# Patient Record
Sex: Female | Born: 1935 | Race: Black or African American | Hispanic: No | Smoking: Never smoker
Health system: Southern US, Community
[De-identification: ages and names within clinical notes are randomized; demographics above are authoritative.]

## PROBLEM LIST (undated history)

## (undated) DIAGNOSIS — I10 Essential (primary) hypertension: Secondary | ICD-10-CM

## (undated) DIAGNOSIS — D0510 Intraductal carcinoma in situ of unspecified breast: Principal | ICD-10-CM

## (undated) DIAGNOSIS — R011 Cardiac murmur, unspecified: Secondary | ICD-10-CM

## (undated) DIAGNOSIS — I4891 Unspecified atrial fibrillation: Secondary | ICD-10-CM

## (undated) DIAGNOSIS — E785 Hyperlipidemia, unspecified: Secondary | ICD-10-CM

## (undated) DIAGNOSIS — K922 Gastrointestinal hemorrhage, unspecified: Secondary | ICD-10-CM

## (undated) DIAGNOSIS — K8020 Calculus of gallbladder without cholecystitis without obstruction: Secondary | ICD-10-CM

## (undated) DIAGNOSIS — I509 Heart failure, unspecified: Secondary | ICD-10-CM

## (undated) DIAGNOSIS — I82409 Acute embolism and thrombosis of unspecified deep veins of unspecified lower extremity: Secondary | ICD-10-CM

## (undated) DIAGNOSIS — C50919 Malignant neoplasm of unspecified site of unspecified female breast: Secondary | ICD-10-CM

## (undated) DIAGNOSIS — R55 Syncope and collapse: Secondary | ICD-10-CM

## (undated) DIAGNOSIS — G629 Polyneuropathy, unspecified: Secondary | ICD-10-CM

## (undated) DIAGNOSIS — K219 Gastro-esophageal reflux disease without esophagitis: Secondary | ICD-10-CM

## (undated) DIAGNOSIS — D649 Anemia, unspecified: Secondary | ICD-10-CM

## (undated) DIAGNOSIS — IMO0001 Reserved for inherently not codable concepts without codable children: Secondary | ICD-10-CM

## (undated) DIAGNOSIS — M1990 Unspecified osteoarthritis, unspecified site: Secondary | ICD-10-CM

## (undated) HISTORY — DX: Cardiac murmur, unspecified: R01.1

## (undated) HISTORY — PX: BREAST SURGERY: SHX581

## (undated) HISTORY — DX: Intraductal carcinoma in situ of unspecified breast: D05.10

## (undated) HISTORY — DX: Anemia, unspecified: D64.9

## (undated) HISTORY — DX: Malignant neoplasm of unspecified site of unspecified female breast: C50.919

## (undated) HISTORY — DX: Gastrointestinal hemorrhage, unspecified: K92.2

## (undated) HISTORY — DX: Calculus of gallbladder without cholecystitis without obstruction: K80.20

---

## 1998-05-15 ENCOUNTER — Ambulatory Visit (HOSPITAL_COMMUNITY): Admission: RE | Admit: 1998-05-15 | Discharge: 1998-05-15 | Payer: Self-pay | Admitting: *Deleted

## 1998-12-27 ENCOUNTER — Ambulatory Visit (HOSPITAL_COMMUNITY): Admission: RE | Admit: 1998-12-27 | Discharge: 1998-12-27 | Payer: Self-pay | Admitting: *Deleted

## 1999-02-06 ENCOUNTER — Other Ambulatory Visit: Admission: RE | Admit: 1999-02-06 | Discharge: 1999-02-06 | Payer: Self-pay | Admitting: Obstetrics and Gynecology

## 2000-05-20 ENCOUNTER — Encounter: Payer: Self-pay | Admitting: *Deleted

## 2000-05-20 ENCOUNTER — Ambulatory Visit (HOSPITAL_COMMUNITY): Admission: RE | Admit: 2000-05-20 | Discharge: 2000-05-20 | Payer: Self-pay | Admitting: *Deleted

## 2001-05-28 ENCOUNTER — Ambulatory Visit (HOSPITAL_COMMUNITY): Admission: RE | Admit: 2001-05-28 | Discharge: 2001-05-28 | Payer: Self-pay | Admitting: *Deleted

## 2001-05-28 ENCOUNTER — Encounter: Payer: Self-pay | Admitting: *Deleted

## 2001-09-06 ENCOUNTER — Other Ambulatory Visit: Admission: RE | Admit: 2001-09-06 | Discharge: 2001-09-06 | Payer: Self-pay | Admitting: Obstetrics and Gynecology

## 2002-02-02 ENCOUNTER — Ambulatory Visit (HOSPITAL_COMMUNITY): Admission: RE | Admit: 2002-02-02 | Discharge: 2002-02-02 | Payer: Self-pay | Admitting: *Deleted

## 2002-05-31 ENCOUNTER — Encounter: Payer: Self-pay | Admitting: *Deleted

## 2002-05-31 ENCOUNTER — Ambulatory Visit (HOSPITAL_COMMUNITY): Admission: RE | Admit: 2002-05-31 | Discharge: 2002-05-31 | Payer: Self-pay | Admitting: *Deleted

## 2002-09-07 ENCOUNTER — Other Ambulatory Visit: Admission: RE | Admit: 2002-09-07 | Discharge: 2002-09-07 | Payer: Self-pay | Admitting: Obstetrics and Gynecology

## 2002-12-16 ENCOUNTER — Emergency Department (HOSPITAL_COMMUNITY): Admission: EM | Admit: 2002-12-16 | Discharge: 2002-12-17 | Payer: Self-pay | Admitting: Emergency Medicine

## 2002-12-16 ENCOUNTER — Encounter: Payer: Self-pay | Admitting: Emergency Medicine

## 2002-12-20 ENCOUNTER — Ambulatory Visit (HOSPITAL_COMMUNITY): Admission: RE | Admit: 2002-12-20 | Discharge: 2002-12-20 | Payer: Self-pay | Admitting: *Deleted

## 2003-06-15 ENCOUNTER — Ambulatory Visit (HOSPITAL_COMMUNITY): Admission: RE | Admit: 2003-06-15 | Discharge: 2003-06-15 | Payer: Self-pay | Admitting: *Deleted

## 2003-06-15 ENCOUNTER — Encounter: Payer: Self-pay | Admitting: *Deleted

## 2004-06-17 ENCOUNTER — Ambulatory Visit (HOSPITAL_COMMUNITY): Admission: RE | Admit: 2004-06-17 | Discharge: 2004-06-17 | Payer: Self-pay | Admitting: *Deleted

## 2004-11-26 ENCOUNTER — Encounter: Admission: RE | Admit: 2004-11-26 | Discharge: 2004-11-26 | Payer: Self-pay | Admitting: Cardiology

## 2005-01-23 ENCOUNTER — Ambulatory Visit (HOSPITAL_COMMUNITY): Admission: RE | Admit: 2005-01-23 | Discharge: 2005-01-23 | Payer: Self-pay | Admitting: Cardiology

## 2005-06-19 ENCOUNTER — Ambulatory Visit (HOSPITAL_COMMUNITY): Admission: RE | Admit: 2005-06-19 | Discharge: 2005-06-19 | Payer: Self-pay | Admitting: *Deleted

## 2005-09-29 ENCOUNTER — Encounter: Admission: RE | Admit: 2005-09-29 | Discharge: 2005-12-28 | Payer: Self-pay | Admitting: Cardiology

## 2006-01-12 ENCOUNTER — Encounter: Admission: RE | Admit: 2006-01-12 | Discharge: 2006-04-12 | Payer: Self-pay | Admitting: Cardiology

## 2006-06-24 ENCOUNTER — Ambulatory Visit (HOSPITAL_COMMUNITY): Admission: RE | Admit: 2006-06-24 | Discharge: 2006-06-24 | Payer: Self-pay | Admitting: Cardiology

## 2007-06-28 ENCOUNTER — Ambulatory Visit (HOSPITAL_COMMUNITY): Admission: RE | Admit: 2007-06-28 | Discharge: 2007-06-28 | Payer: Self-pay | Admitting: Cardiology

## 2007-09-24 ENCOUNTER — Ambulatory Visit: Payer: Self-pay | Admitting: Internal Medicine

## 2007-09-27 ENCOUNTER — Ambulatory Visit: Payer: Self-pay | Admitting: Vascular Surgery

## 2007-09-27 ENCOUNTER — Encounter (INDEPENDENT_AMBULATORY_CARE_PROVIDER_SITE_OTHER): Payer: Self-pay | Admitting: Cardiology

## 2007-09-27 ENCOUNTER — Ambulatory Visit (HOSPITAL_COMMUNITY): Admission: RE | Admit: 2007-09-27 | Discharge: 2007-09-27 | Payer: Self-pay | Admitting: Cardiology

## 2007-10-13 ENCOUNTER — Ambulatory Visit: Payer: Self-pay | Admitting: Internal Medicine

## 2007-12-24 ENCOUNTER — Emergency Department (HOSPITAL_COMMUNITY): Admission: EM | Admit: 2007-12-24 | Discharge: 2007-12-24 | Payer: Self-pay | Admitting: *Deleted

## 2007-12-26 ENCOUNTER — Emergency Department (HOSPITAL_COMMUNITY): Admission: EM | Admit: 2007-12-26 | Discharge: 2007-12-26 | Payer: Self-pay | Admitting: Emergency Medicine

## 2008-01-22 ENCOUNTER — Emergency Department (HOSPITAL_COMMUNITY): Admission: EM | Admit: 2008-01-22 | Discharge: 2008-01-22 | Payer: Self-pay | Admitting: Family Medicine

## 2008-02-11 ENCOUNTER — Ambulatory Visit: Payer: Self-pay | Admitting: Internal Medicine

## 2008-06-28 ENCOUNTER — Ambulatory Visit (HOSPITAL_COMMUNITY): Admission: RE | Admit: 2008-06-28 | Discharge: 2008-06-28 | Payer: Self-pay | Admitting: Cardiology

## 2009-02-09 ENCOUNTER — Ambulatory Visit: Payer: Self-pay | Admitting: Internal Medicine

## 2009-06-29 ENCOUNTER — Ambulatory Visit (HOSPITAL_COMMUNITY): Admission: RE | Admit: 2009-06-29 | Discharge: 2009-06-29 | Payer: Self-pay | Admitting: Cardiology

## 2010-02-08 DIAGNOSIS — I482 Chronic atrial fibrillation, unspecified: Secondary | ICD-10-CM

## 2010-02-08 DIAGNOSIS — E7800 Pure hypercholesterolemia, unspecified: Secondary | ICD-10-CM

## 2010-02-08 DIAGNOSIS — I119 Hypertensive heart disease without heart failure: Secondary | ICD-10-CM

## 2010-02-08 DIAGNOSIS — I340 Nonrheumatic mitral (valve) insufficiency: Secondary | ICD-10-CM

## 2010-02-11 ENCOUNTER — Ambulatory Visit: Payer: Self-pay | Admitting: Internal Medicine

## 2010-07-01 ENCOUNTER — Ambulatory Visit (HOSPITAL_COMMUNITY): Admission: RE | Admit: 2010-07-01 | Discharge: 2010-07-01 | Payer: Self-pay | Admitting: Cardiology

## 2010-07-03 ENCOUNTER — Encounter: Admission: RE | Admit: 2010-07-03 | Discharge: 2010-07-03 | Payer: Self-pay | Admitting: Cardiology

## 2010-07-04 ENCOUNTER — Encounter: Admission: RE | Admit: 2010-07-04 | Discharge: 2010-07-04 | Payer: Self-pay | Admitting: Cardiology

## 2010-07-11 ENCOUNTER — Encounter: Admission: RE | Admit: 2010-07-11 | Discharge: 2010-07-11 | Payer: Self-pay | Admitting: Cardiology

## 2010-07-15 ENCOUNTER — Ambulatory Visit (HOSPITAL_COMMUNITY): Admission: RE | Admit: 2010-07-15 | Discharge: 2010-07-15 | Payer: Self-pay | Admitting: General Surgery

## 2010-07-15 ENCOUNTER — Encounter: Admission: RE | Admit: 2010-07-15 | Discharge: 2010-07-15 | Payer: Self-pay | Admitting: General Surgery

## 2010-07-15 DIAGNOSIS — D0510 Intraductal carcinoma in situ of unspecified breast: Secondary | ICD-10-CM

## 2010-07-15 HISTORY — DX: Intraductal carcinoma in situ of unspecified breast: D05.10

## 2010-07-18 ENCOUNTER — Ambulatory Visit: Payer: Self-pay | Admitting: Oncology

## 2010-08-06 ENCOUNTER — Ambulatory Visit: Admission: RE | Admit: 2010-08-06 | Discharge: 2010-10-09 | Payer: Self-pay | Admitting: Radiation Oncology

## 2010-08-13 ENCOUNTER — Encounter: Admission: RE | Admit: 2010-08-13 | Discharge: 2010-08-13 | Payer: Self-pay | Admitting: Radiation Oncology

## 2010-10-17 ENCOUNTER — Ambulatory Visit: Payer: Self-pay | Admitting: Oncology

## 2010-10-21 LAB — CBC WITH DIFFERENTIAL/PLATELET
BASO%: 0.4 % (ref 0.0–2.0)
EOS%: 4.3 % (ref 0.0–7.0)
HCT: 36.5 % (ref 34.8–46.6)
HGB: 12.6 g/dL (ref 11.6–15.9)
MCV: 97.1 fL (ref 79.5–101.0)
MONO#: 1 10*3/uL — ABNORMAL HIGH (ref 0.1–0.9)
NEUT#: 2.9 10*3/uL (ref 1.5–6.5)
Platelets: 158 10*3/uL (ref 145–400)
WBC: 5.5 10*3/uL (ref 3.9–10.3)
lymph#: 1.4 10*3/uL (ref 0.9–3.3)

## 2010-10-21 LAB — COMPREHENSIVE METABOLIC PANEL
ALT: 9 U/L (ref 0–35)
AST: 20 U/L (ref 0–37)
Alkaline Phosphatase: 96 U/L (ref 39–117)
CO2: 28 mEq/L (ref 19–32)
Chloride: 100 mEq/L (ref 96–112)
Potassium: 4 mEq/L (ref 3.5–5.3)
Sodium: 137 mEq/L (ref 135–145)
Total Bilirubin: 0.8 mg/dL (ref 0.3–1.2)
Total Protein: 7.5 g/dL (ref 6.0–8.3)

## 2010-11-13 ENCOUNTER — Observation Stay (HOSPITAL_COMMUNITY)
Admission: EM | Admit: 2010-11-13 | Discharge: 2010-11-14 | Payer: Self-pay | Source: Home / Self Care | Admitting: Emergency Medicine

## 2010-11-14 ENCOUNTER — Encounter (INDEPENDENT_AMBULATORY_CARE_PROVIDER_SITE_OTHER): Payer: Self-pay | Admitting: Emergency Medicine

## 2010-11-20 ENCOUNTER — Ambulatory Visit: Payer: Self-pay | Admitting: Oncology

## 2010-11-20 LAB — COMPREHENSIVE METABOLIC PANEL
ALT: 11 U/L (ref 0–35)
AST: 20 U/L (ref 0–37)
Albumin: 4 g/dL (ref 3.5–5.2)
Calcium: 9.2 mg/dL (ref 8.4–10.5)
Sodium: 139 mEq/L (ref 135–145)
Total Protein: 7.5 g/dL (ref 6.0–8.3)

## 2010-11-20 LAB — CBC WITH DIFFERENTIAL/PLATELET
BASO%: 0.4 % (ref 0.0–2.0)
Basophils Absolute: 0 10*3/uL (ref 0.0–0.1)
EOS%: 3.6 % (ref 0.0–7.0)
LYMPH%: 41 % (ref 14.0–49.7)
MONO#: 0.7 10*3/uL (ref 0.1–0.9)
MONO%: 10.9 % (ref 0.0–14.0)
NEUT#: 2.8 10*3/uL (ref 1.5–6.5)
RBC: 3.81 10*6/uL (ref 3.70–5.45)
RDW: 12.3 % (ref 11.2–14.5)
lymph#: 2.6 10*3/uL (ref 0.9–3.3)

## 2010-12-08 DIAGNOSIS — C50919 Malignant neoplasm of unspecified site of unspecified female breast: Secondary | ICD-10-CM

## 2010-12-08 HISTORY — DX: Malignant neoplasm of unspecified site of unspecified female breast: C50.919

## 2010-12-18 LAB — COMPREHENSIVE METABOLIC PANEL
ALT: 10 U/L (ref 0–35)
AST: 18 U/L (ref 0–37)
Albumin: 3.8 g/dL (ref 3.5–5.2)
Alkaline Phosphatase: 72 U/L (ref 39–117)
BUN: 10 mg/dL (ref 6–23)
CO2: 27 mEq/L (ref 19–32)
Calcium: 8.6 mg/dL (ref 8.4–10.5)
Chloride: 100 mEq/L (ref 96–112)
Creatinine, Ser: 0.95 mg/dL (ref 0.40–1.20)
Glucose, Bld: 218 mg/dL — ABNORMAL HIGH (ref 70–99)
Potassium: 4 mEq/L (ref 3.5–5.3)
Sodium: 138 mEq/L (ref 135–145)
Total Bilirubin: 0.9 mg/dL (ref 0.3–1.2)
Total Protein: 7.4 g/dL (ref 6.0–8.3)

## 2010-12-18 LAB — CBC WITH DIFFERENTIAL/PLATELET
BASO%: 0.3 % (ref 0.0–2.0)
Basophils Absolute: 0 10*3/uL (ref 0.0–0.1)
EOS%: 4.3 % (ref 0.0–7.0)
Eosinophils Absolute: 0.3 10*3/uL (ref 0.0–0.5)
HCT: 35.8 % (ref 34.8–46.6)
HGB: 11.8 g/dL (ref 11.6–15.9)
LYMPH%: 26 % (ref 14.0–49.7)
MCH: 32.4 pg (ref 25.1–34.0)
MCHC: 33 g/dL (ref 31.5–36.0)
MCV: 98 fL (ref 79.5–101.0)
MONO#: 0.7 10*3/uL (ref 0.1–0.9)
MONO%: 11.4 % (ref 0.0–14.0)
NEUT#: 3.6 10*3/uL (ref 1.5–6.5)
NEUT%: 58 % (ref 38.4–76.8)
Platelets: 152 10*3/uL (ref 145–400)
RBC: 3.65 10*6/uL — ABNORMAL LOW (ref 3.70–5.45)
RDW: 12.7 % (ref 11.2–14.5)
WBC: 6.3 10*3/uL (ref 3.9–10.3)
lymph#: 1.6 10*3/uL (ref 0.9–3.3)

## 2010-12-31 ENCOUNTER — Ambulatory Visit: Payer: Self-pay | Admitting: Oncology

## 2011-01-09 NOTE — Assessment & Plan Note (Signed)
Summary: 1 YR F/U  Medications Added FUROSEMIDE 40 MG TABS (FUROSEMIDE) Take one tablet by mouth two times a day EXFORGE 5-320 MG TABS (AMLODIPINE BESYLATE-VALSARTAN) Take one tablet by mouth once daily. ATENOLOL 50 MG TABS (ATENOLOL) Take one tablet by mouth once daily. SIMVASTATIN 40 MG TABS (SIMVASTATIN) Take one tablet by mouth daily at bedtime WARFARIN SODIUM 2 MG TABS (WARFARIN SODIUM) Use as directed by [facility]tico[facility]ualtion Clinic POTASSIUM CHLORIDE CRYS CR 20 MEQ CR-TABS (POTASSIUM CHLORIDE CRYS CR) Take one tablet by mouth twice a day ASPIRIN EC 325 MG TBEC (ASPIRIN) Take one tablet by mouth daily CELEBREX 200 MG CAPS (CELECOXIB) 1 capsule once daily MULTIVITAMINS   TABS (MULTIPLE VITAMIN) Take one tablet by mouth once daily. LANOXIN 0.125 MG TABS (DIGOXIN) one by mouth daily      Allergies Added: NKDA  Visit Type:  Follow-up Primary Provider:  Donia Guiles, MD   History of Present Illness: [name] returns today for followup.  She is a very pleasant 77 year old woman with chronic atrial fibrillation and hypertension, and morbid obesity.  She returns today for followup.  In the interim, she has been stable, and has lost 8 lbs.  Her heart failure has been well controlled.  She has rare palpitations. She denies chest pain. No peripheral edema.   Current Medications (verified): 1)  Furosemide 40 Mg Tabs (Furosemide) .... Take One Tablet By Mouth Two Times A Day 2)  Exforge 5-320 Mg Tabs (Amlodipine Besylate-Valsartan) .... Take One Tablet By Mouth Once Daily. 3)  Atenolol 50 Mg Tabs (Atenolol) .... Take One Tablet By Mouth Once Daily. 4)  Simvastatin 40 Mg Tabs (Simvastatin) .... Take One Tablet By Mouth Daily At Bedtime 5)  Warfarin Sodium 2 Mg Tabs (Warfarin Sodium) .... Use As Directed By Anticoagualtion Clinic 6)  Potassium Chloride Crys Cr 20 Meq Cr-Tabs (Potassium Chloride Crys Cr) .... Take One Tablet By Mouth Twice A Day 7)  Aspirin Ec 325 Mg Tbec (Aspirin) .... Take  One Tablet By Mouth Daily 8)  Celebrex 200 Mg Caps (Celecoxib) .Marland Kitchen.. 1 Capsule Once Daily 9)  Multivitamins   Tabs (Multiple Vitamin) .... Take One Tablet By Mouth Once Daily. 10)  Lanoxin 0.125 Mg Tabs (Digoxin) .... One By Mouth Daily  Allergies (verified): No Known Drug Allergies  Past History:  Past Medical History: Last updated: 02/08/2010  Chronic atrial fibrillation   hypertension   heart murmur   hypercholesterolemia  Review of Systems  The patient denies chest pain, syncope, dyspnea on exertion, and peripheral edema.    Vital Signs:  Patient profile:   77 year old female Height:      63 inches Weight:      247 pounds BMI:     43.91 Pulse rate:   55 / minute BP sitting:   110 / 68  (left arm)  Vitals Entered By: Laurance Flatten CMA (February 11, 2010 10:59 AM)  Physical Exam  General:  Obese, well developed, well nourished, in no acute distress.  HEENT: normal Neck: supple. No JVD. Carotids 2+ bilaterally no bruits Cor: RRR no rubs, gallops or murmur Lungs: CTA Ab: soft, nontender. nondistended. No HSM. Good bowel sounds Ext: warm. no cyanosis, clubbing or edema Neuro: alert and oriented. Grossly nonfocal. affect pleasant    EKG  Procedure date:  02/11/2010  Findings:      Atrial fibrillation with a controlled ventricular response rate of: 55.  Impression & Recommendations:  Problem # 1:  ATRIAL FIBRILLATION, CHRONIC (ICD-427.31) Her symptoms  are well controlled.  Continue current meds. Her updated medication list for this problem includes:    Atenolol 50 Mg Tabs (Atenolol) .Marland Kitchen... Take one tablet by mouth once daily.    Warfarin Sodium 2 Mg Tabs (Warfarin sodium) ..... Use as directed by anticoagualtion clinic    Aspirin Ec 325 Mg Tbec (Aspirin) .Marland Kitchen... Take one tablet by mouth daily    Lanoxin 0.125 Mg Tabs (Digoxin) ..... One by mouth daily  Orders: EKG w/ Interpretation (93000)  Problem # 2:  HYPERTENSION (ICD-401.9) In addition to her current meds,  she will maintain a low sodium diet. Her updated medication list for this problem includes:    Furosemide 40 Mg Tabs (Furosemide) .Marland Kitchen... Take one tablet by mouth two times a day    Exforge 5-320 Mg Tabs (Amlodipine besylate-valsartan) .Marland Kitchen... Take one tablet by mouth once daily.    Atenolol 50 Mg Tabs (Atenolol) .Marland Kitchen... Take one tablet by mouth once daily.    Aspirin Ec 325 Mg Tbec (Aspirin) .Marland Kitchen... Take one tablet by mouth daily  Problem # 3:  HYPERCHOLESTEROLEMIA (ICD-272.0) Continue statin therapy and maintain a low fat diet. Her updated medication list for this problem includes:    Simvastatin 40 Mg Tabs (Simvastatin) .Marland Kitchen... Take one tablet by mouth daily at bedtime  Patient Instructions: 1)  Your physician recommends that you schedule a follow-up appointment in: 12 months with Dr Ladona Ridgel Prescriptions: LANOXIN 0.125 MG TABS (DIGOXIN) one by mouth daily  #30 x 11   Entered by:   Dennis Bast, RN, BSN   Authorized by:   Laren Boom, MD, [facility]   Signed by:   Dennis Bast, RN, BSN on 02/11/2010   Method used:   Faxed to ...       [facility]retail)       [address]       [place], [place]  [address]       Ph: [phone]       Fax: [phone]   RxID:   [id number] ATENOLOL 50 MG TABS (ATENOLOL) Take one tablet by mouth once daily.  #30 x 11   Entered by:   Dennis Bast, RN, BSN   Authorized by:   Laren Boom, MD, [facility]   Signed by:   Dennis Bast, RN, BSN on 02/11/2010   Method used:   Faxed to ...       [facility]ail[facility]       [address]       [place], [place]  [address]       Ph: [phone]       Fax: [phone]   RxID:   [id number]

## 2011-02-17 LAB — CBC
HCT: 37.1 % (ref 36.0–46.0)
MCH: 32.6 pg (ref 26.0–34.0)
MCHC: 33.7 g/dL (ref 30.0–36.0)
Platelets: 151 10*3/uL (ref 150–400)
RDW: 11.8 % (ref 11.5–15.5)

## 2011-02-17 LAB — APTT: aPTT: 31 seconds (ref 24–37)

## 2011-02-17 LAB — POCT I-STAT, CHEM 8
Calcium, Ion: 1.05 mmol/L — ABNORMAL LOW (ref 1.12–1.32)
Hemoglobin: 13.6 g/dL (ref 12.0–15.0)
Sodium: 143 mEq/L (ref 135–145)
TCO2: 30 mmol/L (ref 0–100)

## 2011-02-17 LAB — PROTIME-INR
INR: 1.43 (ref 0.00–1.49)
Prothrombin Time: 17.6 seconds — ABNORMAL HIGH (ref 11.6–15.2)

## 2011-02-17 LAB — DIFFERENTIAL
Basophils Absolute: 0 10*3/uL (ref 0.0–0.1)
Basophils Relative: 0 % (ref 0–1)
Lymphocytes Relative: 46 % (ref 12–46)
Neutro Abs: 2.6 10*3/uL (ref 1.7–7.7)
Neutrophils Relative %: 38 % — ABNORMAL LOW (ref 43–77)

## 2011-02-17 LAB — POCT CARDIAC MARKERS: Myoglobin, poc: 89 ng/mL (ref 12–200)

## 2011-02-21 LAB — GLUCOSE, CAPILLARY
Glucose-Capillary: 155 mg/dL — ABNORMAL HIGH (ref 70–99)
Glucose-Capillary: 177 mg/dL — ABNORMAL HIGH (ref 70–99)
Glucose-Capillary: 184 mg/dL — ABNORMAL HIGH (ref 70–99)

## 2011-02-21 LAB — CBC
HCT: 39.9 % (ref 36.0–46.0)
MCH: 31.9 pg (ref 26.0–34.0)
MCV: 96.4 fL (ref 78.0–100.0)
Platelets: 149 10*3/uL — ABNORMAL LOW (ref 150–400)
RDW: 12.1 % (ref 11.5–15.5)

## 2011-02-21 LAB — BASIC METABOLIC PANEL
Calcium: 8.8 mg/dL (ref 8.4–10.5)
GFR calc Af Amer: >60 mL/min (ref >60)
GFR calc non Af Amer: 54 mL/min — ABNORMAL LOW (ref >60)
Glucose, Bld: 221 mg/dL — ABNORMAL HIGH (ref 70–99)
Potassium: 4.2 mEq/L (ref 3.5–5.1)
Sodium: 136 mEq/L (ref 135–145)

## 2011-02-21 LAB — APTT: aPTT: 30 seconds (ref 24–37)

## 2011-02-21 LAB — SURGICAL PCR SCREEN: Staphylococcus aureus: NEGATIVE

## 2011-04-22 NOTE — Letter (Signed)
September 24, 2007    Osvaldo Shipper. Spruill, M.D.  [address] [address]  [place], [place] [address]   RE:  ELONA, YINGER  MRN:  [id number]  /  DOB:  1926/12/08   Dear Kevin Fenton,   Thank you for referring Ms. Bernadene Person for EP evaluation of her  atrial fibrillation.  As you know, she is a very pleasant widowed 76-  year-old woman with a history of obesity, hypertension, diabetes, and  recent diagnosis of atrial fibrillation.  She, despite her atrial  fibrillation, has had a fairly rapid ventricular response.  Despite  this, she has been relatively asymptomatic.  She states that when she  exerts herself, particularly when she climbs up stairs, she gets short  of breath.  If she is in a hurry, she gets short of breath but otherwise  no specific complaints today.  I suspect she is fairly sedentary.  I do  not have her records today, but is sounds like she was tried on Coumadin  but had some problems either with compliance or persistently high INRs,  and this medication was discontinued.  She is presently on furosemide 40  twice daily which has helped her sensation of dyspnea, but she is not on  beta blockers.  She had been on Celebrex which was discontinued.  She is  on ibuprofen.  Her exam was unremarkable except that she was obese.  She  had mild peripheral edema.  Her blood pressure was significantly  elevated at 177/86.  Her ventricular rate was 140 beats per minute in  atrial fibrillation at rest.   Kevin Fenton, I discussed treatment options with Ms. Traeger.  Ideally, she  would be able to tolerate Coumadin, although it is not clear to me what  exactly happened.  I wonder if we should reconsider another trial of  Coumadin on this patient; however, until I speak with you, I will plan  on starting her on aspirin 325 a day.  With regard to her rate control,  I think she needs digoxin and metoprolol, and I have given her a  prescription for metoprolol and digoxin today.  My plan is  to see her back  in 2-3 weeks to see how her ventricular rate is in  atrial fibrillation.  If it is approved and she feels better, then we  will continue as we are.  If she is still symptomatic, then we will try  additional up-titrations of her beta blocker and digoxin.  We will also  want to revisit the question of Coumadin.    Sincerely,      Doylene Canning. Ladona Ridgel, MD  Electronically Signed    GWT/MedQ  DD: 09/24/2007  DT: 09/25/2007  Job #: [id number]

## 2011-04-22 NOTE — Assessment & Plan Note (Signed)
[facility]                         ELECTROPHYSIOLOGY OFFICE NOTE   [name], [name]                    MRN:          [id number]  DATE:02/09/2009                            DOB:          [birth year]    [name] returns today for followup.  She is a very pleasant 77-year-  old woman with chronic atrial fibrillation and hypertension, and morbid  obesity.  She returns today for followup.  In the interim, she has been  stable, but she expresses frustration about inability to lose weight.  Her heart failure has been well controlled.  She has rare palpitations.  She denies chest pain.   MEDICATIONS:  1. Exforge 5/320 daily.  2. Potassium 40 twice a day.  3. Coumadin as directed.  4. Aspirin 325 a day.  5. Digoxin 0.125 daily.  6. Potassium 20 twice a day.  7. Simvastatin 40 a day.  8. Atenolol 50 a day.  9. Celebrex 200 a day.   PHYSICAL EXAMINATION:  GENERAL:  She is a pleasant but obese elderly  woman in no distress.  VITAL SIGNS:  Blood pressure was 130/60, the pulse was 60 and irregular,  respirations were 18, her weight was 253 pounds.  NECK:  No jugular venous distention.  LUNGS:  Clear bilaterally to auscultation.  No wheezes, rales, or  rhonchi are present.  There is no increased work of breathing.  CARDIOVASCULAR:  Irregular rate and rhythm.  Normal S1 and S2.  I did  not appreciate any murmurs today.  PMI was enlarged but not laterally  displaced.  ABDOMEN:  Obese, nontender, nondistended.  There is no organomegaly.  Bowel sounds are present.  There is no rebound or guarding.  EXTREMITIES:  No cyanosis, clubbing, or edema.  Pulses were 2+ and  symmetric.   EKG demonstrates atrial fibrillation with controlled ventricular  response.   IMPRESSION:  1. Chronic atrial fibrillation.  2. Hypertension.  3. Morbid obesity.  4. Dyslipidemia.   DISCUSSION:  [name] is stable today from arrhythmia perspective.  She has no  symptoms with her AFib.  Her blood pressure has been well  controlled.  She will continue on  chronic Coumadin therapy for thromboembolic prevention.  She will  continue on her statin therapy for control her lipids.  We will see her  back in the office for followup in 1 year.     Doylene Canning. Ladona Ridgel, MD  Electronically Signed    GWT/MedQ  DD: 02/09/2009  DT: 02/10/2009  Job #: [id number]   cc:   Osvaldo Shipper. Spruill, M.D.

## 2011-04-22 NOTE — Assessment & Plan Note (Signed)
[facility]                         ELECTROPHYSIOLOGY OFFICE NOTE   EVAMAE, ROWEN                    MRN:          [id number]  DATE:02/11/2008                            DOB:          [birth year]    Ms. Scheck returns today for follow-up.  She is a very pleasant, 77  year old woman with a history of atrial fibrillation now well-controlled  with rate controlling drugs, who also has heart failure who returns  today for follow-up.  When I saw her initially back in October 2008 she  was suffering from rapid A fib  and congestive heart failure symptoms  secondary to this.  At that time we started on AV nodal blocking drugs  include metoprolol and digoxin and she is markedly improved.  She denies  chest pain or shortness of breath.  She has very minimal palpitations.  She has had no syncope or near syncope and overall is improved on exam.  Her medications include simvastatin 40 a day, potassium 20 twice daily,  digoxin 0.125 daily, metoprolol ER 50 a day, Coumadin as directed,  aspirin 325 a day, furosemide 40 twice a day.   On physical exam she is a pleasant elderly-appearing woman in no acute  distress.  Blood pressure was 141/86, pulse 64 and regular, respirations  were 18.  Weight was 256 pounds NECK:  Revealed no jugular venous  distention.  LUNGS:  Clear bilaterally to auscultation.  No wheezes, rales or rhonchi  are present.  CARDIOVASCULAR:  Demonstrate irregular rate and rhythm  with normal S1-S2.  EXTREMITIES:  Demonstrated no edema.   EKG demonstrates atrial fibrillation with a slow ventricular response of  55 beats per minute.   IMPRESSION:  1. Chronic A fib  2. Rate control with beta blockers and digoxin.  3. Chronic Coumadin therapy  4. Hypertension.   DISCUSSION:  Overall Ms. Luth is stable and her ventricular rate and  A fib is well-controlled.  I recommend continued rate control for the  patient, continued Coumadin  therapy and blood pressure control.  I will  see her back in 1 year.     Doylene Canning. Ladona Ridgel, MD  Electronically Signed    GWT/MedQ  DD: 02/11/2008  DT: 02/12/2008  Job #: [id number]   cc:   Osvaldo Shipper. Spruill, M.D.

## 2011-04-22 NOTE — Assessment & Plan Note (Signed)
[facility]                         ELECTROPHYSIOLOGY OFFICE NOTE   EVANNY, ELLERBE                    MRN:          [id number]  DATE:09/24/2007                            DOB:          1926-07-11    Ms. Ackerley is referred today by Dr. Donia Guiles for evaluation of  atrial fibrillation with a rapid ventricular response.  The patient is a  very pleasant 77 year old woman with multiple medical problems including  diet controlled diabetes, hypertension, and dyslipidemia, who was found  to be in atrial fibrillation several weeks ago.  She was initially  placed on Coumadin, but it sounds like she was not having good success  with control of her INR, and ultimately Coumadin was discontinued.  The  patient has not been on any beta blockers, at least as best I can tell.  She notes that when she is resting or doing something very sedentary in  activity or strenuousness, she feels fine but when she does anything  strenuous or exerts herself, she has very difficult problems with  shortness of breath, palpitations.  Otherwise, she had no specific  complaints except for very mild peripheral edema.  She has chronic  arthritic complaints as well and is on nonsteroidals for this.   FAMILY HISTORY:  Noncontributory at her advanced age.   SOCIAL HISTORY:  The patient denies tobacco use.  She has remote alcohol  use.  She is retired.  She is widowed.   REVIEW OF SYSTEMS:  As noted in the HPI.  There is a question of  decreased visual acuity but otherwise all systems reviewed and found to  be negative.   PHYSICAL EXAMINATION:  She is a pleasant, obese 77 year old woman in no  distress.  The blood pressure was 177/86; the pulse 78 and irregularly irregular  (12-lead EKG rate was 140 beats per minute); the respirations were 16;  weight was 256 pounds.  HEENT:  Normocephalic, atraumatic.  Pupils equal and round.  The  oropharynx is moist, the sclerae  anicteric.  NECK:  Revealed 8 to 9 cm of jugular venous distention.  No thyromegaly.  LUNGS:  Clear bilaterally to auscultation.  No wheezes, rales, or  rhonchi are present.  CARDIOVASCULAR:  Irregularly irregular tachycardia with normal S1 and  S2.  The heart sounds were somewhat distant.  I could not appreciate any  murmurs or rubs.  ABDOMEN:  Obese, nontender, nondistended.  There is no organomegaly.  The bowel sounds are present.  There is no rebound or guarding.  EXTREMITIES:  No cyanosis, clubbing, or edema.  The pulses were 2+ and  symmetric.  NEUROLOGIC:  Alert and oriented x3 with cranial nerves intact.  The  strength was 5/5 and symmetric.   The EKG demonstrates atrial fibrillation with a rapid ventricular  response at 140 beats per minute.   IMPRESSION:  1. Persistent atrial fibrillation with rapid ventricular response.  2. Hypertension.  3. Diabetes.  4. Dyslipidemia.   DISCUSSION:  I have discussed treatment options with the patient in  detail.  I recommended a strategy of rate control and to this end I have  given her prescriptions for Toprol as well as digoxin today.  I  initially thought Coumadin would be most appropriate.  It sounds like  she has been tried on Coumadin and not been very therapeutic with her  INRs.  I will have to find out more from Dr. Shana Chute regarding this.  In  the short term, I have given her a prescription for full strength  aspirin that I would like her to take, and I have asked her to cut back  on her ibuprofen intake.  We will see her back in several weeks to see  how her ventricular response is doing in atrial fibrillation.     Doylene Canning. Ladona Ridgel, MD  Electronically Signed    GWT/MedQ  DD: 09/24/2007  DT: 09/25/2007  Job #: [id number]   cc:   Osvaldo Shipper. Spruill, M.D.

## 2011-04-22 NOTE — Assessment & Plan Note (Signed)
[facility]                         ELECTROPHYSIOLOGY OFFICE NOTE   Diane, Dominguez                    MRN:          [id number]  DATE:10/13/2007                            DOB:          September 15, 1926    [name] returns today for follow-up.  I saw her several weeks ago  on referral from Verona O. Spruill, M.D., for atrial fibrillation with a  rapid ventricular response.  The patient is a very pleasant elderly  woman with a history of hypertension and her only symptom initially was  that she would get tired easily.  She denied palpitations or shortness  of breath.  The patient was begun on metoprolol long-acting 50 mg daily  along with digoxin 0.125 mg daily, and she returns today for follow-up.  I should also note that she was started back on Coumadin.  Overall she  notes that her fatigue and tiredness has markedly improved.  She has had  no palpitations.  She denies syncope.  She denies chest pain or  shortness of breath.   PHYSICAL EXAMINATION:  She is a pleasant, elderly-appearing woman in no  acute distress.  The blood pressure today was 130/87, the pulse was 53 and irregularly  irregular, respirations were 18.  NECK:  No jugular venous distention.  LUNGS:  Clear bilaterally to auscultation.  No wheezes, rales or rhonchi  were present.  CARDIOVASCULAR:  Ann irregularly irregular rhythm with normal S1 and S2.  EXTREMITIES:  No edema.   IMPRESSION:  1. Persistent atrial fibrillation.  2. Hypertension.  3. Coumadin therapy.   DISCUSSION:  [name] ventricular rate is much improved today  compared to when she was seen by me in the office 3 weeks ago.  I have  recommended that she continue on her present medical therapy with beta  blockers and digoxin and I think it would be important that she stay on  Coumadin if at all possible.  Her symptoms of atrial fibrillation have  resolved completely.  I would not recommend antiarrhythmic  drug therapy  at this time.  She will continue her beta blocker and digoxin.  We will  see her back in several months.     Doylene Canning. Ladona Ridgel, MD  Electronically Signed    GWT/MedQ  DD: 10/13/2007  DT: 10/14/2007  Job #: [id number]   cc:   Osvaldo Shipper. Spruill, M.D.

## 2011-04-25 NOTE — Op Note (Signed)
NAME:  [name], [name]                       ACCOUNT NO.:  [id number]   MEDICAL RECORD NO.:  [id number]                   PATIENT TYPE:  AMB   LOCATION:  ENDO                                 FACILITY:  [facility]   PHYSICIAN:  Sharyn Dross., M.D.               DATE OF BIRTH:  1926-03-03   DATE OF PROCEDURE:  12/20/2002  DATE OF DISCHARGE:                                 OPERATIVE REPORT   PREOPERATIVE DIAGNOSES:  1. Epigastric and distal esophageal pain.  2. Elevated amylase level and an abnormal x-ray consistent with gallstones.   MEDICATIONS:  Demerol 40 mg IV, Versed 4 mg IV over a 10-minute period of  time.   INSTRUMENT USED:  Olympus video panendoscope.   ENDOSCOPIST:  Dortha Kern, M.D.   CLINICAL NOTE:  See the accompanying note for the history and physical.   INFORMED CONSENT:  The patient was advised of the procedure, the  indications, and the risks involved.  She has agreed to have the procedure  performed after viewing the video at this time.   PREPROCEDURE PREPARATION:  The patient was brought into the [facility]  endoscopy unit, where the IV for IV sedating medications was started.  A  monitor was placed on the patient to monitor the patient's vital signs and  oxygen saturation.  Nasal oxygen at 2 L/min. was used and after adequate  sedation was performed, the procedure as begun.   DESCRIPTION OF PROCEDURE:  The instrument was advanced with the patient  lying in the left lateral position via the direct technique without  difficulty.  The oropharyngeal, epiglottis, vocal cords, and piriform  sinuses appeared to be grossly within normal limits.  The esophagus showed  no evidence of acute inflammation or ulcerations in the proximal and  midportions of the esophagus at this time.  However, in the distal  esophagus, mid-lower esophagus sphincter area, there was evidence of a  Mallory-Weiss tear with evidence of old blood  on scarring that was noted.  A photograph was  attempted to be taken; however, upon reviewing the  photographs, this did not appear well at all.  There was no evidence of any  active bleeding that was present in the area at this time.  The size of the  scar appeared to be approximately 1-2 cm in length at this time.   The gastric area showed a normal mucous lake without any evidence of acute  inflammation or ulcerations that was noted.  The antral area appeared to be  unremarkable without any major acute inflammatory changes or ulcerative  changes that was appreciated.  The pylorus was normal and upon advancing  through the pyloric canal, the duodenal bulb showed evidence of an active  duodenal ulcer that was present with a small amount of residual old heme  that was present.  There was no active bleeding present and no evidence of  any visible vessel that was  noted within the ulcer crater that was  appreciated.  The ulcer size appeared to be about 1 cm in size at this point  without any major deformities of the duodenal bulb (except for the ulcer  crater) that was noted.  The second portion of the duodenum appeared to be  otherwise unremarkable at this time.  There was no evidence of any  stimulation of bleeding or irritation that was present at this time.   The instrument was retracted back, where a retroflexed view of the cardia  showed no evidence of a hiatal hernia that was noted at this time.  The  instrument was then advanced back to the antral area, where a biopsy for the  CLO study was performed.  No biopsies were taken of the duodenal ulcer that  was noted.  The instrument was gradually retracted back, where the Z-line  appeared to be 40 cm distal esophagus at this time.  Again the instrument  was eased back into the esophagus, where there was evidence of old blood  that was present, and a photograph was taken of this without any active  bleeding noted.  Again the area where the Mallory-Weiss tear was not well-  noted  because the area contracted down.  The instrument was gradually  removed with the patient having some intolerance to the procedure but not  enough resistance to force her to pull the instrument out at this time.  The  patient tolerated the procedure overall well without any known major  discomfort to this point.   TREATMENT:  1. Presently I am going to await the results of the CLO study at this time.  2. I am going to start the patient on a PPI at this time.  3. I am going to have her follow up with me in approximately two weeks for a     re-evaluation in the office and will discuss this with her at this point.     Depending on these results will determine the course of therapy.                                               Sharyn Dross., M.D.    JM/MEDQ  D:  12/20/2002  T:  12/20/2002  Job:  [id number]

## 2011-05-19 ENCOUNTER — Other Ambulatory Visit (HOSPITAL_COMMUNITY): Payer: Self-pay | Admitting: Cardiology

## 2011-06-06 ENCOUNTER — Ambulatory Visit (HOSPITAL_COMMUNITY)
Admission: RE | Admit: 2011-06-06 | Discharge: 2011-06-06 | Payer: Self-pay | Source: Ambulatory Visit | Attending: Cardiology | Admitting: Cardiology

## 2011-06-06 ENCOUNTER — Encounter (HOSPITAL_COMMUNITY)
Admission: RE | Admit: 2011-06-06 | Discharge: 2011-06-06 | Disposition: A | Discharge status: Home / Self Care | Payer: Medicare Other | Source: Ambulatory Visit | Attending: Cardiology | Admitting: Cardiology

## 2011-06-06 ENCOUNTER — Ambulatory Visit (HOSPITAL_COMMUNITY): Payer: Self-pay

## 2011-06-06 DIAGNOSIS — I4891 Unspecified atrial fibrillation: Secondary | ICD-10-CM | POA: Insufficient documentation

## 2011-06-06 DIAGNOSIS — R079 Chest pain, unspecified: Secondary | ICD-10-CM | POA: Insufficient documentation

## 2011-06-06 DIAGNOSIS — Z09 Encounter for follow-up examination after completed treatment for conditions other than malignant neoplasm: Secondary | ICD-10-CM | POA: Insufficient documentation

## 2011-06-06 MED ORDER — TECHNETIUM TC 99M TETROFOSMIN IV KIT
30.0000 | PACK | Freq: Once | INTRAVENOUS | Status: AC | PRN
  Administered 2011-06-06: 30 via INTRAVENOUS

## 2011-06-06 MED ORDER — TECHNETIUM TC 99M TETROFOSMIN IV KIT
10.0000 | PACK | Freq: Once | INTRAVENOUS | Status: AC | PRN
  Administered 2011-06-06: 10 via INTRAVENOUS

## 2011-06-19 ENCOUNTER — Encounter (HOSPITAL_BASED_OUTPATIENT_CLINIC_OR_DEPARTMENT_OTHER): Payer: Medicare Other | Admitting: Oncology

## 2011-06-19 ENCOUNTER — Other Ambulatory Visit: Payer: Self-pay | Admitting: Oncology

## 2011-06-19 DIAGNOSIS — D0590 Unspecified type of carcinoma in situ of unspecified breast: Secondary | ICD-10-CM

## 2011-06-19 DIAGNOSIS — M129 Arthropathy, unspecified: Secondary | ICD-10-CM

## 2011-06-19 LAB — CBC WITH DIFFERENTIAL/PLATELET
BASO%: 0.5 % (ref 0.0–2.0)
EOS%: 3.4 % (ref 0.0–7.0)
HGB: 11.9 g/dL (ref 11.6–15.9)
MCH: 33.2 pg (ref 25.1–34.0)
MCHC: 34.1 g/dL (ref 31.5–36.0)
MCV: 97.2 fL (ref 79.5–101.0)
MONO%: 12.2 % (ref 0.0–14.0)
RBC: 3.6 10*6/uL — ABNORMAL LOW (ref 3.70–5.45)
RDW: 12.4 % (ref 11.2–14.5)
lymph#: 2.1 10*3/uL (ref 0.9–3.3)

## 2011-06-19 LAB — COMPREHENSIVE METABOLIC PANEL
ALT: 13 U/L (ref 0–35)
AST: 22 U/L (ref 0–37)
Albumin: 4 g/dL (ref 3.5–5.2)
Alkaline Phosphatase: 85 U/L (ref 39–117)
BUN: 13 mg/dL (ref 6–23)
Calcium: 9.4 mg/dL (ref 8.4–10.5)
Chloride: 103 mEq/L (ref 96–112)
Potassium: 4.9 mEq/L (ref 3.5–5.3)
Sodium: 141 mEq/L (ref 135–145)
Total Protein: 7.6 g/dL (ref 6.0–8.3)

## 2011-06-23 ENCOUNTER — Other Ambulatory Visit: Payer: Self-pay | Admitting: Oncology

## 2011-06-23 DIAGNOSIS — Z9889 Other specified postprocedural states: Secondary | ICD-10-CM

## 2011-07-04 ENCOUNTER — Ambulatory Visit
Admission: RE | Admit: 2011-07-04 | Discharge: 2011-07-04 | Disposition: A | Discharge status: Home / Self Care | Payer: Medicare Other | Source: Ambulatory Visit | Attending: Oncology | Admitting: Oncology

## 2011-07-04 DIAGNOSIS — Z9889 Other specified postprocedural states: Secondary | ICD-10-CM

## 2011-08-28 LAB — BASIC METABOLIC PANEL
BUN: 17
Creatinine, Ser: 1.12
GFR calc non Af Amer: 47 — ABNORMAL LOW
Glucose, Bld: 89
Potassium: 4.7

## 2011-08-28 LAB — PROTIME-INR
INR: 1.3
Prothrombin Time: 16.2 — ABNORMAL HIGH

## 2011-08-28 LAB — DIFFERENTIAL
Basophils Absolute: 0
Basophils Relative: 0
Eosinophils Absolute: 0.2
Eosinophils Relative: 1
Neutrophils Relative %: 71

## 2011-08-28 LAB — CBC
HCT: 38
Platelets: 190
RDW: 12.9

## 2011-11-13 ENCOUNTER — Telehealth: Payer: Self-pay | Admitting: Oncology

## 2011-11-13 NOTE — Telephone Encounter (Signed)
called pt and scheduled appt for [phone]

## 2012-01-12 ENCOUNTER — Other Ambulatory Visit: Payer: Self-pay | Admitting: Cardiology

## 2012-01-12 ENCOUNTER — Other Ambulatory Visit: Payer: Self-pay | Admitting: Oncology

## 2012-02-06 ENCOUNTER — Ambulatory Visit (HOSPITAL_BASED_OUTPATIENT_CLINIC_OR_DEPARTMENT_OTHER): Payer: Medicare Other | Admitting: Oncology

## 2012-02-06 ENCOUNTER — Encounter: Payer: Self-pay | Admitting: Oncology

## 2012-02-06 ENCOUNTER — Other Ambulatory Visit: Payer: Medicare Other | Admitting: Lab

## 2012-02-06 DIAGNOSIS — E559 Vitamin D deficiency, unspecified: Secondary | ICD-10-CM

## 2012-02-06 DIAGNOSIS — M129 Arthropathy, unspecified: Secondary | ICD-10-CM

## 2012-02-06 DIAGNOSIS — D0510 Intraductal carcinoma in situ of unspecified breast: Secondary | ICD-10-CM

## 2012-02-06 DIAGNOSIS — D0590 Unspecified type of carcinoma in situ of unspecified breast: Secondary | ICD-10-CM

## 2012-02-06 LAB — CBC WITH DIFFERENTIAL/PLATELET
BASO%: 0.4 % (ref 0.0–2.0)
Basophils Absolute: 0 10*3/uL (ref 0.0–0.1)
EOS%: 4.3 % (ref 0.0–7.0)
HGB: 11.7 g/dL (ref 11.6–15.9)
MCH: 32.5 pg (ref 25.1–34.0)
MCHC: 32.8 g/dL (ref 31.5–36.0)
MCV: 98.8 fL (ref 79.5–101.0)
MONO%: 13.2 % (ref 0.0–14.0)
RDW: 12.7 % (ref 11.2–14.5)
lymph#: 2 10*3/uL (ref 0.9–3.3)

## 2012-02-06 LAB — COMPREHENSIVE METABOLIC PANEL
ALT: 11 U/L (ref 0–35)
AST: 23 U/L (ref 0–37)
Albumin: 3.6 g/dL (ref 3.5–5.2)
Alkaline Phosphatase: 79 U/L (ref 39–117)
BUN: 11 mg/dL (ref 6–23)
Calcium: 9 mg/dL (ref 8.4–10.5)
Chloride: 104 mEq/L (ref 96–112)
Creatinine, Ser: 1 mg/dL (ref 0.50–1.10)
Potassium: 4.8 mEq/L (ref 3.5–5.3)

## 2012-02-06 NOTE — Patient Instructions (Signed)
1. Continue taking aromasin 25 mg daily ONLY, do not take tamoxifen   2. We will see you back in about 6 months July 08 2012  3. Call with any problems

## 2012-02-06 NOTE — Progress Notes (Signed)
OFFICE PROGRESS NOTE  CC Dr. Rinaldo Cloud Dr. Almond Lint Dr. Chipper Herb  DIAGNOSIS:  78 year old female DCIS diagnosed 07/2010  PRIOR THERAPY: 1. 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 Tumor was ER+  2. S/p radiation therapy to the right breast completed November 2011.  3. Tamoxifen 20 mg from October 21 2010 - November 13, 2010 but discontinued due to possible DVT of the right leg.  3. Switched to aromasin 25 daily starting 11/20/10  CURRENT THERAPY: aromasin 25 mg daily  INTERVAL HISTORY: [name] 78 y.o. female returns for follow up visit. She is doing well except for the peripheral neuropathy and left knee pain. She does suffer from arthritis. Patient had been taking both tamoxifen and aromasin for some unknown reason. I asked her stop the tamoxifen due to her history of DVT. No nausea or vomiting. She has some headaches off and on but nothing persistent. No fevers chills or sweat. No weight loss, eating well.  No hot flashes or vaginal discharge.Remainder of the 10 point review of systems is negative.   MEDICAL HISTORY: Past Medical History  Diagnosis Date  . DCIS (ductal carcinoma in situ) of breast 07/15/2010    ALLERGIES:   has no known allergies.  MEDICATIONS:  Current Outpatient Prescriptions  Medication Sig Dispense Refill  . amLODipine-valsartan (EXFORGE) 5-320 MG per tablet Take 1 tablet by mouth daily.      Marland Kitchen aspirin 81 MG tablet Take 81 mg by mouth daily.      Marland Kitchen atenolol (TENORMIN) 50 MG tablet Take 50 mg by mouth daily.      Marland Kitchen atorvastatin (LIPITOR) 20 MG tablet Take 20 mg by mouth daily.      . celecoxib (CELEBREX) 200 MG capsule Take 200 mg by mouth 2 (two) times daily.      . digoxin (LANOXIN) 0.125 MG tablet Take 125 mcg by mouth daily.      Marland Kitchen exemestane (AROMASIN) 25 MG tablet TAKE 1 TABLET BY MOUTH EVERY DAY  90 tablet  3  . furosemide (LASIX) 40 MG tablet Take 40 mg by mouth daily.      Marland Kitchen glimepiride (AMARYL) 2  MG tablet Take 2 mg by mouth daily before breakfast.      . potassium chloride SA (K-DUR,KLOR-CON) 20 MEQ tablet Take 20 mEq by mouth 2 (two) times daily.      Marland Kitchen warfarin (COUMADIN) 2 MG tablet Take 2 mg by mouth daily. Takes 4mg  (2 tablets) on thurs an Saturday        SURGICAL HISTORY: History reviewed. No pertinent past surgical history.  REVIEW OF SYSTEMS:  Pertinent items are noted in HPI.   PHYSICAL EXAMINATION: General appearance: alert, cooperative, appears stated age, fatigued and moderately obese Neck: no adenopathy, no carotid bruit, no JVD, supple, symmetrical, trachea midline and thyroid not enlarged, symmetric, no tenderness/mass/nodules Lymph nodes: Cervical, supraclavicular, and axillary nodes normal. Resp: clear to auscultation bilaterally and normal percussion bilaterally Back: symmetric, no curvature. ROM normal. No CVA tenderness. Cardio: regular rate and rhythm, S1, S2 normal, no murmur, click, rub or gallop GI: soft, non-tender; bowel sounds normal; no masses,  no organomegaly Extremities: extremities normal, atraumatic, no cyanosis or edema Neurologic: Alert and oriented X 3, normal strength and tone. Normal symmetric reflexes. Normal coordination and gait Right breast well healed scar no masses or nipple discharge, darkening of the skin. LEft Breast: no masses or nipple dischrge  ECOG PERFORMANCE STATUS: 2 - Symptomatic, <  50% confined to bed  Blood pressure 132/73, pulse 74, temperature 98.4 F (36.9 C), temperature source Oral, height 5\' 3"  (1.6 m), weight 242 lb 6.4 oz (109.952 kg).  LABORATORY DATA: Lab Results  Component Value Date   WBC 6.9 02/06/2012   HGB 11.7 02/06/2012   HCT 35.7 02/06/2012   MCV 98.8 02/06/2012   PLT 140* 02/06/2012      Chemistry      Component Value Date/Time   NA 141 06/19/2011 0930   NA 141 06/19/2011 0930   K 4.9 06/19/2011 0930   K 4.9 06/19/2011 0930   CL 103 06/19/2011 0930   CL 103 06/19/2011 0930   CO2 29 06/19/2011 0930   CO2  29 06/19/2011 0930   BUN 13 06/19/2011 0930   BUN 13 06/19/2011 0930   CREATININE 1.10 06/19/2011 0930   CREATININE 1.10 06/19/2011 0930      Component Value Date/Time   CALCIUM 9.4 06/19/2011 0930   CALCIUM 9.4 06/19/2011 0930   ALKPHOS 85 06/19/2011 0930   ALKPHOS 85 06/19/2011 0930   AST 22 06/19/2011 0930   AST 22 06/19/2011 0930   ALT 13 06/19/2011 0930   ALT 13 06/19/2011 0930   BILITOT 1.1 06/19/2011 0930   BILITOT 1.1 06/19/2011 0930       RADIOGRAPHIC STUDIES:  No results found.  ASSESSMENT: 78 year old female with: 1. DCIS doing well 2. On aromasinTolerating well 3. Diabetes 4. Arthritis   PLAN:  1. Continue aromasin as directed.  2. RTC in 6 months for follow up   All questions were answered. The patient knows to call the clinic with any problems, questions or concerns. We can certainly see the patient much sooner if necessary.  I spent 25 minutes counseling the patient face to face. The total time spent in the appointment was 30 minutes.    Drue Second, MD Medical/Oncology [facility] [phone] (beeper) [phone] (Office)  02/06/2012, 10:35 AM

## 2012-02-16 ENCOUNTER — Emergency Department (HOSPITAL_COMMUNITY)
Admission: EM | Admit: 2012-02-16 | Discharge: 2012-02-16 | Disposition: A | Discharge status: Nursing Home (Includes ICF) | Payer: Medicare Other | Source: Home / Self Care | Attending: Emergency Medicine | Admitting: Emergency Medicine

## 2012-02-16 ENCOUNTER — Encounter (HOSPITAL_COMMUNITY): Payer: Self-pay | Admitting: Emergency Medicine

## 2012-02-16 ENCOUNTER — Emergency Department (HOSPITAL_COMMUNITY)
Admission: EM | Admit: 2012-02-16 | Discharge: 2012-02-16 | Disposition: A | Discharge status: Home / Self Care | Payer: Medicare Other | Attending: Emergency Medicine | Admitting: Emergency Medicine

## 2012-02-16 ENCOUNTER — Encounter (HOSPITAL_COMMUNITY): Payer: Self-pay

## 2012-02-16 ENCOUNTER — Other Ambulatory Visit: Payer: Self-pay

## 2012-02-16 ENCOUNTER — Emergency Department (HOSPITAL_COMMUNITY): Payer: Medicare Other

## 2012-02-16 DIAGNOSIS — R609 Edema, unspecified: Secondary | ICD-10-CM | POA: Insufficient documentation

## 2012-02-16 DIAGNOSIS — Z6841 Body Mass Index (BMI) 40.0 and over, adult: Secondary | ICD-10-CM | POA: Insufficient documentation

## 2012-02-16 DIAGNOSIS — Z853 Personal history of malignant neoplasm of breast: Secondary | ICD-10-CM | POA: Insufficient documentation

## 2012-02-16 DIAGNOSIS — E669 Obesity, unspecified: Secondary | ICD-10-CM | POA: Insufficient documentation

## 2012-02-16 DIAGNOSIS — M7989 Other specified soft tissue disorders: Secondary | ICD-10-CM

## 2012-02-16 DIAGNOSIS — Z7982 Long term (current) use of aspirin: Secondary | ICD-10-CM | POA: Insufficient documentation

## 2012-02-16 DIAGNOSIS — Z7901 Long term (current) use of anticoagulants: Secondary | ICD-10-CM | POA: Insufficient documentation

## 2012-02-16 DIAGNOSIS — M129 Arthropathy, unspecified: Secondary | ICD-10-CM | POA: Insufficient documentation

## 2012-02-16 DIAGNOSIS — I4891 Unspecified atrial fibrillation: Secondary | ICD-10-CM | POA: Insufficient documentation

## 2012-02-16 DIAGNOSIS — E119 Type 2 diabetes mellitus without complications: Secondary | ICD-10-CM | POA: Insufficient documentation

## 2012-02-16 DIAGNOSIS — G609 Hereditary and idiopathic neuropathy, unspecified: Secondary | ICD-10-CM | POA: Insufficient documentation

## 2012-02-16 DIAGNOSIS — Z86718 Personal history of other venous thrombosis and embolism: Secondary | ICD-10-CM | POA: Insufficient documentation

## 2012-02-16 DIAGNOSIS — R0602 Shortness of breath: Secondary | ICD-10-CM | POA: Insufficient documentation

## 2012-02-16 HISTORY — DX: Unspecified osteoarthritis, unspecified site: M19.90

## 2012-02-16 HISTORY — DX: Polyneuropathy, unspecified: G62.9

## 2012-02-16 HISTORY — DX: Acute embolism and thrombosis of unspecified deep veins of unspecified lower extremity: I82.409

## 2012-02-16 LAB — POCT I-STAT, CHEM 8
BUN: 10 mg/dL (ref 6–23)
Calcium, Ion: 1.2 mmol/L (ref 1.12–1.32)
Creatinine, Ser: 0.9 mg/dL (ref 0.50–1.10)
Glucose, Bld: 144 mg/dL — ABNORMAL HIGH (ref 70–99)
Hemoglobin: 13.6 g/dL (ref 12.0–15.0)
TCO2: 29 mmol/L (ref 0–100)

## 2012-02-16 LAB — POCT I-STAT TROPONIN I

## 2012-02-16 LAB — CBC
HCT: 37.9 % (ref 36.0–46.0)
Hemoglobin: 12.5 g/dL (ref 12.0–15.0)
MCH: 32 pg (ref 26.0–34.0)
MCHC: 33 g/dL (ref 30.0–36.0)
RDW: 12.2 % (ref 11.5–15.5)

## 2012-02-16 LAB — DIFFERENTIAL
Basophils Absolute: 0 10*3/uL (ref 0.0–0.1)
Basophils Relative: 0 % (ref 0–1)
Eosinophils Absolute: 0.1 10*3/uL (ref 0.0–0.7)
Lymphs Abs: 3.5 10*3/uL (ref 0.7–4.0)
Monocytes Absolute: 0.6 10*3/uL (ref 0.1–1.0)
Neutro Abs: 3.7 10*3/uL (ref 1.7–7.7)

## 2012-02-16 MED ORDER — FUROSEMIDE 40 MG PO TABS: 40.0000 mg | ORAL_TABLET | Freq: Two times a day (BID) | ORAL | Status: DC

## 2012-02-16 NOTE — Progress Notes (Signed)
VASCULAR LAB PRELIMINARY  PRELIMINARY  PRELIMINARY  PRELIMINARY  Bilateral lower extremity venous Dopplers completed.    Preliminary report:  There is no DVT or SVT noted in the bilateral lower extremities.    Sherren Kerns [place], 02/16/2012, 3:02 PM

## 2012-02-16 NOTE — ED Notes (Signed)
Patient states increased edema bilateral lower extremities onset one week ago.  Seen at urgent care today sent to ED for further evaluation.  Right foot and ankle +2 edema and left foot ankle +3 edema.  Pedal pulses +1bilateral.  Ax4 airway intact bilateral equal chest rise and fall.

## 2012-02-16 NOTE — ED Provider Notes (Signed)
History     CSN: [id number]  Arrival date & time 02/16/12  1153   First MD Initiated Contact with Patient 02/16/12 1354      Chief Complaint  Patient presents with  . Leg Swelling    (Consider location/radiation/quality/duration/timing/severity/associated sxs/prior treatment) The history is provided by the patient.   patient has had lower stomach swelling for the last several days. His been some pain with it. She has a previous history of breast cancer is on tamoxifen. She states her Lasix was decreased a couple months ago. In December of 2011 she was evaluated for DVT. She is on Coumadin for A. fib and denies previous DVTs. No abdominal pain. No trouble breathing. No chest pain. She is on Coumadin and states she has been therapeutic for a while it does not adjustment of her medications.  Past Medical History  Diagnosis Date  . 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 DVT (deep venous thrombosis)   . Peripheral neuropathy   . Arthritis   . Diabetes mellitus   . Cancer     DCIS s/p lumpectomy and radiation 2011    Past Surgical History  Procedure Date  . Breast surgery     2012    No family history on file.  History  Substance Use Topics  . Smoking status: Never Smoker   . Smokeless tobacco: Never Used  . Alcohol Use: No    OB History    Grav Para Term Preterm Abortions TAB SAB Ect Mult Living                  Review of Systems  Constitutional: Negative for fever, activity change, appetite change and fatigue.  HENT: Negative for neck stiffness.   Eyes: Negative for pain.  Respiratory: Negative for chest tightness and shortness of breath.   Cardiovascular: Positive for leg swelling. Negative for chest pain.  Gastrointestinal: Negative for nausea, vomiting, abdominal pain and diarrhea.  Genitourinary: Negative for flank pain.  Musculoskeletal: Negative for back pain.  Skin: Negative for rash.  Neurological: Negative for weakness, numbness and  headaches.  Psychiatric/Behavioral: Negative for behavioral problems.    Allergies  Review of patient's allergies indicates no known allergies.  Home Medications   Current Outpatient Rx  Name Route Sig Dispense Refill  . AMLODIPINE BESYLATE-VALSARTAN 5-320 MG PO TABS Oral Take 1 tablet by mouth daily.    . ASPIRIN 81 MG PO TABS Oral Take 81 mg by mouth daily.    . ATENOLOL 50 MG PO TABS Oral Take 50 mg by mouth daily.    . ATORVASTATIN CALCIUM 20 MG PO TABS Oral Take 20 mg by mouth daily.    . CELECOXIB 200 MG PO CAPS Oral Take 200 mg by mouth 2 (two) times daily.    Marland Kitchen DIGOXIN 0.125 MG PO TABS Oral Take 125 mcg by mouth daily.    Marland Kitchen EXEMESTANE 25 MG PO TABS Oral Take 25 mg by mouth daily after breakfast.    . FUROSEMIDE 40 MG PO TABS Oral Take 40 mg by mouth daily.    Marland Kitchen GLIMEPIRIDE 2 MG PO TABS Oral Take 2 mg by mouth daily before breakfast.    . POTASSIUM CHLORIDE CRYS ER 20 MEQ PO TBCR Oral Take 20 mEq by mouth 2 (two) times daily.    . WARFARIN SODIUM 2 MG PO TABS Oral Take 2 mg by mouth daily. Takes 4mg  (2 tablets) on thurs an Saturday and all other days takes 2 mg  BP 155/96  Pulse 82  Temp 98.2 F (36.8 C)  Resp 20  Ht 5\' 3"  (1.6 m)  Wt 242 lb (109.77 kg)  BMI 42.87 kg/m2  SpO2 97%  Physical Exam  Nursing note and vitals reviewed. Constitutional: She is oriented to person, place, and time. She appears well-developed and well-nourished.       Patient is obese  HENT:  Head: Normocephalic and atraumatic.  Eyes: EOM are normal. Pupils are equal, round, and reactive to light.  Neck: Normal range of motion. Neck supple.  Cardiovascular: Normal rate, regular rhythm and normal heart sounds.   No murmur heard. Pulmonary/Chest: Effort normal and breath sounds normal. No respiratory distress. She has no wheezes. She has no rales.  Abdominal: Soft. Bowel sounds are normal. She exhibits no distension. There is no tenderness. There is no rebound and no guarding.    Musculoskeletal: Normal range of motion. She exhibits edema.       Bilateral lower extremity pitting edema. Worse on left side.  Neurological: She is alert and oriented to person, place, and time. No cranial nerve deficit.  Skin: Skin is warm and dry.  Psychiatric: She has a normal mood and affect. Her speech is normal.    ED Course  Procedures (including critical care time)  Labs Reviewed  PRO B NATRIURETIC PEPTIDE - Abnormal; Notable for the following:    Pro B Natriuretic peptide (BNP) 1191.0 (*)    All other components within normal limits  POCT I-STAT, CHEM 8 - Abnormal; Notable for the following:    Glucose, Bld 144 (*)    All other components within normal limits  CBC  DIFFERENTIAL  POCT I-STAT TROPONIN I   Dg Chest 2 View  02/16/2012  *RADIOLOGY REPORT*  Clinical Data: Edema, weakness, shortness of breath  CHEST - 2 VIEW  Comparison: 11/13/2010  Findings: Stable cardiomegaly with vascular and interstitial prominence as before.  No significant interval change or superimposed CHF.  No effusion or pneumothorax.  No collapse, consolidation, or focal airspace process.  Trachea midline. Atherosclerosis of the aorta with slight tortuosity.  IMPRESSION: Stable cardiomegaly with vascular congestion and interstitial prominence.  No significant interval change compared 11/13/2010  Original Report Authenticated By: Judie Petit. Ruel Favors, M.D.     No diagnosis found.    Date: 02/16/2012  Rate: 64  Rhythm: atrial fibrillation  QRS Axis: normal  Intervals: normal  ST/T Wave abnormalities: nonspecific T wave changes  Conduction Disutrbances:none  Narrative Interpretation:   Old EKG Reviewed: unchanged   MDM  peripheral edema. Sent from urgent care with concern for DVTs. Negative Dopplers. Patient with a history of cancer. She recently had her Lasix dose decreased. She has an elevated BNP. Chest x-ray does not show severe CHF. She'll be discharged home to followup with her doctor with  increased the Lasix.     Juliet Rude. Rubin Payor, MD 02/16/12 1606

## 2012-02-16 NOTE — ED Provider Notes (Signed)
History     CSN: [id number]  Arrival date & time 02/16/12  0910   First MD Initiated Contact with Patient 02/16/12 1056      Chief Complaint  Patient presents with  . Leg Pain    (Consider location/radiation/quality/duration/timing/severity/associated sxs/prior treatment) HPI Comments:  Patient with history of breast cancer presents with bilateral lower extremity swelling, left more than right, and increasing left leg pain over the past week. Patient states that it feels like "something is going up my leg". Patient states that pain is worse at the end of the day, and is better in the morning and with elevation. No coughing, wheezing, chest pain, change in baseline shortness of breath, unintentional weight gain, orthopnea, nocturia, abdominal pain. Denies any recent travel, prolonged immobilization, recent surgeries. She had her Lasix decreased from 40 mg twice a day to 40 mg once daily 2 months ago, but states that this leg swelling is new.  Patient had a similar presentation in December 2011, was evaluated for DVT in the ER, was found to be subtherapeutic on Coumadin, but bilateral Dopplers were negative.  final diagnosis was fluid retention. Patient states she is not had any other previous diagnoses of DVT. Patient was seen by Dr. Welton Flakes, her oncologist on 3/1. Per chart, patient was asked to stop the tamoxifen in December 2011, but patient states that she was taking the tamoxifen and the aromosin until 2 weeks ago.  Patient is status post lumpectomy in August 2011, and radiation therapy in November 2011 for DCIS right breast.  ROS as noted in HPI. All other ROS negative.   Patient is a 78 y.o. female presenting with leg pain.  Leg Pain     Past Medical History  Diagnosis Date  . 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 DVT (deep venous thrombosis)   . Peripheral neuropathy   . Arthritis   . Diabetes mellitus   . Cancer     DCIS s/p lumpectomy and radiation 2011     Past Surgical History  Procedure Date  . Breast surgery     2012    History reviewed. No pertinent family history.  History  Substance Use Topics  . Smoking status: Never Smoker   . Smokeless tobacco: Never Used  . Alcohol Use: No    OB History    Grav Para Term Preterm Abortions TAB SAB Ect Mult Living                  Review of Systems  Allergies  Review of patient's allergies indicates no known allergies.  Home Medications   Current Outpatient Rx  Name Route Sig Dispense Refill  . AMLODIPINE BESYLATE-VALSARTAN 5-320 MG PO TABS Oral Take 1 tablet by mouth daily.    . ASPIRIN 81 MG PO TABS Oral Take 81 mg by mouth daily.    . ATENOLOL 50 MG PO TABS Oral Take 50 mg by mouth daily.    . ATORVASTATIN CALCIUM 20 MG PO TABS Oral Take 20 mg by mouth daily.    . CELECOXIB 200 MG PO CAPS Oral Take 200 mg by mouth 2 (two) times daily.    Marland Kitchen DIGOXIN 0.125 MG PO TABS Oral Take 125 mcg by mouth daily.    Marland Kitchen EXEMESTANE 25 MG PO TABS  TAKE 1 TABLET BY MOUTH EVERY DAY 90 tablet 3  . FUROSEMIDE 40 MG PO TABS Oral Take 40 mg by mouth daily.    Marland Kitchen GLIMEPIRIDE 2 MG PO TABS  Oral Take 2 mg by mouth daily before breakfast.    . POTASSIUM CHLORIDE CRYS ER 20 MEQ PO TBCR Oral Take 20 mEq by mouth 2 (two) times daily.    . WARFARIN SODIUM 2 MG PO TABS Oral Take 2 mg by mouth daily. Takes 4mg  (2 tablets) on thurs an Saturday      BP 139/61  Pulse 72  Temp(Src) 97.9 F (36.6 C) (Oral)  Resp 20  SpO2 99%  Physical Exam  Nursing note and vitals reviewed. Constitutional: She is oriented to person, place, and time. She appears well-developed and well-nourished. No distress.  HENT:  Head: Normocephalic and atraumatic.  Eyes: Conjunctivae and EOM are normal.  Neck: Normal range of motion.  Cardiovascular: Normal rate.  An irregularly irregular rhythm present.  Murmur heard. Pulmonary/Chest: Effort normal.  Abdominal: She exhibits no distension.  Musculoskeletal: Normal range of  motion.       2+ pitting edema to knee left lower extremity, 1+ pitting edema to knee right lower extremity. DP 2+ bilaterally. No calf tenderness, palpable cord. No redness. calves aymmetric, L calf > R  Neurological: She is alert and oriented to person, place, and time.  Skin: Skin is warm and dry.  Psychiatric: She has a normal mood and affect. Her behavior is normal. Judgment and thought content normal.    ED Course  Procedures (including critical care time)  Labs Reviewed - No data to display No results found.   1. Leg swelling       MDM  Previous records extensively reviewed. As noted in history of present illness.   H&P most suggestive of edema secondary to fluid retention, but patient still at high risk for recurrent DVT, especially as patient has been taking her tamoxifen. and with history of Coumadin noncompliance, concern for  DVT.  vital signs acceptable. transferring to ED.   Luiz Blare, MD 02/16/12 1157

## 2012-02-16 NOTE — ED Provider Notes (Deleted)
  Physical Exam  BP 155/96  Pulse 82  Temp 98.2 F (36.8 C)  Resp 20  Ht 5\' 3"  (1.6 m)  Wt 242 lb (109.77 kg)  BMI 42.87 kg/m2  SpO2 97%  Physical Exam  ED Course  Procedures  MDM Pitting edema bilateral lower extremities. Previous history of A. fib for which she is on Coumadin for patient. Past medical history does show a history of DVT, the patient denies that. She was seen in urgent care and sent here to rule out DVT. She reportedly has had a decrease in her Lasix recently.      [name]. Rubin Payor, MD 02/16/12 1406

## 2012-02-16 NOTE — ED Notes (Signed)
PT HERE WITH LEFT LEG ACHY,CONSTANT PAIN SHOOTING UP TO THIGH THAT STARTED LAST WEEK BUT HAS PROGRESSIVELY GOTTEN WORSE WITH PAIN AND SWELLING.PT STATES SHE WAS TAKING TAMOXIFEN S/P BREAST SURGERY LAST YR AND STATES SHE WAS HAVING SIDE EFFECTS SO ONCOLOGIST PRESCRIBED EXEMESTANE BUT PT CHANGED PROVIDERS AND WAS TAKING BOTH MEDS UNTIL LAST WEEK WHEN NOTICED SX.SWELLING SEEN BUT DENIES NUMB/TINGLING

## 2012-02-16 NOTE — ED Notes (Signed)
Bilateral lower leg swelling  Lt. More than right. Began about 1 week ago. Pt. Sent to [place] from urgent care.

## 2012-02-16 NOTE — Discharge Instructions (Signed)
Increase her Lasix to twice a day. Followup later this week with your Doctor.  Peripheral Edema You have swelling in your legs (peripheral edema). This swelling is due to excess accumulation of salt and water in your body. Edema may be a sign of heart, kidney or liver disease, or a side effect of a medication. It may also be due to problems in the leg veins. Elevating your legs and using special support stockings may be very helpful, if the cause of the swelling is due to poor venous circulation. Avoid long periods of standing, whatever the cause. Treatment of edema depends on identifying the cause. Chips, pretzels, pickles and other salty foods should be avoided. Restricting salt in your diet is almost always needed. Water pills (diuretics) are often used to remove the excess salt and water from your body via urine. These medicines prevent the kidney from reabsorbing sodium. This increases urine flow. Diuretic treatment may also result in lowering of potassium levels in your body. Potassium supplements may be needed if you have to use diuretics daily. Daily weights can help you keep track of your progress in clearing your edema. You should call your caregiver for follow up care as recommended. SEEK IMMEDIATE MEDICAL CARE IF:   You have increased swelling, pain, redness, or heat in your legs.   You develop shortness of breath, especially when lying down.   You develop chest or abdominal pain, weakness, or fainting.   You have a fever.  Document Released: 01/01/2005 Document Revised: 11/13/2011 Document Reviewed: 12/12/2009 [facility] Patient Information 2012 [place], [place].

## 2012-05-31 ENCOUNTER — Other Ambulatory Visit: Payer: Self-pay | Admitting: Oncology

## 2012-05-31 DIAGNOSIS — Z853 Personal history of malignant neoplasm of breast: Secondary | ICD-10-CM

## 2012-05-31 DIAGNOSIS — Z9889 Other specified postprocedural states: Secondary | ICD-10-CM

## 2012-06-30 ENCOUNTER — Telehealth: Payer: Self-pay | Admitting: Medical Oncology

## 2012-06-30 NOTE — Telephone Encounter (Signed)
Informed patient of new appointment: 08/24/2012, lab @ 0830, MD @ 0900.  Patient confirmed date and time.  No questions at this time

## 2012-07-05 ENCOUNTER — Ambulatory Visit
Admission: RE | Admit: 2012-07-05 | Discharge: 2012-07-05 | Disposition: A | Discharge status: Home / Self Care | Payer: Medicare Other | Source: Ambulatory Visit | Attending: Oncology | Admitting: Oncology

## 2012-07-05 DIAGNOSIS — Z9889 Other specified postprocedural states: Secondary | ICD-10-CM

## 2012-07-05 DIAGNOSIS — Z853 Personal history of malignant neoplasm of breast: Secondary | ICD-10-CM

## 2012-07-08 ENCOUNTER — Other Ambulatory Visit: Payer: Medicare Other | Admitting: Lab

## 2012-07-08 ENCOUNTER — Ambulatory Visit: Payer: Medicare Other | Admitting: Family

## 2012-08-24 ENCOUNTER — Ambulatory Visit: Payer: Medicare Other | Admitting: Lab

## 2012-08-24 ENCOUNTER — Ambulatory Visit (HOSPITAL_BASED_OUTPATIENT_CLINIC_OR_DEPARTMENT_OTHER): Payer: Medicare Other | Admitting: Oncology

## 2012-08-24 ENCOUNTER — Telehealth: Payer: Self-pay | Admitting: *Deleted

## 2012-08-24 ENCOUNTER — Encounter: Payer: Self-pay | Admitting: Oncology

## 2012-08-24 ENCOUNTER — Other Ambulatory Visit (HOSPITAL_BASED_OUTPATIENT_CLINIC_OR_DEPARTMENT_OTHER): Payer: Medicare Other | Admitting: Lab

## 2012-08-24 VITALS — BP 104/66 | HR 74 | Temp 97.8°F | Resp 20 | Ht 63.0 in | Wt 238.4 lb

## 2012-08-24 DIAGNOSIS — D0510 Intraductal carcinoma in situ of unspecified breast: Secondary | ICD-10-CM

## 2012-08-24 DIAGNOSIS — D519 Vitamin B12 deficiency anemia, unspecified: Secondary | ICD-10-CM

## 2012-08-24 DIAGNOSIS — E119 Type 2 diabetes mellitus without complications: Secondary | ICD-10-CM

## 2012-08-24 DIAGNOSIS — D649 Anemia, unspecified: Secondary | ICD-10-CM

## 2012-08-24 DIAGNOSIS — D0590 Unspecified type of carcinoma in situ of unspecified breast: Secondary | ICD-10-CM

## 2012-08-24 DIAGNOSIS — E559 Vitamin D deficiency, unspecified: Secondary | ICD-10-CM

## 2012-08-24 LAB — COMPREHENSIVE METABOLIC PANEL (CC13)
ALT: 11 U/L (ref 0–55)
AST: 22 U/L (ref 5–34)
Alkaline Phosphatase: 89 U/L (ref 40–150)
Sodium: 140 mEq/L (ref 136–145)
Total Bilirubin: 0.9 mg/dL (ref 0.20–1.20)
Total Protein: 6.8 g/dL (ref 6.4–8.3)

## 2012-08-24 LAB — CBC WITH DIFFERENTIAL/PLATELET
BASO%: 0.4 % (ref 0.0–2.0)
EOS%: 2.3 % (ref 0.0–7.0)
Eosinophils Absolute: 0.2 10*3/uL (ref 0.0–0.5)
MCH: 33.6 pg (ref 25.1–34.0)
MCV: 102.3 fL — ABNORMAL HIGH (ref 79.5–101.0)
MONO%: 9.9 % (ref 0.0–14.0)
NEUT#: 5.2 10*3/uL (ref 1.5–6.5)
RBC: 2.63 10*6/uL — ABNORMAL LOW (ref 3.70–5.45)
RDW: 14.4 % (ref 11.2–14.5)

## 2012-08-24 MED ORDER — EXEMESTANE 25 MG PO TABS: 25.0000 mg | ORAL_TABLET | Freq: Every day | ORAL | Status: DC

## 2012-08-24 NOTE — Patient Instructions (Addendum)
1. Anemia: will check more to see why you are anemic  2. I will see you back in 6 months

## 2012-08-24 NOTE — Telephone Encounter (Signed)
Gave patient appointment for  02-21-2013 starting at 9:00am

## 2012-08-24 NOTE — Telephone Encounter (Signed)
Gave patient appointment for  02-21-2013 starting at 9:00am  

## 2012-08-24 NOTE — Progress Notes (Signed)
OFFICE PROGRESS NOTE  CC Dr. Rinaldo Cloud Dr. Almond Lint Dr. Chipper Herb  DIAGNOSIS:  78 year old female DCIS diagnosed 07/2010  PRIOR THERAPY: 1. 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 Tumor was ER+  2. S/p radiation therapy to the right breast completed November 2011.  3. Tamoxifen 20 mg from October 21 2010 - November 13, 2010 but discontinued due to possible DVT of the right leg.  3. Switched to aromasin 25 daily starting 11/20/10  CURRENT THERAPY: aromasin 25 mg daily  INTERVAL HISTORY: [name] 78 y.o. female returns for follow up visit. Overall she's doing well. She denies any shortness of breath chest pains or palpitations she has no nausea or vomiting no myalgias or arthralgias. She has no dominant no pain. Remainder of the 10 point review of systems is unremarkable. MEDICAL HISTORY: Past Medical History  Diagnosis Date  . 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 DVT (deep venous thrombosis)   . Peripheral neuropathy   . Arthritis   . Diabetes mellitus   . Cancer     DCIS s/p lumpectomy and radiation 2011    ALLERGIES:   has no known allergies.  MEDICATIONS:  Current Outpatient Prescriptions  Medication Sig Dispense Refill  . amLODipine-valsartan (EXFORGE) 5-320 MG per tablet Take 1 tablet by mouth daily.      Marland Kitchen aspirin 81 MG tablet Take 81 mg by mouth daily.      Marland Kitchen atenolol (TENORMIN) 50 MG tablet Take 50 mg by mouth daily.      Marland Kitchen atorvastatin (LIPITOR) 20 MG tablet Take 20 mg by mouth daily.      . celecoxib (CELEBREX) 200 MG capsule Take 200 mg by mouth 2 (two) times daily.      . digoxin (LANOXIN) 0.125 MG tablet Take 125 mcg by mouth daily.      Marland Kitchen exemestane (AROMASIN) 25 MG tablet Take 25 mg by mouth daily after breakfast.      . furosemide (LASIX) 40 MG tablet Take 1 tablet (40 mg total) by mouth 2 (two) times daily.  30 tablet  0  . glimepiride (AMARYL) 2 MG tablet Take 2 mg by mouth daily before  breakfast.      . potassium chloride SA (K-DUR,KLOR-CON) 20 MEQ tablet Take 20 mEq by mouth 2 (two) times daily.      Marland Kitchen warfarin (COUMADIN) 2 MG tablet Take 2 mg by mouth daily. Takes 4mg  (2 tablets) on thurs an Saturday and all other days takes 2 mg        SURGICAL HISTORY:  Past Surgical History  Procedure Date  . Breast surgery     2012    REVIEW OF SYSTEMS:  Pertinent items are noted in HPI.   PHYSICAL EXAMINATION:  Well developed female in no acute distress HEENT exam EOMI PERRLA sclerae anicteric no conjunctival pallor oral mucosa is moist neck is supple lungs are clear bilaterally to auscultation cardiovascular is regular rate rhythm no murmurs gallops or rubs abdomen is soft nontender nondistended bowel sounds are present no HSM extremities no edema neuro patient's alert oriented otherwise nonfocal Right breast well healed scar no masses or nipple discharge, darkening of the skin. LEft Breast: no masses or nipple dischrge  ECOG PERFORMANCE STATUS: 2 - Symptomatic, <50% confined to bed  Blood pressure 104/66, pulse 74, temperature 97.8 F (36.6 C), temperature source Oral, resp. rate 20, height 5\' 3"  (1.6 m), weight 238 lb 6.4 oz (  108.138 kg).  LABORATORY DATA: Lab Results  Component Value Date   WBC 8.4 08/24/2012   HGB 8.8* 08/24/2012   HCT 26.9* 08/24/2012   MCV 102.3* 08/24/2012   PLT 166 08/24/2012      Chemistry      Component Value Date/Time   NA 142 02/16/2012 1436   K 4.5 02/16/2012 1436   CL 102 02/16/2012 1436   CO2 24 02/06/2012 0928   BUN 10 02/16/2012 1436   CREATININE 0.90 02/16/2012 1436      Component Value Date/Time   CALCIUM 9.0 02/06/2012 0928   ALKPHOS 79 02/06/2012 0928   AST 23 02/06/2012 0928   ALT 11 02/06/2012 0928   BILITOT 0.8 02/06/2012 0928       RADIOGRAPHIC STUDIES:  No results found.  ASSESSMENT: 78 year old female with: 1. DCIS doing well 2. On aromasinTolerating well 3. Diabetes 4. Arthritis 5. Anemia unknown cause work up for anemia  of chronic disease vs. MDS   PLAN:  1. Continue aromasin as directed.  2. RTC in 6 months for follow up or sooner   All questions were answered. The patient knows to call the clinic with any problems, questions or concerns. We can certainly see the patient much sooner if necessary.  I spent 25 minutes counseling the patient face to face. The total time spent in the appointment was 30 minutes.    Drue Second, MD Medical/Oncology [facility] [phone] (beeper) [phone] (Office)  08/24/2012, 9:32 AM

## 2012-08-25 LAB — IRON AND TIBC
%SAT: 18 % — ABNORMAL LOW (ref 20–55)
Iron: 53 ug/dL (ref 42–145)
TIBC: 293 ug/dL (ref 250–470)

## 2012-08-25 LAB — FERRITIN: Ferritin: 107 ng/mL (ref 10–291)

## 2012-08-25 LAB — VITAMIN D 25 HYDROXY (VIT D DEFICIENCY, FRACTURES): Vit D, 25-Hydroxy: 21 ng/mL — ABNORMAL LOW (ref 30–89)

## 2012-08-27 ENCOUNTER — Other Ambulatory Visit (HOSPITAL_COMMUNITY): Payer: Self-pay | Admitting: Cardiology

## 2012-08-28 ENCOUNTER — Emergency Department (HOSPITAL_COMMUNITY)
Admission: EM | Admit: 2012-08-28 | Discharge: 2012-08-28 | Disposition: A | Discharge status: Home / Self Care | Payer: Medicare Other | Attending: Emergency Medicine | Admitting: Emergency Medicine

## 2012-08-28 ENCOUNTER — Encounter (HOSPITAL_COMMUNITY): Payer: Self-pay | Admitting: Physical Medicine and Rehabilitation

## 2012-08-28 ENCOUNTER — Emergency Department (HOSPITAL_COMMUNITY): Payer: Medicare Other

## 2012-08-28 DIAGNOSIS — E162 Hypoglycemia, unspecified: Secondary | ICD-10-CM

## 2012-08-28 DIAGNOSIS — E1169 Type 2 diabetes mellitus with other specified complication: Secondary | ICD-10-CM | POA: Insufficient documentation

## 2012-08-28 DIAGNOSIS — R5381 Other malaise: Secondary | ICD-10-CM | POA: Insufficient documentation

## 2012-08-28 DIAGNOSIS — Z79899 Other long term (current) drug therapy: Secondary | ICD-10-CM | POA: Insufficient documentation

## 2012-08-28 LAB — URINALYSIS, ROUTINE W REFLEX MICROSCOPIC
Glucose, UA: NEGATIVE mg/dL
Hgb urine dipstick: NEGATIVE
Ketones, ur: NEGATIVE mg/dL
Protein, ur: NEGATIVE mg/dL

## 2012-08-28 LAB — CBC WITH DIFFERENTIAL/PLATELET
Basophils Absolute: 0 10*3/uL (ref 0.0–0.1)
Eosinophils Absolute: 0.1 10*3/uL (ref 0.0–0.7)
Eosinophils Relative: 1 % (ref 0–5)
HCT: 30.4 % — ABNORMAL LOW (ref 36.0–46.0)
Lymphocytes Relative: 22 % (ref 12–46)
Lymphs Abs: 2.1 10*3/uL (ref 0.7–4.0)
MCH: 33.3 pg (ref 26.0–34.0)
MCV: 105.6 fL — ABNORMAL HIGH (ref 78.0–100.0)
Monocytes Absolute: 0.7 10*3/uL (ref 0.1–1.0)
Platelets: 221 10*3/uL (ref 150–400)
RDW: 16.3 % — ABNORMAL HIGH (ref 11.5–15.5)
WBC: 9.3 10*3/uL (ref 4.0–10.5)

## 2012-08-28 LAB — COMPREHENSIVE METABOLIC PANEL
AST: 31 U/L (ref 0–37)
Albumin: 3.7 g/dL (ref 3.5–5.2)
Alkaline Phosphatase: 98 U/L (ref 39–117)
Chloride: 105 mEq/L (ref 96–112)
Potassium: 3.8 mEq/L (ref 3.5–5.1)
Total Bilirubin: 0.7 mg/dL (ref 0.3–1.2)

## 2012-08-28 LAB — GLUCOSE, CAPILLARY
Glucose-Capillary: 34 mg/dL — CL (ref 70–99)
Glucose-Capillary: 80 mg/dL (ref 70–99)

## 2012-08-28 LAB — TROPONIN I: Troponin I: <0.3 ng/mL (ref <0.30)

## 2012-08-28 LAB — PROTIME-INR: INR: 4.91 — ABNORMAL HIGH (ref 0.00–1.49)

## 2012-08-28 MED ORDER — DEXTROSE 50 % IV SOLN
INTRAVENOUS | Status: AC
  Filled 2012-08-28: qty 50

## 2012-08-28 MED ORDER — DEXTROSE 50 % IV SOLN
1.0000 | Freq: Once | INTRAVENOUS | Status: AC
  Administered 2012-08-28: 50 mL via INTRAVENOUS

## 2012-08-28 NOTE — ED Provider Notes (Signed)
History     CSN: [id number]  Arrival date & time 08/28/12  1530   First MD Initiated Contact with Patient 08/28/12 1615      Chief Complaint  Patient presents with  . Shortness of Breath  . Fatigue    (Consider location/radiation/quality/duration/timing/severity/associated sxs/prior treatment) HPI.... level V caveat for urgent need for intervention. Generalized weakness since this morning. Also reports feeling hot and sweaty although mild dyspnea. Patient saw primary care Dr. early this week. He increased her diabetic medicine and her diuretic.  Nothing makes symptoms better or worse.  Past Medical History  Diagnosis Date  . 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 DVT (deep venous thrombosis)   . Peripheral neuropathy   . Arthritis   . Diabetes mellitus   . Cancer     DCIS s/p lumpectomy and radiation 2011    Past Surgical History  Procedure Date  . Breast surgery     2012    No family history on file.  History  Substance Use Topics  . Smoking status: Never Smoker   . Smokeless tobacco: Never Used  . Alcohol Use: No    OB History    Grav Para Term Preterm Abortions TAB SAB Ect Mult Living                  Review of Systems  Unable to perform ROS: Other    Allergies  Review of patient's allergies indicates no known allergies.  Home Medications   Current Outpatient Rx  Name Route Sig Dispense Refill  . AMLODIPINE BESYLATE-VALSARTAN 5-320 MG PO TABS Oral Take 1 tablet by mouth daily.    . ASPIRIN 81 MG PO TABS Oral Take 81 mg by mouth daily.    . ATENOLOL 50 MG PO TABS Oral Take 50 mg by mouth daily.    . ATORVASTATIN CALCIUM 20 MG PO TABS Oral Take 20 mg by mouth daily.    . CELECOXIB 200 MG PO CAPS Oral Take 200 mg by mouth 2 (two) times daily.    Marland Kitchen DIGOXIN 0.125 MG PO TABS Oral Take 125 mcg by mouth daily.    Marland Kitchen EXEMESTANE 25 MG PO TABS Oral Take 25 mg by mouth daily after breakfast.    . FUROSEMIDE 40 MG PO TABS Oral Take 40 mg by  mouth daily.    Marland Kitchen GLIMEPIRIDE 2 MG PO TABS Oral Take 2 mg by mouth daily before breakfast.    . POTASSIUM CHLORIDE CRYS ER 20 MEQ PO TBCR Oral Take 20 mEq by mouth 2 (two) times daily.    . WARFARIN SODIUM 2 MG PO TABS Oral Take 2-4 mg by mouth daily. Takes 4mg  (2 tablets) on Thurs and Saturday and all other days takes 2 mg      BP 122/49  Pulse 67  Temp 98.7 F (37.1 C) (Oral)  Resp 23  SpO2 93%  Physical Exam  Nursing note and vitals reviewed. Constitutional: She is oriented to person, place, and time. She appears well-developed and well-nourished.       Answers questions appropriately  HENT:  Head: Normocephalic and atraumatic.  Eyes: Conjunctivae normal and EOM are normal. Pupils are equal, round, and reactive to light.  Neck: Normal range of motion. Neck supple.  Cardiovascular: Normal rate, regular rhythm and normal heart sounds.   Pulmonary/Chest: Effort normal and breath sounds normal.  Abdominal: Soft. Bowel sounds are normal.  Musculoskeletal: Normal range of motion.  Neurological: She is alert and oriented  to person, place, and time.  Skin: Skin is warm and dry.  Psychiatric: She has a normal mood and affect.    ED Course  Procedures (including critical care time)  Labs Reviewed  CBC WITH DIFFERENTIAL - Abnormal; Notable for the following:    RBC 2.88 (*)     Hemoglobin 9.6 (*)     HCT 30.4 (*)     MCV 105.6 (*)     RDW 16.3 (*)     All other components within normal limits  COMPREHENSIVE METABOLIC PANEL - Abnormal; Notable for the following:    Glucose, Bld 31 (*)     Creatinine, Ser 1.12 (*)     GFR calc non Af Amer 44 (*)     GFR calc Af Amer 51 (*)     All other components within normal limits  URINALYSIS, ROUTINE W REFLEX MICROSCOPIC - Abnormal; Notable for the following:    APPearance CLOUDY (*)     Leukocytes, UA LARGE (*)     All other components within normal limits  DIGOXIN LEVEL - Abnormal; Notable for the following:    Digoxin Level 0.7 (*)      All other components within normal limits  PROTIME-INR - Abnormal; Notable for the following:    Prothrombin Time 42.6 (*)     INR 4.91 (*)     All other components within normal limits  GLUCOSE, CAPILLARY - Abnormal; Notable for the following:    Glucose-Capillary 34 (*)     All other components within normal limits  GLUCOSE, CAPILLARY - Abnormal; Notable for the following:    Glucose-Capillary 35 (*)     All other components within normal limits  URINE MICROSCOPIC-ADD ON - Abnormal; Notable for the following:    Bacteria, UA FEW (*)     All other components within normal limits  TROPONIN I  GLUCOSE, CAPILLARY   Dg Chest 2 View  08/28/2012  *RADIOLOGY REPORT*  Clinical Data: Shortness of breath  CHEST - 2 VIEW  Comparison: 02/16/2012  Findings: Cardiomegaly again noted.  Stable central mild vascular congestion and chronic mild interstitial prominence.  No convincing pulmonary edema.  No focal infiltrate.  Stable mild degenerative changes thoracic spine.  IMPRESSION: No significant change.  Stable central vascular congestion and chronic mild interstitial prominence.  No convincing pulmonary edema.  Cardiomegaly again noted.   Original Report Authenticated By: Natasha Mead, M.D.      No diagnosis found.  Date: 08/28/2012  Rate: 76  Rhythm: atrial fibrillation  QRS Axis: normal  Intervals: normal  ST/T Wave abnormalities: inverted T waves  Conduction Disutrbances:none  Narrative Interpretation:   Old EKG Reviewed: changes noted    MDM  Patient is profoundly hypoglycemic.  She's feels much better after consuming calories. Other diagnostic screening tests show no acute issues.  I discussed with patient and her family to reduce the oral hypoglycemic to the original dose        Donnetta Hutching, MD 08/28/12 2001

## 2012-08-28 NOTE — ED Notes (Signed)
Pt currently in radiology at this time.  

## 2012-08-28 NOTE — ED Notes (Signed)
AIDET performed. 

## 2012-08-28 NOTE — ED Notes (Signed)
Pt in XRAY 

## 2012-08-28 NOTE — ED Notes (Signed)
Pt reports she has been having increased sob with ambulation since yesterday afternoon.  Family reports they had pt out shopping/ trying on clothes yesterday and that she appeared to feel well.  Pt denies pain.  Alert/ oriented x 4.  Skin warm/ dry and color WNL for race.  Resps even/ unlabored on assessment.  NAD.

## 2012-08-28 NOTE — ED Notes (Signed)
Dr. Adriana Simas aware of CBG

## 2012-08-28 NOTE — ED Notes (Signed)
Pt presents to department for evaluation of SOB and generalized weakness. Ongoing x3 days. States SOB has become progressively worse and she feels tired all the time. Also states diaphoresis. Respirations unlabored. Denies pain. She is conscious alert and oriented x4.

## 2012-08-28 NOTE — ED Notes (Signed)
The patient is AOx4 and comfortable with her discharge instructions. 

## 2012-08-30 ENCOUNTER — Inpatient Hospital Stay (HOSPITAL_COMMUNITY)
Admission: EM | Admit: 2012-08-30 | Discharge: 2012-09-07 | DRG: 377 | Disposition: A | Discharge status: Home / Self Care | Payer: Medicare Other | Attending: Cardiology | Admitting: Cardiology

## 2012-08-30 ENCOUNTER — Encounter (HOSPITAL_COMMUNITY): Payer: Self-pay | Admitting: Emergency Medicine

## 2012-08-30 DIAGNOSIS — D6832 Hemorrhagic disorder due to extrinsic circulating anticoagulants: Secondary | ICD-10-CM

## 2012-08-30 DIAGNOSIS — K8020 Calculus of gallbladder without cholecystitis without obstruction: Secondary | ICD-10-CM

## 2012-08-30 DIAGNOSIS — K8050 Calculus of bile duct without cholangitis or cholecystitis without obstruction: Secondary | ICD-10-CM

## 2012-08-30 DIAGNOSIS — D696 Thrombocytopenia, unspecified: Secondary | ICD-10-CM

## 2012-08-30 DIAGNOSIS — D0510 Intraductal carcinoma in situ of unspecified breast: Secondary | ICD-10-CM

## 2012-08-30 DIAGNOSIS — Y92009 Unspecified place in unspecified non-institutional (private) residence as the place of occurrence of the external cause: Secondary | ICD-10-CM

## 2012-08-30 DIAGNOSIS — I4891 Unspecified atrial fibrillation: Secondary | ICD-10-CM

## 2012-08-30 DIAGNOSIS — D689 Coagulation defect, unspecified: Secondary | ICD-10-CM

## 2012-08-30 DIAGNOSIS — K8070 Calculus of gallbladder and bile duct without cholecystitis without obstruction: Secondary | ICD-10-CM | POA: Diagnosis present

## 2012-08-30 DIAGNOSIS — Z79899 Other long term (current) drug therapy: Secondary | ICD-10-CM

## 2012-08-30 DIAGNOSIS — T45515A Adverse effect of anticoagulants, initial encounter: Secondary | ICD-10-CM | POA: Diagnosis present

## 2012-08-30 DIAGNOSIS — Z86718 Personal history of other venous thrombosis and embolism: Secondary | ICD-10-CM

## 2012-08-30 DIAGNOSIS — K264 Chronic or unspecified duodenal ulcer with hemorrhage: Principal | ICD-10-CM

## 2012-08-30 DIAGNOSIS — I5032 Chronic diastolic (congestive) heart failure: Secondary | ICD-10-CM | POA: Diagnosis present

## 2012-08-30 DIAGNOSIS — K3189 Other diseases of stomach and duodenum: Secondary | ICD-10-CM

## 2012-08-30 DIAGNOSIS — D62 Acute posthemorrhagic anemia: Secondary | ICD-10-CM

## 2012-08-30 DIAGNOSIS — E7800 Pure hypercholesterolemia, unspecified: Secondary | ICD-10-CM

## 2012-08-30 DIAGNOSIS — Z8679 Personal history of other diseases of the circulatory system: Secondary | ICD-10-CM

## 2012-08-30 DIAGNOSIS — E785 Hyperlipidemia, unspecified: Secondary | ICD-10-CM | POA: Diagnosis present

## 2012-08-30 DIAGNOSIS — Z23 Encounter for immunization: Secondary | ICD-10-CM

## 2012-08-30 DIAGNOSIS — R578 Other shock: Secondary | ICD-10-CM | POA: Diagnosis present

## 2012-08-30 DIAGNOSIS — I509 Heart failure, unspecified: Secondary | ICD-10-CM | POA: Diagnosis present

## 2012-08-30 DIAGNOSIS — Z7901 Long term (current) use of anticoagulants: Secondary | ICD-10-CM | POA: Diagnosis present

## 2012-08-30 DIAGNOSIS — G609 Hereditary and idiopathic neuropathy, unspecified: Secondary | ICD-10-CM | POA: Diagnosis present

## 2012-08-30 DIAGNOSIS — E875 Hyperkalemia: Secondary | ICD-10-CM | POA: Diagnosis present

## 2012-08-30 DIAGNOSIS — K922 Gastrointestinal hemorrhage, unspecified: Secondary | ICD-10-CM

## 2012-08-30 DIAGNOSIS — D649 Anemia, unspecified: Secondary | ICD-10-CM

## 2012-08-30 DIAGNOSIS — Z923 Personal history of irradiation: Secondary | ICD-10-CM

## 2012-08-30 DIAGNOSIS — I10 Essential (primary) hypertension: Secondary | ICD-10-CM

## 2012-08-30 DIAGNOSIS — Z853 Personal history of malignant neoplasm of breast: Secondary | ICD-10-CM

## 2012-08-30 DIAGNOSIS — E119 Type 2 diabetes mellitus without complications: Secondary | ICD-10-CM | POA: Diagnosis present

## 2012-08-30 HISTORY — DX: Heart failure, unspecified: I50.9

## 2012-08-30 HISTORY — DX: Essential (primary) hypertension: I10

## 2012-08-30 HISTORY — DX: Hyperlipidemia, unspecified: E78.5

## 2012-08-30 LAB — URINALYSIS, ROUTINE W REFLEX MICROSCOPIC
Glucose, UA: NEGATIVE mg/dL
Nitrite: NEGATIVE
Protein, ur: NEGATIVE mg/dL
Urobilinogen, UA: 0.2 mg/dL (ref 0.0–1.0)

## 2012-08-30 LAB — BASIC METABOLIC PANEL
CO2: 21 mEq/L (ref 19–32)
Calcium: 8.6 mg/dL (ref 8.4–10.5)
GFR calc Af Amer: 47 mL/min — ABNORMAL LOW (ref >90)
GFR calc non Af Amer: 41 mL/min — ABNORMAL LOW (ref >90)
Sodium: 138 mEq/L (ref 135–145)

## 2012-08-30 LAB — MAGNESIUM: Magnesium: 2.5 mg/dL (ref 1.5–2.5)

## 2012-08-30 LAB — URINE MICROSCOPIC-ADD ON

## 2012-08-30 LAB — COMPREHENSIVE METABOLIC PANEL
Albumin: 2.6 g/dL — ABNORMAL LOW (ref 3.5–5.2)
BUN: 39 mg/dL — ABNORMAL HIGH (ref 6–23)
Creatinine, Ser: 1.21 mg/dL — ABNORMAL HIGH (ref 0.50–1.10)
GFR calc Af Amer: 47 mL/min — ABNORMAL LOW (ref >90)
Glucose, Bld: 146 mg/dL — ABNORMAL HIGH (ref 70–99)
Total Protein: 5.4 g/dL — ABNORMAL LOW (ref 6.0–8.3)

## 2012-08-30 LAB — PREPARE RBC (CROSSMATCH)

## 2012-08-30 LAB — DIGOXIN LEVEL: Digoxin Level: 1.5 ng/mL (ref 0.8–2.0)

## 2012-08-30 LAB — CBC
HCT: 20.7 % — ABNORMAL LOW (ref 36.0–46.0)
Hemoglobin: 6.8 g/dL — CL (ref 12.0–15.0)
RBC: 2.11 MIL/uL — ABNORMAL LOW (ref 3.87–5.11)
WBC: 14.6 10*3/uL — ABNORMAL HIGH (ref 4.0–10.5)

## 2012-08-30 LAB — CBC WITH DIFFERENTIAL/PLATELET
Basophils Relative: 0 % (ref 0–1)
Eosinophils Relative: 0 % (ref 0–5)
HCT: 17.8 % — ABNORMAL LOW (ref 36.0–46.0)
Hemoglobin: 5.6 g/dL — CL (ref 12.0–15.0)
Lymphocytes Relative: 21 % (ref 12–46)
Monocytes Relative: 5 % (ref 3–12)
Neutro Abs: 12.2 10*3/uL — ABNORMAL HIGH (ref 1.7–7.7)
WBC: 16.5 10*3/uL — ABNORMAL HIGH (ref 4.0–10.5)

## 2012-08-30 LAB — ABO/RH: ABO/RH(D): O POS

## 2012-08-30 LAB — GLUCOSE, CAPILLARY: Glucose-Capillary: 140 mg/dL — ABNORMAL HIGH (ref 70–99)

## 2012-08-30 MED ORDER — ONDANSETRON HCL 4 MG/2ML IJ SOLN
4.0000 mg | Freq: Once | INTRAMUSCULAR | Status: AC
  Administered 2012-08-30: 4 mg via INTRAVENOUS

## 2012-08-30 MED ORDER — FUROSEMIDE 10 MG/ML IJ SOLN
20.0000 mg | Freq: Once | INTRAMUSCULAR | Status: AC
  Administered 2012-08-30: 20 mg via INTRAVENOUS

## 2012-08-30 MED ORDER — PANTOPRAZOLE SODIUM 40 MG IV SOLR: 40.0000 mg | Freq: Once | INTRAVENOUS | Status: DC

## 2012-08-30 MED ORDER — PHYTONADIONE 5 MG PO TABS
5.0000 mg | ORAL_TABLET | Freq: Once | ORAL | Status: AC
  Administered 2012-08-30: 5 mg via ORAL
  Filled 2012-08-30: qty 1

## 2012-08-30 MED ORDER — INSULIN ASPART 100 UNIT/ML ~~LOC~~ SOLN
0.0000 [IU] | Freq: Three times a day (TID) | SUBCUTANEOUS | Status: DC
  Administered 2012-08-31: 2 [IU] via SUBCUTANEOUS
  Administered 2012-08-31 – 2012-09-02 (×3): 1 [IU] via SUBCUTANEOUS
  Administered 2012-09-02 – 2012-09-03 (×3): 2 [IU] via SUBCUTANEOUS
  Administered 2012-09-04: 1 [IU] via SUBCUTANEOUS
  Administered 2012-09-04: 2 [IU] via SUBCUTANEOUS
  Administered 2012-09-04: 1 [IU] via SUBCUTANEOUS
  Administered 2012-09-05: 2 [IU] via SUBCUTANEOUS
  Administered 2012-09-05 (×2): 1 [IU] via SUBCUTANEOUS
  Administered 2012-09-06: 3 [IU] via SUBCUTANEOUS
  Administered 2012-09-06: 2 [IU] via SUBCUTANEOUS
  Administered 2012-09-07: 3 [IU] via SUBCUTANEOUS
  Administered 2012-09-07: 2 [IU] via SUBCUTANEOUS

## 2012-08-30 MED ORDER — PANTOPRAZOLE SODIUM 40 MG IV SOLR
40.0000 mg | Freq: Two times a day (BID) | INTRAVENOUS | Status: DC
  Administered 2012-08-30 – 2012-09-07 (×16): 40 mg via INTRAVENOUS
  Filled 2012-08-30 (×24): qty 40

## 2012-08-30 MED ORDER — SODIUM CHLORIDE 0.9 % IV SOLN: 250.0000 mL | INTRAVENOUS | Status: DC | PRN

## 2012-08-30 MED ORDER — SODIUM CHLORIDE 0.9 % IV SOLN
INTRAVENOUS | Status: DC
  Administered 2012-08-30: 21:00:00 via INTRAVENOUS
  Administered 2012-08-31: 75 mL/h via INTRAVENOUS
  Administered 2012-09-04: 17:00:00 via INTRAVENOUS
  Administered 2012-09-06: 1000 mL via INTRAVENOUS

## 2012-08-30 MED ORDER — FUROSEMIDE 10 MG/ML IJ SOLN
INTRAMUSCULAR | Status: AC
  Filled 2012-08-30: qty 4

## 2012-08-30 MED ORDER — ONDANSETRON HCL 4 MG/2ML IJ SOLN
INTRAMUSCULAR | Status: AC
  Filled 2012-08-30: qty 2

## 2012-08-30 NOTE — ED Notes (Signed)
Panic lab values reported to Dr.Wickline

## 2012-08-30 NOTE — ED Notes (Signed)
2nd IV team RN called to evaluate for IV access

## 2012-08-30 NOTE — ED Notes (Signed)
FFP restarted per MD through femoral CVC. Cardiologist at bedside.

## 2012-08-30 NOTE — H&P (Signed)
[name] is an 78 y.o. female.   Chief Complaint: Generalized weakness shortness of breath HPI: Patient is 78 year old female with past medical history significant for hypertension, non-insulin-dependent diabetes mellitus, chronic atrial fibrillation, history of DVT, peripheral neuropathy, degenerative joint disease, history of CVA of breast history of congestive heart failure secondary to diastolic dysfunction, came to the ER because of generalized weakness associated with shortness of breath and black tarry stool since yesterday and was noted to have hemoglobin of 5.6 with INR of 5.6. Patient was noted to be hypotensive and received fluid challenge and is in process of getting packed RBCs FFP and vitamin K. Patient denies any chest pain nausea vomiting diaphoresis denies any abdominal pain. Patient was seen in ER 2 days ago with episode of hypoglycemia and was noted to have INR of 4.9. Patient denies any palpitation lightheadedness or syncope. Denies any cough fever chills. Denies any extra dose of Coumadin recently. Denies any antibiotics use recently.  Past Medical History  Diagnosis Date  . 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 DVT (deep venous thrombosis)   . Peripheral neuropathy   . Arthritis   . Diabetes mellitus   . Cancer     DCIS s/p lumpectomy and radiation 2011  . Hyperlipidemia   . Hypertension   . CHF (congestive heart failure)     Past Surgical History  Procedure Date  . Breast surgery     2012    History reviewed. No pertinent family history. Social History:  reports that she has never smoked. She has never used smokeless tobacco. She reports that she does not drink alcohol or use illicit drugs.  Allergies:  Allergies  Allergen Reactions  . Peach Flavor     "rash"     (Not in a hospital admission)  Results for orders placed during the hospital encounter of 08/30/12 (from the past 48 hour(s))  GLUCOSE, CAPILLARY     Status: Abnormal   Collection Time   08/30/12 11:07 AM      Component Value Range Comment   Glucose-Capillary 223 (*) 70 - 99 mg/dL    Comment 1 Documented in Chart      Comment 2 Notify RN     BASIC METABOLIC PANEL     Status: Abnormal   Collection Time   08/30/12 11:27 AM      Component Value Range Comment   Sodium 138  135 - 145 mEq/L    Potassium 5.6 (*) 3.5 - 5.1 mEq/L    Chloride 105  96 - 112 mEq/L    CO2 21  19 - 32 mEq/L    Glucose, Bld 248 (*) 70 - 99 mg/dL    BUN 36 (*) 6 - 23 mg/dL    Creatinine, Ser 1.32 (*) 0.50 - 1.10 mg/dL    Calcium 8.6  8.4 - 44.0 mg/dL    GFR calc non Af Amer 41 (*) >90 mL/min    GFR calc Af Amer 47 (*) >90 mL/min   DIGOXIN LEVEL     Status: Normal   Collection Time   08/30/12 11:27 AM      Component Value Range Comment   Digoxin Level 1.5  0.8 - 2.0 ng/mL   PROTIME-INR     Status: Abnormal   Collection Time   08/30/12 11:27 AM      Component Value Range Comment   Prothrombin Time 47.0 (*) 11.6 - 15.2 seconds REPEATED TO VERIFY   INR 5.61 (*) 0.00 -  1.49   CBC WITH DIFFERENTIAL     Status: Abnormal   Collection Time   08/30/12 11:53 AM      Component Value Range Comment   WBC 16.5 (*) 4.0 - 10.5 K/uL    RBC 1.65 (*) 3.87 - 5.11 MIL/uL    Hemoglobin 5.6 (*) 12.0 - 15.0 g/dL    HCT 16.1 (*) 09.6 - 46.0 %    MCV 107.9 (*) 78.0 - 100.0 fL    MCH 33.9  26.0 - 34.0 pg    MCHC 31.5  30.0 - 36.0 g/dL    RDW 04.5 (*) 40.9 - 15.5 %    Platelets 157  150 - 400 K/uL DELTA CHECK NOTED   Neutrophils Relative 74  43 - 77 %    Lymphocytes Relative 21  12 - 46 %    Monocytes Relative 5  3 - 12 %    Eosinophils Relative 0  0 - 5 %    Basophils Relative 0  0 - 1 %    Neutro Abs 12.2 (*) 1.7 - 7.7 K/uL    Lymphs Abs 3.5  0.7 - 4.0 K/uL    Monocytes Absolute 0.8  0.1 - 1.0 K/uL    Eosinophils Absolute 0.0  0.0 - 0.7 K/uL    Basophils Absolute 0.0  0.0 - 0.1 K/uL    RBC Morphology MARKED POLYCHROMASIA   ELLIPTOCYTES  TYPE AND SCREEN     Status: Normal (Preliminary result)    Collection Time   08/30/12 12:00 PM      Component Value Range Comment   ABO/RH(D) O POS      Antibody Screen NEG      Sample Expiration 09/02/2012      Unit Number [id number]      Blood Component Type RBC LR PHER2      Unit division 00      Status of Unit ALLOCATED      Transfusion Status OK TO TRANSFUSE      Crossmatch Result Compatible      Unit Number [id number]      Blood Component Type RED CELLS,LR      Unit division 00      Status of Unit ALLOCATED      Transfusion Status OK TO TRANSFUSE      Crossmatch Result Compatible     ABO/RH     Status: Normal   Collection Time   08/30/12 12:00 PM      Component Value Range Comment   ABO/RH(D) O POS     OCCULT BLOOD, POC DEVICE     Status: Normal   Collection Time   08/30/12 12:36 PM      Component Value Range Comment   Fecal Occult Bld POSITIVE     PREPARE FRESH FROZEN PLASMA     Status: Normal (Preliminary result)   Collection Time   08/30/12  1:00 PM      Component Value Range Comment   Unit Number [id number]      Blood Component Type THAWED PLASMA      Unit division 00      Status of Unit ISSUED      Transfusion Status OK TO TRANSFUSE      Unit Number [id number]      Blood Component Type THAWED PLASMA      Unit division 00      Status of Unit ALLOCATED      Transfusion Status OK TO TRANSFUSE     PREPARE RBC (  CROSSMATCH)     Status: Normal   Collection Time   08/30/12  1:18 PM      Component Value Range Comment   Order Confirmation ORDER PROCESSED BY BLOOD BANK      Dg Chest 2 View  08/28/2012  *RADIOLOGY REPORT*  Clinical Data: Shortness of breath  CHEST - 2 VIEW  Comparison: 02/16/2012  Findings: Cardiomegaly again noted.  Stable central mild vascular congestion and chronic mild interstitial prominence.  No convincing pulmonary edema.  No focal infiltrate.  Stable mild degenerative changes thoracic spine.  IMPRESSION: No significant change.  Stable central vascular congestion and chronic mild interstitial  prominence.  No convincing pulmonary edema.  Cardiomegaly again noted.   Original Report Authenticated By: Natasha Mead, M.D.     Review of Systems  Constitutional: Positive for malaise/fatigue. Negative for fever and chills.  Respiratory: Positive for shortness of breath. Negative for cough, hemoptysis and sputum production.   Cardiovascular: Negative for chest pain, palpitations and orthopnea.  Gastrointestinal: Positive for nausea, vomiting and melena. Negative for abdominal pain.  Genitourinary: Negative for dysuria and urgency.  Neurological: Positive for dizziness and weakness.    Blood pressure 72/52, pulse 77, resp. rate 20, height 5\' 3"  (1.6 m), weight 108.863 kg (240 lb), SpO2 96.00%. Physical Exam  Constitutional: She is oriented to person, place, and time.  HENT:  Head: Normocephalic and atraumatic.  Eyes:       Conjunctivae were pale sclerae nonicteric  Neck: Neck supple. No JVD present. No tracheal deviation present. No thyromegaly present.  Cardiovascular:       Irregularly irregular S1-S2 normal there was 2/6 systolic murmur no S3 gallop  Respiratory: Effort normal and breath sounds normal. No respiratory distress. She has no wheezes. She has no rales.  GI: Soft. Bowel sounds are normal. She exhibits distension. There is no tenderness. There is no rebound.  Musculoskeletal:       No clubbing cyanosis 1+ edema  Neurological: She is alert and oriented to person, place, and time.     Assessment/Plan Acute upper GI bleeding Coumadin toxicity Hypotensive shock secondary to #1 Hypertension Non-insulin-dependent diabetes mellitus Chronic atrial fibrillation History of DVT Degenerative joint disease History of CA of breast Acute anemia secondary to #1 Plan As per orders GI consult Agree with packed RBCs FFP's and vitamin K Check serial H&H  Kierre Hintz N 08/30/2012, 3:24 PM

## 2012-08-30 NOTE — ED Notes (Addendum)
FFP stopped by RN per MD. Pt c/o CP and nausea. MD in room. FFP not completed.

## 2012-08-30 NOTE — ED Notes (Addendum)
Pt more oriented than previous assessment at this time. Family reports that pt is "more like herself"

## 2012-08-30 NOTE — ED Notes (Signed)
Pt CBG checked: 187

## 2012-08-30 NOTE — ED Notes (Signed)
Pt c/o HA in center of forehead that she states started "a few minutes ago"

## 2012-08-30 NOTE — ED Notes (Signed)
Pt reported she "don't feel well" with nausea and vomiting. MD notified

## 2012-08-30 NOTE — ED Provider Notes (Addendum)
History     CSN: [id number]  Arrival date & time 08/30/12  1033   First MD Initiated Contact with Patient 08/30/12 1119      Chief Complaint  Patient presents with  . Blood Sugar Problem     Patient is a 78 y.o. female presenting with weakness. The history is provided by the patient and a relative.  Weakness The primary symptoms include dizziness and nausea. Primary symptoms do not include headaches, loss of consciousness, focal weakness, fever or vomiting. Episode onset: earlier today. The symptoms are unchanged. The neurological symptoms are diffuse.  Dizziness also occurs with nausea and weakness. Dizziness does not occur with vomiting.   Additional symptoms include weakness.  pt presents from home for generalized weakness.  No cp.  She reports mild short of breath.  She reports nausea but no vomiting.  She has had dark stools for past day.  No abdominal pain is reported She was recently seen for hypoglycemia in the Ed, now she has hyperglycemia No syncope is reported  Past Medical History  Diagnosis Date  . 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 DVT (deep venous thrombosis)   . Peripheral neuropathy   . Arthritis   . Diabetes mellitus   . Cancer     DCIS s/p lumpectomy and radiation 2011    Past Surgical History  Procedure Date  . Breast surgery     2012    History reviewed. No pertinent family history.  History  Substance Use Topics  . Smoking status: Never Smoker   . Smokeless tobacco: Never Used  . Alcohol Use: No    OB History    Grav Para Term Preterm Abortions TAB SAB Ect Mult Living                  Review of Systems  Constitutional: Negative for fever.  Gastrointestinal: Positive for nausea. Negative for vomiting.  Neurological: Positive for dizziness and weakness. Negative for focal weakness, loss of consciousness and headaches.  All other systems reviewed and are negative.    Allergies  Peach flavor  Home Medications    Current Outpatient Rx  Name Route Sig Dispense Refill  . AMLODIPINE BESYLATE-VALSARTAN 5-320 MG PO TABS Oral Take 1 tablet by mouth daily.    . ASPIRIN 81 MG PO TABS Oral Take 81 mg by mouth daily.    . ATENOLOL 50 MG PO TABS Oral Take 50 mg by mouth daily.    . ATORVASTATIN CALCIUM 20 MG PO TABS Oral Take 20 mg by mouth daily.    . CELECOXIB 200 MG PO CAPS Oral Take 200 mg by mouth 2 (two) times daily.    Marland Kitchen DIGOXIN 0.125 MG PO TABS Oral Take 125 mcg by mouth daily.    Marland Kitchen EXEMESTANE 25 MG PO TABS Oral Take 25 mg by mouth daily after breakfast.    . FUROSEMIDE 40 MG PO TABS Oral Take 40 mg by mouth daily.    Marland Kitchen GLIMEPIRIDE 2 MG PO TABS Oral Take 2 mg by mouth daily before breakfast.    . POTASSIUM CHLORIDE CRYS ER 20 MEQ PO TBCR Oral Take 20 mEq by mouth 2 (two) times daily.    . WARFARIN SODIUM 2 MG PO TABS Oral Take 2-4 mg by mouth daily. Takes 4mg  (2 tablets) on Thurs and Saturday and all other days takes 2 mg      Ht 5\' 3"  (1.6 m)  Wt 240 lb (108.863 kg)  BMI 42.51  kg/m2  Physical Exam CONSTITUTIONAL: Well developed/well nourished HEAD AND FACE: Normocephalic/atraumatic EYES: EOMI/PERRL ENMT: Mucous membranes moist NECK: supple no meningeal signs SPINE:entire spine nontender CV: irregular rhythm noted LUNGS: Lungs are clear to auscultation bilaterally, no apparent distress ABDOMEN: soft, nontender, no rebound or guarding GU:no cva tenderness Rectal - stool color black and +hemoccult, chaperone present NEURO: Pt is awake/alert, moves all extremitiesx4, answers appropriately EXTREMITIES: pulses normal, full ROM SKIN: warm, color normal PSYCH: no abnormalities of mood noted  ED Course  CENTRAL LINE Performed by: Joya Gaskins Authorized by: Joya Gaskins Consent: Verbal consent obtained. Written consent obtained. Risks and benefits: risks, benefits and alternatives were discussed Consent given by: patient Patient identity confirmed: verbally with patient Time  out: Immediately prior to procedure a "time out" was called to verify the correct patient, procedure, equipment, support staff and site/side marked as required. Indications: vascular access Anesthesia: local infiltration Local anesthetic: lidocaine 1% without epinephrine Anesthetic total: 4 ml Patient sedated: no Preparation: skin prepped with 2% chlorhexidine Sterile barriers: all five maximum sterile barriers used - cap, mask, sterile gown, sterile gloves, and large sterile sheet Hand hygiene: hand hygiene performed prior to central venous catheter insertion Location details: right femoral Site selection rationale: urgent need for access and patient hypotensive and anticoagulated Catheter type: triple lumen Pre-procedure: landmarks identified Ultrasound guidance: yes Number of attempts: 1 Successful placement: yes Post-procedure: line sutured and dressing applied Assessment: blood return through all parts Patient tolerance: Patient tolerated the procedure well with no immediate complications.    CRITICAL CARE Performed by: Joya Gaskins   Total critical care time: 50  Critical care time was exclusive of separately billable procedures and treating other patients.  Critical care was necessary to treat or prevent imminent or life-threatening deterioration.  Critical care was time spent personally by me on the following activities: development of treatment plan with patient and/or surrogate as well as nursing, discussions with consultants, evaluation of patient's response to treatment, examination of patient, obtaining history from patient or surrogate, ordering and performing treatments and interventions, ordering and review of laboratory studies, ordering and review of radiographic studies, pulse oximetry and re-evaluation of patient's condition.   Labs Reviewed  GLUCOSE, CAPILLARY - Abnormal; Notable for the following:    Glucose-Capillary 223 (*)     All other components  within normal limits  CBC WITH DIFFERENTIAL  BASIC METABOLIC PANEL  URINALYSIS, ROUTINE W REFLEX MICROSCOPIC  DIGOXIN LEVEL  PROTIME-INR   12:01 PM Pt with generalized fatigue, has hem positive stools and she is on coumadin.  IV ordered, also ordered type and screen Pt has no objections to receiving blood products Family reports she has had EGD previously but unsure who performed this procedure 12:54 PM Pt found to have GI bleed inr elevated and anemic She is hemodynamically stable at this time Vitamin K ordered FFP ordered (pt agreed to have blood prodcuts) I have gained IV access in left EJ D/w [facility], and will admit to medicine  1:22 PM D/w dr Sharyn Lull, her PCP , will admit to stepdown We agree to give FFP and PRBC.  He will give lasix as she has h/o CHF Pt stable att his time BP is appropriate   3:11 PM BP started to drop in 80s She vomited small amt of blood Central line placed  3:13 PM BP improved, seen by dr Sharyn Lull Her BP dropped another time.  Will re-consult harwani as patient may need ICU care Pt protecting airway at this  time    MDM  Nursing notes including past medical history and social history reviewed and considered in documentation Previous records reviewed and considered - recent ED visit for hypoglycemia Labs/vital reviewed and considered        Date: 08/30/2012  Rate: 72  Rhythm: atrial fibrillation  QRS Axis: normal  Intervals: normal  ST/T Wave abnormalities: nonspecific ST changes  Conduction Disutrbances:none  Narrative Interpretation:   Old EKG Reviewed: unchanged    Joya Gaskins, MD 08/30/12 1323  Joya Gaskins, MD 08/30/12 1513

## 2012-08-30 NOTE — ED Notes (Signed)
Pt brought in via EMS from home with family c/o pt being lethargic and high CBG. Pt seen in [facility] on 08/28/12 for c/o hypoglycemia. Pt alert/oriented x4, pt states "i don't feel good" can not give details and does not answer questions when ask by RN. Family at bedside.

## 2012-08-30 NOTE — ED Notes (Signed)
Family in hallway. Cardiology at bedside with Dr.Wickline

## 2012-08-30 NOTE — ED Notes (Addendum)
IV team at bedside. Unable to obtain peripheral IV access on left side. MD notified and orders given to use right arm.

## 2012-08-30 NOTE — Consult Note (Signed)
Referring Provider: No ref. provider found Primary Care Physician:  Robynn Pane, MD Primary Gastroenterologist:  None  Reason for Consultation:  Melena; acute blood loss anemia  HPI: [name] is a 78 y.o. female who presented to the ED here at [facility] hospital on 9/21 with complaints of SOB and fatigue.  At that time her Hgb was 9.6 grams, but she was severely hypoglycemic with a BS of 31.  She was treated and discharged home.    She returns today with similar complaints of SOB and weakness.  Has now been passing black stools for the last day or so as well.  Last bowel movement before 10am today.  Hgb has dropped down to 5.6 grams in just two days time.  She is on coumadin, and her INR is 5.6 as well.  They have given her Vitamin K and have ordered FFP as well.  Is receiving two units of PRBC's.  She is FOBT positive with black stools, according to the ED report.  Says that she was vomiting this morning but it was just yellow bile, no blood or coffee ground material.  No complaints of abdominal pain, decreased appetite or weight loss.  No issues with heartburn/reflux or difficult/painful swallowing at home.  Had colonoscopy several years ago, likely 2004 or 2005, but those records are not available to view at this time.  No family history of GI malignancy to her knowledge.  She not only takes coumadin at home, but also takes asa 81 mg daily and celebrex 200 mg two times daily.   Past Medical History  Diagnosis Date  . DVT (deep venous thrombosis)   . Peripheral neuropathy   . Arthritis   . Diabetes mellitus   . Cancer   *  Hypertenstion  *  Hyperlipidemia  *  CHF    Past Surgical History  Procedure Date  . Breast surgery     2012    Prior to Admission medications   Medication Sig Start Date End Date Taking? Authorizing Provider  amLODipine-valsartan (EXFORGE) 5-320 MG per tablet Take 1 tablet by mouth daily.   Yes Historical Provider, MD  aspirin 81 MG tablet Take 81 mg by  mouth daily.   Yes Historical Provider, MD  atenolol (TENORMIN) 50 MG tablet Take 50 mg by mouth daily.   Yes Historical Provider, MD  atorvastatin (LIPITOR) 20 MG tablet Take 20 mg by mouth daily.   Yes Historical Provider, MD  celecoxib (CELEBREX) 200 MG capsule Take 200 mg by mouth 2 (two) times daily.   Yes Historical Provider, MD  digoxin (LANOXIN) 0.125 MG tablet Take 125 mcg by mouth daily.   Yes Historical Provider, MD  exemestane (AROMASIN) 25 MG tablet Take 25 mg by mouth daily after breakfast.   Yes Historical Provider, MD  furosemide (LASIX) 40 MG tablet Take 40 mg by mouth daily.   Yes Historical Provider, MD  glimepiride (AMARYL) 2 MG tablet Take 2 mg by mouth daily before breakfast.   Yes Historical Provider, MD  potassium chloride SA (K-DUR,KLOR-CON) 20 MEQ tablet Take 20 mEq by mouth 2 (two) times daily.   Yes Historical Provider, MD  warfarin (COUMADIN) 2 MG tablet Take 2-4 mg by mouth daily. Takes 4mg  (2 tablets) on Thurs and Saturday and all other days takes 2 mg   Yes Historical Provider, MD    Current Facility-Administered Medications  Medication Dose Route Frequency Provider Last Rate Last Dose  . pantoprazole (PROTONIX) injection 40 mg  40 mg  Intravenous Q12H Doug Sou, PA      . phytonadione (VITAMIN K) tablet 5 mg  5 mg Oral Once Joya Gaskins, MD   5 mg at 08/30/12 1258  . DISCONTD: pantoprazole (PROTONIX) injection 40 mg  40 mg Intravenous Once Joya Gaskins, MD       Current Outpatient Prescriptions  Medication Sig Dispense Refill  . amLODipine-valsartan (EXFORGE) 5-320 MG per tablet Take 1 tablet by mouth daily.      Marland Kitchen aspirin 81 MG tablet Take 81 mg by mouth daily.      Marland Kitchen atenolol (TENORMIN) 50 MG tablet Take 50 mg by mouth daily.      Marland Kitchen atorvastatin (LIPITOR) 20 MG tablet Take 20 mg by mouth daily.      . celecoxib (CELEBREX) 200 MG capsule Take 200 mg by mouth 2 (two) times daily.      . digoxin (LANOXIN) 0.125 MG tablet Take 125 mcg by mouth  daily.      Marland Kitchen exemestane (AROMASIN) 25 MG tablet Take 25 mg by mouth daily after breakfast.      . furosemide (LASIX) 40 MG tablet Take 40 mg by mouth daily.      Marland Kitchen glimepiride (AMARYL) 2 MG tablet Take 2 mg by mouth daily before breakfast.      . potassium chloride SA (K-DUR,KLOR-CON) 20 MEQ tablet Take 20 mEq by mouth 2 (two) times daily.      Marland Kitchen warfarin (COUMADIN) 2 MG tablet Take 2-4 mg by mouth daily. Takes 4mg  (2 tablets) on Thurs and Saturday and all other days takes 2 mg        Allergies as of 08/30/2012 - Review Complete 08/30/2012  Allergen Reaction Noted  . Peach flavor  08/30/2012    History reviewed. No pertinent family history.  History   Social History  . Marital Status: Widowed    Spouse Name: N/A    Number of Children: N/A  . Years of Education: N/A   Occupational History  . Not on file.   Social History Main Topics  . Smoking status: Never Smoker   . Smokeless tobacco: Never Used  . Alcohol Use: No  . Drug Use: No  . Sexually Active: Not Currently   Other Topics Concern  . Not on file   Social History Narrative  . No narrative on file    Review of Systems: Ten point ROS O/W negative except as mentioned in HPI.  Physical Exam: Vital signs in last 24 hours: Pulse Rate:  [61-108] 97  (09/23 1245) Resp:  [17-28] 21  (09/23 1315) BP: (86-117)/(24-80) 117/52 mmHg (09/23 1315) SpO2:  [89 %-100 %] 100 % (09/23 1245) Weight:  [240 lb (108.863 kg)] 240 lb (108.863 kg) (09/23 1045)   General:   Alert,  Well-developed, well-nourished, pleasant and cooperative in NAD. Head:  Normocephalic and atraumatic. Eyes:  Sclera clear, no icterus.  Conjunctiva pale. Ears:  Normal auditory acuity. Mouth:  No deformity or lesions.   Lungs:  Clear throughout to auscultation.  No wheezes, crackles, or rhonchi.  Heart:  Regular rate and rhythm; no murmurs, clicks, rubs,  or gallops. Abdomen:  Soft, non-distended, nontender, BS active,nonpalp mass or hsm.   Rectal:   Deferred.  Black heme positive stools per ED report.  Msk:  Symmetrical without gross deformities. Pulses:  Normal pulses noted. Extremities:  +1-2 B/L LE pitting edema. Neurologic:  Alert and  oriented x4;  grossly normal neurologically. Skin:  Intact without significant lesions or rashes.  Psych:  Alert and cooperative. Normal mood and affect.  Lab Results:  Basename 08/30/12 1153 08/28/12 1551  WBC 16.5* 9.3  HGB 5.6* 9.6*  HCT 17.8* 30.4*  PLT 157 221   BMET  Basename 08/30/12 1127 08/28/12 1551  NA 138 142  K 5.6* 3.8  CL 105 105  CO2 21 27  GLUCOSE 248* 31*  BUN 36* 17  CREATININE 1.20* 1.12*  CALCIUM 8.6 9.0   LFT  Basename 08/28/12 1551  PROT 7.8  ALBUMIN 3.7  AST 31  ALT 14  ALKPHOS 98  BILITOT 0.7  BILIDIR --  IBILI --   PT/INR  Basename 08/30/12 1127 08/28/12 1658  LABPROT 47.0* 42.6*  INR 5.61* 4.91*   IMPRESSION:  -GIB:  Likely upper source with black stools and NSAID use. -ABL anemia:  Due to the above.  4 gram drop in 2 days.  Receiving two units of PRBC's. -Supra-therapeutic INR:  Being reversed with Vitamin K and FFP. -History of DVT and need for coumadin. -Hyperkalemia:  Per primary team. -NSAID use  PLAN: -Will start IV pantoprazole 40 mg BID. -Agree with INR reversal and transfusion of PRBC's. -Will likely need an EGD, further recommendations will given by Dr. Russella Dar.  ZEHR, JESSICA D.  08/30/2012, 2:10 PM Pager number [phone]   I have taken a history, examined the patient and reviewed the chart. I agree with the extender's note, impression and recommendations. EGD when INR around 2.0 or less. Transfusions, correct coagulopathy and IV PPI. If Coumadin or other anticoagulants are used I would avoid using ASA, NSAIDS, celebrex.  Meryl Dare MD [facility]

## 2012-08-30 NOTE — ED Notes (Signed)
Dr.Wickline at bedside inserting central line right femoral. Pt alert/oriented, NAD.

## 2012-08-30 NOTE — ED Notes (Signed)
Dr Bebe Shaggy notified of patient labs

## 2012-08-30 NOTE — ED Notes (Signed)
Pt confused stating "I am going home" pulling at her gown and EKG leads.

## 2012-08-31 ENCOUNTER — Encounter (HOSPITAL_COMMUNITY): Payer: Self-pay | Admitting: *Deleted

## 2012-08-31 LAB — BASIC METABOLIC PANEL
CO2: 27 mEq/L (ref 19–32)
GFR calc non Af Amer: 41 mL/min — ABNORMAL LOW (ref >90)
Glucose, Bld: 109 mg/dL — ABNORMAL HIGH (ref 70–99)
Potassium: 4.7 mEq/L (ref 3.5–5.1)
Sodium: 143 mEq/L (ref 135–145)

## 2012-08-31 LAB — CBC
HCT: 27.4 % — ABNORMAL LOW (ref 36.0–46.0)
Hemoglobin: 9.1 g/dL — ABNORMAL LOW (ref 12.0–15.0)
MCH: 31 pg (ref 26.0–34.0)
MCHC: 33.2 g/dL (ref 30.0–36.0)
RBC: 2.94 MIL/uL — ABNORMAL LOW (ref 3.87–5.11)

## 2012-08-31 LAB — PROTIME-INR
INR: 3.62 — ABNORMAL HIGH (ref 0.00–1.49)
INR: 2.86 — ABNORMAL HIGH (ref 0.00–1.49)
Prothrombin Time: 28.5 seconds — ABNORMAL HIGH (ref 11.6–15.2)

## 2012-08-31 LAB — HEMOGLOBIN AND HEMATOCRIT, BLOOD: HCT: 20.7 % — ABNORMAL LOW (ref 36.0–46.0)

## 2012-08-31 LAB — GLUCOSE, CAPILLARY
Glucose-Capillary: 187 mg/dL — ABNORMAL HIGH (ref 70–99)
Glucose-Capillary: 97 mg/dL (ref 70–99)
Glucose-Capillary: 131 mg/dL — ABNORMAL HIGH (ref 70–99)
Glucose-Capillary: 156 mg/dL — ABNORMAL HIGH (ref 70–99)

## 2012-08-31 MED ORDER — FUROSEMIDE 10 MG/ML IJ SOLN
INTRAMUSCULAR | Status: AC
  Administered 2012-09-01: 20 mg via INTRAVENOUS
  Filled 2012-08-31: qty 4

## 2012-08-31 MED ORDER — FUROSEMIDE 10 MG/ML IJ SOLN
20.0000 mg | Freq: Once | INTRAMUSCULAR | Status: AC
  Administered 2012-09-01: 20 mg via INTRAVENOUS

## 2012-08-31 MED ORDER — PHYTONADIONE 5 MG PO TABS
5.0000 mg | ORAL_TABLET | Freq: Once | ORAL | Status: AC
  Administered 2012-08-31: 5 mg via ORAL
  Filled 2012-08-31: qty 1

## 2012-08-31 NOTE — Progress Notes (Signed)
Subjective:  Patient denies any chest pain shortness of breath denies any abdominal pain denies any urinary complaints denies fever chills. Scheduled for upper endoscopy today Objective:  Vital Signs in the last 24 hours: Temp:  [97.8 F (36.6 C)-98.4 F (36.9 C)] 98.4 F (36.9 C) (09/24 0737) Pulse Rate:  [47-129] 129  (09/24 0900) Resp:  [14-32] 19  (09/24 0900) BP: (58-144)/(24-105) 116/73 mmHg (09/24 0900) SpO2:  [78 %-100 %] 99 % (09/24 0900) Weight:  [108.4 kg (238 lb 15.7 oz)-108.863 kg (240 lb)] 108.4 kg (238 lb 15.7 oz) (09/24 0600)  Intake/Output from previous day: 09/23 0701 - 09/24 0700 In: 1919.5 [P.O.:120; I.V.:700; Blood:1087.5; IV Piggyback:12] Out: 1525 [Urine:1525] Intake/Output from this shift: Total I/O In: 40 [I.V.:40] Out: -   Physical Exam: Neck: no adenopathy, no carotid bruit, no JVD and supple, symmetrical, trachea midline Lungs: Decreased breath sound at bases clear anterolaterally Heart: irregularly irregular rhythm, S1, S2 normal and Soft systolic murmur noted Abdomen: soft, non-tender; bowel sounds normal; no masses,  no organomegaly Extremities: extremities normal, atraumatic, no cyanosis or edema  Lab Results:  Basename 08/31/12 0800 08/31/12 0100 08/30/12 1946  WBC -- 14.0* 14.6*  HGB 8.4* 9.1* --  PLT -- 116* 120*    Basename 08/31/12 0502 08/30/12 1946  NA 143 141  K 4.7 5.1  CL 110 110  CO2 27 27  GLUCOSE 109* 146*  BUN 41* 39*  CREATININE 1.19* 1.21*    Basename 08/28/12 1657  TROPONINI <0.30   Hepatic Function Panel  Basename 08/30/12 1946  PROT 5.4*  ALBUMIN 2.6*  AST 19  ALT 11  ALKPHOS 52  BILITOT 0.5  BILIDIR --  IBILI --   No results found for this basename: CHOL in the last 72 hours No results found for this basename: PROTIME in the last 72 hours  Imaging: Imaging results have been reviewed and No results found.  Cardiac Studies:  Assessment/Plan:  Acute upper GI bleeding  Coumadin toxicity  Status  post Hypotensive shock secondary to #1  Hypertension  Non-insulin-dependent diabetes mellitus  Chronic atrial fibrillation  History of DVT  Degenerative joint disease  History of CA of breast  Acute anemia secondary to #1 Plan Continue present management Follow serial H&H Schedule for EGD today  LOS: 1 day    [name] N 08/31/2012, 9:59 AM

## 2012-08-31 NOTE — Progress Notes (Signed)
CRITICAL VALUE ALERT  Critical value received: Hgb 6.8  Date of notification:  08/31/2012  Time of notification:  2100pm  Critical value read back:yes  Nurse who received alert:  L Akima Slaugh RN  MD notified (1st page):  Harwani   Time of first page:  2045pm  Responding MD:  Sharyn Lull  Time MD responded: [phone]

## 2012-08-31 NOTE — Progress Notes (Signed)
egd postponed due to increased coags. Have increased the IVF Jennye Moccasin

## 2012-08-31 NOTE — Progress Notes (Signed)
CRITICAL VALUE ALERT  Critical value received:  Hgb 6.8  Date of notification:  08/30/12  Time of notification:  Panic value not called by lab  Critical value read back:yes  Nurse who received alert:  L Nikya Busler RN  MD notified (1st page):  Harwani  Time of first page:  2030pm  Responding MD:  Sharyn Lull  Time MD responded: 2040pm

## 2012-08-31 NOTE — Progress Notes (Signed)
[facility] Progress Note  Subjective:  Feels better today since getting some blood.  Says that she has not had a BM since she has been at the hospital.  Objective:  Vital signs in last 24 hours: Temp:  [97.8 F (36.6 C)-98.4 F (36.9 C)] 98.4 F (36.9 C) (09/24 0737) Pulse Rate:  [47-129] 129  (09/24 0900) Resp:  [14-32] 19  (09/24 0900) BP: (58-144)/(24-105) 116/73 mmHg (09/24 0900) SpO2:  [78 %-100 %] 99 % (09/24 0900) Weight:  [238 lb 15.7 oz (108.4 kg)-240 lb (108.863 kg)] 238 lb 15.7 oz (108.4 kg) (09/24 0600) Last BM Date: 08/30/12 General:   Alert, Well-developed, in NAD Heart:  Tachycardic, irregularly irregular; no murmurs. Pulm:  CTAB.  No W/R/R. Abdomen:  Soft, nontender and nondistended. Normal bowel sounds, without guarding, and without rebound.   Extremities:  +1 pitting edema in B/L LE's. Neurologic:  Alert and  oriented x4;  grossly normal neurologically. Psych:  Alert and cooperative. Normal mood and affect.  Intake/Output from previous day: 09/23 0701 - 09/24 0700 In: 1919.5 [P.O.:120; I.V.:700; Blood:1087.5; IV Piggyback:12] Out: 1525 [Urine:1525]  Lab Results:  [facility] 08/31/12 0800 08/31/12 0100 08/30/12 1946 08/30/12 1153  WBC -- 14.0* 14.6* 16.5*  HGB 8.4* 9.1* 6.8* --  HCT 25.3* 27.4* 20.7* --  PLT -- 116* 120* 157   BMET  Basename 08/31/12 0502 08/30/12 1946 08/30/12 1127  NA 143 141 138  K 4.7 5.1 5.6*  CL 110 110 105  CO2 27 27 21   GLUCOSE 109* 146* 248*  BUN 41* 39* 36*  CREATININE 1.19* 1.21* 1.20*  CALCIUM 8.3* 8.7 8.6   LFT  Basename 08/30/12 1946  PROT 5.4*  ALBUMIN 2.6*  AST 19  ALT 11  ALKPHOS 52  BILITOT 0.5  BILIDIR --  IBILI --   PT/INR  Basename 08/31/12 0100 08/30/12 1127  LABPROT 34.0* 47.0*  INR 3.62* 5.61*   Assessment / Plan: -GIB: Likely upper source with black stools and NSAID use.  -ABL anemia: Due to the above.  S/P transfusion of PRBC's. Hb has increased to 8.4. -Supra-therapeutic INR:  Received some Vitamin K and FFP however INR is still elevated at 3.62.  -History of DVT and need for coumadin.  -Hyperkalemia: Has been corrected. -NSAID use  *Continue IV PPI BID. *EGD rescheduled for tomorrow as INR needs to be <2.0.  I spoke with Dr. Sharyn Lull who asked me to place orders for Vitamin K 5 mg orally and 2 units of FFP.  Will recheck INR prior to procedure. *Monitor Hgb and transfuse further if needed.   LOS: 1 day   ZEHR, JESSICA D.  08/31/2012, 9:45 AM  Pager number [phone]   I have taken an interval history, reviewed the chart and examined the patient. I agree with the extender's note, impression and recommendations. GI bleeding has slowed or stopped at this point. FFP and Vit K ordered today for elevated INR. Need INR < 2.0. EGD rescheduled for tomorrow for suspected UGI source.  Venita Lick. Russella Dar MD [facility]

## 2012-08-31 NOTE — Care Management Note (Unsigned)
    Page 1 of 2   09/07/2012     1:25:19 PM   CARE MANAGEMENT NOTE 09/07/2012  Patient:  [name], [name]   Account Number:  [id number]  Date Initiated:  08/31/2012  Documentation initiated by:  AMERSON,JULIE  Subjective/Objective Assessment:   PT ADM WITH ACUTE UPPER GI BLEED, HYPOTENSIVE SHOCK ON 08/30/12.  PTA, PT INDEPENDENT, LIVES ALONE.     Action/Plan:   MET WITH PT AND DAUGHTERS TO DISCUSS DC PLANS.  PT TO DC TO DAUGHTER'S HOME UNTIL RECOVERED.  WILL FOLLOW FOR HOME NEEDS AS PT PROGRESSES.   Anticipated DC Date:  09/04/2012   Anticipated DC Plan:  HOME W HOME HEALTH SERVICES      DC Planning Services  CM consult      [facility] Choice  HOME HEALTH  DURABLE MEDICAL EQUIPMENT   Choice offered to / List presented to:  C-1 Patient   DME arranged  [facility]st      DME agency  [facility]     [facility] arranged  HH-1 RN  HH-2 PT  HH-4 NURSE'S AIDE      HH agency  [facility]   Status of service:  In process, will continue to follow Medicare Important Message given?   (If response is "NO", the following Medicare IM given date fields will be blank) Date Medicare IM given:   Date Additional Medicare IM given:    Discharge Disposition:    Per UR Regulation:  Reviewed for med. necessity/level of care/duration of stay  If discussed at Long Length of Stay Meetings, dates discussed:   09/07/2012    Comments:  09/07/12  1322  Jaylen Knope SIMMONS RN, BSN [phone] REFERRAL PLACED TO [facility] WITH [facility] FOR HHRN/PT/AIDE PER CHOICE.  SOC DATE: WITHIN 24-48HRS POST D/C; REFERRAL PLACED TO [facility] WITH [facility] FOR RW TO BE DELIVERED TO ROOM FOR D/C;  NCM WILL FOLLOW. PT D/C TO DAUGHTER'S HOME- [name] [address] [place] [phone] [name] [phone] MAR[place] [phone] [place]EE

## 2012-09-01 ENCOUNTER — Encounter (HOSPITAL_COMMUNITY)
Admission: EM | Disposition: A | Discharge status: Home / Self Care | Payer: Self-pay | Source: Home / Self Care | Attending: Cardiology

## 2012-09-01 ENCOUNTER — Encounter (HOSPITAL_COMMUNITY): Payer: Self-pay

## 2012-09-01 HISTORY — PX: ESOPHAGOGASTRODUODENOSCOPY: SHX5428

## 2012-09-01 LAB — PREPARE FRESH FROZEN PLASMA
Unit division: 0
Unit division: 0
Unit division: 0

## 2012-09-01 LAB — CBC
HCT: 23.2 % — ABNORMAL LOW (ref 36.0–46.0)
Hemoglobin: 7.7 g/dL — ABNORMAL LOW (ref 12.0–15.0)
MCV: 91.7 fL (ref 78.0–100.0)
Platelets: 83 10*3/uL — ABNORMAL LOW (ref 150–400)
RBC: 2.53 MIL/uL — ABNORMAL LOW (ref 3.87–5.11)
WBC: 11.9 10*3/uL — ABNORMAL HIGH (ref 4.0–10.5)

## 2012-09-01 LAB — GLUCOSE, CAPILLARY
Glucose-Capillary: 93 mg/dL (ref 70–99)
Glucose-Capillary: 114 mg/dL — ABNORMAL HIGH (ref 70–99)
Glucose-Capillary: 145 mg/dL — ABNORMAL HIGH (ref 70–99)

## 2012-09-01 LAB — HEMOGLOBIN AND HEMATOCRIT, BLOOD
HCT: 24.7 % — ABNORMAL LOW (ref 36.0–46.0)
Hemoglobin: 8.3 g/dL — ABNORMAL LOW (ref 12.0–15.0)

## 2012-09-01 LAB — BASIC METABOLIC PANEL
CO2: 29 mEq/L (ref 19–32)
Chloride: 108 mEq/L (ref 96–112)
Potassium: 3.9 mEq/L (ref 3.5–5.1)
Sodium: 142 mEq/L (ref 135–145)

## 2012-09-01 LAB — PREPARE RBC (CROSSMATCH)

## 2012-09-01 SURGERY — EGD (ESOPHAGOGASTRODUODENOSCOPY)
Anesthesia: Moderate Sedation

## 2012-09-01 MED ORDER — MIDAZOLAM HCL 10 MG/2ML IJ SOLN
INTRAMUSCULAR | Status: DC | PRN
  Administered 2012-09-01 (×3): 2.5 mg via INTRAVENOUS

## 2012-09-01 MED ORDER — MIDAZOLAM HCL 5 MG/ML IJ SOLN
INTRAMUSCULAR | Status: AC
  Filled 2012-09-01: qty 2

## 2012-09-01 MED ORDER — EPINEPHRINE HCL 0.1 MG/ML IJ SOLN
INTRAMUSCULAR | Status: AC
  Filled 2012-09-01: qty 10

## 2012-09-01 MED ORDER — GLUCAGON HCL (RDNA) 1 MG IJ SOLR
INTRAMUSCULAR | Status: DC | PRN
  Administered 2012-09-01 (×2): .25 mg via INTRAVENOUS

## 2012-09-01 MED ORDER — FENTANYL CITRATE 0.05 MG/ML IJ SOLN
INTRAMUSCULAR | Status: DC | PRN
  Administered 2012-09-01 (×3): 25 ug via INTRAVENOUS

## 2012-09-01 MED ORDER — BUTAMBEN-TETRACAINE-BENZOCAINE 2-2-14 % EX AERO
INHALATION_SPRAY | CUTANEOUS | Status: DC | PRN
  Administered 2012-09-01: 2 via TOPICAL

## 2012-09-01 MED ORDER — SODIUM CHLORIDE 0.9 % IV SOLN: INTRAVENOUS | Status: DC

## 2012-09-01 MED ORDER — SODIUM CHLORIDE 0.9 % IJ SOLN
INTRAMUSCULAR | Status: DC | PRN
  Administered 2012-09-01: 13:00:00

## 2012-09-01 MED ORDER — FENTANYL CITRATE 0.05 MG/ML IJ SOLN
INTRAMUSCULAR | Status: AC
  Filled 2012-09-01: qty 2

## 2012-09-01 MED ORDER — GLUCAGON HCL (RDNA) 1 MG IJ SOLR
INTRAMUSCULAR | Status: AC
  Filled 2012-09-01: qty 1

## 2012-09-01 NOTE — Progress Notes (Signed)
[facility] Progress Note  Subjective:  Feels fine.  No BM since admission.  Says that she is tired of lying in the bed.  Objective:  Vital signs in last 24 hours: Temp:  [97.9 F (36.6 C)-99.2 F (37.3 C)] 98.7 F (37.1 C) (09/25 0815) Pulse Rate:  [67-132] 73  (09/25 0815) Resp:  [14-29] 29  (09/25 0815) BP: (92-144)/(33-96) 144/53 mmHg (09/25 0815) SpO2:  [87 %-99 %] 91 % (09/25 0800) Weight:  [242 lb 4.6 oz (109.9 kg)] 242 lb 4.6 oz (109.9 kg) (09/25 0400) Last BM Date: 08/30/12 General:   Alert, Well-developed, in NAD Heart:  Irregularly irregular; no murmurs Pulm:  CTAB.  No W/R/R. Abdomen:  Soft, nontender and nondistended. Normal bowel sounds, without guarding, and without rebound.   Extremities:  Pitting edema in B/L LE's. Neurologic:  Alert and  oriented x4;  grossly normal neurologically. Psych:  Alert and cooperative. Normal mood and affect.  Intake/Output from previous day: 09/24 0701 - 09/25 0700 In: 4756 [I.V.:2505; Blood:2239; IV Piggyback:12] Out: 1701 [Urine:1701] Intake/Output this shift: Total I/O In: 862.5 [I.V.:175; Blood:687.5] Out: -   Lab Results:  Basename 09/01/12 0408 08/31/12 2000 08/31/12 0800 08/31/12 0100 08/30/12 1946  WBC 11.9* -- -- 14.0* 14.6*  HGB 7.7* 6.8* 8.4* -- --  HCT 23.2* 20.7* 25.3* -- --  PLT 83* -- -- 116* 120*   BMET  Basename 09/01/12 0408 08/31/12 0502 08/30/12 1946  NA 142 143 141  K 3.9 4.7 5.1  CL 108 110 110  CO2 29 27 27   GLUCOSE 107* 109* 146*  BUN 36* 41* 39*  CREATININE 1.10 1.19* 1.21*  CALCIUM 8.0* 8.3* 8.7   PT/INR  Basename 09/01/12 0408 08/31/12 2000  LABPROT 23.2* 28.5*  INR 2.16* 2.86*    Assessment / Plan: -GIB: Likely upper source with black stools and NSAID use.  No sign of active bleeding since admission. -ABL anemia: Due to the above. Hgb down to 6.8 grams again O/N; was transfused and going to receive another unit of PRBC's this AM. -Supra-therapeutic INR:  Being  reversed.  INR 2.16 this AM, but medicine already ordered 2 more units of FFP. -History of DVT and need for coumadin.  -Hyperkalemia: Has been corrected.  -NSAID use -Thrombocytopenia:  Platelets trending down since admission.  ? Source.  *EGD later today. *Continue to monitor Hgb. *Continue BID IV PPI.    LOS: 2 days   ZEHR, JESSICA D.  09/01/2012, 8:49 AM  Pager number [phone]   I have taken an interval history, reviewed the chart and examined the patient. I agree with the extender's note, impression and recommendations. For EGD today. INR now 2.16 and to receive additional FFP. Has not showed evidence of continued bleeding since admission. Hb now 7.7.  Venita Lick. Russella Dar MD [facility]

## 2012-09-01 NOTE — Progress Notes (Signed)
Subjective:  Patient denies any chest pain or shortness of breath. Denies any abdominal pain. Patient underwent EGD and control of bleeding by Endo clipped today. Patient tolerated procedure well. Remains hemodynamically stable now.  Objective:  Vital Signs in the last 24 hours: Temp:  [98.1 F (36.7 C)-99.2 F (37.3 C)] 98.4 F (36.9 C) (09/25 1645) Pulse Rate:  [67-105] 78  (09/25 1800) Resp:  [4-53] 20  (09/25 1800) BP: (99-172)/(32-125) 113/47 mmHg (09/25 1800) SpO2:  [87 %-100 %] 100 % (09/25 1800) Weight:  [109.9 kg (242 lb 4.6 oz)] 109.9 kg (242 lb 4.6 oz) (09/25 0400)  Intake/Output from previous day: 09/24 0701 - 09/25 0700 In: 4756 [I.V.:2505; Blood:2239; IV Piggyback:12] Out: 1701 [Urine:1701] Intake/Output from this shift: Total I/O In: 2525 [P.O.:350; I.V.:850; Blood:1325] Out: 350 [Urine:350]  Physical Exam: Neck: no adenopathy, no carotid bruit, no JVD and supple, symmetrical, trachea midline Lungs: clear to auscultation bilaterally Heart: irregularly irregular rhythm, S1, S2 normal and Soft systolic murmur noted no S3 gallop Abdomen: soft, non-tender; bowel sounds normal; no masses,  no organomegaly Extremities: extremities normal, atraumatic, no cyanosis or edema  Lab Results:  Basename 09/01/12 0925 09/01/12 0408 08/31/12 0100  WBC -- 11.9* 14.0*  HGB 8.3* 7.7* --  PLT -- 83* 116*    Basename 09/01/12 0408 08/31/12 0502  NA 142 143  K 3.9 4.7  CL 108 110  CO2 29 27  GLUCOSE 107* 109*  BUN 36* 41*  CREATININE 1.10 1.19*   No results found for this basename: TROPONINI:2,CK,MB:2 in the last 72 hours Hepatic Function Panel  Basename 08/30/12 1946  PROT 5.4*  ALBUMIN 2.6*  AST 19  ALT 11  ALKPHOS 52  BILITOT 0.5  BILIDIR --  IBILI --   No results found for this basename: CHOL in the last 72 hours No results found for this basename: PROTIME in the last 72 hours  Imaging: Imaging results have been reviewed and No results found.  Cardiac  Studies:  Assessment/Plan:  Status post Acute upper GI bleeding from submucosal nodule in second portion of the duodenum status post EGD/Endo Clip to control bleeding rule out gastrointestinal stromal tumor Status post Coumadin toxicity  Status post Hypotensive shock secondary to #1  Hypertension  Non-insulin-dependent diabetes mellitus  Chronic atrial fibrillation  History of DVT  Degenerative joint disease  History of CA of breast  Acute anemia secondary to #1  Plan Continue present management Follow serial H&H Further workup as per GI  LOS: 2 days    [name] N 09/01/2012, 6:22 PM

## 2012-09-01 NOTE — Progress Notes (Signed)
IN TO ASSESS PT FOR PIV, PT IS LEFT ARM ONLY,  NO SITE FOR PIV NOTED, SUGGEST PICC PLACEMENT.

## 2012-09-01 NOTE — Progress Notes (Signed)
Nursing Note  Dr Sharyn Lull paged with AM lab results.  Hgb 7.7 and INR 2.16. Orders to transfuse 1 unit PRBC and 2 FFP.  Vitals stable, patient without complaints.  Will continue to monitor.  L Alyra [name] RN

## 2012-09-01 NOTE — Op Note (Signed)
[facility] [address] [place], [address]   ENDOSCOPY PROCEDURE REPORT  PATIENT: Diane, Dominguez  MR#: [id number] BIRTHDATE: [birth year] , 85  yrs. old GENDER: Female ENDOSCOPIST: Meryl Dare, MD, [facility] REFERRED BY:  Rinaldo Cloud, M.D. PROCEDURE DATE:  09/01/2012 PROCEDURE:  EGD w/ control of bleeding ASA CLASS:     Class III INDICATIONS:  melena,  acute post hemorrhagic anemia. MEDICATIONS: These medications were titrated to patient response per physician's verbal order, Fentanyl 75 mcg IV, Versed 7 mg IV, and Glucagon 0.5 mg IV TOPICAL ANESTHETIC: Cetacaine Spray DESCRIPTION OF PROCEDURE: After the risks benefits and alternatives of the procedure were thoroughly explained, informed consent was obtained.  The   Pentax upper endoscope and duodenoscope were introduced through the mouth and advanced to the third portion of the duodenum. Without limitations.  The instrument was slowly withdrawn as the mucosa was fully examined.   DUODENUM: A smooth submucosal nodule with an adherent clot and possible vessel with active oozing in the 2nd part of the duodenum just proximal to the major ampulla. An ulcer was not noted but may have been covered by the adherent clot. 5 ml 1:10,000 Epinephrine solution injection was given around this clot/vessel to control bleeding. An Endoclip was placed across the adherent clot/vessel. Hemostasis was achieved. The duodenal mucosa showed no other abnormalities in the duodenal bulb, 2nd duodenum and ampulla. STOMACH: The mucosa of the stomach appeared normal. ESOPHAGUS: The mucosa of the esophagus appeared normal except for a small abrasion at the EGJ.  Retroflexed views revealed no abnormalities.  The scope was then withdrawn from the patient and the procedure completed.  COMPLICATIONS: There were no complications. ENDOSCOPIC IMPRESSION: 1.   Submucosal nodule in the 2nd duodenum with adherent clot/vessel; Epi  injection, Endoclip applied to control bleeding 2.   Major ampulla appeared normal and separate from the nodule  RECOMMENDATIONS: 1.  CT Scan and consider EUS to further evaluate the nodule-possible GIST 2.  Consider angiogram for recurrent bleeding    eSigned:  Meryl Dare, MD, [facility] 09/01/2012 1:00 PM

## 2012-09-01 NOTE — Interval H&P Note (Signed)
History and Physical Interval Note:  09/01/2012 11:44 AM  [name]  has presented today for surgery, with the diagnosis of Melena; acute drop in Hgb  The various methods of treatment have been discussed with the patient and family. After consideration of risks, benefits and other options for treatment, the patient has consented to  Procedure(s) (LRB) with comments: ESOPHAGOGASTRODUODENOSCOPY (EGD) (N/A) as a surgical intervention .  The patient's history has been reviewed, patient examined, no change in status, stable for surgery.  I have reviewed the patient's chart and labs.  Questions were answered to the patient's satisfaction.     Venita Lick. Russella Dar MD [facility]

## 2012-09-02 ENCOUNTER — Encounter (HOSPITAL_COMMUNITY): Payer: Self-pay

## 2012-09-02 ENCOUNTER — Encounter (HOSPITAL_COMMUNITY): Payer: Self-pay | Admitting: Gastroenterology

## 2012-09-02 ENCOUNTER — Inpatient Hospital Stay (HOSPITAL_COMMUNITY): Payer: Medicare Other

## 2012-09-02 DIAGNOSIS — K8050 Calculus of bile duct without cholangitis or cholecystitis without obstruction: Secondary | ICD-10-CM | POA: Diagnosis present

## 2012-09-02 DIAGNOSIS — K8020 Calculus of gallbladder without cholecystitis without obstruction: Secondary | ICD-10-CM

## 2012-09-02 LAB — HEMOGLOBIN AND HEMATOCRIT, BLOOD: Hemoglobin: 6.7 g/dL — CL (ref 12.0–15.0)

## 2012-09-02 LAB — GLUCOSE, CAPILLARY
Glucose-Capillary: 143 mg/dL — ABNORMAL HIGH (ref 70–99)
Glucose-Capillary: 110 mg/dL — ABNORMAL HIGH (ref 70–99)
Glucose-Capillary: 146 mg/dL — ABNORMAL HIGH (ref 70–99)

## 2012-09-02 LAB — PREPARE FRESH FROZEN PLASMA: Unit division: 0

## 2012-09-02 LAB — CBC
HCT: 24.1 % — ABNORMAL LOW (ref 36.0–46.0)
Hemoglobin: 7.9 g/dL — ABNORMAL LOW (ref 12.0–15.0)
MCHC: 32.8 g/dL (ref 30.0–36.0)
MCV: 93.8 fL (ref 78.0–100.0)
RDW: 18.1 % — ABNORMAL HIGH (ref 11.5–15.5)
WBC: 11 10*3/uL — ABNORMAL HIGH (ref 4.0–10.5)

## 2012-09-02 LAB — BASIC METABOLIC PANEL
CO2: 29 mEq/L (ref 19–32)
Calcium: 7.9 mg/dL — ABNORMAL LOW (ref 8.4–10.5)
Creatinine, Ser: 0.91 mg/dL (ref 0.50–1.10)
GFR calc Af Amer: 66 mL/min — ABNORMAL LOW (ref >90)
GFR calc non Af Amer: 57 mL/min — ABNORMAL LOW (ref >90)
Sodium: 142 mEq/L (ref 135–145)

## 2012-09-02 LAB — PREPARE RBC (CROSSMATCH)

## 2012-09-02 MED ORDER — FUROSEMIDE 10 MG/ML IJ SOLN
20.0000 mg | Freq: Once | INTRAMUSCULAR | Status: AC
  Administered 2012-09-02: 20 mg via INTRAVENOUS

## 2012-09-02 MED ORDER — FUROSEMIDE 10 MG/ML IJ SOLN
INTRAMUSCULAR | Status: AC
  Administered 2012-09-02: 20 mg via INTRAVENOUS
  Filled 2012-09-02: qty 4

## 2012-09-02 MED ORDER — VITAMIN K1 10 MG/ML IJ SOLN
1.0000 mg | Freq: Once | INTRAVENOUS | Status: AC
  Administered 2012-09-02: 1 mg via INTRAVENOUS
  Filled 2012-09-02 (×2): qty 0.1

## 2012-09-02 MED ORDER — SODIUM CHLORIDE 0.9 % IJ SOLN
10.0000 mL | Freq: Two times a day (BID) | INTRAMUSCULAR | Status: DC
  Administered 2012-09-03 – 2012-09-05 (×5): 10 mL

## 2012-09-02 MED ORDER — IOHEXOL 300 MG/ML  SOLN
20.0000 mL | INTRAMUSCULAR | Status: AC
  Administered 2012-09-02 (×2): 20 mL via ORAL

## 2012-09-02 MED ORDER — SODIUM CHLORIDE 0.9 % IJ SOLN: 10.0000 mL | INTRAMUSCULAR | Status: DC | PRN

## 2012-09-02 MED ORDER — MAGNESIUM HYDROXIDE 400 MG/5ML PO SUSP: 30.0000 mL | Freq: Every day | ORAL | Status: DC | PRN

## 2012-09-02 MED ORDER — IOHEXOL 300 MG/ML  SOLN
100.0000 mL | Freq: Once | INTRAMUSCULAR | Status: AC | PRN
  Administered 2012-09-02: 100 mL via INTRAVENOUS

## 2012-09-02 MED ORDER — DIGOXIN 0.25 MG/ML IJ SOLN
0.1250 mg | Freq: Every day | INTRAMUSCULAR | Status: DC
  Administered 2012-09-02 – 2012-09-06 (×5): 0.125 mg via INTRAVENOUS
  Filled 2012-09-02 (×6): qty 0.5

## 2012-09-02 NOTE — Progress Notes (Signed)
Peripherally Inserted Central Catheter/Midline Placement  The IV Nurse has discussed with the patient and/or persons authorized to consent for the patient, the purpose of this procedure and the potential benefits and risks involved with this procedure.  The benefits include less needle sticks, lab draws from the catheter and patient may be discharged home with the catheter.  Risks include, but not limited to, infection, bleeding, blood clot (thrombus formation), and puncture of an artery; nerve damage and irregular heat beat.  Alternatives to this procedure were also discussed. Daughter present - questions answered.  PICC/Midline Placement Documentation        Mellissa Kohut 09/02/2012, 2:59 PM

## 2012-09-02 NOTE — Progress Notes (Signed)
Dr. Sharyn Lull notified of critical Hgb 6.7. Also made aware of acute mental status change. She is intermittently confused as to the year, month, where she is. It takes prompting her multiple times for her to answer correctly. She states that she feels "like I am floating." Checked CBG. Result was 146. Orders received for lasix and 2 units PRBC. Thresa Ross RN

## 2012-09-02 NOTE — Progress Notes (Signed)
Subjective:  Patient denies any chest pain shortness of breath or abdominal pain. Eager to go home. Hemoglobin continues to decrease received one unit of packed RBC last night. INR remains elevated will give one dose of IV vitamin K.  Objective:  Vital Signs in the last 24 hours: Temp:  [98.4 F (36.9 C)-99.8 F (37.7 C)] 98.6 F (37 C) (09/26 1114) Pulse Rate:  [74-100] 87  (09/26 0900) Resp:  [4-53] 17  (09/26 0900) BP: (104-172)/(32-94) 143/54 mmHg (09/26 0900) SpO2:  [87 %-100 %] 100 % (09/26 0900) Weight:  [111.1 kg (244 lb 14.9 oz)] 111.1 kg (244 lb 14.9 oz) (09/26 0600)  Intake/Output from previous day: 09/25 0701 - 09/26 0700 In: 4037.1 [P.O.:590; I.V.:1762.1; Blood:1675; IV Piggyback:10] Out: 1750 [Urine:1750] Intake/Output from this shift: Total I/O In: 100 [I.V.:100] Out: -   Physical Exam: Neck: no adenopathy, no carotid bruit, no JVD and supple, symmetrical, trachea midline Lungs: Decrease stress on that bases Heart: irregularly irregular rhythm, S1, S2 normal and Soft systolic murmur noted Abdomen: soft, non-tender; bowel sounds normal; no masses,  no organomegaly Extremities: extremities normal, atraumatic, no cyanosis or edema  Lab Results:  Basename 09/02/12 0400 09/01/12 2000 09/01/12 0408  WBC 11.0* -- 11.9*  HGB 7.9* 7.4* --  PLT 73* -- 83*    Basename 09/02/12 0400 09/01/12 0408  NA 142 142  K 4.3 3.9  CL 109 108  CO2 29 29  GLUCOSE 171* 107*  BUN 32* 36*  CREATININE 0.91 1.10   No results found for this basename: TROPONINI:2,CK,MB:2 in the last 72 hours Hepatic Function Panel  Basename 08/30/12 1946  PROT 5.4*  ALBUMIN 2.6*  AST 19  ALT 11  ALKPHOS 52  BILITOT 0.5  BILIDIR --  IBILI --   No results found for this basename: CHOL in the last 72 hours No results found for this basename: PROTIME in the last 72 hours  Imaging: Imaging results have been reviewed and No results found.  Cardiac Studies:  Assessment/Plan:  Status post  Acute upper GI bleeding from submucosal nodule in second portion of the duodenum status post EGD/Endo Clip to control bleeding rule out gastrointestinal stromal tumor  Status post Coumadin toxicity  Status post Hypotensive shock secondary to #1  Hypertension  Non-insulin-dependent diabetes mellitus  Chronic atrial fibrillation  History of DVT  Degenerative joint disease  History of CA of breast  Acute anemia secondary to #1  Thrombocytopenia etiology unclear Plan Schedule for CT of abdomen and pelvis today PICC  Line DC right femoral and jugular lines once PICC line in place Vitamin K as per orders Out of bed to chair. Restart digoxin for orders  LOS: 3 days    [name] 09/02/2012, 11:25 AM

## 2012-09-02 NOTE — Progress Notes (Signed)
CRITICAL VALUE ALERT  Critical value received:  Hgb 6.7  Date of notification:  09/02/12  Time of notification:  2005  Critical value read back: yes  Nurse who received alert:  A. Lamar Benes RN  MD notified (1st page):  Dr. Sharyn Lull   Time of first page:  2008  MD notified (2nd page):   Time of second page:   Responding MD:  Dr. Sharyn Lull   Time MD responded: 2012

## 2012-09-02 NOTE — Progress Notes (Addendum)
[facility] Progress Note  Subjective:  Still not putting out any stool; no melena since admission, but Hgb continues to decrease.  No abdominal pain.  Says that she is hungry.  Objective:  Vital signs in last 24 hours: Temp:  [98.4 F (36.9 C)-99.8 F (37.7 C)] 99.3 F (37.4 C) (09/26 0727) Pulse Rate:  [70-100] 84  (09/26 0800) Resp:  [4-53] 16  (09/26 0800) BP: (104-172)/(32-125) 132/52 mmHg (09/26 0800) SpO2:  [87 %-100 %] 100 % (09/26 0800) Weight:  [244 lb 14.9 oz (111.1 kg)] 244 lb 14.9 oz (111.1 kg) (09/26 0600) Last BM Date: 08/30/12 General:   Alert, Well-developed, in NAD Heart:  Irregularly irregular rate and rhythm; no murmurs Pulm:  CTAB.  No W/R/R. Abdomen:  Soft, nontender and nondistended. Normal bowel sounds, without guarding, and without rebound.   Neurologic:  Alert and  oriented x4;  grossly normal neurologically.  Intake/Output from previous day: 09/25 0701 - 09/26 0700 In: 4037.1 [P.O.:590; I.V.:1762.1; WUJWJ:1914; IV Piggyback:10] Out: 1750 [Urine:1750] Intake/Output this shift: Total I/O In: 50 [I.V.:50] Out: -   Lab Results:  Basename 09/02/12 0400 09/01/12 2000 09/01/12 0925 09/01/12 0408 08/31/12 0100  WBC 11.0* -- -- 11.9* 14.0*  HGB 7.9* 7.4* 8.3* -- --  HCT 24.1* 22.2* 24.7* -- --  PLT 73* -- -- 83* 116*   BMET  Basename 09/02/12 0400 09/01/12 0408 08/31/12 0502  NA 142 142 143  K 4.3 3.9 4.7  CL 109 108 110  CO2 29 29 27   GLUCOSE 171* 107* 109*  BUN 32* 36* 41*  CREATININE 0.91 1.10 1.19*  CALCIUM 7.9* 8.0* 8.3*   PT/INR  Basename 09/02/12 0400 09/01/12 0408  LABPROT 25.4* 23.2*  INR 2.44* 2.16*   Assessment / Plan: -Submucosal nodule with adherent clot or vessel in duodenum:  Seen on EGD 9/25.  Injected with epi and clipped.  ? GIST. -GIB: Secondary to the above. No sign of active bleeding since admission.  -ABL anemia: Due to the above. Hgb again this AM; was given another unit of PRBC's.  Once again, no sign  of active bleeding.  -History of DVT and need for coumadin.  -Thrombocytopenia: Platelets trending down since admission. ? Source.   *Will check CT abdomen and pelvis today to evaluated this nodule that was seen. ?GIST. *Monitor Hgb and transfuse prn. *If clinically re-bleeds, then consider angiogram. *Continue IV PPI BID.   LOS: 3 days   ZEHR, JESSICA D.  09/02/2012, 8:49 AM  Pager number [phone]   I have taken an interval history, reviewed the chart and examined the patient. I agree with the extender's note, impression and recommendations. No apparent re-bleeding. Hb=7.9 this morning. INR is still elevated, now at 2.44. Ideally would like to have the INR < 2.0 for at least 5-7 days. CT scan showed cholelithiasis and choledocholithiasis but no duodenal lesion. Probably will need an EUS electively as an outpatient to further evaluate the duodenal nodule and ERCP to removed the CBD stone. Platelets have decreased to 73K.  Venita Lick. Russella Dar MD [facility]

## 2012-09-02 NOTE — Progress Notes (Signed)
Nursing Note  Noted AM lab results, Hgb 7.9 after one PRBC overnight.  HR trending up Afib  90-100's, BP stable 130/50. No vomiting, no melana, patient without complaints.  Dr Sharyn Lull paged and updated, no new orders.  Requested to page on call MD for GI.  Spoke with Dr. Rhea Belton and updated on patient. GI will see on early AM rounds. Will continue to monitor closely and follow Hgb, next H/H due at 0800am.   L Shaundrea Carrigg RN

## 2012-09-03 ENCOUNTER — Other Ambulatory Visit: Payer: Self-pay | Admitting: Hematology and Oncology

## 2012-09-03 ENCOUNTER — Encounter (HOSPITAL_COMMUNITY): Payer: Self-pay | Admitting: *Deleted

## 2012-09-03 ENCOUNTER — Encounter (HOSPITAL_COMMUNITY)
Admission: EM | Disposition: A | Discharge status: Home / Self Care | Payer: Self-pay | Source: Home / Self Care | Attending: Cardiology

## 2012-09-03 DIAGNOSIS — Z7901 Long term (current) use of anticoagulants: Secondary | ICD-10-CM

## 2012-09-03 DIAGNOSIS — K264 Chronic or unspecified duodenal ulcer with hemorrhage: Secondary | ICD-10-CM | POA: Diagnosis present

## 2012-09-03 DIAGNOSIS — Z86718 Personal history of other venous thrombosis and embolism: Secondary | ICD-10-CM

## 2012-09-03 DIAGNOSIS — T50904A Poisoning by unspecified drugs, medicaments and biological substances, undetermined, initial encounter: Secondary | ICD-10-CM

## 2012-09-03 DIAGNOSIS — T50901A Poisoning by unspecified drugs, medicaments and biological substances, accidental (unintentional), initial encounter: Secondary | ICD-10-CM

## 2012-09-03 DIAGNOSIS — D696 Thrombocytopenia, unspecified: Secondary | ICD-10-CM

## 2012-09-03 HISTORY — PX: ESOPHAGOGASTRODUODENOSCOPY: SHX5428

## 2012-09-03 LAB — DIC (DISSEMINATED INTRAVASCULAR COAGULATION)PANEL
D-Dimer, Quant: 3.63 ug/mL-FEU — ABNORMAL HIGH (ref 0.00–0.48)
INR: 1.5 — ABNORMAL HIGH (ref 0.00–1.49)
Platelets: 51 10*3/uL — ABNORMAL LOW (ref 150–400)

## 2012-09-03 LAB — TYPE AND SCREEN
ABO/RH(D): O POS
Antibody Screen: NEGATIVE
Unit division: 0
Unit division: 0
Unit division: 0

## 2012-09-03 LAB — CBC WITH DIFFERENTIAL/PLATELET
Eosinophils Absolute: 0.1 10*3/uL (ref 0.0–0.7)
Hemoglobin: 7.1 g/dL — ABNORMAL LOW (ref 12.0–15.0)
Lymphs Abs: 2.1 10*3/uL (ref 0.7–4.0)
MCH: 30.1 pg (ref 26.0–34.0)
MCV: 91.5 fL (ref 78.0–100.0)
Monocytes Relative: 7 % (ref 3–12)
Neutrophils Relative %: 73 % (ref 43–77)
RBC: 2.36 MIL/uL — ABNORMAL LOW (ref 3.87–5.11)

## 2012-09-03 LAB — RETICULOCYTES: Retic Ct Pct: 9.8 % — ABNORMAL HIGH (ref 0.4–3.1)

## 2012-09-03 LAB — CBC
Platelets: 63 10*3/uL — ABNORMAL LOW (ref 150–400)
RBC: 2.75 MIL/uL — ABNORMAL LOW (ref 3.87–5.11)
RDW: 18.8 % — ABNORMAL HIGH (ref 11.5–15.5)
WBC: 12.3 10*3/uL — ABNORMAL HIGH (ref 4.0–10.5)

## 2012-09-03 LAB — GLUCOSE, CAPILLARY
Glucose-Capillary: 155 mg/dL — ABNORMAL HIGH (ref 70–99)
Glucose-Capillary: 127 mg/dL — ABNORMAL HIGH (ref 70–99)

## 2012-09-03 LAB — BASIC METABOLIC PANEL
CO2: 30 mEq/L (ref 19–32)
CO2: 31 mEq/L (ref 19–32)
Chloride: 106 mEq/L (ref 96–112)
GFR calc Af Amer: 69 mL/min — ABNORMAL LOW (ref >90)
Glucose, Bld: 168 mg/dL — ABNORMAL HIGH (ref 70–99)
Potassium: 3.8 mEq/L (ref 3.5–5.1)
Potassium: 3.8 mEq/L (ref 3.5–5.1)
Sodium: 139 mEq/L (ref 135–145)
Sodium: 139 mEq/L (ref 135–145)

## 2012-09-03 SURGERY — EGD (ESOPHAGOGASTRODUODENOSCOPY)
Anesthesia: Moderate Sedation

## 2012-09-03 MED ORDER — FENTANYL CITRATE 0.05 MG/ML IJ SOLN
INTRAMUSCULAR | Status: DC | PRN
  Administered 2012-09-03 (×2): 25 ug via INTRAVENOUS

## 2012-09-03 MED ORDER — MIDAZOLAM HCL 5 MG/5ML IJ SOLN
INTRAMUSCULAR | Status: DC | PRN
  Administered 2012-09-03: 1 mg via INTRAVENOUS
  Administered 2012-09-03: 2 mg via INTRAVENOUS

## 2012-09-03 MED ORDER — BISACODYL 10 MG RE SUPP
10.0000 mg | Freq: Once | RECTAL | Status: AC
  Administered 2012-09-03: 10 mg via RECTAL
  Filled 2012-09-03: qty 1

## 2012-09-03 MED ORDER — SODIUM CHLORIDE 0.9 % IV SOLN: INTRAVENOUS | Status: DC

## 2012-09-03 MED ORDER — BUTAMBEN-TETRACAINE-BENZOCAINE 2-2-14 % EX AERO
INHALATION_SPRAY | CUTANEOUS | Status: DC | PRN
  Administered 2012-09-03: 2 via TOPICAL

## 2012-09-03 MED ORDER — MIDAZOLAM HCL 5 MG/ML IJ SOLN
INTRAMUSCULAR | Status: AC
  Filled 2012-09-03: qty 2

## 2012-09-03 MED ORDER — EPINEPHRINE HCL 0.1 MG/ML IJ SOLN
INTRAMUSCULAR | Status: AC
  Filled 2012-09-03: qty 10

## 2012-09-03 MED ORDER — GLYCOPYRROLATE 0.2 MG/ML IJ SOLN
INTRAMUSCULAR | Status: DC | PRN
  Administered 2012-09-03: 0.2 mg via INTRAVENOUS

## 2012-09-03 MED ORDER — FENTANYL CITRATE 0.05 MG/ML IJ SOLN
INTRAMUSCULAR | Status: AC
  Filled 2012-09-03: qty 2

## 2012-09-03 MED ORDER — FUROSEMIDE 10 MG/ML IJ SOLN
40.0000 mg | Freq: Once | INTRAMUSCULAR | Status: AC
  Administered 2012-09-03: 40 mg via INTRAVENOUS

## 2012-09-03 MED ORDER — SODIUM CHLORIDE 0.9 % IJ SOLN
INTRAMUSCULAR | Status: DC | PRN
  Administered 2012-09-03: 18:00:00

## 2012-09-03 NOTE — Progress Notes (Signed)
[name] Progress Note  Subjective: *Patient continues to drop her hemoglobin despite receiving transfusions. She's having melenic stools.**Today's INR is 1.50.    Assessment / Plan: *Persistent GI bleeding, likely secondary to lesion in the duodenum. Plan repeat endoscopy.**  Active Problems:  Hemorrhage of gastrointestinal tract, unspecified  Warfarin-induced coagulopathy  Acute posthemorrhagic anemia  Calculus of bile duct without mention of cholecystitis or obstruction  Calculus of gallbladder without mention of cholecystitis or obstruction  Thrombocytopenia     LOS: 4 days   [name]  09/03/2012, 5:26 PM

## 2012-09-03 NOTE — Progress Notes (Signed)
Subjective:  Patient denies any chest pain shortness of breath abdominal pain states feels hungry. Received 2 units of packed RBCs yesterday still hemoglobin not appropriately elevated suggestive of probably chronic slow bleed. Platelet count gradually decreasing. Will get hematology consult. May need repeat EGD as BUN remains elevated despite hydration and multiple packed RBCs transfusion.  Objective:  Vital Signs in the last 24 hours: Temp:  [98.2 F (36.8 C)-100.3 F (37.9 C)] 98.9 F (37.2 C) (09/27 0742) Pulse Rate:  [81-110] 98  (09/27 0900) Resp:  [12-26] 24  (09/27 0900) BP: (88-155)/(37-81) 101/41 mmHg (09/27 0900) SpO2:  [92 %-100 %] 100 % (09/27 0900) Weight:  [111.2 kg (245 lb 2.4 oz)] 111.2 kg (245 lb 2.4 oz) (09/27 0615)  Intake/Output from previous day: 09/26 0701 - 09/27 0700 In: 3118.3 [P.O.:1550; I.V.:950; Blood:556.3; IV Piggyback:62] Out: 2725 [Urine:2725] Intake/Output from this shift: Total I/O In: 704.2 [P.O.:500; I.V.:204.2] Out: 300 [Urine:300]  Physical Exam: Neck: no adenopathy, no carotid bruit, no JVD and supple, symmetrical, trachea midline Lungs: Decreased breath sound at bases Heart: irregularly irregular rhythm, S1, S2 normal and  soft systtolic murmur nottedd Abdomen: soft, non-tender; bowel sounds normal; no masses,  no organomegaly Extremities: extremities normal, atraumatic, no cyanosis or edema  Lab Results:  Basename 09/03/12 0415 09/02/12 1945 09/02/12 0400  WBC 12.3* -- 11.0*  HGB 8.1* 6.7* --  PLT 63* -- 73*    Basename 09/03/12 0415 09/02/12 0400  NA 139 142  K 3.8 4.3  CL 106 109  CO2 30 29  GLUCOSE 166* 171*  BUN 32* 32*  CREATININE 0.88 0.91   No results found for this basename: TROPONINI:2,CK,MB:2 in the last 72 hours Hepatic Function Panel No results found for this basename: PROT,ALBUMIN,AST,ALT,ALKPHOS,BILITOT,BILIDIR,IBILI in the last 72 hours No results found for this basename: CHOL in the last 72 hours No results  found for this basename: PROTIME in the last 72 hours  Imaging: Imaging results have been reviewed and Ct Abdomen Pelvis W Contrast  09/02/2012  *RADIOLOGY REPORT*  Clinical Data: History of GI bleeding.  Nodule in the duodenum. Evaluate for potential GIST.  CT ABDOMEN AND PELVIS WITH CONTRAST  Technique:  Multidetector CT imaging of the abdomen and pelvis was performed following the standard protocol during bolus administration of intravenous contrast.  Contrast: OMNIPAQUE IOHEXOL 300 MG/ML  SOLN  Comparison: No priors.  Findings:  Lung Bases: Small bilateral pleural effusions.  Calcified granuloma in the right lower lobe.  Interstitial prominence throughout the visualized lung bases bilaterally.  Peripheral pleural based opacity in the anterolateral right lower lobe.  Cardiomegaly. Calcifications of the mitral annulus.  Atherosclerotic calcifications within the right coronary artery.  Abdomen/Pelvis:  5 mm calcified gallstone in the neck of the gallbladder.  4 mm stone in the common bile duct.  Common bile duct is mildly dilated measuring 10 mm in the porta hepatis.  No intrahepatic biliary ductal dilatation.  The gallbladder itself is only mildly distended, without gallbladder wall thickening, pericholecystic fluid or pericholecystic inflammatory changes to suggest an acute cholecystitis at this time.  Additionally, there is no intrahepatic biliary ductal dilatation.  No focal cystic or solid hepatic lesions are noted.  The appearance of the pancreas, spleen, bilateral adrenal glands and bilateral kidneys is unremarkable.  No definite small bowel associated tumor is identified in the region of the second portion of the duodenum to suggest the presence of GI stromal tumor.  There is atherosclerosis of the abdominal pelvic vasculature, without definite aneurysm  or dissection.  No ascites or pneumoperitoneum and no pathologic distension of bowel.  No definite pathologic lymphadenopathy identified within  the abdomen or pelvis.  There are a few colonic diverticula, without surrounding inflammatory changes to suggest an acute diverticulitis at this time.  The uterus is somewhat heterogeneous in appearance, likely indicative of multifocal fibroids.  Ovaries are atrophic.  Urinary bladder is unremarkable.  A right femoral catheter is noted, extending into the distal right external iliac vein.  Musculoskeletal: There are no aggressive appearing lytic or blastic lesions noted in the visualized portions of the skeleton.  IMPRESSION: 1.  No definite findings to suggest a small bowel associated GI stromal tumor on this CT examination.  2. Cholelithiasis and choledocholthiasis, without definitive evidence of biliary tract obstruction at this time. Although the common bile duct does measure up to 1 cm in diameter, this is favored to be age related as there is no intrahepatic biliary ductal dilatation, and the calcified stone in the common bile duct is in the mid duct. 3.  The appearance of the lower thorax suggest a background of mild interstitial pulmonary edema.  Given the small bilateral pleural effusions (right greater than left) and cardiomegaly, findings are concerning for congestive heart failure.  Clinical correlation is recommended. 4.  Atherosclerosis including the right coronary artery disease. 5.  Mild colonic diverticulosis without findings to suggest acute diverticulitis at this time. 6.  Additional incidental findings, as above.   Original Report Authenticated By: Florencia Reasons, M.D.    Dg Chest Port 1 View  09/02/2012  *RADIOLOGY REPORT*  Clinical Data: PICC line placement.  PORTABLE CHEST - 1 VIEW  Comparison: 08/28/2012  Findings: Left PICC line has been placed.  The tip is in the upper SVC near the confluence of the innominate veins.  Cardiomegaly with vascular congestion.  Suspect mild interstitial edema.  Bibasilar atelectasis and small effusions.  IMPRESSION: Left PICC line tip in the upper SVC at  the confluence of the innominate veins.  Otherwise no change.   Original Report Authenticated By: Cyndie Chime, M.D.     Cardiac Studies:  Assessment/Plan:  Status post Acute upper GI bleeding from submucosal nodule in second portion of the duodenum status post EGD/Endo Clip to control bleeding rule out gastrointestinal stromal tumor  Status post Coumadin toxicity  Status post Hypotensive shock secondary to #1  Hypertension  Non-insulin-dependent diabetes mellitus  Chronic atrial fibrillation  History of DVT  Degenerative joint disease  History of CA of breast  Acute anemia secondary to #1  Thrombocytopenia etiology unclear Plan Check CBC basic metabolic panel and PT INR Hematology consult Will discuss with GI regarding repeat endoscopy Rx for constipation Dr. Algie Coffer on-call for weekend  LOS: 4 days    Odarius Dines N 09/03/2012, 11:49 AM

## 2012-09-03 NOTE — Progress Notes (Signed)
[facility] Progress Note  Subjective:  Still not putting out any stool despite drop in Hgb again O/N.  Received two more units of PRBC's.  No abdominal pain.  Is hungry.  Objective:  Vital signs in last 24 hours: Temp:  [98.2 F (36.8 C)-100.3 F (37.9 C)] 98.9 F (37.2 C) (09/27 0742) Pulse Rate:  [81-110] 98  (09/27 0900) Resp:  [12-26] 24  (09/27 0900) BP: (88-155)/(37-81) 101/41 mmHg (09/27 0900) SpO2:  [92 %-100 %] 100 % (09/27 0900) Weight:  [245 lb 2.4 oz (111.2 kg)] 245 lb 2.4 oz (111.2 kg) (09/27 0615) Last BM Date: 08/30/12 General:   Alert,  Well-developed, in NAD Heart:  Irregularly irregular; no murmurs Pulm:  CTAB.  No W/R/R. Abdomen:  Soft, nontender and nondistended. Normal bowel sounds, without guarding, and without rebound.   Extremities:  Mild pitting edema in B/L LE's. Neurologic:  Alert and oriented;  grossly normal neurologically. Psych:  Alert and cooperative. Normal mood and affect.  Intake/Output from previous day: 09/26 0701 - 09/27 0700 In: 3118.3 [P.O.:1550; I.V.:950; Blood:556.3; IV Piggyback:62] Out: 2725 [Urine:2725] Intake/Output this shift: Total I/O In: 500 [P.O.:400; I.V.:100] Out: -   Lab Results:  Basename 09/03/12 0415 09/02/12 1945 09/02/12 0400 09/01/12 0408  WBC 12.3* -- 11.0* 11.9*  HGB 8.1* 6.7* 7.9* --  HCT 25.0* 20.7* 24.1* --  PLT 63* -- 73* 83*   BMET  Basename 09/03/12 0415 09/02/12 0400 09/01/12 0408  NA 139 142 142  K 3.8 4.3 3.9  CL 106 109 108  CO2 30 29 29   GLUCOSE 166* 171* 107*  BUN 32* 32* 36*  CREATININE 0.88 0.91 1.10  CALCIUM 7.8* 7.9* 8.0*   PT/INR  Basename 09/02/12 0400 09/01/12 0408  LABPROT 25.4* 23.2*  INR 2.44* 2.16*    Ct Abdomen Pelvis W Contrast  09/02/2012  *RADIOLOGY REPORT*  Clinical Data: History of GI bleeding.  Nodule in the duodenum. Evaluate for potential GIST.  CT ABDOMEN AND PELVIS WITH CONTRAST  Technique:  Multidetector CT imaging of the abdomen and pelvis was  performed following the standard protocol during bolus administration of intravenous contrast.  Contrast: OMNIPAQUE IOHEXOL 300 MG/ML  SOLN  Comparison: No priors.  Findings:  Lung Bases: Small bilateral pleural effusions.  Calcified granuloma in the right lower lobe.  Interstitial prominence throughout the visualized lung bases bilaterally.  Peripheral pleural based opacity in the anterolateral right lower lobe.  Cardiomegaly. Calcifications of the mitral annulus.  Atherosclerotic calcifications within the right coronary artery.  Abdomen/Pelvis:  5 mm calcified gallstone in the neck of the gallbladder.  4 mm stone in the common bile duct.  Common bile duct is mildly dilated measuring 10 mm in the porta hepatis.  No intrahepatic biliary ductal dilatation.  The gallbladder itself is only mildly distended, without gallbladder wall thickening, pericholecystic fluid or pericholecystic inflammatory changes to suggest an acute cholecystitis at this time.  Additionally, there is no intrahepatic biliary ductal dilatation.  No focal cystic or solid hepatic lesions are noted.  The appearance of the pancreas, spleen, bilateral adrenal glands and bilateral kidneys is unremarkable.  No definite small bowel associated tumor is identified in the region of the second portion of the duodenum to suggest the presence of GI stromal tumor.  There is atherosclerosis of the abdominal pelvic vasculature, without definite aneurysm or dissection.  No ascites or pneumoperitoneum and no pathologic distension of bowel.  No definite pathologic lymphadenopathy identified within the abdomen or pelvis.  There are a  few colonic diverticula, without surrounding inflammatory changes to suggest an acute diverticulitis at this time.  The uterus is somewhat heterogeneous in appearance, likely indicative of multifocal fibroids.  Ovaries are atrophic.  Urinary bladder is unremarkable.  A right femoral catheter is noted, extending into the distal  right external iliac vein.  Musculoskeletal: There are no aggressive appearing lytic or blastic lesions noted in the visualized portions of the skeleton.  IMPRESSION: 1.  No definite findings to suggest a small bowel associated GI stromal tumor on this CT examination.  2. Cholelithiasis and choledocholthiasis, without definitive evidence of biliary tract obstruction at this time. Although the common bile duct does measure up to 1 cm in diameter, this is favored to be age related as there is no intrahepatic biliary ductal dilatation, and the calcified stone in the common bile duct is in the mid duct. 3.  The appearance of the lower thorax suggest a background of mild interstitial pulmonary edema.  Given the small bilateral pleural effusions (right greater than left) and cardiomegaly, findings are concerning for congestive heart failure.  Clinical correlation is recommended. 4.  Atherosclerosis including the right coronary artery disease. 5.  Mild colonic diverticulosis without findings to suggest acute diverticulitis at this time. 6.  Additional incidental findings, as above.   Original Report Authenticated By: Florencia Reasons, M.D.    Dg Chest Port 1 View  09/02/2012  *RADIOLOGY REPORT*  Clinical Data: PICC line placement.  PORTABLE CHEST - 1 VIEW  Comparison: 08/28/2012  Findings: Left PICC line has been placed.  The tip is in the upper SVC near the confluence of the innominate veins.  Cardiomegaly with vascular congestion.  Suspect mild interstitial edema.  Bibasilar atelectasis and small effusions.  IMPRESSION: Left PICC line tip in the upper SVC at the confluence of the innominate veins.  Otherwise no change.   Original Report Authenticated By: Cyndie Chime, M.D.     Assessment / Plan: -Submucosal nodule with adherent clot or vessel in duodenum: Seen on EGD 9/25. Injected with epi and clipped.  No sign of small bowel GIST on CT scan.  -GIB: Secondary to the above. No sign of active bleeding since  admission.  -ABL anemia: Due to the above. Hgb down again last night; was given another 2 units of PRBC's. Once again, no sign of active bleeding.  -History of DVT and need for coumadin.  Would try to keep INR <2 for 5-7 days.  -Thrombocytopenia: Platelets trending down since admission. -Choledocholithiasis:  Seen incidentally on CT scan 9/26.  No sign of obstruction on imaging and patient asymptomatic.  LFT's were normal when last check on 9/23.  *Will recheck LFT's. *Monitor Hgb and transfuse further if needed. *Will likely need EUS to evaluate duodenal nodule and ERCP to remove CBD stone both electively as outpatient. *Continue IV PPI BID. *Continue clear liquids for now. *Relayed to nursing to address thrombocytopenia with primary service.    LOS: 4 days   ZEHR, JESSICA D.  09/03/2012, 9:19 AM  Pager number [phone]   I have taken an interval history, reviewed the chart and examined the patient. I agree with the extender's note, impression and recommendations. No signs of active bleeding but Hb has drifted down. Recheck CBC and PT/INR. If she has obvious GI bleeding or Hb keeps drifting down will need to repeat her EGD. Elective EUS and ERCP once bleeding is controlled. Will recheck LFTs.  Venita Lick. Russella Dar MD [facility]

## 2012-09-03 NOTE — Consult Note (Addendum)
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 Tumor was ER+  2. S/p radiation therapy to the right breast completed November 2011.  3. Tamoxifen 20 mg from October 21 2010 - November 13, 2010 but discontinued due to possible DVT of the right leg.  3. Switched to aromasin 25 daily starting 11/20/10   Last ECOG during her 9/17 office visit was: 2 - Symptomatic, <50% confined to bed   CURRENT THERAPY: aromasin 25 mg daily      NWG:NFAOZH [name] is a 78 y.o. female patient of [name] at the [facility] with a history of breast cancer as described above, last seen in our office on 08/24/2012,at which time, she was tolerating well aromasin therapy. Platelets on 9/17 were 166,000. Her H/H was 8.8 and 26.9 respectively, with MCV of 102.3. Her white count was normal at 8.4t. The patient is on chronic coumadin as well as ASA 81 mg qd for history of DVT and chronic atrial fibrillation.In addition, she takes Celebrex 200 mg bid. She was  evaluated at the emergency department on 08/30/2012 due to generalized weakness, associated with shortness of breath, and black tarry stools. She was noted to have a hemoglobin of 5.6,with a hematocrit of 17.8, white count of 16.5, ANC of 12.2 and platelets of 157,000 with INR of 5.6. The patient was hypotensive, requiring fluid resuscitation. She received 2 units packed rbc's, fresh frozen plasma and vitamin K at the emergency department. Of note, these symptoms were preceded on 08/28/2012 with an episode of hypoglycemia at which time her INR was 4.9.Hemoccult was positive. She was evaluated by [name] on 08/30/2012, and proceeded with EGD on 09/01/2012 after reversal of INR and transfusion of blood. She appeared to be having acute upper GI bleeding from soft mucosal nodule in the second portion of the duodenum, requiring clipping to control the bleeding. Samples were sent for evaluation to rule out gastrointestinal stromal tumor. Results are currently pending She continues to have GI requiring further units of blood to improve her hemodynamic status.as of 9/26there was no upright re-bleeding in the area. However, her INR was elevated at 2.44.Platelets on 08/31/2012 where 160,000, eventually dropping to 83,000 on 09/01/2012, despite the absence off anticoagulation see during the hospitalization. On 9/26 her platelets were 73,000. Today, her hemoglobin is 8.1 with hematocrit of 25 after receiving 2 more units of packed rbc's on 09/02/2012. Platelet count is 63,000 with a white count of 12.3. Her creatinine levels are normal at 0.88.bilirubin as of 08/30/2012 was 0.5 . Currently, the patient is lethargic, but able to follow commands and respond appropriately. She denies dyspnea.  Smear has been ordered for review. In addition, prior smears have been requested, for comparison in view of multiple transfusions received.  No family history of hematological disorders. No gum bleed. No epistaxis or hemoptysis. Denies easy bruising. She denies any gum  or nose bleeding. Never had a bone marrow biopsy.  We were kindly asked to see the patient with recommendations.   PMH:  Past Medical History  Diagnosis Date  . 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 DVT (deep venous thrombosis)   . Peripheral neuropathy   . Arthritis   . Diabetes mellitus   . Cancer     DCIS s/p lumpectomy and radiation 2011  . Hyperlipidemia   . Hypertension   . CHF (congestive heart failure)     Surgeries:  Past Surgical History  Procedure Date  . Breast surgery     2012  . Esophagogastroduodenoscopy  09/01/2012    Procedure: ESOPHAGOGASTRODUODENOSCOPY (EGD);  Surgeon: Diane Dare, MD,FACG;  Location: [facility] ENDOSCOPY;  Service: Endoscopy;  Laterality: N/A;    Allergies:  Allergies  Allergen Reactions  . Peach Flavor     "rash"    Medications:   Prior to Admission:  Prescriptions prior to admission  Medication Sig Dispense Refill  . amLODipine-valsartan (EXFORGE) 5-320 MG per tablet Take 1 tablet by mouth daily.      Marland Kitchen aspirin 81 MG tablet Take 81 mg by mouth daily.      Marland Kitchen atenolol (TENORMIN) 50 MG tablet Take 50 mg by mouth daily.      Marland Kitchen atorvastatin (LIPITOR) 20 MG tablet Take 20 mg by mouth daily.      . celecoxib (CELEBREX) 200 MG capsule Take 200 mg by mouth 2 (two) times daily.      . digoxin (LANOXIN) 0.125 MG tablet Take 125 mcg by mouth daily.      Marland Kitchen exemestane (AROMASIN) 25 MG tablet Take 25 mg by mouth daily after breakfast.      . furosemide (LASIX) 40 MG tablet Take 40 mg by mouth daily.      Marland Kitchen glimepiride (AMARYL) 2 MG tablet Take 2 mg by mouth daily before breakfast.      . potassium chloride SA (K-DUR,KLOR-CON) 20 MEQ tablet Take 20 mEq by mouth 2 (two) times daily.      Marland Kitchen warfarin (COUMADIN) 2 MG tablet Take 2-4 mg by mouth daily. Takes 4mg  (2 tablets) on Thurs and Saturday and all other days takes 2 mg        VHQ:IONGEX chloride, iohexol, magnesium hydroxide, sodium chloride  ROS: Constitutional: Positive for weight loss. Negative for fever, chills or  night sweats. positive for  fatigue.  Eyes: Negative for blurred vision and double vision.  Respiratory: Negative for cough. No hemoptysis. No shortness of breath. No pleuritic chest pain.  Cardiovascular: Negative for chest pain. No palpitations.  GI: Negative for  nausea, vomiting, diarrhea . Positive for constipation. No change in bowel caliber. No abdominal pain.  GU: Negative for hematuria. No loss of urinary control.No urinary retention. Skin: Negative for itching. No rash. No petechia. No easy bruising.    Neurological: No headaches. No motor or sensory deficits.  Family History:    History reviewed. No pertinent family history.  No family history of bleeding disorders.  Social History:  reports that she has never smoked. She has never used smokeless tobacco. She reports that she does not drink alcohol or use illicit drugs.  Physical Exam    Filed Vitals:   09/03/12 1235  BP:   Pulse:   Temp: 98.8 F (37.1 C)  Resp:      Filed Weights   09/01/12 0400 09/02/12 0600 09/03/12 0615  Weight: 242 lb 4.6 oz (109.9 kg) 244 lb  14.9 oz (111.1 kg) 245 lb 2.4 oz (111.2 kg)   General:   78 year old  in no acute distress A. and O. x3  well-developed and well-nourished.  HEENT: Normocephalic, atraumatic, PERRLA. Oral cavity without thrush or lesions. Neck supple. no thyromegaly, no cervical or supraclavicular adenopathy  Lungs clear bilaterally . No wheezing, rhonchi or rales. No axillary masses. Breasts: not examined. Cardiac irregularly  irregular rate and rhythm normal S1-S2, 2/6 systolic murmur , rubs or gallops Abdomen soft nontender , bowel sounds x4. No HSM. No masses palpable.  GU/rectal: deferred. Extremities no clubbing cyanosis , mild bilateral edema. No bruising or petechial rash Musculoskeletal: no spinal tenderness.  Neuro: Non Focal  Labs:  CBC   Lab 09/03/12 0415 09/02/12 1945 09/02/12 0400 09/01/12 2000 09/01/12 0925 09/01/12 0408 08/31/12 0100 08/30/12 1946 08/30/12 1153 08/28/12 1551  WBC 12.3* -- 11.0* -- -- 11.9* 14.0* 14.6* -- --  HGB 8.1* 6.7* 7.9* 7.4* 8.3* -- -- -- -- --  HCT 25.0* 20.7* 24.1* 22.2* 24.7* -- -- -- -- --  PLT 63* -- 73* -- -- 83* 116* 120* -- --  MCV 90.9 -- 93.8 -- -- 91.7 93.2 98.1 -- --  MCH 29.5 -- 30.7 -- -- 30.4 31.0 32.2 -- --  MCHC 32.4 -- 32.8 -- -- 33.2 33.2 32.9 -- --  RDW 18.8* -- 18.1* -- -- 18.0* 17.2* 18.1* -- --  LYMPHSABS -- -- -- -- -- -- -- -- 3.5 2.1  MONOABS -- -- -- -- -- -- -- -- 0.8 0.7  EOSABS -- -- -- -- -- -- -- --  0.0 0.1  BASOSABS -- -- -- -- -- -- -- -- 0.0 0.0  BANDABS -- -- -- -- -- -- -- -- -- --     CMP    Lab 09/03/12 0415 09/02/12 0400 09/01/12 0408 08/31/12 0502 08/30/12 1946 08/28/12 1551  NA 139 142 142 143 141 --  K 3.8 4.3 3.9 4.7 5.1 --  CL 106 109 108 110 110 --  CO2 30 29 29 27 27  --  GLUCOSE 166* 171* 107* 109* 146* --  BUN 32* 32* 36* 41* 39* --  CREATININE 0.88 0.91 1.10 1.19* 1.21* --  CALCIUM 7.8* 7.9* 8.0* 8.3* 8.7 --  MG -- -- -- -- 2.5 --  AST -- -- -- -- 19 31  ALT -- -- -- -- 11 14  ALKPHOS -- -- -- -- 52 98  BILITOT -- -- -- -- 0.5 0.7        Component Value Date/Time   BILITOT 0.5 08/30/2012 1946   BILITOT 0.90 08/24/2012 0859      Lab 09/02/12 0400 09/01/12 0408 08/31/12 2000 08/31/12 0100 08/30/12 1127  INR 2.44* 2.16* 2.86* 3.62* 5.61*  PROTIME -- -- -- -- --    Imaging Studies:  Dg Chest 2 View  08/28/2012  *RADIOLOGY REPORT*  Clinical Data: Shortness of breath  CHEST - 2 VIEW  Comparison: 02/16/2012  Findings: Cardiomegaly again noted.  Stable central mild vascular congestion and chronic mild interstitial prominence.  No convincing pulmonary edema.  No focal infiltrate.  Stable mild degenerative changes thoracic spine.  IMPRESSION: No significant change.  Stable central vascular congestion and chronic mild interstitial prominence.  No convincing pulmonary edema.  Cardiomegaly again noted.   Original Report Authenticated By: Natasha Mead, M.D.    Ct Abdomen Pelvis W Contrast  09/02/2012  *RADIOLOGY REPORT*  Clinical Data: History of GI bleeding.  Nodule in the duodenum. Evaluate for potential  GIST.  CT ABDOMEN AND PELVIS WITH CONTRAST  Technique:  Multidetector CT imaging of the abdomen and pelvis was performed following the standard protocol during bolus administration of intravenous contrast.  Contrast: OMNIPAQUE IOHEXOL 300 MG/ML  SOLN  Comparison: No priors.  Findings:  Lung Bases: Small bilateral pleural effusions.  Calcified granuloma in the  right lower lobe.  Interstitial prominence throughout the visualized lung bases bilaterally.  Peripheral pleural based opacity in the anterolateral right lower lobe.  Cardiomegaly. Calcifications of the mitral annulus.  Atherosclerotic calcifications within the right coronary artery.  Abdomen/Pelvis:  5 mm calcified gallstone in the neck of the gallbladder.  4 mm stone in the common bile duct.  Common bile duct is mildly dilated measuring 10 mm in the porta hepatis.  No intrahepatic biliary ductal dilatation.  The gallbladder itself is only mildly distended, without gallbladder wall thickening, pericholecystic fluid or pericholecystic inflammatory changes to suggest an acute cholecystitis at this time.  Additionally, there is no intrahepatic biliary ductal dilatation.  No focal cystic or solid hepatic lesions are noted.  The appearance of the pancreas, spleen, bilateral adrenal glands and bilateral kidneys is unremarkable.  No definite small bowel associated tumor is identified in the region of the second portion of the duodenum to suggest the presence of GI stromal tumor.  There is atherosclerosis of the abdominal pelvic vasculature, without definite aneurysm or dissection.  No ascites or pneumoperitoneum and no pathologic distension of bowel.  No definite pathologic lymphadenopathy identified within the abdomen or pelvis.  There are a few colonic diverticula, without surrounding inflammatory changes to suggest an acute diverticulitis at this time.  The uterus is somewhat heterogeneous in appearance, likely indicative of multifocal fibroids.  Ovaries are atrophic.  Urinary bladder is unremarkable.  A right femoral catheter is noted, extending into the distal right external iliac vein.  Musculoskeletal: There are no aggressive appearing lytic or blastic lesions noted in the visualized portions of the skeleton.  IMPRESSION: 1.  No definite findings to suggest a small bowel associated GI stromal tumor on this CT  examination.  2. Cholelithiasis and choledocholthiasis, without definitive evidence of biliary tract obstruction at this time. Although the common bile duct does measure up to 1 cm in diameter, this is favored to be age related as there is no intrahepatic biliary ductal dilatation, and the calcified stone in the common bile duct is in the mid duct. 3.  The appearance of the lower thorax suggest a background of mild interstitial pulmonary edema.  Given the small bilateral pleural effusions (right greater than left) and cardiomegaly, findings are concerning for congestive heart failure.  Clinical correlation is recommended. 4.  Atherosclerosis including the right coronary artery disease. 5.  Mild colonic diverticulosis without findings to suggest acute diverticulitis at this time. 6.  Additional incidental findings, as above.   Original Report Authenticated By: Florencia Reasons, M.D.    Dg Chest Port 1 View  09/02/2012  *RADIOLOGY REPORT*  Clinical Data: PICC line placement.  PORTABLE CHEST - 1 VIEW  Comparison: 08/28/2012  Findings: Left PICC line has been placed.  The tip is in the upper SVC near the confluence of the innominate veins.  Cardiomegaly with vascular congestion.  Suspect mild interstitial edema.  Bibasilar atelectasis and small effusions.  IMPRESSION: Left PICC line tip in the upper SVC at the confluence of the innominate veins.  Otherwise no change.   Original Report Authenticated By: Cyndie Chime, M.D.      A/P:  78 y.o. female admitted to the emergency Department twith GI bleed while on anti-coagulation, found to have thrombocytopenia, with values continuing to decrease despite discontinuation of Coumadin ( which she takes for chronic atrial fibrillation and a history of DVT), ASA and Celebrex. No platelet transfusion has been received to date. Smear has been ordered for review. DIC panel to be ordered.   [facility] E 09/03/2012 12:45 PM  I have seen and examined the patient and  agree with above.  Laurice Record., M.D.    Attending Addendum: 78 year old woman patient of: With history of a 2.2 cm DCIS status post lumpectomy with radiation, currently receiving Aromasin since 11/20/2010. Patient has a history of deep vein thrombosis for which she was on Coumadin. Presented to the emergency room with lower GI bleeding with black tarry stools. INR was found to be over therapeutic at 5.61. Admitting hemoglobin and hematocrit was 6.8 and 20.7 respectively. She received several units of packed red blood cells. GI consult recommended an EGD.  EGD had shown a submucosal nodule with clots in the duodenum and and Endo Clip was applied. Hemoglobin and platelets continues to decline. INR has not normalized. D-dimer was slightly elevated at 3.63. Fibrinogen was 283. Liver function tests were unremarkable. Review peripheral smear which shows no evidence of schistocytes.   Patient does not appear to have any further signs of bleeding thus we'll continue to monitor the hemoglobin and hematocrits and transfuse as needed. Would hold off platelet transfusion at this time unless patient begins to bleed. Continue to monitor with daily CBC.  Dr. Welton Flakes will see patient later this evening and assume care.  Thanks   ODOGWU,LAURETTA I.   I have seen the patient. She will be undergoing endoscopy by Dr. Melvia Heaps. Rest as above by Dr. Dalene Carrow. For any questions this evening please call the MD on call.  Drue Second, MD Medical/Oncology [facility] [phone] (beeper) [phone] (Office)  09/03/2012, 5:29 PM

## 2012-09-03 NOTE — Progress Notes (Signed)
Endoscopy demonstrated beeding at the ulcer site of a submucosal duodenal nodule in the second portion of the duodenum with a visible vessel. The ulcer and vessel were successfully treated with 2 endoclips and epinephrine injection. There was complete hemostasis.

## 2012-09-03 NOTE — Op Note (Addendum)
[facility] [address] [place], [address]   [email or web address] PROCEDURE REPORT  PATIENT: [name], [name]  MR#: [id number] BIRTHDATE: 17-Mar-1926 , 85  yrs. old GENDER: Female ENDOSCOPIST: Louis Meckel, MD REFERRED BY:  Rinaldo Cloud, M.D. PROCEDURE DATE:  09/03/2012 PROCEDURE:  EGD w/ control of bleeding ASA CLASS:     Class III INDICATIONS: MEDICATIONS: These medications were titrated to patient response per physician's verbal order, Versed 3 mg IV, Fentanyl 50 mcg IV, and Robinul 0.2 mg IV TOPICAL ANESTHETIC: Lidocaine Spray  DESCRIPTION OF PROCEDURE: After the risks benefits and alternatives of the procedure were thoroughly explained, informed consent was obtained.  The Pentax Gastroscope [id number] endoscope was introduced through the mouth and advanced to the third portion of the duodenum. Without limitations.  The instrument was slowly withdrawn as the mucosa was fully examined.    Again noted there is a 1.5-2cm submucosal mass in the second portion of the duodenum with a 5mm ulcer at the apex and visible vessel. Fresh blood was seen in the stomach and duodenum. 2 endoscopic clips were applied to the vessel and 5cc 1:10,000 epinephrine was injected into the base and around the ulcer.  1 endoscopic clip was applied but fell off.  There was no active bleeding at the conclusion of the procedure.   3-61mm nonbleeding nodule was seen in the gastric fundus.   3-7mm nonbleeding nodule was seen in the gastric fundus.   The remainder of the upper endoscopy exam was otherwise normal.  Retroflexed views revealed no abnormalities.     The scope was then withdrawn from the patient and the procedure completed.  COMPLICATIONS: There were no complications. ENDOSCOPIC IMPRESSION: 1.   Bleeding Duodenal nodule with ulcer, visible vessel at apex - s/p application of endoclips and epinephrine injection achieving complete  hemostasis 2.  Gastric Nodule   RECOMMENDATIONS:Continue PPI therapy; hold coumadin  REPEAT EXAM:  Activated:  09/03/2012 6:08 PM   CC:  PATIENT NAME:  [name], [name] MR#: [id number]

## 2012-09-04 LAB — CBC
HCT: 27.6 % — ABNORMAL LOW (ref 36.0–46.0)
Hemoglobin: 9 g/dL — ABNORMAL LOW (ref 12.0–15.0)
MCH: 29.1 pg (ref 26.0–34.0)
MCHC: 32.6 g/dL (ref 30.0–36.0)
MCV: 89.3 fL (ref 78.0–100.0)
Platelets: 58 K/uL — ABNORMAL LOW (ref 150–400)
RBC: 3.09 MIL/uL — ABNORMAL LOW (ref 3.87–5.11)
RDW: 17.7 % — ABNORMAL HIGH (ref 11.5–15.5)
WBC: 10.6 K/uL — ABNORMAL HIGH (ref 4.0–10.5)

## 2012-09-04 LAB — TYPE AND SCREEN
ABO/RH(D): O POS
Antibody Screen: NEGATIVE
Unit division: 0
Unit division: 0

## 2012-09-04 LAB — GLUCOSE, CAPILLARY
Glucose-Capillary: 133 mg/dL — ABNORMAL HIGH (ref 70–99)
Glucose-Capillary: 185 mg/dL — ABNORMAL HIGH (ref 70–99)

## 2012-09-04 LAB — HEPATIC FUNCTION PANEL
ALT: 10 U/L (ref 0–35)
AST: 20 U/L (ref 0–37)
Albumin: 2.4 g/dL — ABNORMAL LOW (ref 3.5–5.2)
Alkaline Phosphatase: 47 U/L (ref 39–117)
Bilirubin, Direct: 0.2 mg/dL (ref 0.0–0.3)
Indirect Bilirubin: 0.3 mg/dL (ref 0.3–0.9)
Total Bilirubin: 0.5 mg/dL (ref 0.3–1.2)
Total Protein: 4.8 g/dL — ABNORMAL LOW (ref 6.0–8.3)

## 2012-09-04 LAB — BASIC METABOLIC PANEL WITH GFR
BUN: 26 mg/dL — ABNORMAL HIGH (ref 6–23)
CO2: 31 meq/L (ref 19–32)
Calcium: 7.6 mg/dL — ABNORMAL LOW (ref 8.4–10.5)
Chloride: 105 meq/L (ref 96–112)
Creatinine, Ser: 0.93 mg/dL (ref 0.50–1.10)
GFR calc Af Amer: 64 mL/min — ABNORMAL LOW
GFR calc non Af Amer: 55 mL/min — ABNORMAL LOW
Glucose, Bld: 138 mg/dL — ABNORMAL HIGH (ref 70–99)
Potassium: 3.6 meq/L (ref 3.5–5.1)
Sodium: 140 meq/L (ref 135–145)

## 2012-09-04 LAB — HEMOGLOBIN AND HEMATOCRIT, BLOOD
HCT: 26.1 % — ABNORMAL LOW (ref 36.0–46.0)
Hemoglobin: 8.6 g/dL — ABNORMAL LOW (ref 12.0–15.0)

## 2012-09-04 LAB — PROTIME-INR
INR: 1.5 — ABNORMAL HIGH (ref 0.00–1.49)
Prothrombin Time: 17.7 s — ABNORMAL HIGH (ref 11.6–15.2)

## 2012-09-04 MED ORDER — INFLUENZA VIRUS VACC SPLIT PF IM SUSP
0.5000 mL | INTRAMUSCULAR | Status: AC
Start: 2012-09-05 — End: 2012-09-05
  Administered 2012-09-05: 0.5 mL via INTRAMUSCULAR
  Filled 2012-09-04: qty 0.5

## 2012-09-04 NOTE — Progress Notes (Signed)
Patient ID: [name], female   DOB: April 04, 1926, 78 y.o.   MRN: [id number] [facility] Progress Note  Subjective:  Up in chair-feels good. No further bleeding since EGD last pm. No c/o pain Hgb up to 9.0.    Objective:  Vital signs in last 24 hours: Temp:  [98.2 F (36.8 C)-98.8 F (37.1 C)] 98.6 F (37 C) (09/28 0713) Pulse Rate:  [47-149] 79  (09/28 1000) Resp:  [12-24] 21  (09/28 1000) BP: (89-145)/(33-96) 115/59 mmHg (09/28 1000) SpO2:  [88 %-100 %] 100 % (09/28 1000) Weight:  [246 lb 11.2 oz (111.902 kg)] 246 lb 11.2 oz (111.902 kg) (09/28 0653) Last BM Date: 09/03/12 General:   Alert,  Well-developed,    in NAD Heart:  Regular rate and rhythm; no murmurs Pulm;clear Abdomen:  Soft, nontender and nondistended. Normal bowel sounds Neurologic:  Alert and  oriented x4;  grossly normal neurologically. Psych:  Alert and cooperative. Normal mood and affect.  Intake/Output from previous day: 09/27 0701 - 09/28 0700 In: 2831.1 [P.O.:1020; I.V.:1220; Blood:577.1; IV Piggyback:14] Out: 2076 [Urine:2075; Stool:1] Intake/Output this shift: Total I/O In: 510 [P.O.:360; I.V.:150] Out: -   Lab Results:  Basename 09/04/12 0350 09/03/12 1354 09/03/12 1300 09/03/12 0415  WBC 10.6* -- 11.5* 12.3*  HGB 9.0* -- 7.1* 8.1*  HCT 27.6* -- 21.6* 25.0*  PLT 58* 51* 57* --   BMET  Basename 09/04/12 0350 09/03/12 1300 09/03/12 0415  NA 140 139 139  K 3.6 3.8 3.8  CL 105 105 106  CO2 31 31 30   GLUCOSE 138* 168* 166*  BUN 26* 33* 32*  CREATININE 0.93 0.90 0.88  CALCIUM 7.6* 7.6* 7.8*   LFT  Basename 09/04/12 0350  PROT 4.8*  ALBUMIN 2.4*  AST 20  ALT 10  ALKPHOS 47  BILITOT 0.5  BILIDIR 0.2  IBILI 0.3   PT/INR  Basename 09/04/12 0350 09/03/12 1354  LABPROT 17.7* 17.7*  INR 1.50* 1.50*    Assessment / Plan: #1 78 yo female with recurrent acute GI bleeding secondary to 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 Stable s/p EGD  with epi injection an Endoclipping last pm Continue serial hgb's Advance diet She will need Endoscopic Ultrasound-will arrange thru Dr. Christella Hartigan #2 cholelithiasis/choledocholithiasis- asymptomatic #3 Coagulopathy-improved #4 thrombocytopenia- etiology not clear-hematology following Active Problems:  Hemorrhage of gastrointestinal tract, unspecified  Warfarin-induced coagulopathy  Acute posthemorrhagic anemia  Calculus of bile duct without mention of cholecystitis or obstruction  Calculus of gallbladder without mention of cholecystitis or obstruction  Thrombocytopenia  Duodenal ulcer with hemorrhage     LOS: 5 days   [name]  09/04/2012, 10:08 AM

## 2012-09-04 NOTE — Progress Notes (Signed)
I have personally taken an interval history, reviewed the chart, and examined the patient.  I agree with the extender's note, impression and recommendations.   Barbette Hair. Arlyce Dice, M.D., [facility] Cell [phone]  .

## 2012-09-04 NOTE — Progress Notes (Signed)
Subjective:  Feeling better post visible vessel clipping by Dr. Arlyce Dice.  Objective:  Vital Signs in the last 24 hours: Temp:  [98.2 F (36.8 C)-98.8 F (37.1 C)] 98.3 F (36.8 C) (09/28 1118) Pulse Rate:  [47-149] 79  (09/28 1200) Cardiac Rhythm:  [-] Atrial fibrillation (09/28 0730) Resp:  [12-24] 18  (09/28 1200) BP: (89-145)/(33-96) 111/45 mmHg (09/28 1200) SpO2:  [88 %-100 %] 100 % (09/28 1200) Weight:  [111.902 kg (246 lb 11.2 oz)] 111.902 kg (246 lb 11.2 oz) (09/28 2956)  Physical Exam: BP Readings from Last 1 Encounters:  09/04/12 111/45    Wt Readings from Last 1 Encounters:  09/04/12 111.902 kg (246 lb 11.2 oz)    Weight change: 0.703 kg (1 lb 8.8 oz)  HEENT: [place]/AT, Eyes-Brown, PERL, EOMI, Conjunctiva-Pale, Sclera-Non-icteric Neck: No JVD, No bruit, Trachea midline. Lungs:  Clear, Bilateral. Cardiac:  Regular rhythm, normal S1 and S2, no S3.  Abdomen:  Soft. + Bowel sounds Extremities:  No edema present. No cyanosis. No clubbing. CNS: AxOx3, Cranial nerves grossly intact, moves all 4 extremities. Right handed. Skin: Warm and dry.   Intake/Output from previous day: 09/27 0701 - 09/28 0700 In: 2831.1 [P.O.:1020; I.V.:1220; Blood:577.1; IV Piggyback:14] Out: 2076 [Urine:2075; Stool:1]    Lab Results: BMET    Component Value Date/Time   NA 140 09/04/2012 0350   NA 140 08/24/2012 0859   K 3.6 09/04/2012 0350   K 3.9 08/24/2012 0859   CL 105 09/04/2012 0350   CL 106 08/24/2012 0859   CO2 31 09/04/2012 0350   CO2 24 08/24/2012 0859   GLUCOSE 138* 09/04/2012 0350   GLUCOSE 103* 08/24/2012 0859   BUN 26* 09/04/2012 0350   BUN 17.0 08/24/2012 0859   CREATININE 0.93 09/04/2012 0350   CREATININE 1.2* 08/24/2012 0859   CALCIUM 7.6* 09/04/2012 0350   CALCIUM 8.3* 08/24/2012 0859   GFRNONAA 55* 09/04/2012 0350   GFRAA 64* 09/04/2012 0350   CBC    Component Value Date/Time   WBC 10.6* 09/04/2012 0350   WBC 8.4 08/24/2012 0859   RBC 3.09* 09/04/2012 0350   RBC 2.63* 08/24/2012  0859   HGB 9.0* 09/04/2012 0350   HGB 8.8* 08/24/2012 0859   HCT 27.6* 09/04/2012 0350   HCT 26.9* 08/24/2012 0859   PLT 58* 09/04/2012 0350   PLT 166 08/24/2012 0859   MCV 89.3 09/04/2012 0350   MCV 102.3* 08/24/2012 0859   MCH 29.1 09/04/2012 0350   MCH 33.6 08/24/2012 0859   MCHC 32.6 09/04/2012 0350   MCHC 32.9 08/24/2012 0859   RDW 17.7* 09/04/2012 0350   RDW 14.4 08/24/2012 0859   LYMPHSABS 2.1 09/03/2012 1300   LYMPHSABS 2.2 08/24/2012 0859   MONOABS 0.8 09/03/2012 1300   MONOABS 0.8 08/24/2012 0859   EOSABS 0.1 09/03/2012 1300   EOSABS 0.2 08/24/2012 0859   BASOSABS 0.0 09/03/2012 1300   BASOSABS 0.0 08/24/2012 0859   CARDIAC ENZYMES Lab Results  Component Value Date   TROPONINI <0.30 08/28/2012    Assessment/Plan:  Patient Active Hospital Problem List: Hemorrhage of gastrointestinal tract, unspecified  Warfarin-induced coagulopathy  Acute posthemorrhagic anemia  Calculus of bile duct without mention of cholecystitis or obstruction  Calculus of gallbladder without mention of cholecystitis or obstruction  Thrombocytopenia  Duodenal ulcer with hemorrhage  Follow with GI   LOS: 5 days    Diane Cobb  MD  09/04/2012, 12:45 PM

## 2012-09-05 LAB — GLUCOSE, CAPILLARY
Glucose-Capillary: 179 mg/dL — ABNORMAL HIGH (ref 70–99)
Glucose-Capillary: 181 mg/dL — ABNORMAL HIGH (ref 70–99)

## 2012-09-05 MED ORDER — SODIUM CHLORIDE 0.9 % IJ SOLN
10.0000 mL | INTRAMUSCULAR | Status: DC | PRN
  Administered 2012-09-06 (×2): 10 mL

## 2012-09-05 NOTE — Progress Notes (Signed)
Transferred to 2037 via wheelchair and monitor, placed in bed with SCD's, NT in room to check pt

## 2012-09-05 NOTE — Progress Notes (Signed)
Subjective:  Feeling better but weak. Stable Hgb. Of 8.8  Objective:  Vital Signs in the last 24 hours: Temp:  [98.1 F (36.7 C)-98.8 F (37.1 C)] 98.4 F (36.9 C) (09/29 0715) Pulse Rate:  [55-102] 101  (09/29 0900) Cardiac Rhythm:  [-] Atrial fibrillation (09/29 0700) Resp:  [13-21] 17  (09/29 0900) BP: (98-148)/(28-95) 121/41 mmHg (09/29 0900) SpO2:  [98 %-100 %] 100 % (09/29 0900) Weight:  [111.1 kg (244 lb 14.9 oz)] 111.1 kg (244 lb 14.9 oz) (09/29 0300)  Physical Exam: BP Readings from Last 1 Encounters:  09/05/12 121/41    Wt Readings from Last 1 Encounters:  09/05/12 111.1 kg (244 lb 14.9 oz)    Weight change: -0.802 kg (-1 lb 12.3 oz)  HEENT: [place]/AT, Eyes-Brown, PERL, EOMI, Conjunctiva-Pale, Sclera-Non-icteric Neck: No JVD, No bruit, Trachea midline. Lungs:  Clear, Bilateral. Cardiac:  Regular rhythm, normal S1 and S2, no S3.  Abdomen:  Soft, non-tender. Extremities:  No edema present. No cyanosis. No clubbing. CNS: AxOx3, Cranial nerves grossly intact, moves all 4 extremities. Right handed. Skin: Warm and dry.   Intake/Output from previous day: 09/28 0701 - 09/29 0700 In: 2290 [P.O.:1140; I.V.:1150] Out: 550 [Urine:550]    Lab Results: BMET    Component Value Date/Time   NA 140 09/04/2012 0350   NA 140 08/24/2012 0859   K 3.6 09/04/2012 0350   K 3.9 08/24/2012 0859   CL 105 09/04/2012 0350   CL 106 08/24/2012 0859   CO2 31 09/04/2012 0350   CO2 24 08/24/2012 0859   GLUCOSE 138* 09/04/2012 0350   GLUCOSE 103* 08/24/2012 0859   BUN 26* 09/04/2012 0350   BUN 17.0 08/24/2012 0859   CREATININE 0.93 09/04/2012 0350   CREATININE 1.2* 08/24/2012 0859   CALCIUM 7.6* 09/04/2012 0350   CALCIUM 8.3* 08/24/2012 0859   GFRNONAA 55* 09/04/2012 0350   GFRAA 64* 09/04/2012 0350   CBC    Component Value Date/Time   WBC 10.6* 09/04/2012 0350   WBC 8.4 08/24/2012 0859   RBC 3.09* 09/04/2012 0350   RBC 2.63* 08/24/2012 0859   HGB 8.8* 09/05/2012 0400   HGB 8.8* 08/24/2012 0859   HCT 26.5* 09/05/2012 0400   HCT 26.9* 08/24/2012 0859   PLT 58* 09/04/2012 0350   PLT 166 08/24/2012 0859   MCV 89.3 09/04/2012 0350   MCV 102.3* 08/24/2012 0859   MCH 29.1 09/04/2012 0350   MCH 33.6 08/24/2012 0859   MCHC 32.6 09/04/2012 0350   MCHC 32.9 08/24/2012 0859   RDW 17.7* 09/04/2012 0350   RDW 14.4 08/24/2012 0859   LYMPHSABS 2.1 09/03/2012 1300   LYMPHSABS 2.2 08/24/2012 0859   MONOABS 0.8 09/03/2012 1300   MONOABS 0.8 08/24/2012 0859   EOSABS 0.1 09/03/2012 1300   EOSABS 0.2 08/24/2012 0859   BASOSABS 0.0 09/03/2012 1300   BASOSABS 0.0 08/24/2012 0859   CARDIAC ENZYMES Lab Results  Component Value Date   TROPONINI <0.30 08/28/2012    Assessment/Plan:  Patient Active Hospital Problem List:  Duodenal ulcer with hemorrhage Warfarin-induced coagulopathy  Acute posthemorrhagic anemia  Calculus of bile duct without mention of cholecystitis or obstruction  Calculus of gallbladder without mention of cholecystitis or obstruction  Thrombocytopenia  Weakness  Transfer to Telebed   LOS: 6 days    Orpah Cobb  MD  09/05/2012, 9:54 AM

## 2012-09-05 NOTE — Progress Notes (Signed)
Report to 2000 RN 

## 2012-09-05 NOTE — Progress Notes (Signed)
Patient ID: [name], female   DOB: 05/26/1926, 78 y.o.   MRN: [id number] [facility] Progress Note  Subjective: No complaints this am- ate solid food- no c/o abdominal pain, or nausea. No further melena  Nursing reports very weak unable to stand without help  Objective:  Vital signs in last 24 hours: Temp:  [98.1 F (36.7 C)-98.8 F (37.1 C)] 98.4 F (36.9 C) (09/29 0715) Pulse Rate:  [55-102] 101  (09/29 0900) Resp:  [13-21] 17  (09/29 0900) BP: (98-148)/(28-95) 121/41 mmHg (09/29 0900) SpO2:  [98 %-100 %] 100 % (09/29 0900) Weight:  [244 lb 14.9 oz (111.1 kg)] 244 lb 14.9 oz (111.1 kg) (09/29 0300) Last BM Date: 09/03/12 General:   Alert,  Well-developed,    in NAD Heart:  Regular rate and rhythm; no murmurs Pulm;claer Abdomen:  Soft, nontender and nondistended. Normal bowel sounds, without guarding   Extremities:  Without edema. Neurologic:  Alert and  oriented x4;  grossly normal neurologically. Psych:  Alert and cooperative. Normal mood and affect.  Intake/Output from previous day: 09/28 0701 - 09/29 0700 In: 2290 [P.O.:1140; I.V.:1150] Out: 550 [Urine:550] Intake/Output this shift: Total I/O In: 340 [P.O.:240; I.V.:100] Out: -   Lab Results:  Basename 09/05/12 0400 09/04/12 1500 09/04/12 0350 09/03/12 1354 09/03/12 1300 09/03/12 0415  WBC -- -- 10.6* -- 11.5* 12.3*  HGB 8.8* 8.6* 9.0* -- -- --  HCT 26.5* 26.1* 27.6* -- -- --  PLT -- -- 58* 51* 57* --   BMET  Basename 09/04/12 0350 09/03/12 1300 09/03/12 0415  NA 140 139 139  K 3.6 3.8 3.8  CL 105 105 106  CO2 31 31 30   GLUCOSE 138* 168* 166*  BUN 26* 33* 32*  CREATININE 0.93 0.90 0.88  CALCIUM 7.6* 7.6* 7.8*   LFT  Basename 09/04/12 0350  PROT 4.8*  ALBUMIN 2.4*  AST 20  ALT 10  ALKPHOS 47  BILITOT 0.5  BILIDIR 0.2  IBILI 0.3   PT/INR  Basename 09/04/12 0350 09/03/12 1354  LABPROT 17.7* 17.7*  INR 1.50* 1.50*    Assessment / Plan: #1  78 yo female with major Gi  bleed-recurrent -stable s/p EGD with endoclipping and injection of duodenal submucosal mass on 9/27. No active bleeding since. 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 She will need EUS scheduled-so can decide if this needs to be resected.  #2 cholelithiasis, choledocholithiasis- asymptomatic #3 anemia-post hemorrhagic- stable -aim to keep hgb close to 9 #4 debilitation- will ask PT to see  #5 hx breast cancer #6 coagulopathy-corrected-was on coumadin for atrial fib-would not restart until decide about resection of duodenal lesion #7 thrombocytopenia-per heme   ok to transfer out of unit from GI standpoint Active Problems:  Hemorrhage of gastrointestinal tract, unspecified  Warfarin-induced coagulopathy  Acute posthemorrhagic anemia  Calculus of bile duct without mention of cholecystitis or obstruction  Calculus of gallbladder without mention of cholecystitis or obstruction  Thrombocytopenia  Duodenal ulcer with hemorrhage     LOS: 6 days   Amy Esterwood  09/05/2012, 9:19 AM

## 2012-09-05 NOTE — Progress Notes (Signed)
I have personally taken an interval history, reviewed the chart, and examined the patient.  I agree with the extender's note, impression and recommendations.  

## 2012-09-06 ENCOUNTER — Encounter (HOSPITAL_COMMUNITY): Payer: Self-pay | Admitting: General Surgery

## 2012-09-06 ENCOUNTER — Encounter (HOSPITAL_COMMUNITY): Payer: Self-pay

## 2012-09-06 DIAGNOSIS — K319 Disease of stomach and duodenum, unspecified: Secondary | ICD-10-CM

## 2012-09-06 DIAGNOSIS — K922 Gastrointestinal hemorrhage, unspecified: Secondary | ICD-10-CM

## 2012-09-06 DIAGNOSIS — K3189 Other diseases of stomach and duodenum: Secondary | ICD-10-CM | POA: Diagnosis present

## 2012-09-06 LAB — BASIC METABOLIC PANEL
BUN: 12 mg/dL (ref 6–23)
Chloride: 103 mEq/L (ref 96–112)
GFR calc Af Amer: 70 mL/min — ABNORMAL LOW (ref >90)
Potassium: 3.3 mEq/L — ABNORMAL LOW (ref 3.5–5.1)

## 2012-09-06 LAB — CBC
HCT: 26.3 % — ABNORMAL LOW (ref 36.0–46.0)
MCH: 29.1 pg (ref 26.0–34.0)
MCHC: 32.9 g/dL (ref 30.0–36.0)
Platelets: 103 10*3/uL — ABNORMAL LOW (ref 150–400)
RBC: 2.98 MIL/uL — ABNORMAL LOW (ref 3.87–5.11)
RBC: 3.13 MIL/uL — ABNORMAL LOW (ref 3.87–5.11)
RDW: 17.3 % — ABNORMAL HIGH (ref 11.5–15.5)
WBC: 6.1 10*3/uL (ref 4.0–10.5)

## 2012-09-06 LAB — GLUCOSE, CAPILLARY
Glucose-Capillary: 219 mg/dL — ABNORMAL HIGH (ref 70–99)
Glucose-Capillary: 187 mg/dL — ABNORMAL HIGH (ref 70–99)
Glucose-Capillary: 181 mg/dL — ABNORMAL HIGH (ref 70–99)

## 2012-09-06 NOTE — Progress Notes (Addendum)
[facility] Daily Rounding Note 09/06/2012, 8:25 AM  SUBJECTIVE:       Not dizzy or weak.  Not SOB or having chest or abd pain.  No nausea.  Stools yesterday and this AM still black.  Tolerating solids  OBJECTIVE:         Vital signs in last 24 hours:    Temp:  [98.4 F (36.9 C)-99.7 F (37.6 C)] 99.1 F (37.3 C) (09/30 0517) Pulse Rate:  [70-110] 82  (09/30 0517) Resp:  [15-20] 18  (09/30 0517) BP: (116-146)/(41-82) 139/82 mmHg (09/30 0517) SpO2:  [94 %-100 %] 98 % (09/30 0517) Weight:  [253 lb 1.6 oz (114.805 kg)] 253 lb 1.6 oz (114.805 kg) (09/30 0517) Last BM Date: 09/05/12 General: elderly, NAD.  Not acutely ill looking   Heart: Irreg, irreg.  No MRG Chest: some fine crackles at bases B.  No cough or labored breathing.  Abdomen: soft, active BS, NT, obese  Extremities: 1 to 2 plus pedial, tibial edema Neuro/Psych:  Pleasant, not confused or disoriented.   Intake/Output from previous day: 09/29 0701 - 09/30 0700 In: 560 [P.O.:360; I.V.:200] Out: 300 [Urine:300]  Intake/Output this shift:    Lab Results:  Basename 09/06/12 0430 09/05/12 0400 09/04/12 1500 09/04/12 0350 09/03/12 1354 09/03/12 1300  WBC 6.1 -- -- 10.6* -- 11.5*  HGB 8.7* 8.8* 8.6* -- -- --  HCT 26.3* 26.5* 26.1* -- -- --  PLT 93* -- -- 58* 51* --   BMET  Basename 09/06/12 0430 09/04/12 0350 09/03/12 1300  NA 137 140 139  K 3.3* 3.6 3.8  CL 103 105 105  CO2 27 31 31   GLUCOSE 138* 138* 168*  BUN 12 26* 33*  CREATININE 0.86 0.93 0.90  CALCIUM 8.0* 7.6* 7.6*   LFT  Basename 09/04/12 0350  PROT 4.8*  ALBUMIN 2.4*  AST 20  ALT 10  ALKPHOS 47  BILITOT 0.5  BILIDIR 0.2  IBILI 0.3   PT/INR  Basename 09/04/12 0350 09/03/12 1354  LABPROT 17.7* 17.7*  INR 1.50* 1.50*    ASSESMENT: *  GI bleed.  EGD with endoclipping and injection of submucosal mass 9/27. ? Ulcerated leiomyoma?  No gross bleeding since.  *   Choledocholithiasis, cholelithiasis, no sxs *   Anemia, post hemorrhagic.  H  and H stable.  *  Atrial fib, chronic Coumadin on hold, INR corrected.  *  Thrombocytopenia, impoved, not resolved.    PLAN: *  ? EUS to eval the mass?   LOS: 7 days   Jennye Moccasin  09/06/2012, 8:25 AM Pager: [phone]   [facility] Attending  I have also seen and assessed the patient and agree with the above note. I have reviewed last 2 EGD reports and images and the CT. 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 Christella Hartigan. ? What, if any surgical options possible - seems like we would want to avoid that but may need their opinion as to what might be possible and would EUS make a difference to them I will also check about possibility of embolization therapy of the submucosal lesion At some point would try ERCP with either biliary stenting or sphincterotomy and stone extraction -  pending clinical course  I have explained this to patient and family.  Iva Boop, MD, [facility] [phone] (pager) 09/06/2012 2:49 PM   Discussed again with Dr. Christella Hartigan - we will ask for surgery consult just to know if surgical option is available. If we were to pursue embolization for destruction as opposed to control of hemorrhage - then would still need an EUS - but this assumes tumor destruction possible.  At any rate she needs to stay off anti-coagulation. Once we have a better idea on this will be able to better decide on disposition Will need to sort out timing of a prophylactic ERCP also.  Iva Boop, MD, [facility] [phone] (pager) 09/06/2012 4:20 PM

## 2012-09-06 NOTE — Progress Notes (Signed)
Pt  HR got up to 150 while ambulating to bedside commode but did not sustain, pt asymptomatic. HR back down in the 70's. Will continue to monitor.

## 2012-09-06 NOTE — Consult Note (Signed)
General surgery attending note:  I have interviewed and examined this patient this evening. I agree with the evaluation and treatment plan outlined by Barnetta Chapel, PA. I have discussed my recommendations with the patient and her family.  Clinically it appears that she has stopped bleeding at this time. She has no pain and is tolerating a regular diet. Stools are still dark.    EGD shows a small nodule in the second portion of the duodenum just proximal to the ampulla of Vater.    There was a ulcer at the apex of this nodule seen on the second endoscopy at which time a visible blood vessel was seen and injected.     Since that time she has stopped bleeding. Biopsies of this mass were not performed at the time of bleeding control, and I agree.  CT scan shows no obvious mass. It does show gallstones and a common bile duct stone which are nonobstructing.  She was on Coumadin and was taking NSAIDS  but that has now been withdrawn and her INR is normalized.  Exam: Patient alert. No distress. Skin warm and dry Abdomen soft. Nontender. No scars. No hernias.  Assessment: Small mucosal and submucosal duodenal nodule and second portion of duodenum just proximal to ampulla of Vater Significant upper GI bleed secondary to ulceration of this nodule. Question whether this might be a Gist tumor or other neoplastic process. Chronic atrial fibrillation on Coumadin, and a reverse Diastolic congestive heart failure Hypertension Non-insulin-dependent diabetes mellitus History DVT History DCIS of breast.  Recommendation: There is no urgent or emergent need to recommend surgical intervention. Recommend we  hold Coumadin and NSAIDS  Indefinitely.  If she rebleeds acutely would advise angioembolization  Eventually, But not urgently, I would recommend endoscopic ultrasound to see how big the mass truly is and how it relates to the common bile duct and pancreatic duct.  Hopefully a biopsy could be done at that  time. If it involves those  structures then  a Whipple procedure might possibly be contemplated, and that is a very complex decision in this elderly patient. If it is a small submucosal nodule that perhaps a limited enucleation procedure could be performed.  In any event, the primary goal of therapy in the short term is control of bleeding and allowing the ulcer to heal.  Decisions regarding whether she would benefit from surgical intervention will likely be delayed until further information is gained from the EUS.  Will follow   Angelia Mould. Derrell Lolling, M.D., [facility] General and Minimally invasive Surgery Breast and Colorectal Surgery Office:   [phone] Pager:   [phone]

## 2012-09-06 NOTE — Evaluation (Signed)
Physical Therapy Evaluation Patient Details Name: [name] MRN: [id number] DOB: [birth year] Today's Date: 09/06/2012 Time: 4696-2952 PT Time Calculation (min): 27 min  PT Assessment / Plan / Recommendation Clinical Impression  Pt admitted with GIB along with the below PT problem list.  Pt would benefit from acute PT to maximize independence and facilitate d/c home to daughter's with HHPT.    PT Assessment  Patient needs continued PT services    Follow Up Recommendations  Home health PT    Barriers to Discharge None      Equipment Recommendations  None recommended by PT    Recommendations for Other Services     Frequency Min 3X/week    Precautions / Restrictions Precautions Precautions: Fall Restrictions Weight Bearing Restrictions: No   Pertinent Vitals/Pain None      Mobility  Bed Mobility Bed Mobility: Supine to Sit Supine to Sit: 4: Min guard;HOB elevated (HOB 45 degrees.) Details for Bed Mobility Assistance: Guarding for balance and safety with cues for safest sequence. Transfers Transfers: Sit to Stand;Stand to Sit Sit to Stand: 4: Min assist;With upper extremity assist;From bed Stand to Sit: 4: Min assist;With upper extremity assist;To chair/3-in-1 Details for Transfer Assistance: Assist for balance and safety with cues for safest hand placement. Ambulation/Gait Ambulation/Gait Assistance: 4: Min assist Ambulation Distance (Feet): 40 Feet Assistive device: 1 person hand held assist Ambulation/Gait Assistance Details: Assist for balance with cues for tall posture and safety.   Gait Pattern: Step-through pattern;Decreased stride length;Trunk flexed Stairs: No Wheelchair Mobility Wheelchair Mobility: No    Shoulder Instructions     Exercises     PT Diagnosis: Difficulty walking;Generalized weakness  PT Problem List: Decreased strength;Decreased activity tolerance;Decreased balance;Decreased mobility;Decreased knowledge of use of DME PT Treatment  Interventions: DME instruction;Gait training;Stair training;Functional mobility training;Therapeutic activities;Balance training;Patient/family education   PT Goals Acute Rehab PT Goals PT Goal Formulation: With patient/family Time For Goal Achievement: 09/13/12 Potential to Achieve Goals: Good Pt will go Supine/Side to Sit: with modified independence PT Goal: Supine/Side to Sit - Progress: Goal set today Pt will go Sit to Supine/Side: with modified independence PT Goal: Sit to Supine/Side - Progress: Goal set today Pt will go Sit to Stand: with modified independence PT Goal: Sit to Stand - Progress: Goal set today Pt will go Stand to Sit: with modified independence PT Goal: Stand to Sit - Progress: Goal set today Pt will Ambulate: >150 feet;with modified independence;with least restrictive assistive device PT Goal: Ambulate - Progress: Goal set today Pt will Go Up / Down Stairs: Flight;with min assist;with least restrictive assistive device PT Goal: Up/Down Stairs - Progress: Goal set today  Visit Information  Last PT Received On: 09/06/12 Assistance Needed: +1    Subjective Data  Subjective: "I'm feeling ok." Patient Stated Goal: Go home.   Prior Functioning  Home Living Lives With: Alone (Will go stay at youngest daughter's house.) Available Help at Discharge: Family;Available 24 hours/day Type of Home: House Home Access: Ramped entrance Home Layout: Two level Alternate Level Stairs-Number of Steps: 14 Alternate Level Stairs-Rails: Left Home Adaptive Equipment: Straight cane;Walker - rolling Prior Function Level of Independence: Independent with assistive device(s) (Uses cane.) Able to Take Stairs?: Yes Driving: Yes Vocation: Retired Musician: No difficulties Dominant Hand: Right    Cognition  Overall Cognitive Status: Appears within functional limits for tasks assessed/performed Arousal/Alertness: Awake/alert Orientation Level: Appears intact  for tasks assessed Behavior During Session: [facility] for tasks performed    Extremity/Trunk Assessment Right Upper Extremity Assessment  RUE ROM/Strength/Tone: Within functional levels RUE Sensation: WFL - Light Touch RUE Coordination: WFL - gross motor Left Upper Extremity Assessment LUE ROM/Strength/Tone: Within functional levels LUE Sensation: WFL - Light Touch LUE Coordination: WFL - gross motor Right Lower Extremity Assessment RLE ROM/Strength/Tone: Within functional levels RLE Sensation: WFL - Light Touch RLE Coordination: WFL - gross motor Left Lower Extremity Assessment LLE ROM/Strength/Tone: Within functional levels LLE Sensation: WFL - Light Touch LLE Coordination: WFL - gross motor Trunk Assessment Trunk Assessment: Normal   Balance Balance Balance Assessed: No  End of Session PT - End of Session Equipment Utilized During Treatment: Gait belt Activity Tolerance: Patient tolerated treatment well Patient left: in chair;with call bell/phone within reach;with family/visitor present Nurse Communication: Mobility status  GP     Cephus Shelling 09/06/2012, 12:03 PM  09/06/2012 Cephus Shelling, PT, DPT [phone]

## 2012-09-06 NOTE — Progress Notes (Signed)
Subjective:  Patient denies any chest pain abdominal pain or shortness of breath. Hemoglobin remained stable  Objective:  Vital Signs in the last 24 hours: Temp:  [99 F (37.2 C)-99.7 F (37.6 C)] 99.1 F (37.3 C) (09/30 0517) Pulse Rate:  [79-84] 82  (09/30 0517) Resp:  [16-18] 18  (09/30 0517) BP: (116-146)/(49-82) 139/82 mmHg (09/30 0517) SpO2:  [97 %-98 %] 98 % (09/30 0517) Weight:  [114.805 kg (253 lb 1.6 oz)] 114.805 kg (253 lb 1.6 oz) (09/30 0517)  Intake/Output from previous day: 09/29 0701 - 09/30 0700 In: 560 [P.O.:360; I.V.:200] Out: 300 [Urine:300] Intake/Output from this shift: Total I/O In: 240 [P.O.:240] Out: -   Physical Exam: Neck: no adenopathy, no carotid bruit, no JVD and supple, symmetrical, trachea midline Lungs: clear to auscultation bilaterally Heart: irregularly irregular rhythm, S1, S2 normal and Soft systolic murmur noted Abdomen: soft, non-tender; bowel sounds normal; no masses,  no organomegaly Extremities: No clubbing cyanosis 1+ edema noted  Lab Results:  Basename 09/06/12 0430 09/05/12 0400 09/04/12 0350  WBC 6.1 -- 10.6*  HGB 8.7* 8.8* --  PLT 93* -- 58*    Basename 09/06/12 0430 09/04/12 0350  NA 137 140  K 3.3* 3.6  CL 103 105  CO2 27 31  GLUCOSE 138* 138*  BUN 12 26*  CREATININE 0.86 0.93   No results found for this basename: TROPONINI:2,CK,MB:2 in the last 72 hours Hepatic Function Panel  Basename 09/04/12 0350  PROT 4.8*  ALBUMIN 2.4*  AST 20  ALT 10  ALKPHOS 47  BILITOT 0.5  BILIDIR 0.2  IBILI 0.3   No results found for this basename: CHOL in the last 72 hours No results found for this basename: PROTIME in the last 72 hours  Imaging: Imaging results have been reviewed and No results found.  Cardiac Studies:  Assessment/Plan:  Status post Acute upper GI bleeding from submucosal nodule in second portion of the duodenum status post EGD/Endo Clip to control bleeding rule out gastrointestinal stromal tumor    Status post Coumadin toxicity  Status post Hypotensive shock secondary to #1  Hypertension  Non-insulin-dependent diabetes mellitus  Chronic atrial fibrillation  History of DVT  Degenerative joint disease  History of CA of breast  Acute anemia secondary to #1 stable Thrombocytopenia improved Plan Continue present management Advanced and aspirate GI Ambulate as tolerated Check CBC in a.m. Will DC home once okay from GI point of view  LOS: 7 days    [name] N 09/06/2012, 12:25 PM

## 2012-09-06 NOTE — Consult Note (Signed)
[name] [birth year]  [id number].   Requesting MD: Dr. Stan Head Chief Complaint/Reason for Consult: duodenal mass HPI: This is an 78 yo female who was at home and began having some dizziness and generalized weakness.  She had noticed that her stools were dark, but felt that may be related to the vitamins she was taking.  Upon arrival to the ED, she was noted to have a hgb of 5.6 and an INR of 5.  She was admitted.  GI was asked to see her for melanotic stools.  She had an EGD which revealed a submucosal lesion in the second portion of the duodenum with an ulcerated area that was bleeding.  This was injected and clipped.  Her hgb is currently stable.  She is tolerating a diet and has no other complaints.  We have been asked to see the patient to determine if anything surgical is needed.  Review of Systems: Please see HPI, otherwise negative.  History reviewed. No pertinent family history.  Past Medical History  Diagnosis Date  . 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 DVT (deep venous thrombosis)   . Peripheral neuropathy   . Arthritis   . Diabetes mellitus   . Cancer     DCIS s/p lumpectomy and radiation 2011  . Hyperlipidemia   . Hypertension   . CHF (congestive heart failure)     Past Surgical History  Procedure Date  . Breast surgery     2012  . Esophagogastroduodenoscopy 09/01/2012    Procedure: ESOPHAGOGASTRODUODENOSCOPY (EGD);  Surgeon: Meryl Dare, MD,FACG;  Location: [facility] ENDOSCOPY;  Service: Endoscopy;  Laterality: N/A;    Social History:  reports that she has never smoked. She has never used smokeless tobacco. She reports that she does not drink alcohol or use illicit drugs.  Allergies:  Allergies  Allergen Reactions  . Peach Flavor     "rash"    Medications Prior to Admission  Medication Sig Dispense Refill  . amLODipine-valsartan (EXFORGE) 5-320 MG per tablet Take 1 tablet by mouth daily.      Marland Kitchen aspirin 81 MG tablet Take 81 mg by  mouth daily.      Marland Kitchen atenolol (TENORMIN) 50 MG tablet Take 50 mg by mouth daily.      Marland Kitchen atorvastatin (LIPITOR) 20 MG tablet Take 20 mg by mouth daily.      . celecoxib (CELEBREX) 200 MG capsule Take 200 mg by mouth 2 (two) times daily.      . digoxin (LANOXIN) 0.125 MG tablet Take 125 mcg by mouth daily.      Marland Kitchen exemestane (AROMASIN) 25 MG tablet Take 25 mg by mouth daily after breakfast.      . furosemide (LASIX) 40 MG tablet Take 40 mg by mouth daily.      Marland Kitchen glimepiride (AMARYL) 2 MG tablet Take 2 mg by mouth daily before breakfast.      . potassium chloride SA (K-DUR,KLOR-CON) 20 MEQ tablet Take 20 mEq by mouth 2 (two) times daily.      Marland Kitchen warfarin (COUMADIN) 2 MG tablet Take 2-4 mg by mouth daily. Takes 4mg  (2 tablets) on Thurs and Saturday and all other days takes 2 mg        Blood pressure 114/59, pulse 74, temperature 98.7 F (37.1 C), temperature source Oral, resp. rate 19, height 5\' 3"  (1.6 m), weight 253 lb 1.6 oz (114.805 kg), SpO2 100.00%. Physical Exam: General: pleasant, obese black female who is sitting up in  a chair in NAD HEENT: head is normocephalic, atraumatic.  Sclera are noninjected.  PERRL.  Ears and nose without any masses or lesions.  Mouth is pink and moist Heart: irregular.  Normal s1,s2. No obvious gallops, or rubs noted. + murmur  Palpable radial and pedal pulses bilaterally Lungs: CTAB, no wheezes, rhonchi, or rales noted.  Respiratory effort nonlabored Abd: soft, NT, ND, +BS, no masses, hernias, or organomegaly MS: all 4 extremities are symmetrical with no cyanosis, clubbing, she does have some lower extremity edema Skin: warm and dry with no masses, lesions, or rashes Psych: A&Ox3 with an appropriate affect.    Results for orders placed during the hospital encounter of 08/30/12 (from the past 48 hour(s))  GLUCOSE, CAPILLARY     Status: Abnormal   Collection Time   09/04/12  9:12 PM      Component Value Range Comment   Glucose-Capillary 185 (*) 70 - 99 mg/dL     HEMOGLOBIN AND HEMATOCRIT, BLOOD     Status: Abnormal   Collection Time   09/05/12  4:00 AM      Component Value Range Comment   Hemoglobin 8.8 (*) 12.0 - 15.0 g/dL    HCT 46.9 (*) 62.9 - 46.0 %   GLUCOSE, CAPILLARY     Status: Abnormal   Collection Time   09/05/12  7:14 AM      Component Value Range Comment   Glucose-Capillary 128 (*) 70 - 99 mg/dL   GLUCOSE, CAPILLARY     Status: Abnormal   Collection Time   09/05/12 11:08 AM      Component Value Range Comment   Glucose-Capillary 148 (*) 70 - 99 mg/dL   GLUCOSE, CAPILLARY     Status: Abnormal   Collection Time   09/05/12  4:02 PM      Component Value Range Comment   Glucose-Capillary 179 (*) 70 - 99 mg/dL   GLUCOSE, CAPILLARY     Status: Abnormal   Collection Time   09/05/12  8:44 PM      Component Value Range Comment   Glucose-Capillary 181 (*) 70 - 99 mg/dL   CBC     Status: Abnormal   Collection Time   09/06/12  4:30 AM      Component Value Range Comment   WBC 6.1  4.0 - 10.5 K/uL    RBC 2.98 (*) 3.87 - 5.11 MIL/uL    Hemoglobin 8.7 (*) 12.0 - 15.0 g/dL    HCT 52.8 (*) 41.3 - 46.0 %    MCV 88.3  78.0 - 100.0 fL    MCH 29.2  26.0 - 34.0 pg    MCHC 33.1  30.0 - 36.0 g/dL    RDW 24.4 (*) 01.0 - 15.5 %    Platelets 93 (*) 150 - 400 K/uL CONSISTENT WITH PREVIOUS RESULT  BASIC METABOLIC PANEL     Status: Abnormal   Collection Time   09/06/12  4:30 AM      Component Value Range Comment   Sodium 137  135 - 145 mEq/L    Potassium 3.3 (*) 3.5 - 5.1 mEq/L    Chloride 103  96 - 112 mEq/L    CO2 27  19 - 32 mEq/L    Glucose, Bld 138 (*) 70 - 99 mg/dL    BUN 12  6 - 23 mg/dL    Creatinine, Ser 2.72  0.50 - 1.10 mg/dL    Calcium 8.0 (*) 8.4 - 10.5 mg/dL    GFR calc non  Af Amer 61 (*) >90 mL/min    GFR calc Af Amer 70 (*) >90 mL/min   GLUCOSE, CAPILLARY     Status: Abnormal   Collection Time   09/06/12  6:08 AM      Component Value Range Comment   Glucose-Capillary 134 (*) 70 - 99 mg/dL   GLUCOSE, CAPILLARY     Status:  Abnormal   Collection Time   09/06/12 11:24 AM      Component Value Range Comment   Glucose-Capillary 219 (*) 70 - 99 mg/dL    Comment 1 Documented in Chart      Comment 2 Notify RN     GLUCOSE, CAPILLARY     Status: Abnormal   Collection Time   09/06/12  4:23 PM      Component Value Range Comment   Glucose-Capillary 187 (*) 70 - 99 mg/dL    Comment 1 Notify RN      Comment 2 Documented in Chart      No results found.     Assessment/Plan 1. Duodenal mass  2. UGI bleed secondary to number 1 3. DM 4. HTN 5. CHF 6. A. Fib 7. Chronic anticoagulation 8. nonobstructing choledocholithiasis  Plan: 1. I have d/w Dr. Derrell Lolling.  The first step if the patient rebleeds would not necessarily be surgical.  Embolization could be attempted if needed.  An EUS may be helpful to determine true location of this mass, ie, if it extends towards the head of the pancreas, involved any major structures such as CBD, pancreatic duct, etc to determine if the patient did need surgery whether it would be just a bowel resection or whether she may require a whipple.  If the later was needed, this would be a big operation that I'm not sure the patient could tolerate at her age and other medical issues.  A tissue diagnosis would also be helpful.  Currently, the patient is stable and not bleeding.  Hopefully, this will continue and no further workup or treatment is needed.  Decision on whether to resume coumadin at all would be up to the patient and her cardiologist as obviously, the patient is at risk for recurrent bleeds with this ulcerated lesion on anticoagulation.  Thank you for this consult.  We will follow with you.  More recommendations after Dr. Jacinto Halim evaluation. Tarra Pence E 09/06/2012, 4:43 PM Pager: [phone]

## 2012-09-07 DIAGNOSIS — K8050 Calculus of bile duct without cholangitis or cholecystitis without obstruction: Secondary | ICD-10-CM

## 2012-09-07 DIAGNOSIS — K264 Chronic or unspecified duodenal ulcer with hemorrhage: Secondary | ICD-10-CM

## 2012-09-07 DIAGNOSIS — K319 Disease of stomach and duodenum, unspecified: Secondary | ICD-10-CM

## 2012-09-07 LAB — CBC
MCH: 28.9 pg (ref 26.0–34.0)
MCHC: 32.8 g/dL (ref 30.0–36.0)
MCV: 88 fL (ref 78.0–100.0)
Platelets: 106 10*3/uL — ABNORMAL LOW (ref 150–400)
RDW: 16.9 % — ABNORMAL HIGH (ref 11.5–15.5)

## 2012-09-07 LAB — GLUCOSE, CAPILLARY

## 2012-09-07 LAB — BASIC METABOLIC PANEL
Calcium: 8 mg/dL — ABNORMAL LOW (ref 8.4–10.5)
Creatinine, Ser: 0.84 mg/dL (ref 0.50–1.10)
GFR calc Af Amer: 72 mL/min — ABNORMAL LOW (ref >90)

## 2012-09-07 MED ORDER — PANTOPRAZOLE SODIUM 40 MG PO TBEC
40.0000 mg | DELAYED_RELEASE_TABLET | Freq: Two times a day (BID) | ORAL | Status: DC
  Administered 2012-09-07: 40 mg via ORAL
  Filled 2012-09-07: qty 1

## 2012-09-07 MED ORDER — PANTOPRAZOLE SODIUM 40 MG PO TBEC: 40.0000 mg | DELAYED_RELEASE_TABLET | Freq: Two times a day (BID) | ORAL | Status: AC

## 2012-09-07 NOTE — Progress Notes (Signed)
4 Days Post-Op  Subjective: Patient states she feels well and slept well during the night. She states that she has had no stools overnight. Denies abdominal pain.  Morning lab work pending. Hemoglobin stable yesterday morning and yesterday evening. Last hemoglobin 9.1 yesterday afternoon.  Objective: Vital signs in last 24 hours: Temp:  [98.7 F (37.1 C)] 98.7 F (37.1 C) (09/30 2127) Pulse Rate:  [74-80] 80  (09/30 2127) Resp:  [18-19] 18  (09/30 2127) BP: (114-154)/(59-60) 154/60 mmHg (09/30 2127) SpO2:  [100 %] 100 % (09/30 2127) Last BM Date: 09/06/12  Intake/Output from previous day: 09/30 0701 - 10/01 0700 In: 720 [P.O.:720] Out: -  Intake/Output this shift:    General appearance: alert. No distress. Mental status normal. Skin warm and dry. GI: Abdomen soft. Nontender. No scars. No hernias.  Lab Results:   [facility] 09/06/12 1638 09/06/12 0430  WBC 6.2 6.1  HGB 9.1* 8.7*  HCT 27.7* 26.3*  PLT 103* 93*   BMET  Basename 09/06/12 0430  NA 137  K 3.3*  CL 103  CO2 27  GLUCOSE 138*  BUN 12  CREATININE 0.86  CALCIUM 8.0*   PT/INR No results found for this basename: LABPROT:2,INR:2 in the last 72 hours ABG No results found for this basename: PHART:2,PCO2:2,PO2:2,HCO3:2 in the last 72 hours  Studies/Results: No results found.  Anti-infectives: Anti-infectives    None      Assessment/Plan: s/p Procedure(s): ESOPHAGOGASTRODUODENOSCOPY (EGD)   Small mucosal and submucosal duodenal nodule, described on the medial wall of the duodenum adjacent and just proximal to the ampulla of Vater with central ulceration resulting in upper GI bleed. 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 In the short-term recommend holding NSAID's and Coumadi, continue PPI's  to hopefully allow this to heal somewhat.  I agree with EUS and possible biopsy to define the nature of this mass and its size and its relationship to adjacent structures. This will better define surgical options, if  it comes to that.  If she rebleeds acutely, I would advise angioembolization.  Complex surgical decision making in this elderly patient.  Chronic atrial fibrillation on Coumadin, initially supra-therapeutic INR, now reversed Diastolic congestive heart failure Hypertension Non-insulin-dependent diabetes mellitus History DVT next 1 history DCIS breast.    LOS: 8 days    Aritha Huckeba M. Derrell Lolling, M.D., [facility] General and Minimally invasive Surgery Breast and Colorectal Surgery Office:   [phone] Pager:   [phone]  09/07/2012

## 2012-09-07 NOTE — Progress Notes (Signed)
Physical Therapy Treatment Patient Details Name: [name] MRN: [id number] DOB: [birth year] Today's Date: 09/07/2012 Time: 0820-0839 PT Time Calculation (min): 19 min  PT Assessment / Plan / Recommendation Comments on Treatment Session  pt rpesents with GIB.  pt encouraged to increase daily activity ambulating to bathroom instead of using 3-in-1 next to bed.  pt notes fatigued at end of ambulation.      Follow Up Recommendations  Home health PT    Barriers to Discharge        Equipment Recommendations  Rolling walker with 5" wheels    Recommendations for Other Services    Frequency Min 3X/week   Plan Discharge plan remains appropriate;Frequency remains appropriate    Precautions / Restrictions Precautions Precautions: Fall Restrictions Weight Bearing Restrictions: No   Pertinent Vitals/Pain Denies pain.      Mobility  Bed Mobility Bed Mobility: Supine to Sit;Sitting - Scoot to Edge of Bed Supine to Sit: 5: Supervision;With rails;HOB elevated Sitting - Scoot to Edge of Bed: 5: Supervision Details for Bed Mobility Assistance: demos good technique, begins sitting before PT can flatten bed.   Transfers Transfers: Sit to Stand;Stand to Sit Sit to Stand: 4: Min guard;With upper extremity assist;From bed Stand to Sit: 4: Min guard;With upper extremity assist;To chair/3-in-1;With armrests Details for Transfer Assistance: cues for UE use, getting closer to chair and controlling descent.   Ambulation/Gait Ambulation/Gait Assistance: 4: Min guard Ambulation Distance (Feet): 80 Feet Assistive device: Rolling walker Ambulation/Gait Assistance Details: cues to stay closer to RW, upright posture, encouragement.   Gait Pattern: Step-through pattern;Decreased stride length;Trunk flexed Stairs: No Wheelchair Mobility Wheelchair Mobility: No    Exercises     PT Diagnosis:    PT Problem List:   PT Treatment Interventions:     PT Goals Acute Rehab PT Goals Time For Goal  Achievement: 09/13/12 PT Goal: Supine/Side to Sit - Progress: Progressing toward goal PT Goal: Sit to Stand - Progress: Progressing toward goal PT Goal: Stand to Sit - Progress: Progressing toward goal PT Goal: Ambulate - Progress: Progressing toward goal  Visit Information  Last PT Received On: 09/07/12 Assistance Needed: +1    Subjective Data  Subjective: I'm ok this morning.     Cognition  Overall Cognitive Status: Appears within functional limits for tasks assessed/performed Arousal/Alertness: Awake/alert Orientation Level: Appears intact for tasks assessed Behavior During Session: [facility] for tasks performed    Balance  Balance Balance Assessed: No  End of Session PT - End of Session Equipment Utilized During Treatment: Gait belt Activity Tolerance: Patient tolerated treatment well Patient left: in chair;with call bell/phone within reach Nurse Communication: Mobility status   GP     [name], [place] [phone] 09/07/2012, 8:59 AM

## 2012-09-07 NOTE — Progress Notes (Signed)
     [name] Daily Rounding Note 09/07/2012, 9:35 AM  SUBJECTIVE:       Feels well. No dark stools.  No nausea on solid diet  OBJECTIVE:         Vital signs in last 24 hours:    Temp:  [98.7 F (37.1 C)-98.8 F (37.1 C)] 98.8 F (37.1 C) (10/01 0639) Pulse Rate:  [74-91] 91  (10/01 0639) Resp:  [18-19] 18  (10/01 0639) BP: (114-154)/(59-76) 144/76 mmHg (10/01 0639) SpO2:  [100 %] 100 % (10/01 0639) Last BM Date: 09/06/12 General: looks old and chronically ill but not acutely ill   Heart: RRR Chest: clear Abdomen: soft, active BS  Not tender  Extremities: no pedal edema Neuro/Psych:  Pleasant, not confused or anxious.    Lab Results:  Basename 09/07/12 0504 09/06/12 1638 09/06/12 0430  WBC 6.1 6.2 6.1  HGB 8.9* 9.1* 8.7*  HCT 27.1* 27.7* 26.3*  PLT 106* 103* 93*   BMET  Basename 09/07/12 0504 09/06/12 0430  NA 140 137  K 3.2* 3.3*  CL 105 103  CO2 29 27  GLUCOSE 148* 138*  BUN 7 12  CREATININE 0.84 0.86  CALCIUM 8.0* 8.0*    ASSESMENT: * Dominguez bleed. EGD with endoclipping and injection of submucosal mass 9/27. ? Ulcerated leiomyoma? No gross bleeding since.  Dr Derrell Lolling recs:   Embolization if she rebleeds * Choledocholithiasis, cholelithiasis, no sxs.  Dr Derrell Lolling recs:  Non-urgent EUS, hopefully with biopsy for best assessment of the mass. * Anemia, post hemorrhagic. H and H stable.  * Atrial fib, chronic Coumadin on hold, INR corrected.  * Thrombocytopenia, impoved, not resolved.     PLAN: *  Has 10/7 appt with Mike Gip, PA-C at [facility] office, Dr  Christella Hartigan is supervising MD that day.  From there can get EUS/ERCP arranged. *  Home on BID oral protonix.    LOS: 8 days   [name]  09/07/2012, 9:35 AM Pager: [phone]   [place] Dominguez Attending  I have also seen and assessed the patient and agree with the above note. Bleeding has stopped. Will be seen in our office to coordinate next steps in diagnostic evaluation of the submucosal duodenal mass and  prophylactic treatment of choledocholithiasis.  Iva Boop, MD, [facility] [phone] (pager) 09/07/2012 2:32 PM

## 2012-09-07 NOTE — Progress Notes (Signed)
Patient discharge instructions reviewed with family present, all questions answered, prescriptions given. Dr. Elberta Leatherwood in during discharge and also answered questions. Home health set up as well as rolling walker sent home with patient.  Patient instructed to call office if blood noticed in stool, has increased fatique, SOB, or rapid heartbeat.  Patient also instructed on what meds to continue as well as what meds to stop. Patient discharged home.

## 2012-09-07 NOTE — Progress Notes (Signed)
Ok, i will schedule that way when I see her next week

## 2012-09-07 NOTE — Discharge Summary (Signed)
  Discharge summary dictated on 09/07/2012 dictation [phone]

## 2012-09-08 NOTE — Discharge Summary (Signed)
[name], [name]  MEDICAL RECORD NO.:  [id number]  LOCATION:  2037                         FACILITY:  [facility]  PHYSICIAN:  Eduardo Osier. Sharyn Lull, M.D. DATE OF BIRTH:  [birth year]  DATE OF ADMISSION:  08/30/2012 DATE OF DISCHARGE:  09/07/2012                              DISCHARGE SUMMARY   ADMITTING DIAGNOSES: 1. Acute upper gastrointestinal bleeding. 2. Coumadin toxicity. 3. Hypotensive shock secondary to #1. 4. Acute anemia secondary to #1. 5. Hypertension. 6. Non-insulin-dependent diabetes mellitus. 7. Chronic atrial fibrillation. 8. History of deep venous thrombosis. 9. Degenerative joint disease. 10.History of cancer of breast.  DISCHARGE DIAGNOSES: 1. Status post acute upper gastrointestinal bleeding secondary to     submucosal nodule in the 2nd portion of the duodenum status post     EGD x2, status post Endoclip to control the bleeding x2, rule out     gastrointestinal stromal tumor. 2. Status post Coumadin toxicity. 3. Status post hypotensive shock. 4. Hypertension. 5. Non-insulin-dependent diabetes mellitus. 6. Chronic atrial fibrillation. 7. History of deep venous thrombosis. 8. History of cancer of the breast in the past. 9. Degenerative joint disease. 10.Acute on chronic anemia, stable. 11.Thrombocytopenia, improved.  DISCHARGE HOME MEDICATIONS: 1. Amlodipine/valsartan 5/320 one tablet daily. 2. Atenolol 50 mg 1 tablet daily. 3. Atorvastatin 20 mg 1 tablet daily. 4. Digoxin 0.125 mg 1 tablet daily. 5. Lasix 40 mg 1 tablet daily. 6. Potassium chloride 20 mEq twice daily. 7. Amaryl 2 mg 1 tablet daily.  The patient has been advised to stop aspirin, Celebrex, warfarin, Aromasin.  Also the patient has been advised not to take any nonsteroidal anti-inflammatory medications.  DIET:  Low salt, low cholesterol, 1800 calories ADA diet.  DISCHARGE INSTRUCTIONS:  The patient has been advised to monitor blood pressure and blood  sugar daily.  The patient has been advised if she notices any black tarry stool or feels dizzy or weak, should call 911 and come to the hospital.  FOLLOWUP:  Follow up with me in 1 week.  The patient will be scheduled to see GI in 1-2 weeks, and Surgery as needed.  CONDITION AT DISCHARGE:  Stable.  BRIEF HISTORY AND HOSPITAL COURSE:  [name] is an 78 year old female with past medical history significant for hypertension, non- insulin-dependent diabetes mellitus, chronic atrial fibrillation, history of DVT, peripheral neuropathy, degenerative joint disease, history of CA of breast, history of congestive heart failure secondary to diastolic dysfunction.  She came to the ER complaining of progressive generalized weakness associated with shortness of breath and black tarry stools since yesterday, and was noted to have hemoglobin of 5.6, and also INR of 5.6.  The patient was noted to be hypotensive and received fluid challenge, and multiple packed RBCs and FFPs and vitamin K.  The patient denies any chest pain, nausea, vomiting, diaphoresis.  Denies any abdominal pain.  The patient was seen in the ER 2 days prior to this episode because of hypoglycemic episode and was noted to have an INR of 4.9.  The patient denies any palpitation, lightheadedness, syncope. Denies cough, fever.  Denies any extra dose of Coumadin recently. Denies any antibiotics use recently.  PAST MEDICAL HISTORY:  As above.  PHYSICAL EXAMINATION:  VITAL SIGNS:  Her blood pressure was 72/50, pulse was 77 irregularly irregular.  She was afebrile. EYES:  Conjunctivae was tailor.  Sclerae was nonicteric. NECK:  Supple.  No JVD.  No bruit. HEART:  Heart sounds irregularly irregular.  S1, S2 was normal.  There was 2/6 systolic murmur.  No S3 gallop. LUNGS:  Clear to auscultation without rhonchi or rales. ABDOMEN:  Soft.  It was mildly distended.  There was no tenderness or guarding. EXTREMITIES:  There is no  clubbing, cyanosis.  There was 1+ edema.  LABORATORY DATA:  Her hemoglobin was 5.6, hematocrit 17.8.  Two days before this admission, hemoglobin was 9.6, hematocrit 30.4.  Sodium was 138, potassium 5.6, BUN 36, creatinine 1.20.  Repeat hemoglobin on September 23rd was 9.1, hematocrit 27.4.  On September 24th, repeat hemoglobin was 6.8, hematocrit 20.7.  On September 25th, hemoglobin was 7.7, hematocrit 23.2.  On September 27th again hemoglobin was 6.7, hematocrit 20.7.  On September 28th, hemoglobin again dropped from 8.1 to 7.1, hematocrit 21.6.  On September 29th, hemoglobin 8.8, hematocrit 26.5.  Today, hemoglobin is 8.9 and hematocrit 27.1, which has been stable for last 2 days.  Her sodium is today 140, potassium 3,.2 which is being replaced.  BUN 7, creatinine 0.84, platelet count also dropped down to 58,000, which has been gradually improved from 93,000 to 103,000, today it is 106,000.  Her INR was 5.6, which has not normalized now.  Last INR was 1.5 on September 28th.  BRIEF HOSPITAL COURSE:  The patient was admitted to CCU.  The patient received multiple packed RBCs transfusion along with FFPs and vitamin K. GI consultation was obtained.  The patient subsequently underwent upper endoscopy and was noted to have submucosal nodule in the second portion of the duodenum.  Underwent EGD/Endoclip to control the bleeding on September 25th.  The patient continued to have significant drop in hemoglobin requiring total of 10 units of packed RBCs.  The patient subsequently underwent repeat upper endoscopy on September 28 and was noted to have bleeding at the ulcer site of the submucosal duodenal nodule in the 2nd portion of the duodenum with visible vessel and vessel was successfully treated with 2 Endoclips and epinephrine injection with good hemostasis.  The patient did not have any further episodes of hemoglobin drop.  For the last 2 days, her hemoglobin is stable.  Her diet was  advanced and tolerating solid diet well.  The patient will be evaluated further by GI as an outpatient for possible endovascular ultrasound to rule out gastrointestinal stromal tumor.  Surgical consultation was also obtained.  The patient will be followed up as an outpatient.  CT of the abdomen was inconclusive for the mass, so the patient will require endovascular ultrasound to further delineate the mass and possible biopsy as an outpatient.  The patient will be discharged home on above medications and will be followed up in my office in 1 week and GI and Surgery in 1-2 weeks as an outpatient.  We will hold off the Coumadin at this point. We will restart Coumadin as an outpatient once it is okay from GI point of view.     Eduardo Osier. Sharyn Lull, M.D.     [facility]/MEDQ  D:  09/07/2012  T:  09/08/2012  Job:  [id number]

## 2012-09-13 ENCOUNTER — Ambulatory Visit (INDEPENDENT_AMBULATORY_CARE_PROVIDER_SITE_OTHER): Payer: Medicare Other | Admitting: Physician Assistant

## 2012-09-13 ENCOUNTER — Other Ambulatory Visit: Payer: Medicare Other

## 2012-09-13 ENCOUNTER — Other Ambulatory Visit (INDEPENDENT_AMBULATORY_CARE_PROVIDER_SITE_OTHER): Payer: Medicare Other

## 2012-09-13 ENCOUNTER — Encounter: Payer: Self-pay | Admitting: Physician Assistant

## 2012-09-13 ENCOUNTER — Encounter: Payer: Self-pay | Admitting: Gastroenterology

## 2012-09-13 VITALS — BP 120/62 | HR 68 | Ht 63.0 in | Wt 239.8 lb

## 2012-09-13 DIAGNOSIS — K831 Obstruction of bile duct: Secondary | ICD-10-CM

## 2012-09-13 DIAGNOSIS — K3189 Other diseases of stomach and duodenum: Secondary | ICD-10-CM

## 2012-09-13 DIAGNOSIS — K8050 Calculus of bile duct without cholangitis or cholecystitis without obstruction: Secondary | ICD-10-CM

## 2012-09-13 DIAGNOSIS — D649 Anemia, unspecified: Secondary | ICD-10-CM

## 2012-09-13 DIAGNOSIS — K319 Disease of stomach and duodenum, unspecified: Secondary | ICD-10-CM

## 2012-09-13 DIAGNOSIS — Z8719 Personal history of other diseases of the digestive system: Secondary | ICD-10-CM

## 2012-09-13 LAB — COMPREHENSIVE METABOLIC PANEL
ALT: 17 U/L (ref 0–35)
CO2: 28 mEq/L (ref 19–32)
GFR: 50.21 mL/min — ABNORMAL LOW (ref >60.00)
Sodium: 135 mEq/L (ref 135–145)
Total Bilirubin: 0.5 mg/dL (ref 0.3–1.2)
Total Protein: 6.8 g/dL (ref 6.0–8.3)

## 2012-09-13 LAB — CBC WITH DIFFERENTIAL/PLATELET
Basophils Absolute: 0 10*3/uL (ref 0.0–0.1)
Eosinophils Absolute: 0.2 10*3/uL (ref 0.0–0.7)
HCT: 32.7 % — ABNORMAL LOW (ref 36.0–46.0)
Lymphs Abs: 1.9 10*3/uL (ref 0.7–4.0)
MCV: 91.8 fl (ref 78.0–100.0)
Monocytes Absolute: 1 10*3/uL (ref 0.1–1.0)
Monocytes Relative: 9.5 % (ref 3.0–12.0)
Platelets: 244 10*3/uL (ref 150.0–400.0)
RDW: 17.6 % — ABNORMAL HIGH (ref 11.5–14.6)

## 2012-09-13 NOTE — Patient Instructions (Addendum)
You have been scheduled for an ERCP/EUS with propofol at [facility]. Please follow written instructions given to you at your visit today. If you use inhalers (even only as needed), please bring them with you on the day of your procedure.  Your physician has requested that you go to the basement for lab work before leaving today: .   Please stop your iron  Please stay on Protonix twice daily

## 2012-09-13 NOTE — Progress Notes (Signed)
I agree with plan for same time EUS, ERCP.

## 2012-09-13 NOTE — Progress Notes (Signed)
Subjective:    Patient ID: [name], female    DOB: March 28, 1926, 78 y.o.   MRN: [id number]  HPI Kortni is an 78 year old African American female who was seen recently during hospitalization with an acute major upper GI bleed. Her bleeding occurred in the setting of over anticoagulation with Coumadin. She was found on endoscopy to have a duodenal mass measuring 1.5-2 cm. She had 2 endoscopies during her admission and on the second had injection and Endo Clip name of a 5 mm ulcer with visible vessel at the apex of this mass. Her anticoagulation was reversed and she has been left off of Coumadin for the time being. Her hemoglobin on discharge from the hospital 09/07/2012 was 8.9 She also had a thrombocytopenia with platelet count of 106. Patient was also found during the same admission to have cholelithiasis and choledocholithiasis both of which have been asymptomatic. Last liver function studies 08/30/2012 within normal limits. She comes in today for a post hospital followup, says she has been feeling well. She has been staying with her family but would like to return to her own apartment. She has no complaints of abdominal pain or discomfort no nausea and has been eating well. She has been watching her bowel movements and has not noted any melena or hematochezia . She did see Dr. Sharyn Lull last week and was started on iron supplement. She was seen by surgery/Dr. Derrell Lolling during this last admission, and plans were made for the patient to have endoscopic ultrasound and ERCP when she recuperated from her acute bleed. Surgical decisions will be based on findings at EUS and biopsy.    Review of Systems  Constitutional: Positive for fatigue.  HENT: Negative.   Eyes: Negative.   Respiratory: Negative.   Cardiovascular: Negative.   Gastrointestinal: Negative.   Genitourinary: Negative.   Musculoskeletal: Positive for arthralgias.  Skin: Negative.   Neurological: Negative.   Hematological: Negative.    Psychiatric/Behavioral: Negative.    Outpatient Prescriptions Prior to Visit  Medication Sig Dispense Refill  . amLODipine-valsartan (EXFORGE) 5-320 MG per tablet Take 1 tablet by mouth daily.      Marland Kitchen atenolol (TENORMIN) 50 MG tablet Take 50 mg by mouth daily.      Marland Kitchen atorvastatin (LIPITOR) 20 MG tablet Take 20 mg by mouth daily.      . digoxin (LANOXIN) 0.125 MG tablet Take 125 mcg by mouth daily.      . furosemide (LASIX) 40 MG tablet Take 40 mg by mouth daily.      Marland Kitchen glimepiride (AMARYL) 2 MG tablet Take 2 mg by mouth daily before breakfast.      . pantoprazole (PROTONIX) 40 MG tablet Take 1 tablet (40 mg total) by mouth 2 (two) times daily before a meal.  60 tablet  3  . potassium chloride SA (K-DUR,KLOR-CON) 20 MEQ tablet Take 20 mEq by mouth 2 (two) times daily.       Allergies  Allergen Reactions  . Peach (Prunus Persica) Rash       Patient Active Problem List  Diagnosis  . HYPERCHOLESTEROLEMIA  . HYPERTENSION  . ATRIAL FIBRILLATION, CHRONIC  . HEART MURMUR, HX OF  . DCIS (ductal carcinoma in situ) of breast  . Hemorrhage of gastrointestinal tract, unspecified  . Warfarin-induced coagulopathy  . Acute posthemorrhagic anemia  . Calculus of bile duct without mention of cholecystitis or obstruction  . Calculus of gallbladder without mention of cholecystitis or obstruction  . Thrombocytopenia  . Duodenal ulcer with hemorrhage  .  Duodenal submucosal mass   History   Social History  . Marital Status: Widowed    Spouse Name: N/A    Number of Children: 4  . Years of Education: N/A   Occupational History  . RETIRED    Social History Main Topics  . Smoking status: Never Smoker   . Smokeless tobacco: Never Used  . Alcohol Use: No  . Drug Use: No  . Sexually Active: Not Currently   Other Topics Concern  . Not on file   Social History Narrative  . No narrative on file    Objective:   Physical Exam  well-developed elderly AA female in no acute distress,  pleasant, accompanied by 2 family members pressure 120/62 pulse 68 height 5 foot 3 weight 239. HEENT; nontraumatic normocephalic EOMI PERRLA sclera anicteric,Neck; Supple no JVD, Cardiovascular; regular rate and rhythm with S1-S2 soft systolic murmur, Pulmonary; clear bilaterally, Abdomen; large soft nontender nondistended bowel sounds are active there is no palpable mass or hepatosplenomegaly, Rectal; exam ; not done. Common Extremities no clubbing cyanosis or edema skin warm and dry, Psych;mood and affect normal and appropriate.        Assessment & Plan:  #1  78yo old female  status post acute major upper GI bleed secondary to duodenal submucosal mass with ulceration, initial bleed occurring in the setting of supratherapeutic INR. Patient had 2 endoscopies during her admission, and required epinephrine injection and Endoclipping. Coumadin is on hold indefinitely Patient has had no further evidence of bleeding since discharge from the hospital #2 anemia posthemorrhagic #3 history of atrial fibrillation-for which she was on Coumadin #4 cholelithiasis #5 choledocholithiasis; asymptomatic with normal LFTs #6 thrombocytopenia- etiology not clear  Plan; Will check CBC and CMET today Hold oral iron for now to eliminate any confusion about melena. Recent iron studies review and she is not really iron deficient Schedule for endoscopic ultrasound and ERCP/stone extraction with Dr. Wendall Papa on November 7 at [facility] . Both procedures were discussed in detail with the patient and her family and they're agreeable to proceed. Referral back to Dr. Darrol Jump after above procedures have been completed.

## 2012-09-15 ENCOUNTER — Encounter: Payer: Self-pay | Admitting: *Deleted

## 2012-09-29 ENCOUNTER — Telehealth: Payer: Self-pay | Admitting: Gastroenterology

## 2012-09-29 NOTE — Telephone Encounter (Signed)
[name] was notified that Dr Christella Hartigan is not the pt's PCP and she did tell me that she has already gotten the orders from the correct Dr

## 2012-10-14 ENCOUNTER — Encounter (HOSPITAL_COMMUNITY): Payer: Self-pay | Admitting: *Deleted

## 2012-10-14 ENCOUNTER — Ambulatory Visit (HOSPITAL_COMMUNITY): Payer: Medicare Other

## 2012-10-14 ENCOUNTER — Ambulatory Visit (HOSPITAL_COMMUNITY): Payer: Medicare Other | Admitting: Anesthesiology

## 2012-10-14 ENCOUNTER — Ambulatory Visit (HOSPITAL_COMMUNITY)
Admission: RE | Admit: 2012-10-14 | Discharge: 2012-10-14 | Disposition: A | Discharge status: Home / Self Care | Payer: Medicare Other | Source: Ambulatory Visit | Attending: Gastroenterology | Admitting: Gastroenterology

## 2012-10-14 ENCOUNTER — Encounter (HOSPITAL_COMMUNITY)
Admission: RE | Disposition: A | Discharge status: Home / Self Care | Payer: Self-pay | Source: Ambulatory Visit | Attending: Gastroenterology

## 2012-10-14 ENCOUNTER — Encounter (HOSPITAL_COMMUNITY): Payer: Self-pay | Admitting: Anesthesiology

## 2012-10-14 DIAGNOSIS — E785 Hyperlipidemia, unspecified: Secondary | ICD-10-CM | POA: Insufficient documentation

## 2012-10-14 DIAGNOSIS — K3189 Other diseases of stomach and duodenum: Secondary | ICD-10-CM

## 2012-10-14 DIAGNOSIS — E119 Type 2 diabetes mellitus without complications: Secondary | ICD-10-CM | POA: Insufficient documentation

## 2012-10-14 DIAGNOSIS — Z853 Personal history of malignant neoplasm of breast: Secondary | ICD-10-CM | POA: Insufficient documentation

## 2012-10-14 DIAGNOSIS — K2980 Duodenitis without bleeding: Secondary | ICD-10-CM | POA: Insufficient documentation

## 2012-10-14 DIAGNOSIS — I10 Essential (primary) hypertension: Secondary | ICD-10-CM | POA: Insufficient documentation

## 2012-10-14 DIAGNOSIS — Z86718 Personal history of other venous thrombosis and embolism: Secondary | ICD-10-CM | POA: Insufficient documentation

## 2012-10-14 DIAGNOSIS — K319 Disease of stomach and duodenum, unspecified: Secondary | ICD-10-CM

## 2012-10-14 DIAGNOSIS — K8020 Calculus of gallbladder without cholecystitis without obstruction: Secondary | ICD-10-CM | POA: Insufficient documentation

## 2012-10-14 DIAGNOSIS — K8050 Calculus of bile duct without cholangitis or cholecystitis without obstruction: Secondary | ICD-10-CM | POA: Diagnosis present

## 2012-10-14 DIAGNOSIS — K831 Obstruction of bile duct: Secondary | ICD-10-CM

## 2012-10-14 HISTORY — PX: ENDOSCOPIC RETROGRADE CHOLANGIOPANCREATOGRAPHY (ERCP) WITH PROPOFOL: SHX5810

## 2012-10-14 HISTORY — PX: EUS: SHX5427

## 2012-10-14 SURGERY — ENDOSCOPIC RETROGRADE CHOLANGIOPANCREATOGRAPHY (ERCP) WITH PROPOFOL
Anesthesia: General

## 2012-10-14 MED ORDER — ONDANSETRON HCL 4 MG/2ML IJ SOLN
INTRAMUSCULAR | Status: DC | PRN
  Administered 2012-10-14: 4 mg via INTRAVENOUS

## 2012-10-14 MED ORDER — PROPOFOL 10 MG/ML IV BOLUS
INTRAVENOUS | Status: DC | PRN
  Administered 2012-10-14: 160 mg via INTRAVENOUS

## 2012-10-14 MED ORDER — MIDAZOLAM HCL 5 MG/5ML IJ SOLN
INTRAMUSCULAR | Status: DC | PRN
  Administered 2012-10-14: 2 mg via INTRAVENOUS

## 2012-10-14 MED ORDER — FENTANYL CITRATE 0.05 MG/ML IJ SOLN
INTRAMUSCULAR | Status: DC | PRN
  Administered 2012-10-14 (×2): 50 ug via INTRAVENOUS

## 2012-10-14 MED ORDER — LACTATED RINGERS IV SOLN
INTRAVENOUS | Status: DC
  Administered 2012-10-14: 1000 mL via INTRAVENOUS

## 2012-10-14 MED ORDER — SODIUM CHLORIDE 0.9 % IV SOLN: INTRAVENOUS | Status: DC

## 2012-10-14 MED ORDER — CIPROFLOXACIN IN D5W 400 MG/200ML IV SOLN
400.0000 mg | Freq: Once | INTRAVENOUS | Status: AC
  Administered 2012-10-14: 400 mg via INTRAVENOUS

## 2012-10-14 MED ORDER — CIPROFLOXACIN IN D5W 400 MG/200ML IV SOLN
INTRAVENOUS | Status: AC
  Filled 2012-10-14: qty 200

## 2012-10-14 MED ORDER — ROCURONIUM BROMIDE 100 MG/10ML IV SOLN
INTRAVENOUS | Status: DC | PRN
  Administered 2012-10-14: 40 mg via INTRAVENOUS

## 2012-10-14 MED ORDER — LIDOCAINE HCL (CARDIAC) 20 MG/ML IV SOLN
INTRAVENOUS | Status: DC | PRN
  Administered 2012-10-14: 50 mg via INTRAVENOUS

## 2012-10-14 MED ORDER — GLYCOPYRROLATE 0.2 MG/ML IJ SOLN
INTRAMUSCULAR | Status: DC | PRN
  Administered 2012-10-14: 0.6 mg via INTRAVENOUS

## 2012-10-14 MED ORDER — NEOSTIGMINE METHYLSULFATE 1 MG/ML IJ SOLN
INTRAMUSCULAR | Status: DC | PRN
  Administered 2012-10-14: 4 mg via INTRAVENOUS

## 2012-10-14 NOTE — Op Note (Signed)
[facility] [address] [place], [address]   ENDOSCOPIC ULTRASOUND PROCEDURE REPORT  PATIENT: [name], [name]  MR#: [id number] BIRTHDATE: 03-Mar-1926  GENDER: Female ENDOSCOPIST: Rachael Fee, MD REFERRED BY:  Meryl Dare, M.D, [facility] PROCEDURE DATE:  10/14/2012 PROCEDURE:   Upper EUS ASA CLASS:      Class IV INDICATIONS:   recent UGI bleeding from duodenal mass, was on coumadin (bleeding site treated with endoclip twice (Drs. Russella Dar and Arlyce Dice); since then coumadin held, no more overt bleeding; also found to have stones in GB and CBD on recent CT scan; normal LFTs. MEDICATIONS: MAC sedation, administered by CRNA  DESCRIPTION OF PROCEDURE:   After the risks benefits and alternatives of the procedure were  explained, informed consent was obtained. The patient was then placed in the left, lateral, decubitus postion and IV sedation was administered. Throughout the procedure, the patients blood pressure, pulse and oxygen saturations were monitored continuously.  Under direct visualization, the Pentax EUS Radial [id number] and EUS scope [id number] endoscope was introduced through the mouth  and advanced to the second portion of the duodenum .  Water was used as necessary to provide an acoustic interface.  Upon completion of the imaging, water was removed and the patient was sent to the recovery room in satisfactory condition.   Endoscopic findings: Limited views with radial echoendoscope only 1. Normal esophagus 2. Normal stomach 3. Mucosal mass in second duodenum with denuded mucosa. This was 1-2cm across by endoscopy  EUS findings: 1. The mass described above was a 1cm by 0.8cm hypoechoic lesion that clearly communicated with muscularis propria layer of duodenal wall. The outer walls of the mass were irregular.  The mass lays close to the main pancreatic duct, but does not obstruct it. 2. CBD was non-dilated (5mm) and there were no clear CBD stones. 3.  Gallbladder contained at least one irregularly shaped, hyperechoic, shadowing stone. 4. Pancreatic parenchyma was somewhat lobular, with hyperechoic strands and foci throughout.  No discrete pancreatic masses. 5. Main pancreatic duct was normal, non-dilated  Impression: 1cm by 0.8cm mass in wall of second dudoenum that clearly communicates with the muscularis propria layer of duodenum. The mucosa at this site was denuded, irregular appearing suggesting invasion/involvment of the mass with mucosa. CBD contained no obvious stones but given the CT scan findings previously I am going to proceed with planned ERCP. Will perform mucosal biopsies of the lesion, I suspect  these will have higher yield than FNA. Gallstones +.   _______________________________ eSignedRachael Fee, MD 10/14/2012 11:18 AM   PATIENT NAME:  [name], [name] MR#: [id number]

## 2012-10-14 NOTE — Anesthesia Preprocedure Evaluation (Addendum)
Anesthesia Evaluation  Patient identified by MRN, date of birth, ID band Patient awake    Reviewed: Allergy & Precautions, H&P , NPO status , Patient's Chart, lab work & pertinent test results  Airway Mallampati: II TM Distance: >3 FB Neck ROM: Full    Dental No notable dental hx.    Pulmonary neg pulmonary ROS,  breath sounds clear to auscultation  Pulmonary exam normal       Cardiovascular hypertension, Pt. on home beta blockers and Pt. on medications +CHF + dysrhythmias Atrial Fibrillation Rhythm:Regular Rate:Normal + Systolic murmurs H/O thrombocytopenia. Most recent 244K  ECHO in Feb 2013 showed normal EF   Neuro/Psych  Neuromuscular disease negative psych ROS   GI/Hepatic Neg liver ROS, PUD,   Endo/Other  diabetes, Type 2, Oral Hypoglycemic AgentsMorbid obesity  Renal/GU negative Renal ROS  negative genitourinary   Musculoskeletal negative musculoskeletal ROS (+)   Abdominal   Peds negative pediatric ROS (+)  Hematology negative hematology ROS (+)   Anesthesia Other Findings   Reproductive/Obstetrics negative OB ROS                         Anesthesia Physical Anesthesia Plan  ASA: III  Anesthesia Plan: General   Post-op Pain Management:    Induction: Intravenous  Airway Management Planned: Oral ETT  Additional Equipment:   Intra-op Plan:   Post-operative Plan: Extubation in OR  Informed Consent: I have reviewed the patients History and Physical, chart, labs and discussed the procedure including the risks, benefits and alternatives for the proposed anesthesia with the patient or authorized representative who has indicated his/her understanding and acceptance.   Dental advisory given  Plan Discussed with: CRNA  Anesthesia Plan Comments:         Anesthesia Quick Evaluation

## 2012-10-14 NOTE — Op Note (Signed)
[facility] [address] [place], [address]   ERCP PROCEDURE REPORT  PATIENT: [name], [name].  MR# :[id number] BIRTHDATE: Oct 11, 1926  GENDER: Female ENDOSCOPIST: Rachael Fee, MD PROCEDURE DATE:  10/14/2012 PROCEDURE:   incomplete ERCP (failed biliary cannulation); EGD with biopsies ASA CLASS:   Class III INDICATIONS:duodenal mass which has recently bled (EGDs Dr.  Russella Dar and Arlyce Dice); CT scan suggested CBD stone however EUS just prior to this procedure showed no CBD stones. MEDICATIONS: Cipro 400 mg IV and General endotracheal anesthesia (GETA) TOPICAL ANESTHETIC: none  DESCRIPTION OF PROCEDURE:   After the risks benefits and alternatives of the procedure were thoroughly explained, informed consent was obtained.  The Pentax ERCP [id number]  endoscope was introduced through the mouth  and advanced to the second portion of the duodenum  without detailed examination of th UGI tract.  The previously described duodenal mass was well visualized with side viewing scope.  The major papilla was 2cm caudal to the mass. The previously placed endoclips were not visible at the mass. The mass was causing minor anatomic distortion which led to some biliary cannulation difficulty.  I was unable to cannulate the CBD despite multiple attempts with wire, scope postions changes (long/short) etc.  Since I did not see stones in CBD on EUS just prior to this procedure I elected not to continue cannulation attempts and instead proceed with extensive mucosal biospies of the dudoenal mass.    COMPLICATIONS:  There were no complications.  ENDOSCOPIC IMPRESSION: The previously described duodenal mass lies about 2cm proximal to the major papilla. The mass was extensively biopsied with forceps following multiple unsuccesful attempts to cannulate the bile duct. 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  She was on coumadin at that time for A. fib and that contributed to the amount of blood lost. Since then, coumadin has been held and she has had no overt bleeding. Await final biopsies.  She is 78 years old with several cormorbidities.  The mass would likely require Whipple procedure to remove.  RECOMMENDATIONS: await biopsy results.   _______________________________ eSignedRachael Fee, MD 10/14/2012 12:17 PM   CC:  Claudette Head, MD

## 2012-10-14 NOTE — Transfer of Care (Signed)
Immediate Anesthesia Transfer of Care Note  Patient: [name]  Procedure(s) Performed: Procedure(s) (LRB) with comments: ENDOSCOPIC RETROGRADE CHOLANGIOPANCREATOGRAPHY (ERCP) WITH PROPOFOL (N/A) UPPER ENDOSCOPIC ULTRASOUND (EUS) LINEAR (N/A)  Patient Location: PACU  Anesthesia Type:General  Level of Consciousness: awake, alert  and oriented  Airway & Oxygen Therapy: Patient Spontanous Breathing and Patient connected to face mask oxygen  Post-op Assessment: Report given to PACU RN and Post -op Vital signs reviewed and stable  Post vital signs: Reviewed and stable  Complications: No apparent anesthesia complications

## 2012-10-14 NOTE — H&P (Signed)
  HPI: This is an 78 yo woman recently bleeding from duodenal mass, also found to have CBD stones and GB stones.  Coumadin held and no recurrent bleeding.      Past Medical History  Diagnosis Date  . 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 DVT (deep venous thrombosis)   . Peripheral neuropathy   . Arthritis   . Diabetes mellitus   . Hyperlipidemia   . Hypertension   . CHF (congestive heart failure)   . Anemia   . Cholelithiasis   . GI bleed   . Breast cancer 2012    right    Past Surgical History  Procedure Date  . Breast surgery   . Esophagogastroduodenoscopy 09/01/2012    Procedure: ESOPHAGOGASTRODUODENOSCOPY (EGD);  Surgeon: Malcolm T Stark, MD,FACG;  Location: [facility] ENDOSCOPY;  Service: Endoscopy;  Laterality: N/A;  . Esophagogastroduodenoscopy 09/03/2012    Procedure: ESOPHAGOGASTRODUODENOSCOPY (EGD);  Surgeon: Robert D Kaplan, MD;  Location: [facility] ENDOSCOPY;  Service: Endoscopy;  Laterality: N/A;    Current Facility-Administered Medications  Medication Dose Route Frequency Provider Last Rate Last Dose  . 0.9 %  sodium chloride infusion   Intravenous Continuous Daniel P Jacobs, MD      . lactated ringers infusion   Intravenous Continuous Daniel P Jacobs, MD 20 mL/hr at 10/14/12 0952 1,000 mL at 10/14/12 0952    Allergies as of 09/13/2012 - Review Complete 09/13/2012  Allergen Reaction Noted  . Peach (prunus persica) Rash 09/13/2012    History reviewed. No pertinent family history.  History   Social History  . Marital Status: Widowed    Spouse Name: N/A    Number of Children: 4  . Years of Education: N/A   Occupational History  . RETIRED    Social History Main Topics  . Smoking status: Never Smoker   . Smokeless tobacco: Never Used  . Alcohol Use: No  . Drug Use: No  . Sexually Active: Not Currently   Other Topics Concern  . Not on file   Social History Narrative  . No narrative on file       Physical Exam: BP 140/77  Temp 97.7 F (36.5 C) (Oral)  Resp 17  Ht 5' 2" (1.575 m)  Wt 239 lb (108.41 kg)  BMI 43.71 kg/m2  SpO2 95% Constitutional: gfrail, obese Psychiatric: alert and oriented x3 Abdomen: soft, nontender, nondistended, no obvious ascites, no peritoneal signs, normal bowel sounds     Assessment and plan: 78 y.o. female with CBD stones, duodenal mass  For EUS and ERCP.  I spoke with daughter and grandaughter and patient, explained risks of procedures including perf, bleeding, pancreatitis.  Will proceed.  

## 2012-10-14 NOTE — Anesthesia Postprocedure Evaluation (Signed)
  Anesthesia Post-op Note  Patient: [name]  Procedure(s) Performed: Procedure(s) (LRB): ENDOSCOPIC RETROGRADE CHOLANGIOPANCREATOGRAPHY (ERCP) WITH PROPOFOL (N/A) UPPER ENDOSCOPIC ULTRASOUND (EUS) LINEAR (N/A)  Patient Location: PACU  Anesthesia Type: General  Level of Consciousness: awake and alert   Airway and Oxygen Therapy: Patient Spontanous Breathing  Post-op Pain: mild  Post-op Assessment: Post-op Vital signs reviewed, Patient's Cardiovascular Status Stable, Respiratory Function Stable, Patent Airway and No signs of Nausea or vomiting  Post-op Vital Signs: stable  Complications: No apparent anesthesia complications

## 2012-10-15 ENCOUNTER — Encounter (HOSPITAL_COMMUNITY): Payer: Self-pay | Admitting: Gastroenterology

## 2012-10-19 ENCOUNTER — Telehealth: Payer: Self-pay | Admitting: Gastroenterology

## 2012-10-21 ENCOUNTER — Telehealth: Payer: Self-pay

## 2012-10-21 ENCOUNTER — Encounter: Payer: Self-pay | Admitting: Gastroenterology

## 2012-10-21 ENCOUNTER — Other Ambulatory Visit: Payer: Self-pay

## 2012-10-21 DIAGNOSIS — K3189 Other diseases of stomach and duodenum: Secondary | ICD-10-CM

## 2012-10-21 NOTE — Telephone Encounter (Signed)
This is Dr Francis Dowse patient.  Per the discharge summary from 08/3012 it says will hold Coumadin for now and GI advises.  You need to talk with his office

## 2012-10-21 NOTE — Telephone Encounter (Signed)
10/21/2012    RE: [name] DOB: 11/05/1926 MRN: [id number]   Dear Dr Ladona Ridgel,    We have scheduled the above patient for an endoscopic procedure with Dr Christella Hartigan. Our records show that she is on anticoagulation therapy.   Please advise as to how long the patient may come off her therapy of Coumadin prior to the procedure, which is scheduled for 11/18/12.  Please fax back/ or route the completed form to Jassiah Viviano  at [phone].   Sincerely,  Chales Abrahams [facility])   Please respond by 11/07/12 and route back to [facility])

## 2012-10-21 NOTE — Telephone Encounter (Signed)
Dr Christella Hartigan the pt's granddaughter called Marliss Czar) she is concerned about her grandmother having another EUS and doesn't understand why she has to repeat the procedure.  I explained to her that the previous biopsy was non-diagnostic but she had further questions I did not feel comfortable answering,  She would like to speak to you.  Her number is [phone]

## 2012-10-21 NOTE — Telephone Encounter (Signed)
Pt is aware of the procedure as well as meds reviewed and instructed letters have been sent to Dr Sharyn Lull and Dr Ladona Ridgel regarding her coumadin.  Pt was also instructed to hold her diabetes medications the day of her procedure

## 2012-10-22 NOTE — Telephone Encounter (Signed)
i called, left message.  Will try back later this am

## 2012-10-22 NOTE — Telephone Encounter (Signed)
i left another message, asked her to call back to the office so that we can speak

## 2012-10-24 ENCOUNTER — Telehealth: Payer: Self-pay | Admitting: Gastroenterology

## 2012-10-24 NOTE — Telephone Encounter (Signed)
i left another message on VM.  Left my cell number.  [name] can you try to contact her tomorrow?

## 2012-10-24 NOTE — Telephone Encounter (Signed)
i spoke with [name] as well.  She has NOT been taking her coumadin, I advised her to restart it.    [name], They are intersted in different day for EUS ([birth year] is the day before her birthday and they want different day).  Can you look into that.  She will need to hold coumadin for 5 days prior to the EUS whenever it is scheduled).

## 2012-10-25 NOTE — Telephone Encounter (Signed)
No answer and no voice mail.

## 2012-10-25 NOTE — Telephone Encounter (Signed)
See alternate phone note  

## 2012-10-26 NOTE — Telephone Encounter (Signed)
I cannot help her with copay question.  She needs another biopsy attempt.  I discussed this for 20 min with patient and family on phone 2 days ago.

## 2012-10-26 NOTE — Telephone Encounter (Signed)
Pt's granddaughter returned call and was given appt information she was also notified regarding her coumadin and lovenox bridge she will contact Dr Sharyn Lull today and get that set up.  The pt also wanted to make the office aware that she is currently staying with her daughter Neldon Mc at [phone].  The granddaughter number is [phone].  The pt and her granddaughter are still very concerned about the copay because of the repeat procedure.  I explained that the sample from the previous EUS did not show the information that Dr Christella Hartigan needed to diagnose and a second procedure and biopsy is needed.  She does not feel that she should have to pay any additional money because it is not her fault the sample was not adequate.  I spoke with Darcey Nora RN and Austin Miles about the matter.  Pattricia Boss is going to check on the cost and get back to me.  Please advise

## 2012-10-26 NOTE — Telephone Encounter (Signed)
I spoke with the pt and she is aware of the copay and deductible.  I have given her the number to the precert service center to call and set up payment arrangements.  She will call back with any further questions or concerns  I have also called Dr Sharyn Lull and set up the lovenox bridge for the pt

## 2012-10-26 NOTE — Telephone Encounter (Signed)
[name] researched and the pt has a $120 dollar copay and a $3400 dollar deductible.  She is not aware how much of the deductible has been met.  Left message on machine to call back

## 2012-11-05 MED ORDER — IOHEXOL 300 MG/ML  SOLN
INTRAMUSCULAR | Status: AC
  Filled 2012-11-05: qty 1

## 2012-11-09 ENCOUNTER — Encounter (HOSPITAL_COMMUNITY): Payer: Self-pay | Admitting: *Deleted

## 2012-11-09 NOTE — Pre-Procedure Instructions (Signed)
Your procedure is scheduled on: Thursday, December 05,2013 Report to [facility] Admitting [id number] Call this number if you have problems morning of your procedure:[phone]  Follow all bowel prep instructions per your doctor's orders.  Do not eat or drink anything after midnight the night before your procedure. You may brush your teeth, rinse out your mouth, but no water, no food, no chewing gum, no mints, no candies, no chewing tobacco.     Take these medicines the morning of your procedure with A SIP OF WATER:Atenolol,Protonix and Lanoxin    Please make arrangements for a responsible person to drive you home after the procedure. You cannot go home by cab/taxi. We recommend you have someone with you at home the first 24 hours after your procedure. Driver for procedure is grand daughter Leigh  LEAVE ALL VALUABLES, JEWELRY, BILLFOLD AT HOME.  NO DENTURES, CONTACT LENSES ALLOWED IN THE ENDOSCOPY ROOM.   YOU MAY WEAR DEODORANT, PLEASE REMOVE ALL JEWELRY, WATCHES RINGS, BODY PIERCINGS AND LEAVE AT HOME.   WOMEN: NO MAKE-UP, LOTIONS PERFUMES

## 2012-11-11 ENCOUNTER — Ambulatory Visit (HOSPITAL_COMMUNITY)
Admission: RE | Admit: 2012-11-11 | Discharge: 2012-11-11 | Disposition: A | Discharge status: Home / Self Care | Payer: Medicare Other | Source: Ambulatory Visit | Attending: Gastroenterology | Admitting: Gastroenterology

## 2012-11-11 ENCOUNTER — Encounter (HOSPITAL_COMMUNITY): Payer: Self-pay | Admitting: Anesthesiology

## 2012-11-11 ENCOUNTER — Ambulatory Visit (HOSPITAL_COMMUNITY): Payer: Medicare Other | Admitting: Anesthesiology

## 2012-11-11 ENCOUNTER — Encounter (HOSPITAL_COMMUNITY)
Admission: RE | Disposition: A | Discharge status: Home / Self Care | Payer: Self-pay | Source: Ambulatory Visit | Attending: Gastroenterology

## 2012-11-11 ENCOUNTER — Encounter (HOSPITAL_COMMUNITY): Payer: Self-pay

## 2012-11-11 ENCOUNTER — Telehealth: Payer: Self-pay

## 2012-11-11 DIAGNOSIS — I10 Essential (primary) hypertension: Secondary | ICD-10-CM | POA: Insufficient documentation

## 2012-11-11 DIAGNOSIS — E119 Type 2 diabetes mellitus without complications: Secondary | ICD-10-CM | POA: Insufficient documentation

## 2012-11-11 DIAGNOSIS — E785 Hyperlipidemia, unspecified: Secondary | ICD-10-CM | POA: Insufficient documentation

## 2012-11-11 DIAGNOSIS — K319 Disease of stomach and duodenum, unspecified: Secondary | ICD-10-CM

## 2012-11-11 DIAGNOSIS — K3189 Other diseases of stomach and duodenum: Secondary | ICD-10-CM

## 2012-11-11 DIAGNOSIS — Z853 Personal history of malignant neoplasm of breast: Secondary | ICD-10-CM | POA: Insufficient documentation

## 2012-11-11 HISTORY — PX: EUS: SHX5427

## 2012-11-11 LAB — GLUCOSE, CAPILLARY: Glucose-Capillary: 95 mg/dL (ref 70–99)

## 2012-11-11 SURGERY — UPPER ENDOSCOPIC ULTRASOUND (EUS) LINEAR
Anesthesia: Monitor Anesthesia Care

## 2012-11-11 MED ORDER — LACTATED RINGERS IV SOLN
INTRAVENOUS | Status: DC | PRN
  Administered 2012-11-11: 10:00:00 via INTRAVENOUS

## 2012-11-11 MED ORDER — LIDOCAINE HCL (CARDIAC) 20 MG/ML IV SOLN
INTRAVENOUS | Status: DC | PRN
  Administered 2012-11-11: 100 mg via INTRAVENOUS

## 2012-11-11 MED ORDER — BUTAMBEN-TETRACAINE-BENZOCAINE 2-2-14 % EX AERO
INHALATION_SPRAY | CUTANEOUS | Status: DC | PRN
  Administered 2012-11-11: 2 via TOPICAL

## 2012-11-11 MED ORDER — KETAMINE HCL 10 MG/ML IJ SOLN
INTRAMUSCULAR | Status: DC | PRN
  Administered 2012-11-11: 20 mg via INTRAVENOUS

## 2012-11-11 MED ORDER — FENTANYL CITRATE 0.05 MG/ML IJ SOLN
INTRAMUSCULAR | Status: DC | PRN
  Administered 2012-11-11 (×2): 50 ug via INTRAVENOUS

## 2012-11-11 MED ORDER — GLYCOPYRROLATE 0.2 MG/ML IJ SOLN
INTRAMUSCULAR | Status: DC | PRN
  Administered 2012-11-11: 0.2 mg via INTRAVENOUS

## 2012-11-11 MED ORDER — PROPOFOL 10 MG/ML IV EMUL
INTRAVENOUS | Status: DC | PRN
  Administered 2012-11-11: 140 ug/kg/min via INTRAVENOUS

## 2012-11-11 MED ORDER — SODIUM CHLORIDE 0.9 % IV SOLN: INTRAVENOUS | Status: DC

## 2012-11-11 NOTE — Telephone Encounter (Signed)
Blanca to notify pt  

## 2012-11-11 NOTE — Op Note (Signed)
[facility] [address] [place], [address]   ENDOSCOPIC ULTRASOUND PROCEDURE REPORT  PATIENT: [name], [name]  MR#: [id number] BIRTHDATE: 11/05/1926  GENDER: Female ENDOSCOPIST: Rachael Fee, MD REFERRED BY:  Meryl Dare, M.D, [facility] PROCEDURE DATE:  11/11/2012 PROCEDURE:   Upper EUS ASA CLASS:      Class III INDICATIONS:   Overt GI bleeding from duodenal mass (while on coumadin) 6-8 weeks ago.  Underwent EUS 3 weeks ago with mucosal biospies of the lesion, these were non-diagnostic.Marland Kitchen MEDICATIONS: MAC sedation, administered by CRNA  DESCRIPTION OF PROCEDURE:   After the risks benefits and alternatives of the procedure were  explained, informed consent was obtained. The patient was then placed in the left, lateral, decubitus postion and IV sedation was administered. Throughout the procedure, the patients blood pressure, pulse and oxygen saturations were monitored continuously.  Under direct visualization, the PENTAX EUS SCOPE  endoscope was introduced through the mouth  and advanced to the second portion of the duodenum .  Water was used as necessary to provide an acoustic interface.  Upon completion of the imaging, water was removed and the patient was sent to the recovery room in satisfactory condition.   Endoscopic and ultrasonographic findings (with linear echoendoscope and duodenoscope): 1. View was limited.  Niether the linear echoendoscope nor the side viewing duodenoscope was able to view the duodenal mass well.  I think this was due to anatomic distortion created by the mass itself.  Was unable to view the lesion at all with the linear echoendoscope.  Impression: Previous images of the duodenal mass (previous EUS, ERCP, EGD) are all superior to the views from todays examination.   The mass has been documented to have bled while she was on coumadin (overt GI bleeding noted by Dr. Russella Dar by EGD) and should be considered for resection.  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 It lays just cranial to the major papilla and obviously that will impact surgical resection.  It may require Whipple procedure to remove it.  She met Dr. Claud Kelp while hospitalized 2 months ago, my office will refer her back to him.  She has been off coumadin now for several weeks.   _______________________________ eSignedRachael Fee, MD 11/11/2012 11:41 AM

## 2012-11-11 NOTE — Anesthesia Preprocedure Evaluation (Addendum)
Anesthesia Evaluation  Patient identified by MRN, date of birth, ID band Patient awake    Reviewed: Allergy & Precautions, H&P , NPO status , Patient's Chart, lab work & pertinent test results, reviewed documented beta blocker date and time   Airway Mallampati: II TM Distance: >3 FB Neck ROM: full    Dental  (+) Edentulous Upper and Dental Advisory Given   Pulmonary neg pulmonary ROS,  breath sounds clear to auscultation  Pulmonary exam normal       Cardiovascular Exercise Tolerance: Good hypertension, Pt. on medications and Pt. on home beta blockers +CHF negative cardio ROS  + dysrhythmias Atrial Fibrillation Rhythm:Irregular Rate:Normal  ECG AF   Neuro/Psych Does not ambulate well  Neuromuscular disease negative psych ROS   GI/Hepatic negative GI ROS, Neg liver ROS,   Endo/Other  negative endocrine ROSdiabetes, Well Controlled, Type 2, Oral Hypoglycemic Agents  Renal/GU negative Renal ROS  negative genitourinary   Musculoskeletal   Abdominal   Peds  Hematology negative hematology ROS (+)   Anesthesia Other Findings   Reproductive/Obstetrics negative OB ROS                          Anesthesia Physical Anesthesia Plan  ASA: III  Anesthesia Plan: MAC   Post-op Pain Management:    Induction:   Airway Management Planned:   Additional Equipment:   Intra-op Plan:   Post-operative Plan:   Informed Consent: I have reviewed the patients History and Physical, chart, labs and discussed the procedure including the risks, benefits and alternatives for the proposed anesthesia with the patient or authorized representative who has indicated his/her understanding and acceptance.   Dental Advisory Given  Plan Discussed with: CRNA and Surgeon  Anesthesia Plan Comments:         Anesthesia Quick Evaluation

## 2012-11-11 NOTE — H&P (View-Only) (Signed)
  HPI: This is an 78 yo woman recently bleeding from duodenal mass, also found to have CBD stones and GB stones.  Coumadin held and no recurrent bleeding.      Past Medical History  Diagnosis Date  . 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 DVT (deep venous thrombosis)   . Peripheral neuropathy   . Arthritis   . Diabetes mellitus   . Hyperlipidemia   . Hypertension   . CHF (congestive heart failure)   . Anemia   . Cholelithiasis   . GI bleed   . Breast cancer 2012    right    Past Surgical History  Procedure Date  . Breast surgery   . Esophagogastroduodenoscopy 09/01/2012    Procedure: ESOPHAGOGASTRODUODENOSCOPY (EGD);  Surgeon: Meryl Dare, MD,FACG;  Location: [facility] ENDOSCOPY;  Service: Endoscopy;  Laterality: N/A;  . Esophagogastroduodenoscopy 09/03/2012    Procedure: ESOPHAGOGASTRODUODENOSCOPY (EGD);  Surgeon: Louis Meckel, MD;  Location: [facility] ENDOSCOPY;  Service: Endoscopy;  Laterality: N/A;    Current Facility-Administered Medications  Medication Dose Route Frequency Provider Last Rate Last Dose  . 0.9 %  sodium chloride infusion   Intravenous Continuous Rachael Fee, MD      . lactated ringers infusion   Intravenous Continuous Rachael Fee, MD 20 mL/hr at 10/14/12 0952 1,000 mL at 10/14/12 0952    Allergies as of 09/13/2012 - Review Complete 09/13/2012  Allergen Reaction Noted  . Peach (prunus persica) Rash 09/13/2012    History reviewed. No pertinent family history.  History   Social History  . Marital Status: Widowed    Spouse Name: N/A    Number of Children: 4  . Years of Education: N/A   Occupational History  . RETIRED    Social History Main Topics  . Smoking status: Never Smoker   . Smokeless tobacco: Never Used  . Alcohol Use: No  . Drug Use: No  . Sexually Active: Not Currently   Other Topics Concern  . Not on file   Social History Narrative  . No narrative on file       Physical Exam: BP 140/77  Temp 97.7 F (36.5 C) (Oral)  Resp 17  Ht 5\' 2"  (1.575 m)  Wt 239 lb (108.41 kg)  BMI 43.71 kg/m2  SpO2 95% Constitutional: gfrail, obese Psychiatric: alert and oriented x3 Abdomen: soft, nontender, nondistended, no obvious ascites, no peritoneal signs, normal bowel sounds     Assessment and plan: 78 y.o. female with CBD stones, duodenal mass  For EUS and ERCP.  I spoke with daughter and grandaughter and patient, explained risks of procedures including perf, bleeding, pancreatitis.  Will proceed.

## 2012-11-11 NOTE — Preoperative (Signed)
Beta Blockers   Reason not to administer Beta Blockers:Not Applicable 

## 2012-11-11 NOTE — Transfer of Care (Signed)
Immediate Anesthesia Transfer of Care Note  Patient: [name]  Procedure(s) Performed: Procedure(s) (LRB) with comments: UPPER ENDOSCOPIC ULTRASOUND (EUS) LINEAR (N/A)  Patient Location: PACU  Anesthesia Type:MAC  Level of Consciousness: awake and alert   Airway & Oxygen Therapy: Patient Spontanous Breathing and Patient connected to nasal cannula oxygen  Post-op Assessment: Report given to PACU RN and Post -op Vital signs reviewed and stable  Post vital signs: Reviewed and stable  Complications: No apparent anesthesia complications

## 2012-11-11 NOTE — Telephone Encounter (Signed)
Message copied by Donata Duff on Thu Nov 11, 2012  3:44 PM ------      Message from: Marnette Burgess      Created: Thu Nov 11, 2012  3:34 PM       Patient is scheduled to see Dr. Claud Kelp on 12/06/12 @ 9:00am, arrive @ 8:30am.  If you have any questions please call [phone].            Thank You,      Elane Fritz      ----- Message -----         From: Donata Duff, CMA         Sent: 11/11/2012  12:01 PM           To: Marnette Burgess                        ----- Message -----         From: Rachael Fee, MD         Sent: 11/11/2012  11:43 AM           To: Donata Duff, CMA            She needs referral to Dr. Derrell Lolling at [facility].  I believe he met her while hospitalized 2 months ago. Dx: duodenal mass            thanks

## 2012-11-11 NOTE — Anesthesia Postprocedure Evaluation (Signed)
  Anesthesia Post-op Note  Patient: [name]  Procedure(s) Performed: Procedure(s) (LRB): UPPER ENDOSCOPIC ULTRASOUND (EUS) LINEAR (N/A)  Patient Location: PACU  Anesthesia Type: MAC  Level of Consciousness: awake and alert   Airway and Oxygen Therapy: Patient Spontanous Breathing  Post-op Pain: mild  Post-op Assessment: Post-op Vital signs reviewed, Patient's Cardiovascular Status Stable, Respiratory Function Stable, Patent Airway and No signs of Nausea or vomiting  Last Vitals:  Filed Vitals:   11/11/12 1147  BP: 155/80  Pulse:   Temp:   Resp: 19    Post-op Vital Signs: stable   Complications: No apparent anesthesia complications

## 2012-11-11 NOTE — Interval H&P Note (Signed)
History and Physical Interval Note:  11/11/2012 10:02 AM  [name]  has presented today for surgery, with the diagnosis of Duodenal mass [537.89]  The various methods of treatment have been discussed with the patient and family. After consideration of risks, benefits and other options for treatment, the patient has consented to  Procedure(s) (LRB) with comments: UPPER ENDOSCOPIC ULTRASOUND (EUS) LINEAR (N/A) as a surgical intervention .  The patient's history has been reviewed, patient examined, no change in status, stable for surgery.  I have reviewed the patient's chart and labs.  Questions were answered to the patient's satisfaction.     Rob Bunting

## 2012-11-12 ENCOUNTER — Encounter (HOSPITAL_COMMUNITY): Payer: Self-pay | Admitting: Gastroenterology

## 2012-12-06 ENCOUNTER — Encounter (INDEPENDENT_AMBULATORY_CARE_PROVIDER_SITE_OTHER): Payer: Medicare Other | Admitting: General Surgery

## 2012-12-10 NOTE — Telephone Encounter (Signed)
Patient already notified of past results  In Nov and early Dec.

## 2012-12-21 ENCOUNTER — Encounter (INDEPENDENT_AMBULATORY_CARE_PROVIDER_SITE_OTHER): Payer: Medicare Other | Admitting: General Surgery

## 2012-12-31 ENCOUNTER — Encounter (INDEPENDENT_AMBULATORY_CARE_PROVIDER_SITE_OTHER): Payer: Self-pay | Admitting: General Surgery

## 2012-12-31 ENCOUNTER — Ambulatory Visit (INDEPENDENT_AMBULATORY_CARE_PROVIDER_SITE_OTHER): Payer: Medicare Other | Admitting: General Surgery

## 2012-12-31 VITALS — BP 142/76 | HR 60 | Temp 97.7°F | Resp 18 | Ht 63.0 in | Wt 229.2 lb

## 2012-12-31 DIAGNOSIS — K3189 Other diseases of stomach and duodenum: Secondary | ICD-10-CM

## 2012-12-31 DIAGNOSIS — K319 Disease of stomach and duodenum, unspecified: Secondary | ICD-10-CM

## 2012-12-31 NOTE — Assessment & Plan Note (Signed)
The patient does not appear to be a good candidate for an elective Whipple. Based on the location of this probable GI stromal tumor, she would require a Whipple to resect it.  She is 76 and uses a walker for mobility. She is just now starting to get over her episode of bleeding and anemia.  I reviewed the possibilities of rebleeding. I reviewed that she could potentially have an embolization with interventional radiology if she rebled.   I advised her that salvage surgery at that time would carry same risks as elective surgery if not more.  I recommended that she consider these possibilities and discuss with her family her wishes.    25 min spent in consultation.

## 2012-12-31 NOTE — Patient Instructions (Signed)
I would not recommend doing surgery at this time.    I recommend making sure you discuss possibilities with your family if you bleed again.    If you live alone, I recommend having some type of life alert in place.

## 2012-12-31 NOTE — Progress Notes (Signed)
Chief Complaint  Patient presents with  . Mass    est pt new prob- eval duodenal mass    HISTORY: Patient is an 79 year old female who presented last fall with a GI bleed. She was eventually found to have a probable GI Stromal tumor on the mesenteric border of the second portion of the duodenum. This was noted to be just proximal to the ampulla and very close to the pancreatic duct on EUS.  She had adherent clot and bleeding on this that was that was clipped in September. The mass was too small to be seen on CT scan. This does extend into the muscularis layer of the duodenal wall. After the bleeding episode, her Coumadin was stopped. Since that time she has been restarted and has not had any rebleeding. She has any dizziness. She says her blood counts have been okay. She has a history of atrial fibrillation, diabetes, hypertension, and heart failure. She has DCIS that was treated surgically in 2011 and she takes Aromasin for prevention. She gets around with a walker and has been living with her daughter since she left the hospital  Past Medical History  Diagnosis Date  . 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 DVT (deep venous thrombosis)   . Arthritis   . Diabetes mellitus   . Hyperlipidemia   . Hypertension   . CHF (congestive heart failure)   . Anemia   . Cholelithiasis   . GI bleed   . Breast cancer 2012    right  . Peripheral neuropathy   . Heart murmur     Past Surgical History  Procedure Date  . Breast surgery   . Esophagogastroduodenoscopy 09/01/2012    Procedure: ESOPHAGOGASTRODUODENOSCOPY (EGD);  Surgeon: Meryl Dare, MD,FACG;  Location: [facility] ENDOSCOPY;  Service: Endoscopy;  Laterality: N/A;  . Esophagogastroduodenoscopy 09/03/2012    Procedure: ESOPHAGOGASTRODUODENOSCOPY (EGD);  Surgeon: Louis Meckel, MD;  Location: [facility] ENDOSCOPY;  Service: Endoscopy;  Laterality: N/A;  . Endoscopic retrograde  cholangiopancreatography (ercp) with propofol 10/14/2012    Procedure: ENDOSCOPIC RETROGRADE CHOLANGIOPANCREATOGRAPHY (ERCP) WITH PROPOFOL;  Surgeon: Rachael Fee, MD;  Location: [facility] ENDOSCOPY;  Service: Endoscopy;  Laterality: N/A;  . Eus 10/14/2012    Procedure: UPPER ENDOSCOPIC ULTRASOUND (EUS) LINEAR;  Surgeon: Rachael Fee, MD;  Location: [facility] ENDOSCOPY;  Service: Endoscopy;  Laterality: N/A;  . Eus 11/11/2012    Procedure: UPPER ENDOSCOPIC ULTRASOUND (EUS) LINEAR;  Surgeon: Rachael Fee, MD;  Location: [facility] ENDOSCOPY;  Service: Endoscopy;  Laterality: N/A;    Current Outpatient Prescriptions  Medication Sig Dispense Refill  . amLODipine-valsartan (EXFORGE) 5-320 MG per tablet Take 1 tablet by mouth daily.      Marland Kitchen atenolol (TENORMIN) 50 MG tablet Take 50 mg by mouth daily.      Marland Kitchen atorvastatin (LIPITOR) 20 MG tablet Take 20 mg by mouth daily.      . digoxin (LANOXIN) 0.125 MG tablet Take 125 mcg by mouth daily.      . furosemide (LASIX) 40 MG tablet Take 40 mg by mouth daily.      Marland Kitchen glimepiride (AMARYL) 2 MG tablet Take 2 mg by mouth daily before breakfast.      . pantoprazole (PROTONIX) 40 MG tablet Take 1 tablet (40 mg total) by mouth 2 (two) times daily before a meal.  60 tablet  3  . potassium chloride SA (K-DUR,KLOR-CON) 20 MEQ tablet Take 20 mEq by mouth 2 (  two) times daily.      . WARFARIN SODIUM PO Take by mouth. 9 tablets a week.  Pt unsure of what mg         Allergies  Allergen Reactions  . Peach (Prunus Persica) Rash     History reviewed. No pertinent family history.   History   Social History  . Marital Status: Widowed    Spouse Name: N/A    Number of Children: 4  . Years of Education: N/A   Occupational History  . RETIRED    Social History Main Topics  . Smoking status: Never Smoker   . Smokeless tobacco: Never Used  . Alcohol Use: No  . Drug Use: No  . Sexually Active: Not Currently   Other Topics Concern  . None   Social History Narrative  .  None     REVIEW OF SYSTEMS - PERTINENT POSITIVES ONLY: 12 point review of systems negative other than HPI and PMH except for leg swelling, hearing loss, palpitations, joint pain.  EXAM: Filed Vitals:   12/31/12 1033  BP: 142/76  Pulse: 60  Temp: 97.7 F (36.5 C)  Resp: 18    Gen:  No acute distress.  Well nourished and well groomed.   Neurological: Alert and oriented to person, place, and time. Coordination normal.  Head: Normocephalic and atraumatic.  Eyes: Conjunctivae are normal. Pupils are equal, round, and reactive to light. No scleral icterus.  Neck: Normal range of motion. Neck supple. No tracheal deviation or thyromegaly present.  Cardiovascular: Normal rate, regular rhythm, normal heart sounds and intact distal pulses.  Exam reveals no gallop and no friction rub.  No murmur heard. Respiratory: Effort normal.  No respiratory distress. No chest wall tenderness. Breath sounds normal.  No wheezes, rales or rhonchi.  GI: Soft. Bowel sounds are normal. The abdomen is soft and nontender.  There is no rebound and no guarding.  Musculoskeletal: Normal range of motion. Extremities are nontender.  Lymphadenopathy: No cervical, preauricular, postauricular or axillary adenopathy is present Skin: Skin is warm and dry. No rash noted. No diaphoresis. No erythema. No pallor. No clubbing, cyanosis, or edema.   Psychiatric: Normal mood and affect. Behavior is normal. Judgment and thought content normal.    LABORATORY RESULTS: Available labs are reviewed    RADIOLOGY RESULTS: See E-Chart or I-Site for most recent results.  Images and reports are reviewed. CT 08/2012 IMPRESSION:  1. No definite findings to suggest a small bowel associated GI  stromal tumor on this CT examination.  2. Cholelithiasis and choledocholthiasis, without definitive  evidence of biliary tract obstruction at this time. Although the  common bile duct does measure up to 1 cm in diameter, this is  favored to be age  related as there is no intrahepatic biliary  ductal dilatation, and the calcified stone in the common bile duct  is in the mid duct.  3. The appearance of the lower thorax suggest a background of mild  interstitial pulmonary edema. Given the small bilateral pleural  effusions (right greater than left) and cardiomegaly, findings are  concerning for congestive heart failure. Clinical correlation is  recommended.  4. Atherosclerosis including the right coronary artery disease.  5. Mild colonic diverticulosis without findings to suggest acute  diverticulitis at this time.  6. Additional incidental findings, as above.    ASSESSMENT AND PLAN: Duodenal submucosal mass The patient does not appear to be a good candidate for an elective Whipple. Based on the location of this probable GI  stromal tumor, she would require a Whipple to resect it.  She is 17 and uses a walker for mobility. She is just now starting to get over her episode of bleeding and anemia.  I reviewed the possibilities of rebleeding. I reviewed that she could potentially have an embolization with interventional radiology if she rebled.   I advised her that salvage surgery at that time would carry same risks as elective surgery if not more.  I recommended that she consider these possibilities and discuss with her family her wishes.    25 min spent in consultation.       Maudry Diego MD Surgical Oncology, General and Endocrine Surgery [facility]      Visit Diagnoses: 1. Duodenal submucosal mass     Primary Care Physician: Robynn Pane, MD

## 2013-01-06 ENCOUNTER — Encounter (INDEPENDENT_AMBULATORY_CARE_PROVIDER_SITE_OTHER): Payer: Medicare Other | Admitting: General Surgery

## 2013-02-21 ENCOUNTER — Other Ambulatory Visit: Payer: Medicare Other | Admitting: Lab

## 2013-02-21 ENCOUNTER — Ambulatory Visit: Payer: Medicare Other | Admitting: Oncology

## 2013-04-22 ENCOUNTER — Telehealth: Payer: Self-pay | Admitting: *Deleted

## 2013-04-22 ENCOUNTER — Ambulatory Visit (HOSPITAL_BASED_OUTPATIENT_CLINIC_OR_DEPARTMENT_OTHER): Payer: Medicare Other | Admitting: Oncology

## 2013-04-22 ENCOUNTER — Other Ambulatory Visit (HOSPITAL_BASED_OUTPATIENT_CLINIC_OR_DEPARTMENT_OTHER): Payer: Medicare Other | Admitting: Lab

## 2013-04-22 ENCOUNTER — Encounter: Payer: Self-pay | Admitting: Oncology

## 2013-04-22 VITALS — BP 153/77 | HR 59 | Temp 98.7°F | Resp 20 | Ht 63.0 in | Wt 224.3 lb

## 2013-04-22 DIAGNOSIS — D0510 Intraductal carcinoma in situ of unspecified breast: Secondary | ICD-10-CM

## 2013-04-22 DIAGNOSIS — D0590 Unspecified type of carcinoma in situ of unspecified breast: Secondary | ICD-10-CM

## 2013-04-22 DIAGNOSIS — M129 Arthropathy, unspecified: Secondary | ICD-10-CM

## 2013-04-22 DIAGNOSIS — E119 Type 2 diabetes mellitus without complications: Secondary | ICD-10-CM

## 2013-04-22 LAB — CBC WITH DIFFERENTIAL/PLATELET
BASO%: 0.7 % (ref 0.0–2.0)
LYMPH%: 37.4 % (ref 14.0–49.7)
MCHC: 33.2 g/dL (ref 31.5–36.0)
MONO#: 0.7 10*3/uL (ref 0.1–0.9)
RBC: 3.52 10*6/uL — ABNORMAL LOW (ref 3.70–5.45)
WBC: 6.2 10*3/uL (ref 3.9–10.3)
lymph#: 2.3 10*3/uL (ref 0.9–3.3)

## 2013-04-22 LAB — COMPREHENSIVE METABOLIC PANEL (CC13)
ALT: 8 U/L (ref 0–55)
CO2: 25 mEq/L (ref 22–29)
Chloride: 104 mEq/L (ref 98–107)
Sodium: 139 mEq/L (ref 136–145)
Total Bilirubin: 0.65 mg/dL (ref 0.20–1.20)
Total Protein: 7.5 g/dL (ref 6.4–8.3)

## 2013-04-22 NOTE — Patient Instructions (Addendum)
Doing well  I will see you back in 1 year as discussed

## 2013-04-22 NOTE — Telephone Encounter (Signed)
appts made and printed...td 

## 2013-04-22 NOTE — Progress Notes (Signed)
OFFICE PROGRESS NOTE  CC Dr. Rinaldo Cloud Dr. Almond Lint Dr. Chipper Herb  DIAGNOSIS:  79 year old female DCIS diagnosed 07/2010  PRIOR THERAPY: 1. 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 Tumor was ER+  2. S/p radiation therapy to the right breast completed November 2011.  3. Tamoxifen 20 mg from October 21 2010 - November 13, 2010 but discontinued due to possible DVT of the right leg.  3. Switched to [employer] 25 daily starting 12/14/11patient was discontinued due intolerance and side effects  CURRENT THERAPY: observation  INTERVAL HISTORY: JILL STOPKA 79 y.o. female returns for follow up visit. Overall she's doing well. She denies any shortness of breath chest pains or palpitations she has no nausea or vomiting no myalgias or arthralgias. She has no dominant no pain. Remainder of the 10 point review of systems is unremarkable. MEDICAL HISTORY: Past Medical History  Diagnosis Date  . 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 DVT (deep venous thrombosis)   . Arthritis   . Diabetes mellitus   . Hyperlipidemia   . Hypertension   . CHF (congestive heart failure)   . Anemia   . Cholelithiasis   . GI bleed   . Breast cancer 2012    right  . Peripheral neuropathy   . Heart murmur     ALLERGIES:  is allergic to peach.  MEDICATIONS:  Current Outpatient Prescriptions  Medication Sig Dispense Refill  . amLODipine-valsartan (EXFORGE) 5-320 MG per tablet Take 1 tablet by mouth daily.      Marland Kitchen atenolol (TENORMIN) 50 MG tablet Take 50 mg by mouth daily.      Marland Kitchen atorvastatin (LIPITOR) 20 MG tablet Take 20 mg by mouth daily.      . digoxin (LANOXIN) 0.125 MG tablet Take 125 mcg by mouth daily.      . furosemide (LASIX) 40 MG tablet Take 40 mg by mouth daily.      Marland Kitchen glimepiride (AMARYL) 2 MG tablet Take 2 mg by mouth daily before breakfast.      . pantoprazole (PROTONIX) 40 MG tablet Take 1  tablet (40 mg total) by mouth 2 (two) times daily before a meal.  60 tablet  3  . potassium chloride SA (K-DUR,KLOR-CON) 20 MEQ tablet Take 20 mEq by mouth 2 (two) times daily.      . WARFARIN SODIUM PO Take by mouth. 1 a day, then 2 on Thurs. And Sat.  Pt unsure of what mg       No current facility-administered medications for this visit.    SURGICAL HISTORY:  Past Surgical History  Procedure Laterality Date  . Breast surgery    . Esophagogastroduodenoscopy  09/01/2012    Procedure: ESOPHAGOGASTRODUODENOSCOPY (EGD);  Surgeon: Meryl Dare, MD,FACG;  Location: [facility] ENDOSCOPY;  Service: Endoscopy;  Laterality: N/A;  . Esophagogastroduodenoscopy  09/03/2012    Procedure: ESOPHAGOGASTRODUODENOSCOPY (EGD);  Surgeon: Louis Meckel, MD;  Location: [facility] ENDOSCOPY;  Service: Endoscopy;  Laterality: N/A;  . Endoscopic retrograde cholangiopancreatography (ercp) with propofol  10/14/2012    Procedure: ENDOSCOPIC RETROGRADE CHOLANGIOPANCREATOGRAPHY (ERCP) WITH PROPOFOL;  Surgeon: Rachael Fee, MD;  Location: [facility] ENDOSCOPY;  Service: Endoscopy;  Laterality: N/A;  . Eus  10/14/2012    Procedure: UPPER ENDOSCOPIC ULTRASOUND (EUS) LINEAR;  Surgeon: Rachael Fee, MD;  Location: [facility] ENDOSCOPY;  Service: Endoscopy;  Laterality: N/A;  . Eus  11/11/2012  Procedure: UPPER ENDOSCOPIC ULTRASOUND (EUS) LINEAR;  Surgeon: Rachael Fee, MD;  Location: [facility] ENDOSCOPY;  Service: Endoscopy;  Laterality: N/A;    REVIEW OF SYSTEMS:  Pertinent items are noted in HPI.   PHYSICAL EXAMINATION:  Well developed female in no acute distress HEENT exam EOMI PERRLA sclerae anicteric no conjunctival pallor oral mucosa is moist neck is supple lungs are clear bilaterally to auscultation cardiovascular is regular rate rhythm no murmurs gallops or rubs abdomen is soft nontender nondistended bowel sounds are present no HSM extremities no edema neuro patient's alert oriented otherwise nonfocal Right breast well healed scar no masses or  nipple discharge, darkening of the skin. LEft Breast: no masses or nipple dischrge  ECOG PERFORMANCE STATUS: 2 - Symptomatic, <50% confined to bed  Blood pressure 153/77, pulse 59, temperature 98.7 F (37.1 C), temperature source Oral, resp. rate 20, height 5\' 3"  (1.6 m), weight 224 lb 4.8 oz (101.742 kg).  LABORATORY DATA: Lab Results  Component Value Date   WBC 6.2 04/22/2013   HGB 11.7 04/22/2013   HCT 35.2 04/22/2013   MCV 100.1 04/22/2013   PLT 167 04/22/2013      Chemistry      Component Value Date/Time   NA 135 09/13/2012 1101   NA 140 08/24/2012 0859   K 4.1 09/13/2012 1101   K 3.9 08/24/2012 0859   CL 101 09/13/2012 1101   CL 106 08/24/2012 0859   CO2 28 09/13/2012 1101   CO2 24 08/24/2012 0859   BUN 7 09/13/2012 1101   BUN 17.0 08/24/2012 0859   CREATININE 1.3* 09/13/2012 1101   CREATININE 1.2* 08/24/2012 0859      Component Value Date/Time   CALCIUM 8.2* 09/13/2012 1101   CALCIUM 8.3* 08/24/2012 0859   ALKPHOS 80 09/13/2012 1101   ALKPHOS 89 08/24/2012 0859   AST 26 09/13/2012 1101   AST 22 08/24/2012 0859   ALT 17 09/13/2012 1101   ALT 11 08/24/2012 0859   BILITOT 0.5 09/13/2012 1101   BILITOT 0.90 08/24/2012 0859       RADIOGRAPHIC STUDIES:  No results found.  ASSESSMENT: 79 year old female with: 1. DCIS doing well observation 2. Diabetes 3 Arthritis    PLAN:  1. Continue to be seen once a year  All questions were answered. The patient knows to call the clinic with any problems, questions or concerns. We can certainly see the patient much sooner if necessary.  I spent 15 minutes counseling the patient face to face. The total time spent in the appointment was 30 minutes.    Drue Second, MD Medical/Oncology [facility] [phone] (beeper) [phone] (Office)  04/22/2013, 1:25 PM

## 2013-06-08 ENCOUNTER — Other Ambulatory Visit: Payer: Self-pay | Admitting: Oncology

## 2013-06-08 DIAGNOSIS — Z853 Personal history of malignant neoplasm of breast: Secondary | ICD-10-CM

## 2013-06-08 DIAGNOSIS — Z9889 Other specified postprocedural states: Secondary | ICD-10-CM

## 2013-07-06 ENCOUNTER — Ambulatory Visit
Admission: RE | Admit: 2013-07-06 | Discharge: 2013-07-06 | Disposition: A | Discharge status: Home / Self Care | Payer: Medicare Other | Source: Ambulatory Visit | Attending: Oncology | Admitting: Oncology

## 2013-07-06 DIAGNOSIS — Z9889 Other specified postprocedural states: Secondary | ICD-10-CM

## 2013-07-06 DIAGNOSIS — Z853 Personal history of malignant neoplasm of breast: Secondary | ICD-10-CM

## 2013-10-31 ENCOUNTER — Emergency Department (HOSPITAL_COMMUNITY)
Admission: EM | Admit: 2013-10-31 | Discharge: 2013-10-31 | Disposition: A | Discharge status: Home / Self Care | Payer: Medicare Other | Attending: Emergency Medicine | Admitting: Emergency Medicine

## 2013-10-31 ENCOUNTER — Emergency Department (HOSPITAL_COMMUNITY): Payer: Medicare Other

## 2013-10-31 ENCOUNTER — Encounter (HOSPITAL_COMMUNITY): Payer: Self-pay | Admitting: Emergency Medicine

## 2013-10-31 DIAGNOSIS — Z853 Personal history of malignant neoplasm of breast: Secondary | ICD-10-CM | POA: Insufficient documentation

## 2013-10-31 DIAGNOSIS — I509 Heart failure, unspecified: Secondary | ICD-10-CM | POA: Insufficient documentation

## 2013-10-31 DIAGNOSIS — R059 Cough, unspecified: Secondary | ICD-10-CM | POA: Insufficient documentation

## 2013-10-31 DIAGNOSIS — Z8669 Personal history of other diseases of the nervous system and sense organs: Secondary | ICD-10-CM | POA: Insufficient documentation

## 2013-10-31 DIAGNOSIS — Z86718 Personal history of other venous thrombosis and embolism: Secondary | ICD-10-CM | POA: Insufficient documentation

## 2013-10-31 DIAGNOSIS — Z862 Personal history of diseases of the blood and blood-forming organs and certain disorders involving the immune mechanism: Secondary | ICD-10-CM | POA: Insufficient documentation

## 2013-10-31 DIAGNOSIS — I10 Essential (primary) hypertension: Secondary | ICD-10-CM | POA: Insufficient documentation

## 2013-10-31 DIAGNOSIS — Z8739 Personal history of other diseases of the musculoskeletal system and connective tissue: Secondary | ICD-10-CM | POA: Insufficient documentation

## 2013-10-31 DIAGNOSIS — R42 Dizziness and giddiness: Secondary | ICD-10-CM | POA: Insufficient documentation

## 2013-10-31 DIAGNOSIS — Z8719 Personal history of other diseases of the digestive system: Secondary | ICD-10-CM | POA: Insufficient documentation

## 2013-10-31 DIAGNOSIS — R011 Cardiac murmur, unspecified: Secondary | ICD-10-CM | POA: Insufficient documentation

## 2013-10-31 DIAGNOSIS — E785 Hyperlipidemia, unspecified: Secondary | ICD-10-CM | POA: Insufficient documentation

## 2013-10-31 DIAGNOSIS — R609 Edema, unspecified: Secondary | ICD-10-CM | POA: Insufficient documentation

## 2013-10-31 DIAGNOSIS — R55 Syncope and collapse: Secondary | ICD-10-CM | POA: Insufficient documentation

## 2013-10-31 DIAGNOSIS — Z7901 Long term (current) use of anticoagulants: Secondary | ICD-10-CM | POA: Insufficient documentation

## 2013-10-31 DIAGNOSIS — E119 Type 2 diabetes mellitus without complications: Secondary | ICD-10-CM | POA: Insufficient documentation

## 2013-10-31 DIAGNOSIS — Z79899 Other long term (current) drug therapy: Secondary | ICD-10-CM | POA: Insufficient documentation

## 2013-10-31 DIAGNOSIS — R05 Cough: Secondary | ICD-10-CM | POA: Insufficient documentation

## 2013-10-31 DIAGNOSIS — R4182 Altered mental status, unspecified: Secondary | ICD-10-CM | POA: Insufficient documentation

## 2013-10-31 LAB — PROTIME-INR: Prothrombin Time: 28.7 seconds — ABNORMAL HIGH (ref 11.6–15.2)

## 2013-10-31 LAB — COMPREHENSIVE METABOLIC PANEL
ALT: 10 U/L (ref 0–35)
AST: 25 U/L (ref 0–37)
Alkaline Phosphatase: 92 U/L (ref 39–117)
BUN: 17 mg/dL (ref 6–23)
CO2: 26 mEq/L (ref 19–32)
GFR calc Af Amer: 72 mL/min — ABNORMAL LOW (ref >90)
GFR calc non Af Amer: 62 mL/min — ABNORMAL LOW (ref >90)
Glucose, Bld: 94 mg/dL (ref 70–99)
Potassium: 4.5 mEq/L (ref 3.5–5.1)
Sodium: 139 mEq/L (ref 135–145)
Total Protein: 7.9 g/dL (ref 6.0–8.3)

## 2013-10-31 LAB — CBC WITH DIFFERENTIAL/PLATELET
Basophils Absolute: 0 10*3/uL (ref 0.0–0.1)
Basophils Relative: 1 % (ref 0–1)
Eosinophils Absolute: 0.1 10*3/uL (ref 0.0–0.7)
Eosinophils Relative: 2 % (ref 0–5)
HCT: 38.2 % (ref 36.0–46.0)
Lymphocytes Relative: 34 % (ref 12–46)
MCH: 31.9 pg (ref 26.0–34.0)
MCHC: 32.7 g/dL (ref 30.0–36.0)
MCV: 97.4 fL (ref 78.0–100.0)
Monocytes Absolute: 0.6 10*3/uL (ref 0.1–1.0)
Platelets: 146 10*3/uL — ABNORMAL LOW (ref 150–400)
RDW: 14 % (ref 11.5–15.5)
WBC: 6.4 10*3/uL (ref 4.0–10.5)

## 2013-10-31 LAB — LACTIC ACID, PLASMA: Lactic Acid, Venous: 2.8 mmol/L — ABNORMAL HIGH (ref 0.5–2.2)

## 2013-10-31 LAB — PRO B NATRIURETIC PEPTIDE: Pro B Natriuretic peptide (BNP): 1548 pg/mL — ABNORMAL HIGH (ref 0–450)

## 2013-10-31 MED ORDER — FUROSEMIDE 20 MG PO TABS: 40.0000 mg | ORAL_TABLET | Freq: Every day | ORAL | Status: DC

## 2013-10-31 MED ORDER — FUROSEMIDE 10 MG/ML IJ SOLN
80.0000 mg | Freq: Once | INTRAMUSCULAR | Status: AC
  Administered 2013-10-31: 80 mg via INTRAVENOUS
  Filled 2013-10-31: qty 8

## 2013-10-31 NOTE — ED Notes (Signed)
Pt undressed, in gown, on monitor, continuous pulse oximetry and blood pressure cuff 

## 2013-10-31 NOTE — ED Notes (Signed)
Pt here by [facility] ems for syncopal episode, pt daughter was picking her up and she looked and seen her unresponsive, pt was foaming at mouth and within seconds was normal again, hx of same in past, pt reports felt hot and dizzy and then woke up, ems denies postictal state.

## 2013-10-31 NOTE — ED Notes (Signed)
Patient transported to X-ray and CT 

## 2013-10-31 NOTE — ED Notes (Signed)
Pt resting, no needs at this time; family has stepped out but will return later

## 2013-10-31 NOTE — ED Provider Notes (Signed)
CSN: [id number]     Arrival date & time 10/31/13  0905 History   First MD Initiated Contact with Patient 10/31/13 [phone]     Chief Complaint  Patient presents with  . Near Syncope   HPI  Patient presents after an episode of altered mental status. History of present illness is per the patient's daughter, and the patient. The 20 hours prior to ED arrival the patient had one episode of brief decreased interactivity, with a weight overexpression, mild foaming at the mouth, minimal responsiveness.  The patient herself denies prodrome, states that upon recovery she was back to normal. She is no history of similar episodes.  No history of seizures. Per EMS, the patient was awake alert and appropriate on their arrival. The patient currently denies any complaints.    Past Medical History  Diagnosis Date  . 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 DVT (deep venous thrombosis)   . Arthritis   . Diabetes mellitus   . Hyperlipidemia   . Hypertension   . CHF (congestive heart failure)   . Anemia   . Cholelithiasis   . GI bleed   . Breast cancer 2012    right  . Peripheral neuropathy   . Heart murmur    Past Surgical History  Procedure Laterality Date  . Breast surgery    . Esophagogastroduodenoscopy  09/01/2012    Procedure: ESOPHAGOGASTRODUODENOSCOPY (EGD);  Surgeon: Meryl Dare, MD,FACG;  Location: [facility] ENDOSCOPY;  Service: Endoscopy;  Laterality: N/A;  . Esophagogastroduodenoscopy  09/03/2012    Procedure: ESOPHAGOGASTRODUODENOSCOPY (EGD);  Surgeon: Louis Meckel, MD;  Location: [facility] ENDOSCOPY;  Service: Endoscopy;  Laterality: N/A;  . Endoscopic retrograde cholangiopancreatography (ercp) with propofol  10/14/2012    Procedure: ENDOSCOPIC RETROGRADE CHOLANGIOPANCREATOGRAPHY (ERCP) WITH PROPOFOL;  Surgeon: Rachael Fee, MD;  Location: [facility] ENDOSCOPY;  Service: Endoscopy;  Laterality: N/A;  . Eus  10/14/2012    Procedure: UPPER  ENDOSCOPIC ULTRASOUND (EUS) LINEAR;  Surgeon: Rachael Fee, MD;  Location: [facility] ENDOSCOPY;  Service: Endoscopy;  Laterality: N/A;  . Eus  11/11/2012    Procedure: UPPER ENDOSCOPIC ULTRASOUND (EUS) LINEAR;  Surgeon: Rachael Fee, MD;  Location: [facility] ENDOSCOPY;  Service: Endoscopy;  Laterality: N/A;   History reviewed. No pertinent family history. History  Substance Use Topics  . Smoking status: Never Smoker   . Smokeless tobacco: Never Used  . Alcohol Use: No   OB History   Grav Para Term Preterm Abortions TAB SAB Ect Mult Living                 Review of Systems  Constitutional:       Per HPI, otherwise negative  HENT:       Per HPI, otherwise negative  Respiratory:       Per HPI, otherwise negative  Cardiovascular:       Per HPI, otherwise negative  Gastrointestinal: Negative for vomiting.  Endocrine:       Negative aside from HPI  Genitourinary:       Neg aside from HPI   Musculoskeletal:       Per HPI, otherwise negative  Skin: Negative.   Neurological: Positive for light-headedness. Negative for seizures, syncope, facial asymmetry and speech difficulty.    Allergies  Peach  Home Medications   Current Outpatient Rx  Name  Route  Sig  Dispense  Refill  . amLODipine-valsartan (EXFORGE) 5-320 MG per tablet  Oral   Take 1 tablet by mouth daily.         Marland Kitchen atenolol (TENORMIN) 50 MG tablet   Oral   Take 50 mg by mouth daily.         Marland Kitchen atorvastatin (LIPITOR) 20 MG tablet   Oral   Take 20 mg by mouth daily.         . digoxin (LANOXIN) 0.125 MG tablet   Oral   Take 125 mcg by mouth daily.         . furosemide (LASIX) 40 MG tablet   Oral   Take 40 mg by mouth daily.         Marland Kitchen glimepiride (AMARYL) 2 MG tablet   Oral   Take 2 mg by mouth daily before breakfast.         . pantoprazole (PROTONIX) 40 MG tablet   Oral   Take 1 tablet (40 mg total) by mouth 2 (two) times daily before a meal.   60 tablet   3   . potassium chloride SA  (K-DUR,KLOR-CON) 20 MEQ tablet   Oral   Take 20 mEq by mouth 2 (two) times daily.         Marland Kitchen warfarin (COUMADIN) 2 MG tablet   Oral   Take 2-4 mg by mouth daily. Takes 1 tablet daily, except takes 2 tablets on Thursdays and and Saturdays.          BP 133/60  Pulse 71  Temp(Src) 97.9 F (36.6 C) (Oral)  Resp 22  Ht 5\' 4"  (1.626 m)  Wt 230 lb (104.327 kg)  BMI 39.46 kg/m2  SpO2 99% Physical Exam  Nursing note and vitals reviewed. Constitutional: She is oriented to person, place, and time. She appears well-developed and well-nourished. No distress.  HENT:  Head: Normocephalic and atraumatic.  Eyes: Conjunctivae and EOM are normal.  Cardiovascular: Normal rate.  An irregularly irregular rhythm present.  Murmur heard. Pulmonary/Chest: Effort normal and breath sounds normal. No stridor. No respiratory distress.  Abdominal: She exhibits no distension.  Musculoskeletal: She exhibits no edema.  L>R LE edema, no ttp, no erythema  Neurological: She is alert and oriented to person, place, and time. No cranial nerve deficit.  Skin: Skin is warm and dry.  Psychiatric: She has a normal mood and affect.    ED Course  Procedures (including critical care time) Labs Review Labs Reviewed  COMPREHENSIVE METABOLIC PANEL  LACTIC ACID, PLASMA  PROTIME-INR  URINALYSIS, ROUTINE W REFLEX MICROSCOPIC  CBC WITH DIFFERENTIAL  PRO B NATRIURETIC PEPTIDE   Imaging Review No results found.  EKG Interpretation    Date/Time:  Monday October 31 2013 09:20:12 EST Ventricular Rate:  63 PR Interval:    QRS Duration: 86 QT Interval:  422 QTC Calculation: 432 R Axis:   36 Text Interpretation:  Atrial fibrillation Probable LVH with secondary repol abnrm No significant change since last tracing Abnormal ekg Confirmed by Gerhard Munch  MD ([id number]) on 10/31/2013 9:30:32 AM           [place]: 65 irreg, abn  O2- 96%ra, nml  2:03 PM Patient and in no distress.  She continues to deny any  ongoing complaints.  She, her family members, and I discussed results thus far.  With no complaints, the patient requests discharge.  We discussed increased Lasix short term, with primary care followup for further evaluation, management.  The patient is agreeable with this.  MDM   1. Near syncope    Patient  presents of an episode of near syncope/altered mental status.  Notably, the patient also complains of ongoing cough, increasing edema.  Patient's physical exam: Neurologic exam are reassuring today.  Patient has no distress, remained symptomatically stable throughout her emergency department course today. With evidence of increasing heart failure degree, the patient received IV Lasix, short course of increased Lasix dosing, and was discharged, per her request, to follow up with her primary care physician via telephone today, and person this week.    Gerhard Munch, MD 10/31/13 [phone]

## 2013-10-31 NOTE — ED Notes (Signed)
Returned from radiology and went to restroom. nad ntoed, placed back on monitor.

## 2013-10-31 NOTE — ED Notes (Signed)
Pt being transported to Radiology Department at this time 

## 2014-04-17 ENCOUNTER — Telehealth: Payer: Self-pay

## 2014-04-17 NOTE — Telephone Encounter (Signed)
Let pt know KK out on LOA.  Reschedule to 5/14 lab 1245, APP 115.  Pt needs to confirm with transportation.  Will return call and let us know.

## 2014-04-20 ENCOUNTER — Ambulatory Visit (HOSPITAL_BASED_OUTPATIENT_CLINIC_OR_DEPARTMENT_OTHER): Payer: Medicare Other | Admitting: Oncology

## 2014-04-20 ENCOUNTER — Telehealth: Payer: Self-pay | Admitting: Oncology

## 2014-04-20 ENCOUNTER — Encounter: Payer: Self-pay | Admitting: Oncology

## 2014-04-20 ENCOUNTER — Other Ambulatory Visit (HOSPITAL_BASED_OUTPATIENT_CLINIC_OR_DEPARTMENT_OTHER): Payer: Medicare Other

## 2014-04-20 VITALS — BP 151/59 | HR 53 | Temp 97.4°F | Resp 18 | Ht 64.0 in | Wt 234.3 lb

## 2014-04-20 DIAGNOSIS — D0590 Unspecified type of carcinoma in situ of unspecified breast: Secondary | ICD-10-CM

## 2014-04-20 DIAGNOSIS — D0510 Intraductal carcinoma in situ of unspecified breast: Secondary | ICD-10-CM

## 2014-04-20 LAB — CBC WITH DIFFERENTIAL/PLATELET
BASO%: 0.6 % (ref 0.0–2.0)
Basophils Absolute: 0 10*3/uL (ref 0.0–0.1)
EOS ABS: 0.1 10*3/uL (ref 0.0–0.5)
EOS%: 1.3 % (ref 0.0–7.0)
HEMATOCRIT: 35.5 % (ref 34.8–46.6)
HGB: 11.3 g/dL — ABNORMAL LOW (ref 11.6–15.9)
LYMPH%: 29.5 % (ref 14.0–49.7)
MCH: 31.8 pg (ref 25.1–34.0)
MCHC: 32 g/dL (ref 31.5–36.0)
MCV: 99.3 fL (ref 79.5–101.0)
MONO#: 0.6 10*3/uL (ref 0.1–0.9)
MONO%: 8.9 % (ref 0.0–14.0)
NEUT%: 59.7 % (ref 38.4–76.8)
NEUTROS ABS: 4 10*3/uL (ref 1.5–6.5)
Platelets: 164 10*3/uL (ref 145–400)
RBC: 3.57 10*6/uL — ABNORMAL LOW (ref 3.70–5.45)
RDW: 14.5 % (ref 11.2–14.5)
WBC: 6.6 10*3/uL (ref 3.9–10.3)
lymph#: 2 10*3/uL (ref 0.9–3.3)

## 2014-04-20 LAB — COMPREHENSIVE METABOLIC PANEL (CC13)
ALT: 12 U/L (ref 0–55)
ANION GAP: 10 meq/L (ref 3–11)
AST: 17 U/L (ref 5–34)
Albumin: 3.5 g/dL (ref 3.5–5.0)
Alkaline Phosphatase: 75 U/L (ref 40–150)
BILIRUBIN TOTAL: 0.59 mg/dL (ref 0.20–1.20)
BUN: 27.4 mg/dL — ABNORMAL HIGH (ref 7.0–26.0)
CALCIUM: 9.3 mg/dL (ref 8.4–10.4)
CO2: 26 meq/L (ref 22–29)
Chloride: 104 mEq/L (ref 98–109)
Creatinine: 1.1 mg/dL (ref 0.6–1.1)
GLUCOSE: 73 mg/dL (ref 70–140)
Potassium: 5.2 mEq/L — ABNORMAL HIGH (ref 3.5–5.1)
SODIUM: 139 meq/L (ref 136–145)
TOTAL PROTEIN: 7.6 g/dL (ref 6.4–8.3)

## 2014-04-20 NOTE — Telephone Encounter (Signed)
Gave pt appt for lab,MD and mammogram for July 2015 and May 2016

## 2014-04-20 NOTE — Progress Notes (Signed)
OFFICE PROGRESS NOTE  CC Dr. [name] Dr. [name] Dr. [name]  DIAGNOSIS:  80 year old female DCIS diagnosed 07/2010  PRIOR THERAPY: 1. 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 Tumor was ER+  2. S/p radiation therapy to the right breast completed November 2011.  3. Tamoxifen 20 mg from October 21 2010 - November 13, 2010 but discontinued due to possible DVT of the right leg.  3. Switched to [employer] 25 daily starting 12/14/11patient was discontinued due intolerance and side effects  CURRENT THERAPY: observation  INTERVAL HISTORY: [name] 80 y.o. female returns for follow up visit. Overall she's doing well. She denies any shortness of breath chest pains or palpitations she has no nausea or vomiting no myalgias or arthralgias. Remainder of the 10 point review of systems is unremarkable.  MEDICAL HISTORY: Past Medical History  Diagnosis Date  . 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 DVT (deep venous thrombosis)   . Arthritis   . Diabetes mellitus   . Hyperlipidemia   . Hypertension   . CHF (congestive heart failure)   . Anemia   . Cholelithiasis   . GI bleed   . Breast cancer 2012    right  . Peripheral neuropathy   . Heart murmur     ALLERGIES:  is allergic to peach.  MEDICATIONS:  Current Outpatient Prescriptions  Medication Sig Dispense Refill  . amLODipine-valsartan (EXFORGE) 5-320 MG per tablet Take 1 tablet by mouth daily.      Marland Kitchen atenolol (TENORMIN) 50 MG tablet Take 50 mg by mouth daily.      Marland Kitchen atorvastatin (LIPITOR) 20 MG tablet Take 20 mg by mouth daily.      . digoxin (LANOXIN) 0.125 MG tablet Take 125 mcg by mouth daily.      . furosemide (LASIX) 20 MG tablet Take 2 tablets (40 mg total) by mouth daily. 11/25-11/30 > take an additional 20mg  daily each night.  60 tablet  0  . glimepiride (AMARYL) 2 MG tablet Take 2 mg by mouth daily before  breakfast.      . pantoprazole (PROTONIX) 40 MG tablet Take 1 tablet (40 mg total) by mouth 2 (two) times daily before a meal.  60 tablet  3  . potassium chloride SA (K-DUR,KLOR-CON) 20 MEQ tablet Take 20 mEq by mouth 2 (two) times daily.      Marland Kitchen warfarin (COUMADIN) 2 MG tablet Take 2-4 mg by mouth daily. Takes 1 tablet daily, except takes 2 tablets on Thursdays and and Saturdays.       No current facility-administered medications for this visit.    SURGICAL HISTORY:  Past Surgical History  Procedure Laterality Date  . Breast surgery    . Esophagogastroduodenoscopy  09/01/2012    Procedure: ESOPHAGOGASTRODUODENOSCOPY (EGD);  Surgeon: Ladene Artist, MD,FACG;  Location: [facility] ENDOSCOPY;  Service: Endoscopy;  Laterality: N/A;  . Esophagogastroduodenoscopy  09/03/2012    Procedure: ESOPHAGOGASTRODUODENOSCOPY (EGD);  Surgeon: Inda Castle, MD;  Location: [place];  Service: Endoscopy;  Laterality: N/A;  . Endoscopic retrograde cholangiopancreatography (ercp) with propofol  10/14/2012    Procedure: ENDOSCOPIC RETROGRADE CHOLANGIOPANCREATOGRAPHY (ERCP) WITH PROPOFOL;  Surgeon: Milus Banister, MD;  Location: [facility] ENDOSCOPY;  Service: Endoscopy;  Laterality: N/A;  . Eus  10/14/2012    Procedure: UPPER ENDOSCOPIC ULTRASOUND (EUS) LINEAR;  Surgeon: Milus Banister, MD;  Location: [facility] ENDOSCOPY;  Service: Endoscopy;  Laterality: N/A;  . Eus  11/11/2012    Procedure: UPPER ENDOSCOPIC ULTRASOUND (EUS) LINEAR;  Surgeon: Milus Banister, MD;  Location: [facility] ENDOSCOPY;  Service: Endoscopy;  Laterality: N/A;    REVIEW OF SYSTEMS:  Pertinent items are noted in HPI.   PHYSICAL EXAMINATION:  Well developed female in no acute distress HEENT exam EOMI PERRLA sclerae anicteric no conjunctival pallor oral mucosa is moist neck is supple lungs are clear bilaterally to auscultation cardiovascular is regular rate rhythm no murmurs gallops or rubs abdomen is soft nontender nondistended bowel sounds are present no HSM  extremities no edema neuro patient's alert oriented otherwise nonfocal Right breast well healed scar no masses or nipple discharge, darkening of the skin. Left Breast: no masses or nipple dischrge  ECOG PERFORMANCE STATUS: 2 - Symptomatic, <50% confined to bed  Blood pressure 151/59, pulse 53, temperature 97.4 F (36.3 C), temperature source Oral, resp. rate 18, height 5\' 4"  (1.626 m), weight 234 lb 4.8 oz (106.278 kg), SpO2 95.00%.  LABORATORY DATA: Lab Results  Component Value Date   WBC 6.6 04/20/2014   HGB 11.3* 04/20/2014   HCT 35.5 04/20/2014   MCV 99.3 04/20/2014   PLT 164 04/20/2014      Chemistry      Component Value Date/Time   NA 139 04/20/2014 1240   NA 139 10/31/2013 1128   K 5.2* 04/20/2014 1240   K 4.5 10/31/2013 1128   CL 103 10/31/2013 1128   CL 104 04/22/2013 1125   CO2 26 04/20/2014 1240   CO2 26 10/31/2013 1128   BUN 27.4* 04/20/2014 1240   BUN 17 10/31/2013 1128   CREATININE 1.1 04/20/2014 1240   CREATININE 0.84 10/31/2013 1128      Component Value Date/Time   CALCIUM 9.3 04/20/2014 1240   CALCIUM 8.9 10/31/2013 1128   ALKPHOS 75 04/20/2014 1240   ALKPHOS 92 10/31/2013 1128   AST 17 04/20/2014 1240   AST 25 10/31/2013 1128   ALT 12 04/20/2014 1240   ALT 10 10/31/2013 1128   BILITOT 0.59 04/20/2014 1240   BILITOT 0.8 10/31/2013 1128       RADIOGRAPHIC STUDIES:  No results found.  ASSESSMENT: 80 year old female with: 1. DCIS doing well on observation 2. Diabetes 3 Arthritis    PLAN:  1. Continue to be seen once a year. Mammogram has been requested for July 2015.   All questions were answered. The patient knows to call the clinic with any problems, questions or concerns. We can certainly see the patient much sooner if necessary.  I spent 15 minutes counseling the patient face to face. The total time spent in the appointment was 30 minutes.   Mikey Bussing, DNP, AGPCNP-BC  04/20/2014, 2:44 PM

## 2014-04-24 ENCOUNTER — Ambulatory Visit: Payer: Medicare Other | Admitting: Oncology

## 2014-04-24 ENCOUNTER — Other Ambulatory Visit: Payer: Medicare Other

## 2014-07-07 ENCOUNTER — Ambulatory Visit
Admission: RE | Admit: 2014-07-07 | Discharge: 2014-07-07 | Disposition: A | Discharge status: Home / Self Care | Payer: Medicare Other | Source: Ambulatory Visit | Attending: Oncology | Admitting: Oncology

## 2014-07-07 DIAGNOSIS — D0510 Intraductal carcinoma in situ of unspecified breast: Secondary | ICD-10-CM

## 2014-11-10 ENCOUNTER — Telehealth: Payer: Self-pay | Admitting: Hematology

## 2014-11-10 NOTE — Telephone Encounter (Signed)
, °

## 2015-01-01 ENCOUNTER — Other Ambulatory Visit (HOSPITAL_COMMUNITY): Payer: Self-pay | Admitting: Cardiology

## 2015-01-01 DIAGNOSIS — E669 Obesity, unspecified: Secondary | ICD-10-CM | POA: Diagnosis not present

## 2015-01-01 DIAGNOSIS — I5030 Unspecified diastolic (congestive) heart failure: Secondary | ICD-10-CM | POA: Diagnosis not present

## 2015-01-01 DIAGNOSIS — R079 Chest pain, unspecified: Secondary | ICD-10-CM

## 2015-01-01 DIAGNOSIS — I10 Essential (primary) hypertension: Secondary | ICD-10-CM | POA: Diagnosis not present

## 2015-01-01 DIAGNOSIS — I482 Chronic atrial fibrillation: Secondary | ICD-10-CM | POA: Diagnosis not present

## 2015-01-01 DIAGNOSIS — E785 Hyperlipidemia, unspecified: Secondary | ICD-10-CM | POA: Diagnosis not present

## 2015-01-01 DIAGNOSIS — E119 Type 2 diabetes mellitus without complications: Secondary | ICD-10-CM | POA: Diagnosis not present

## 2015-01-01 DIAGNOSIS — D649 Anemia, unspecified: Secondary | ICD-10-CM | POA: Diagnosis not present

## 2015-01-03 DIAGNOSIS — M1711 Unilateral primary osteoarthritis, right knee: Secondary | ICD-10-CM | POA: Diagnosis not present

## 2015-01-03 DIAGNOSIS — M1712 Unilateral primary osteoarthritis, left knee: Secondary | ICD-10-CM | POA: Diagnosis not present

## 2015-01-08 ENCOUNTER — Encounter (HOSPITAL_COMMUNITY)
Admission: RE | Admit: 2015-01-08 | Discharge: 2015-01-08 | Disposition: A | Discharge status: Home / Self Care | Payer: Commercial Managed Care - HMO | Source: Ambulatory Visit | Attending: Cardiology | Admitting: Cardiology

## 2015-01-08 VITALS — BP 139/66 | HR 63

## 2015-01-08 DIAGNOSIS — R079 Chest pain, unspecified: Secondary | ICD-10-CM | POA: Diagnosis not present

## 2015-01-08 DIAGNOSIS — E119 Type 2 diabetes mellitus without complications: Secondary | ICD-10-CM | POA: Diagnosis not present

## 2015-01-08 DIAGNOSIS — I10 Essential (primary) hypertension: Secondary | ICD-10-CM | POA: Diagnosis not present

## 2015-01-08 MED ORDER — TECHNETIUM TC 99M SESTAMIBI GENERIC - CARDIOLITE
30.0000 | Freq: Once | INTRAVENOUS | Status: AC | PRN
  Administered 2015-01-08: 30 via INTRAVENOUS

## 2015-01-08 MED ORDER — REGADENOSON 0.4 MG/5ML IV SOLN
0.4000 mg | Freq: Once | INTRAVENOUS | Status: AC
  Administered 2015-01-08: 0.4 mg via INTRAVENOUS

## 2015-01-08 MED ORDER — TECHNETIUM TC 99M SESTAMIBI GENERIC - CARDIOLITE
10.0000 | Freq: Once | INTRAVENOUS | Status: AC | PRN
  Administered 2015-01-08: 10 via INTRAVENOUS

## 2015-01-08 MED ORDER — REGADENOSON 0.4 MG/5ML IV SOLN
INTRAVENOUS | Status: AC
  Filled 2015-01-08: qty 5

## 2015-01-11 DIAGNOSIS — M1712 Unilateral primary osteoarthritis, left knee: Secondary | ICD-10-CM | POA: Diagnosis not present

## 2015-01-11 DIAGNOSIS — M1711 Unilateral primary osteoarthritis, right knee: Secondary | ICD-10-CM | POA: Diagnosis not present

## 2015-01-17 DIAGNOSIS — M1712 Unilateral primary osteoarthritis, left knee: Secondary | ICD-10-CM | POA: Diagnosis not present

## 2015-01-17 DIAGNOSIS — M1711 Unilateral primary osteoarthritis, right knee: Secondary | ICD-10-CM | POA: Diagnosis not present

## 2015-01-29 DIAGNOSIS — I482 Chronic atrial fibrillation: Secondary | ICD-10-CM | POA: Diagnosis not present

## 2015-02-14 DIAGNOSIS — I1310 Hypertensive heart and chronic kidney disease without heart failure, with stage 1 through stage 4 chronic kidney disease, or unspecified chronic kidney disease: Secondary | ICD-10-CM | POA: Diagnosis not present

## 2015-02-14 DIAGNOSIS — N183 Chronic kidney disease, stage 3 (moderate): Secondary | ICD-10-CM | POA: Diagnosis not present

## 2015-02-14 DIAGNOSIS — E1121 Type 2 diabetes mellitus with diabetic nephropathy: Secondary | ICD-10-CM | POA: Diagnosis not present

## 2015-02-14 DIAGNOSIS — R609 Edema, unspecified: Secondary | ICD-10-CM | POA: Diagnosis not present

## 2015-02-14 DIAGNOSIS — N08 Glomerular disorders in diseases classified elsewhere: Secondary | ICD-10-CM | POA: Diagnosis not present

## 2015-03-05 DIAGNOSIS — I482 Chronic atrial fibrillation: Secondary | ICD-10-CM | POA: Diagnosis not present

## 2015-04-02 DIAGNOSIS — I509 Heart failure, unspecified: Secondary | ICD-10-CM | POA: Diagnosis not present

## 2015-04-02 DIAGNOSIS — I10 Essential (primary) hypertension: Secondary | ICD-10-CM | POA: Diagnosis not present

## 2015-04-02 DIAGNOSIS — E669 Obesity, unspecified: Secondary | ICD-10-CM | POA: Diagnosis not present

## 2015-04-02 DIAGNOSIS — E119 Type 2 diabetes mellitus without complications: Secondary | ICD-10-CM | POA: Diagnosis not present

## 2015-04-02 DIAGNOSIS — E785 Hyperlipidemia, unspecified: Secondary | ICD-10-CM | POA: Diagnosis not present

## 2015-04-02 DIAGNOSIS — D649 Anemia, unspecified: Secondary | ICD-10-CM | POA: Diagnosis not present

## 2015-04-02 DIAGNOSIS — I482 Chronic atrial fibrillation: Secondary | ICD-10-CM | POA: Diagnosis not present

## 2015-04-03 DIAGNOSIS — E784 Other hyperlipidemia: Secondary | ICD-10-CM | POA: Diagnosis not present

## 2015-04-03 DIAGNOSIS — Z0100 Encounter for examination of eyes and vision without abnormal findings: Secondary | ICD-10-CM | POA: Diagnosis not present

## 2015-04-04 DIAGNOSIS — H2512 Age-related nuclear cataract, left eye: Secondary | ICD-10-CM | POA: Diagnosis not present

## 2015-04-04 DIAGNOSIS — H3500 Unspecified background retinopathy: Secondary | ICD-10-CM | POA: Diagnosis not present

## 2015-04-04 DIAGNOSIS — H25012 Cortical age-related cataract, left eye: Secondary | ICD-10-CM | POA: Diagnosis not present

## 2015-04-04 DIAGNOSIS — H3562 Retinal hemorrhage, left eye: Secondary | ICD-10-CM | POA: Diagnosis not present

## 2015-04-13 DIAGNOSIS — E119 Type 2 diabetes mellitus without complications: Secondary | ICD-10-CM | POA: Diagnosis not present

## 2015-04-13 DIAGNOSIS — I482 Chronic atrial fibrillation: Secondary | ICD-10-CM | POA: Diagnosis not present

## 2015-04-13 DIAGNOSIS — I509 Heart failure, unspecified: Secondary | ICD-10-CM | POA: Diagnosis not present

## 2015-04-13 DIAGNOSIS — I10 Essential (primary) hypertension: Secondary | ICD-10-CM | POA: Diagnosis not present

## 2015-04-19 ENCOUNTER — Telehealth: Payer: Self-pay | Admitting: Hematology

## 2015-04-19 NOTE — Telephone Encounter (Signed)
R/s appt per pt grand daughter....printed new sched.

## 2015-04-20 ENCOUNTER — Other Ambulatory Visit: Payer: Medicare Other

## 2015-04-20 ENCOUNTER — Ambulatory Visit: Payer: Medicare Other | Admitting: Adult Health

## 2015-04-20 ENCOUNTER — Ambulatory Visit: Payer: Medicare Other | Admitting: Hematology

## 2015-05-02 ENCOUNTER — Other Ambulatory Visit: Payer: Self-pay | Admitting: *Deleted

## 2015-05-02 DIAGNOSIS — D0510 Intraductal carcinoma in situ of unspecified breast: Secondary | ICD-10-CM

## 2015-05-03 ENCOUNTER — Ambulatory Visit (HOSPITAL_BASED_OUTPATIENT_CLINIC_OR_DEPARTMENT_OTHER): Payer: Commercial Managed Care - HMO | Admitting: Hematology

## 2015-05-03 ENCOUNTER — Telehealth: Payer: Self-pay | Admitting: Hematology

## 2015-05-03 ENCOUNTER — Other Ambulatory Visit (HOSPITAL_BASED_OUTPATIENT_CLINIC_OR_DEPARTMENT_OTHER): Payer: Commercial Managed Care - HMO

## 2015-05-03 ENCOUNTER — Encounter (INDEPENDENT_AMBULATORY_CARE_PROVIDER_SITE_OTHER): Payer: Self-pay

## 2015-05-03 VITALS — BP 138/88 | HR 61 | Resp 18 | Ht 64.0 in | Wt 222.9 lb

## 2015-05-03 DIAGNOSIS — E119 Type 2 diabetes mellitus without complications: Secondary | ICD-10-CM | POA: Diagnosis not present

## 2015-05-03 DIAGNOSIS — M1990 Unspecified osteoarthritis, unspecified site: Secondary | ICD-10-CM | POA: Diagnosis not present

## 2015-05-03 DIAGNOSIS — Z853 Personal history of malignant neoplasm of breast: Secondary | ICD-10-CM

## 2015-05-03 DIAGNOSIS — R944 Abnormal results of kidney function studies: Secondary | ICD-10-CM | POA: Diagnosis not present

## 2015-05-03 DIAGNOSIS — D0510 Intraductal carcinoma in situ of unspecified breast: Secondary | ICD-10-CM

## 2015-05-03 LAB — CBC WITH DIFFERENTIAL/PLATELET
BASO%: 0.3 % (ref 0.0–2.0)
Basophils Absolute: 0 10*3/uL (ref 0.0–0.1)
EOS%: 3.5 % (ref 0.0–7.0)
Eosinophils Absolute: 0.2 10*3/uL (ref 0.0–0.5)
HEMATOCRIT: 34.9 % (ref 34.8–46.6)
HEMOGLOBIN: 10.9 g/dL — AB (ref 11.6–15.9)
LYMPH%: 34.1 % (ref 14.0–49.7)
MCH: 29.1 pg (ref 25.1–34.0)
MCHC: 31.2 g/dL — AB (ref 31.5–36.0)
MCV: 93.1 fL (ref 79.5–101.0)
MONO#: 0.6 10*3/uL (ref 0.1–0.9)
MONO%: 9.3 % (ref 0.0–14.0)
NEUT%: 52.8 % (ref 38.4–76.8)
NEUTROS ABS: 3.6 10*3/uL (ref 1.5–6.5)
Platelets: 169 10*3/uL (ref 145–400)
RBC: 3.75 10*6/uL (ref 3.70–5.45)
RDW: 16.5 % — ABNORMAL HIGH (ref 11.2–14.5)
WBC: 6.8 10*3/uL (ref 3.9–10.3)
lymph#: 2.3 10*3/uL (ref 0.9–3.3)

## 2015-05-03 LAB — COMPREHENSIVE METABOLIC PANEL (CC13)
ALBUMIN: 3.5 g/dL (ref 3.5–5.0)
ALT: 6 U/L (ref 0–55)
AST: 15 U/L (ref 5–34)
Alkaline Phosphatase: 72 U/L (ref 40–150)
Anion Gap: 10 mEq/L (ref 3–11)
BUN: 18.8 mg/dL (ref 7.0–26.0)
CHLORIDE: 102 meq/L (ref 98–109)
CO2: 26 mEq/L (ref 22–29)
Calcium: 8.5 mg/dL (ref 8.4–10.4)
Creatinine: 1.4 mg/dL — ABNORMAL HIGH (ref 0.6–1.1)
EGFR: 40 mL/min/{1.73_m2} — ABNORMAL LOW (ref >90)
Glucose: 104 mg/dl (ref 70–140)
Potassium: 4.5 mEq/L (ref 3.5–5.1)
SODIUM: 137 meq/L (ref 136–145)
Total Bilirubin: 0.9 mg/dL (ref 0.20–1.20)
Total Protein: 7.4 g/dL (ref 6.4–8.3)

## 2015-05-03 NOTE — Progress Notes (Signed)
[facility]  OFFICE PROGRESS NOTE  CC Dr. [name] Dr. [name] Dr. [name]  DIAGNOSIS:  81 year old female DCIS diagnosed 07/2010  PRIOR THERAPY: 1. 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 Tumor was ER+  2. S/p radiation therapy to the right breast completed November 2011.  3. Tamoxifen 20 mg from October 21 2010 - November 13, 2010 but discontinued due to possible DVT of the right leg.  3. Switched to [employer] 25 daily starting 12/14/11patient was discontinued due intolerance and side effects  CURRENT THERAPY: observation  INTERVAL HISTORY: [name] 81 y.o. female returns for follow up visit. She was previously under Dr. Annye Rusk care, who has left the practice.  She lives alone independently, able to do all selfcare and routine activities, she also goes outside with her family members. She denies any pain, dyspnea and GI symptoms. She has good appetite and energy level.   MEDICAL HISTORY: Past Medical History  Diagnosis Date  . 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 DVT (deep venous thrombosis)   . Arthritis   . Diabetes mellitus   . Hyperlipidemia   . Hypertension   . CHF (congestive heart failure)   . Anemia   . Cholelithiasis   . GI bleed   . Breast cancer 2012    right  . Peripheral neuropathy   . Heart murmur     ALLERGIES:  is allergic to peach.  MEDICATIONS:  Current Outpatient Prescriptions  Medication Sig Dispense Refill  . amLODipine-valsartan (EXFORGE) 5-320 MG per tablet Take 1 tablet by mouth daily.    Marland Kitchen atenolol (TENORMIN) 50 MG tablet Take 50 mg by mouth daily.    Marland Kitchen atorvastatin (LIPITOR) 20 MG tablet Take 20 mg by mouth daily.    . digoxin (LANOXIN) 0.125 MG tablet Take 125 mcg by mouth daily.    . furosemide (LASIX) 20 MG tablet Take 2 tablets (40 mg total) by mouth daily. 11/25-11/30 > take an additional 20mg  daily each  night. 60 tablet 0  . glimepiride (AMARYL) 2 MG tablet Take 2 mg by mouth daily before breakfast.    . pantoprazole (PROTONIX) 40 MG tablet Take 1 tablet (40 mg total) by mouth 2 (two) times daily before a meal. 60 tablet 3  . potassium chloride SA (K-DUR,KLOR-CON) 20 MEQ tablet Take 20 mEq by mouth 2 (two) times daily.    Marland Kitchen warfarin (COUMADIN) 2 MG tablet Take 2-4 mg by mouth daily. Takes 1 tablet daily, except takes 2 tablets on Thursdays and and Saturdays.     No current facility-administered medications for this visit.    SURGICAL HISTORY:  Past Surgical History  Procedure Laterality Date  . Breast surgery    . Esophagogastroduodenoscopy  09/01/2012    Procedure: ESOPHAGOGASTRODUODENOSCOPY (EGD);  Surgeon: Ladene Artist, MD,FACG;  Location: [facility] ENDOSCOPY;  Service: Endoscopy;  Laterality: N/A;  . Esophagogastroduodenoscopy  09/03/2012    Procedure: ESOPHAGOGASTRODUODENOSCOPY (EGD);  Surgeon: Inda Castle, MD;  Location: [place];  Service: Endoscopy;  Laterality: N/A;  . Endoscopic retrograde cholangiopancreatography (ercp) with propofol  10/14/2012    Procedure: ENDOSCOPIC RETROGRADE CHOLANGIOPANCREATOGRAPHY (ERCP) WITH PROPOFOL;  Surgeon: Milus Banister, MD;  Location: [facility] ENDOSCOPY;  Service: Endoscopy;  Laterality: N/A;  . Eus  10/14/2012    Procedure: UPPER ENDOSCOPIC ULTRASOUND (EUS) LINEAR;  Surgeon: Milus Banister, MD;  Location: [facility] ENDOSCOPY;  Service: Endoscopy;  Laterality: N/A;  . Eus  11/11/2012    Procedure: UPPER ENDOSCOPIC ULTRASOUND (EUS) LINEAR;  Surgeon: Milus Banister, MD;  Location: [facility] ENDOSCOPY;  Service: Endoscopy;  Laterality: N/A;    REVIEW OF SYSTEMS:  Pertinent items are noted in HPI.   PHYSICAL EXAMINATION:  BP 138/88 mmHg  Pulse 61  Resp 18  Ht 5\' 4"  (1.626 m)  Wt 222 lb 14.4 oz (101.107 kg)  BMI 38.24 kg/m2  SpO2 94% GENERAL:alert, no distress and comfortable; well developed and well nourished.  SKIN: skin color, texture, turgor are normal, no  rashes or significant lesions EYES: normal, Conjunctiva are pink and non-injected, sclera clear OROPHARYNX:no exudate, no erythema and lips, buccal mucosa, and tongue normal  NECK: supple, thyroid normal size, non-tender, without nodularity LYMPH:  no palpable lymphadenopathy in the cervical, axillary or supraclavicular LUNGS: clear to auscultation with normal breathing effort, no wheezes or rhonchi HEART: regular rate & rhythm and no murmurs and no lower extremity edema ABDOMEN:abdomen soft, non-tender and normal bowel sounds Musculoskeletal:no cyanosis of digits and no clubbing  NEURO: alert & oriented x 3 with fluent speech, no focal motor/sensory deficits Breasts: Breast inspection showed them to be symmetrical with no nipple discharge. Palpation of the breasts and axilla revealed no obvious mass that I could appreciate. (+) well -healed surgical scar in right breast.   ECOG PERFORMANCE STATUS: 1  Blood pressure 138/88, pulse 61, resp. rate 18, height 5\' 4"  (1.626 m), weight 222 lb 14.4 oz (101.107 kg), SpO2 94 %.  LABORATORY DATA: CBC Latest Ref Rng 05/03/2015 04/20/2014 10/31/2013  WBC 3.9 - 10.3 10e3/uL 6.8 6.6 6.4  Hemoglobin 11.6 - 15.9 g/dL 10.9(L) 11.3(L) 12.5  Hematocrit 34.8 - 46.6 % 34.9 35.5 38.2  Platelets 145 - 400 10e3/uL 169 164 146(L)    CMP Latest Ref Rng 05/03/2015 04/20/2014 10/31/2013  Glucose 70 - 140 mg/dl 104 73 94  BUN 7.0 - 26.0 mg/dL 18.8 27.4(H) 17  Creatinine 0.6 - 1.1 mg/dL 1.4(H) 1.1 0.84  Sodium 136 - 145 mEq/L 137 139 139  Potassium 3.5 - 5.1 mEq/L 4.5 5.2(H) 4.5  Chloride 96 - 112 mEq/L - - 103  CO2 22 - 29 mEq/L 26 26 26   Calcium 8.4 - 10.4 mg/dL 8.5 9.3 8.9  Total Protein 6.4 - 8.3 g/dL 7.4 7.6 7.9  Total Bilirubin 0.20 - 1.20 mg/dL 0.90 0.59 0.8  Alkaline Phos 40 - 150 U/L 72 75 92  AST 5 - 34 U/L 15 17 25   ALT 0 - 55 U/L 6 12 10       RADIOGRAPHIC STUDIES:  No results found.  ASSESSMENT AND PLAN: 81 year old female with:  1. Left  breast DCIS, ER+ -She is status post lumpectomy and adjuvant breast irradiation, she could not tolerate antiestrogen therapy. -She is doing very well on observation. -Her exam and last mammogram on 07/07/2014 showed no evidence of recurrence -I encouraged her to continue healthy diet and a regular exercise  2. Diabetes and arthritis -She'll follow-up with her family care physician  4. Elevated creatinine -I encouraged her to drink adequate fluids, avoid dehydration -She'll continue follow-up with her PCP.  Follow-up: She will return in one year with exam only.   I spent 15 minutes counseling the patient face to face. The total time spent in the appointment was 20 minutes.   Truitt Merle  05/03/2015, 3:26 PM

## 2015-05-03 NOTE — Telephone Encounter (Signed)
per pof to sch pt appt-gave pt copy of sch °

## 2015-05-04 ENCOUNTER — Encounter: Payer: Self-pay | Admitting: Hematology

## 2015-05-04 DIAGNOSIS — I482 Chronic atrial fibrillation: Secondary | ICD-10-CM | POA: Diagnosis not present

## 2015-05-08 DIAGNOSIS — H2512 Age-related nuclear cataract, left eye: Secondary | ICD-10-CM | POA: Diagnosis not present

## 2015-05-08 HISTORY — PX: CATARACT EXTRACTION: SUR2

## 2015-05-09 ENCOUNTER — Inpatient Hospital Stay (HOSPITAL_COMMUNITY)
Admission: EM | Admit: 2015-05-09 | Discharge: 2015-05-13 | DRG: 280 | Disposition: A | Discharge status: Home Health | Payer: Commercial Managed Care - HMO | Attending: Cardiology | Admitting: Cardiology

## 2015-05-09 ENCOUNTER — Emergency Department (HOSPITAL_COMMUNITY): Payer: Commercial Managed Care - HMO

## 2015-05-09 ENCOUNTER — Other Ambulatory Visit (HOSPITAL_COMMUNITY): Payer: Self-pay

## 2015-05-09 ENCOUNTER — Encounter (HOSPITAL_COMMUNITY): Payer: Self-pay

## 2015-05-09 DIAGNOSIS — G629 Polyneuropathy, unspecified: Secondary | ICD-10-CM | POA: Diagnosis present

## 2015-05-09 DIAGNOSIS — K922 Gastrointestinal hemorrhage, unspecified: Secondary | ICD-10-CM | POA: Diagnosis not present

## 2015-05-09 DIAGNOSIS — Z86718 Personal history of other venous thrombosis and embolism: Secondary | ICD-10-CM | POA: Diagnosis not present

## 2015-05-09 DIAGNOSIS — S299XXA Unspecified injury of thorax, initial encounter: Secondary | ICD-10-CM | POA: Diagnosis not present

## 2015-05-09 DIAGNOSIS — Z853 Personal history of malignant neoplasm of breast: Secondary | ICD-10-CM

## 2015-05-09 DIAGNOSIS — K264 Chronic or unspecified duodenal ulcer with hemorrhage: Secondary | ICD-10-CM | POA: Diagnosis not present

## 2015-05-09 DIAGNOSIS — Z8673 Personal history of transient ischemic attack (TIA), and cerebral infarction without residual deficits: Secondary | ICD-10-CM

## 2015-05-09 DIAGNOSIS — I4891 Unspecified atrial fibrillation: Secondary | ICD-10-CM | POA: Diagnosis not present

## 2015-05-09 DIAGNOSIS — R55 Syncope and collapse: Secondary | ICD-10-CM

## 2015-05-09 DIAGNOSIS — I482 Chronic atrial fibrillation: Secondary | ICD-10-CM | POA: Diagnosis not present

## 2015-05-09 DIAGNOSIS — I509 Heart failure, unspecified: Secondary | ICD-10-CM | POA: Diagnosis not present

## 2015-05-09 DIAGNOSIS — E119 Type 2 diabetes mellitus without complications: Secondary | ICD-10-CM | POA: Diagnosis present

## 2015-05-09 DIAGNOSIS — I5041 Acute combined systolic (congestive) and diastolic (congestive) heart failure: Secondary | ICD-10-CM | POA: Diagnosis present

## 2015-05-09 DIAGNOSIS — I501 Left ventricular failure: Secondary | ICD-10-CM | POA: Diagnosis not present

## 2015-05-09 DIAGNOSIS — E785 Hyperlipidemia, unspecified: Secondary | ICD-10-CM | POA: Diagnosis not present

## 2015-05-09 DIAGNOSIS — E780 Pure hypercholesterolemia: Secondary | ICD-10-CM | POA: Diagnosis present

## 2015-05-09 DIAGNOSIS — I10 Essential (primary) hypertension: Secondary | ICD-10-CM | POA: Diagnosis present

## 2015-05-09 DIAGNOSIS — R0902 Hypoxemia: Secondary | ICD-10-CM | POA: Diagnosis not present

## 2015-05-09 DIAGNOSIS — N390 Urinary tract infection, site not specified: Secondary | ICD-10-CM | POA: Diagnosis not present

## 2015-05-09 DIAGNOSIS — I214 Non-ST elevation (NSTEMI) myocardial infarction: Secondary | ICD-10-CM | POA: Diagnosis not present

## 2015-05-09 DIAGNOSIS — Z79899 Other long term (current) drug therapy: Secondary | ICD-10-CM | POA: Diagnosis not present

## 2015-05-09 DIAGNOSIS — I2510 Atherosclerotic heart disease of native coronary artery without angina pectoris: Secondary | ICD-10-CM | POA: Diagnosis present

## 2015-05-09 DIAGNOSIS — R0989 Other specified symptoms and signs involving the circulatory and respiratory systems: Secondary | ICD-10-CM | POA: Diagnosis not present

## 2015-05-09 DIAGNOSIS — R748 Abnormal levels of other serum enzymes: Secondary | ICD-10-CM | POA: Diagnosis not present

## 2015-05-09 DIAGNOSIS — R778 Other specified abnormalities of plasma proteins: Secondary | ICD-10-CM

## 2015-05-09 DIAGNOSIS — R404 Transient alteration of awareness: Secondary | ICD-10-CM | POA: Diagnosis not present

## 2015-05-09 DIAGNOSIS — I5022 Chronic systolic (congestive) heart failure: Secondary | ICD-10-CM | POA: Diagnosis not present

## 2015-05-09 DIAGNOSIS — I5043 Acute on chronic combined systolic (congestive) and diastolic (congestive) heart failure: Secondary | ICD-10-CM | POA: Diagnosis present

## 2015-05-09 DIAGNOSIS — R7989 Other specified abnormal findings of blood chemistry: Secondary | ICD-10-CM

## 2015-05-09 DIAGNOSIS — Z7901 Long term (current) use of anticoagulants: Secondary | ICD-10-CM

## 2015-05-09 DIAGNOSIS — Z91018 Allergy to other foods: Secondary | ICD-10-CM | POA: Diagnosis not present

## 2015-05-09 HISTORY — DX: Syncope and collapse: R55

## 2015-05-09 HISTORY — DX: Reserved for inherently not codable concepts without codable children: IMO0001

## 2015-05-09 HISTORY — DX: Gastro-esophageal reflux disease without esophagitis: K21.9

## 2015-05-09 LAB — CBC WITH DIFFERENTIAL/PLATELET
BASOS ABS: 0 10*3/uL (ref 0.0–0.1)
BASOS ABS: 0 10*3/uL (ref 0.0–0.1)
Basophils Relative: 0 % (ref 0–1)
Basophils Relative: 0 % (ref 0–1)
EOS ABS: 0.1 10*3/uL (ref 0.0–0.7)
Eosinophils Absolute: 0.1 10*3/uL (ref 0.0–0.7)
Eosinophils Relative: 1 % (ref 0–5)
Eosinophils Relative: 1 % (ref 0–5)
HCT: 33.7 % — ABNORMAL LOW (ref 36.0–46.0)
HCT: 36.4 % (ref 36.0–46.0)
HEMOGLOBIN: 10.3 g/dL — AB (ref 12.0–15.0)
Hemoglobin: 11.1 g/dL — ABNORMAL LOW (ref 12.0–15.0)
LYMPHS PCT: 30 % (ref 12–46)
Lymphocytes Relative: 28 % (ref 12–46)
Lymphs Abs: 2 10*3/uL (ref 0.7–4.0)
Lymphs Abs: 2.1 10*3/uL (ref 0.7–4.0)
MCH: 28.2 pg (ref 26.0–34.0)
MCH: 28.3 pg (ref 26.0–34.0)
MCHC: 30.6 g/dL (ref 30.0–36.0)
MCHC: 30.5 g/dL (ref 30.0–36.0)
MCV: 92.3 fL (ref 78.0–100.0)
MCV: 92.9 fL (ref 78.0–100.0)
Monocytes Absolute: 0.5 10*3/uL (ref 0.1–1.0)
Monocytes Absolute: 0.8 10*3/uL (ref 0.1–1.0)
Monocytes Relative: 7 % (ref 3–12)
Monocytes Relative: 11 % (ref 3–12)
NEUTROS ABS: 4.4 10*3/uL (ref 1.7–7.7)
NEUTROS PCT: 64 % (ref 43–77)
NEUTROS PCT: 58 % (ref 43–77)
Neutro Abs: 4 10*3/uL (ref 1.7–7.7)
Platelets: 172 10*3/uL (ref 150–400)
Platelets: 171 10*3/uL (ref 150–400)
RBC: 3.65 MIL/uL — AB (ref 3.87–5.11)
RBC: 3.92 MIL/uL (ref 3.87–5.11)
RDW: 16.9 % — AB (ref 11.5–15.5)
RDW: 16.9 % — ABNORMAL HIGH (ref 11.5–15.5)
WBC: 7 10*3/uL (ref 4.0–10.5)
WBC: 7 10*3/uL (ref 4.0–10.5)

## 2015-05-09 LAB — COMPREHENSIVE METABOLIC PANEL
ALBUMIN: 3.6 g/dL (ref 3.5–5.0)
ALK PHOS: 72 U/L (ref 38–126)
ALT: 12 U/L — ABNORMAL LOW (ref 14–54)
ALT: 12 U/L — AB (ref 14–54)
AST: 22 U/L (ref 15–41)
AST: 25 U/L (ref 15–41)
Albumin: 3.7 g/dL (ref 3.5–5.0)
Alkaline Phosphatase: 74 U/L (ref 38–126)
Anion gap: 9 (ref 5–15)
Anion gap: 9 (ref 5–15)
BILIRUBIN TOTAL: 0.7 mg/dL (ref 0.3–1.2)
BUN: 19 mg/dL (ref 6–20)
BUN: 18 mg/dL (ref 6–20)
CO2: 29 mmol/L (ref 22–32)
CO2: 29 mmol/L (ref 22–32)
CREATININE: 1.4 mg/dL — AB (ref 0.44–1.00)
Calcium: 8.7 mg/dL — ABNORMAL LOW (ref 8.9–10.3)
Calcium: 8.7 mg/dL — ABNORMAL LOW (ref 8.9–10.3)
Chloride: 101 mmol/L (ref 101–111)
Chloride: 101 mmol/L (ref 101–111)
Creatinine, Ser: 1.53 mg/dL — ABNORMAL HIGH (ref 0.44–1.00)
GFR calc Af Amer: 34 mL/min — ABNORMAL LOW (ref >60)
GFR calc Af Amer: 38 mL/min — ABNORMAL LOW (ref >60)
GFR calc non Af Amer: 30 mL/min — ABNORMAL LOW (ref >60)
GFR, EST NON AFRICAN AMERICAN: 33 mL/min — AB (ref >60)
GLUCOSE: 106 mg/dL — AB (ref 65–99)
GLUCOSE: 66 mg/dL (ref 65–99)
POTASSIUM: 4.9 mmol/L (ref 3.5–5.1)
Potassium: 4.9 mmol/L (ref 3.5–5.1)
SODIUM: 139 mmol/L (ref 135–145)
SODIUM: 139 mmol/L (ref 135–145)
TOTAL PROTEIN: 7.7 g/dL (ref 6.5–8.1)
TOTAL PROTEIN: 7.6 g/dL (ref 6.5–8.1)
Total Bilirubin: 0.7 mg/dL (ref 0.3–1.2)

## 2015-05-09 LAB — URINALYSIS, ROUTINE W REFLEX MICROSCOPIC
Bilirubin Urine: NEGATIVE
Glucose, UA: NEGATIVE mg/dL
Hgb urine dipstick: NEGATIVE
KETONES UR: NEGATIVE mg/dL
Leukocytes, UA: NEGATIVE
Nitrite: NEGATIVE
PH: 5 (ref 5.0–8.0)
Protein, ur: NEGATIVE mg/dL
SPECIFIC GRAVITY, URINE: 1.017 (ref 1.005–1.030)
Urobilinogen, UA: 0.2 mg/dL (ref 0.0–1.0)

## 2015-05-09 LAB — TROPONIN I
TROPONIN I: 0.21 ng/mL — AB (ref <0.031)
TROPONIN I: 0.17 ng/mL — AB (ref <0.031)
TROPONIN I: 0.13 ng/mL — AB (ref <0.031)
Troponin I: 0.15 ng/mL — ABNORMAL HIGH (ref <0.031)

## 2015-05-09 LAB — TSH: TSH: 0.822 u[IU]/mL (ref 0.350–4.500)

## 2015-05-09 LAB — GLUCOSE, CAPILLARY
GLUCOSE-CAPILLARY: 72 mg/dL (ref 65–99)
GLUCOSE-CAPILLARY: 75 mg/dL (ref 65–99)
GLUCOSE-CAPILLARY: 95 mg/dL (ref 65–99)
GLUCOSE-CAPILLARY: 76 mg/dL (ref 65–99)
Glucose-Capillary: 65 mg/dL (ref 65–99)

## 2015-05-09 LAB — BRAIN NATRIURETIC PEPTIDE
B Natriuretic Peptide: 674.8 pg/mL — ABNORMAL HIGH (ref 0.0–100.0)
B Natriuretic Peptide: 661.2 pg/mL — ABNORMAL HIGH (ref 0.0–100.0)

## 2015-05-09 LAB — MAGNESIUM: MAGNESIUM: 2.2 mg/dL (ref 1.7–2.4)

## 2015-05-09 LAB — DIGOXIN LEVEL: Digoxin Level: 1.3 ng/mL (ref 0.8–2.0)

## 2015-05-09 LAB — I-STAT TROPONIN, ED: Troponin i, poc: 0.16 ng/mL (ref 0.00–0.08)

## 2015-05-09 LAB — PROTIME-INR
INR: 3.04 — AB (ref 0.00–1.49)
INR: 2.71 — ABNORMAL HIGH (ref 0.00–1.49)
Prothrombin Time: 30.9 seconds — ABNORMAL HIGH (ref 11.6–15.2)
Prothrombin Time: 28.4 seconds — ABNORMAL HIGH (ref 11.6–15.2)

## 2015-05-09 MED ORDER — SODIUM CHLORIDE 0.9 % IJ SOLN
3.0000 mL | INTRAMUSCULAR | Status: DC | PRN
  Administered 2015-05-10: 3 mL via INTRAVENOUS
  Filled 2015-05-09: qty 3

## 2015-05-09 MED ORDER — LOSARTAN POTASSIUM 50 MG PO TABS
50.0000 mg | ORAL_TABLET | Freq: Every day | ORAL | Status: DC
  Administered 2015-05-09 – 2015-05-13 (×5): 50 mg via ORAL
  Filled 2015-05-09 (×6): qty 1

## 2015-05-09 MED ORDER — POTASSIUM CHLORIDE CRYS ER 20 MEQ PO TBCR
20.0000 meq | EXTENDED_RELEASE_TABLET | Freq: Two times a day (BID) | ORAL | Status: DC
  Administered 2015-05-09 (×2): 20 meq via ORAL
  Filled 2015-05-09 (×4): qty 1

## 2015-05-09 MED ORDER — SODIUM CHLORIDE 0.9 % IJ SOLN
3.0000 mL | Freq: Two times a day (BID) | INTRAMUSCULAR | Status: DC
  Administered 2015-05-09 – 2015-05-13 (×6): 3 mL via INTRAVENOUS
  Filled 2015-05-09: qty 3

## 2015-05-09 MED ORDER — ONDANSETRON HCL 4 MG/2ML IJ SOLN: 4.0000 mg | Freq: Four times a day (QID) | INTRAMUSCULAR | Status: DC | PRN

## 2015-05-09 MED ORDER — INSULIN ASPART 100 UNIT/ML ~~LOC~~ SOLN
0.0000 [IU] | Freq: Three times a day (TID) | SUBCUTANEOUS | Status: DC
Start: 2015-05-09 — End: 2015-05-13

## 2015-05-09 MED ORDER — METOPROLOL TARTRATE 12.5 MG HALF TABLET
12.5000 mg | ORAL_TABLET | Freq: Two times a day (BID) | ORAL | Status: DC
  Administered 2015-05-09 – 2015-05-13 (×7): 12.5 mg via ORAL
  Filled 2015-05-09 (×11): qty 1

## 2015-05-09 MED ORDER — OFLOXACIN 0.3 % OP SOLN
1.0000 [drp] | OPHTHALMIC | Status: DC
  Administered 2015-05-09 – 2015-05-13 (×34): 1 [drp] via OPHTHALMIC
  Filled 2015-05-09: qty 5

## 2015-05-09 MED ORDER — CEFTRIAXONE SODIUM IN DEXTROSE 20 MG/ML IV SOLN
1.0000 g | INTRAVENOUS | Status: DC
  Administered 2015-05-09 – 2015-05-12 (×4): 1 g via INTRAVENOUS
  Filled 2015-05-09 (×5): qty 50

## 2015-05-09 MED ORDER — DIGOXIN 125 MCG PO TABS
125.0000 ug | ORAL_TABLET | Freq: Every day | ORAL | Status: DC
  Administered 2015-05-09 – 2015-05-10 (×2): 125 ug via ORAL
  Filled 2015-05-09 (×2): qty 1

## 2015-05-09 MED ORDER — PANTOPRAZOLE SODIUM 40 MG PO TBEC
40.0000 mg | DELAYED_RELEASE_TABLET | Freq: Two times a day (BID) | ORAL | Status: DC
  Administered 2015-05-09 – 2015-05-13 (×7): 40 mg via ORAL
  Filled 2015-05-09 (×7): qty 1

## 2015-05-09 MED ORDER — SODIUM CHLORIDE 0.9 % IV SOLN: 250.0000 mL | INTRAVENOUS | Status: DC | PRN

## 2015-05-09 MED ORDER — ATORVASTATIN CALCIUM 20 MG PO TABS
20.0000 mg | ORAL_TABLET | Freq: Every day | ORAL | Status: DC
  Administered 2015-05-09 – 2015-05-13 (×5): 20 mg via ORAL
  Filled 2015-05-09 (×6): qty 1

## 2015-05-09 MED ORDER — PREDNISOLONE ACETATE 1 % OP SUSP
1.0000 [drp] | OPHTHALMIC | Status: DC
  Administered 2015-05-09 – 2015-05-13 (×34): 1 [drp] via OPHTHALMIC
  Filled 2015-05-09: qty 1

## 2015-05-09 MED ORDER — ACETAMINOPHEN 325 MG PO TABS
650.0000 mg | ORAL_TABLET | ORAL | Status: DC | PRN
  Administered 2015-05-10: 650 mg via ORAL
  Filled 2015-05-09: qty 2

## 2015-05-09 MED ORDER — ASPIRIN EC 81 MG PO TBEC
81.0000 mg | DELAYED_RELEASE_TABLET | Freq: Every day | ORAL | Status: DC
  Administered 2015-05-09 – 2015-05-13 (×5): 81 mg via ORAL
  Filled 2015-05-09 (×5): qty 1

## 2015-05-09 MED ORDER — KETOROLAC TROMETHAMINE 0.5 % OP SOLN
1.0000 [drp] | OPHTHALMIC | Status: DC
  Administered 2015-05-09 – 2015-05-13 (×34): 1 [drp] via OPHTHALMIC
  Filled 2015-05-09: qty 3

## 2015-05-09 MED ORDER — ASPIRIN EC 81 MG PO TBEC: 81.0000 mg | DELAYED_RELEASE_TABLET | Freq: Every day | ORAL | Status: DC

## 2015-05-09 MED ORDER — FUROSEMIDE 10 MG/ML IJ SOLN
40.0000 mg | Freq: Every day | INTRAMUSCULAR | Status: DC
  Administered 2015-05-09 – 2015-05-13 (×4): 40 mg via INTRAVENOUS
  Filled 2015-05-09 (×6): qty 4

## 2015-05-09 NOTE — H&P (Signed)
[name] is an 81 y.o. female.   Chief Complaint: Recurrent syncope HPI: Patient is 81 year old female with past medical history significant for hypertension, diabetes mellitus, chronic atrial fibrillation, history of congestive heart failure secondary to preserved LV systolic function, history of DVT, history of CVA of breast, history of DVT in the past, and degenerative joint disease, hypercholesteremia, came to the ER by EMS following syncopal episode while on toilet seat. Patient denies any palpitation lightheadedness or chest pain prior to syncopal episode. Patient also gives history of exertional dyspnea with minimal exertion associated with feeling weak tired fatigued and no energy for last few weeks. Patient also had similar syncopal episode on last Friday but did not seek any medical attention. Patient denies any weakness in the arms or legs denies any seizure activity. Patient had left eye cataract extraction yesterday. EKG done in the ER showed A. fib with controlled ventricular response and ST-T wave changes in inferolateral leads and was noted to be in mild congestive heart failure with minimally elevated troponin I. Patient recently had nuclear stress test in February 2016 which showed no evidence of ischemia with mildly depressed LV systolic function EF of 15% as compared to 71% from prior nuclear stress test.  Past Medical History  Diagnosis Date  . 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 DVT (deep venous thrombosis)   . Arthritis   . Diabetes mellitus   . Hyperlipidemia   . Hypertension   . CHF (congestive heart failure)   . Anemia   . Cholelithiasis   . GI bleed   . Breast cancer 2012    right  . Peripheral neuropathy   . Heart murmur     Past Surgical History  Procedure Laterality Date  . Breast surgery    . Esophagogastroduodenoscopy  09/01/2012    Procedure: ESOPHAGOGASTRODUODENOSCOPY (EGD);  Surgeon:  Ladene Artist, MD,FACG;  Location: [facility] ENDOSCOPY;  Service: Endoscopy;  Laterality: N/A;  . Esophagogastroduodenoscopy  09/03/2012    Procedure: ESOPHAGOGASTRODUODENOSCOPY (EGD);  Surgeon: Inda Castle, MD;  Location: [place];  Service: Endoscopy;  Laterality: N/A;  . Endoscopic retrograde cholangiopancreatography (ercp) with propofol  10/14/2012    Procedure: ENDOSCOPIC RETROGRADE CHOLANGIOPANCREATOGRAPHY (ERCP) WITH PROPOFOL;  Surgeon: Milus Banister, MD;  Location: [facility] ENDOSCOPY;  Service: Endoscopy;  Laterality: N/A;  . Eus  10/14/2012    Procedure: UPPER ENDOSCOPIC ULTRASOUND (EUS) LINEAR;  Surgeon: Milus Banister, MD;  Location: [facility] ENDOSCOPY;  Service: Endoscopy;  Laterality: N/A;  . Eus  11/11/2012    Procedure: UPPER ENDOSCOPIC ULTRASOUND (EUS) LINEAR;  Surgeon: Milus Banister, MD;  Location: [facility] ENDOSCOPY;  Service: Endoscopy;  Laterality: N/A;    No family history on file. Social History:  reports that she has never smoked. She has never used smokeless tobacco. She reports that she does not drink alcohol or use illicit drugs.  Allergies:  Allergies  Allergen Reactions  . Peach [Prunus Persica] Rash    AGENT: peach fuzz     (Not in a hospital admission)  Results for orders placed or performed during the hospital encounter of 05/09/15 (from the past 48 hour(s))  CBC with Differential/Platelet     Status: Abnormal   Collection Time: 05/09/15  3:19 AM  Result Value Ref Range   WBC 7.0 4.0 - 10.5 K/uL   RBC 3.65 (L) 3.87 - 5.11 MIL/uL   Hemoglobin 10.3 (L) 12.0 - 15.0 g/dL  HCT 33.7 (L) 36.0 - 46.0 %   MCV 92.3 78.0 - 100.0 fL   MCH 28.2 26.0 - 34.0 pg   MCHC 30.6 30.0 - 36.0 g/dL   RDW 16.9 (H) 11.5 - 15.5 %   Platelets 172 150 - 400 K/uL   Neutrophils Relative % 64 43 - 77 %   Neutro Abs 4.4 1.7 - 7.7 K/uL   Lymphocytes Relative 28 12 - 46 %   Lymphs Abs 2.0 0.7 - 4.0 K/uL   Monocytes Relative 7 3 - 12 %   Monocytes Absolute 0.5 0.1 - 1.0 K/uL   Eosinophils  Relative 1 0 - 5 %   Eosinophils Absolute 0.1 0.0 - 0.7 K/uL   Basophils Relative 0 0 - 1 %   Basophils Absolute 0.0 0.0 - 0.1 K/uL  Comprehensive metabolic panel     Status: Abnormal   Collection Time: 05/09/15  3:19 AM  Result Value Ref Range   Sodium 139 135 - 145 mmol/L   Potassium 4.9 3.5 - 5.1 mmol/L   Chloride 101 101 - 111 mmol/L   CO2 29 22 - 32 mmol/L   Glucose, Bld 106 (H) 65 - 99 mg/dL   BUN 19 6 - 20 mg/dL   Creatinine, Ser 1.53 (H) 0.44 - 1.00 mg/dL   Calcium 8.7 (L) 8.9 - 10.3 mg/dL   Total Protein 7.7 6.5 - 8.1 g/dL   Albumin 3.6 3.5 - 5.0 g/dL   AST 22 15 - 41 U/L   ALT 12 (L) 14 - 54 U/L   Alkaline Phosphatase 74 38 - 126 U/L   Total Bilirubin 0.7 0.3 - 1.2 mg/dL   GFR calc non Af Amer 30 (L) >60 mL/min   GFR calc Af Amer 34 (L) >60 mL/min    Comment: (NOTE) The eGFR has been calculated using the CKD EPI equation. This calculation has not been validated in all clinical situations. eGFR's persistently <60 mL/min signify possible Chronic Kidney Disease.    Anion gap 9 5 - 15  Protime-INR     Status: Abnormal   Collection Time: 05/09/15  3:19 AM  Result Value Ref Range   Prothrombin Time 30.9 (H) 11.6 - 15.2 seconds   INR 3.04 (H) 0.00 - 1.49  Brain natriuretic peptide     Status: Abnormal   Collection Time: 05/09/15  3:19 AM  Result Value Ref Range   B Natriuretic Peptide 674.8 (H) 0.0 - 100.0 pg/mL  I-stat troponin, ED     Status: Abnormal   Collection Time: 05/09/15  3:23 AM  Result Value Ref Range   Troponin i, poc 0.16 (HH) 0.00 - 0.08 ng/mL   Comment NOTIFIED PHYSICIAN    Comment 3            Comment: Due to the release kinetics of cTnI, a negative result within the first hours of the onset of symptoms does not rule out myocardial infarction with certainty. If myocardial infarction is still suspected, repeat the test at appropriate intervals.    Dg Chest 2 View  05/09/2015   CLINICAL DATA:  [place] [address] off toilet, syncopal episode.  EXAM: CHEST  2  VIEW  COMPARISON:  Chest radiograph October 31, 2013  FINDINGS: The cardiac silhouette appears moderately enlarged, tortuous calcified aorta. Increased diffuse interstitial prominence with pulmonary vascular congestion. Elevated RIGHT hemidiaphragm. No pleural effusion. No focal consolidation. No pneumothorax. Patient is osteopenic. Soft tissue planes are nonsuspicious.  IMPRESSION: Stable to worsening cardiomegaly. Pulmonary vascular congestion and interstitial prominence  consistent with pulmonary edema without focal consolidation.   Electronically Signed   By: Elon Alas M.D.   On: 05/09/2015 04:05    Review of Systems  Constitutional: Negative for fever and chills.  Eyes: Negative for double vision and photophobia.  Respiratory: Positive for shortness of breath. Negative for cough, hemoptysis and sputum production.   Cardiovascular: Positive for leg swelling. Negative for chest pain, palpitations and PND.  Gastrointestinal: Negative for nausea, vomiting and abdominal pain.  Genitourinary: Negative for dysuria.  Neurological: Positive for dizziness. Negative for headaches.    Blood pressure 126/84, pulse 59, temperature 98.6 F (37 C), resp. rate 20, height 5' 4"  (1.626 m), weight 100.699 kg (222 lb), SpO2 93 %. Physical Exam  Constitutional: She is oriented to person, place, and time.  HENT:  Head: Normocephalic and atraumatic.  Eyes: Conjunctivae are normal. Left eye exhibits no discharge. No scleral icterus.  Neck: Normal range of motion. Neck supple. JVD present. No thyromegaly present.  Cardiovascular:  Irregularly irregular C2-B8 soft soft systolic murmur and S3 gallop noted  Respiratory:  Decrease breath sounds at bases with bibasilar Rales  GI: Soft. Bowel sounds are normal. She exhibits no distension. There is no tenderness. There is no rebound.  Musculoskeletal:  No clubbing cyanosis 1+ edema noted  Neurological: She is alert and oriented to person, place, and time.      Assessment/Plan Recurrent syncope rule out cardiac arrhythmias Chronic atrial fibrillation Mild decompensated congestive heart failure Probable very small non-Q-wave MI with atypical presentation Hypertension Diabetes mellitus Hypercholesteremia History of DVT in the past History of CA of breast Degenerative joint disease Status post left cataract surgery History of upper GI bleed in the past Plan As per orders We'll start heparin once INR below 2. Will discuss with patient and family regarding medical management versus invasive therapy once fully compensated and depending on troponin trend.  [name] 05/09/2015, 7:54 AM

## 2015-05-09 NOTE — ED Provider Notes (Signed)
CSN: [id number]     Arrival date & time 05/09/15  0252 History   First MD Initiated Contact with Patient 05/09/15 [phone]     Chief Complaint  Patient presents with  . Loss of Consciousness     (Consider location/radiation/quality/duration/timing/severity/associated sxs/prior Treatment) Patient is a 81 y.o. female presenting with syncope. The history is provided by the patient.  Loss of Consciousness Episode history:  Multiple Most recent episode:  Yesterday Duration: seconds to minutes. Timing:  Intermittent Progression:  Unchanged Chronicity:  New Context: urination   Witnessed: yes   Relieved by:  Nothing Worsened by:  Nothing tried Ineffective treatments:  None tried Associated symptoms: shortness of breath   Associated symptoms: no chest pain, no dizziness, no fever, no headaches, no nausea and no vomiting     Past Medical History  Diagnosis Date  . 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 DVT (deep venous thrombosis)   . Arthritis   . Diabetes mellitus   . Hyperlipidemia   . Hypertension   . CHF (congestive heart failure)   . Anemia   . Cholelithiasis   . GI bleed   . Breast cancer 2012    right  . Peripheral neuropathy   . Heart murmur    Past Surgical History  Procedure Laterality Date  . Breast surgery    . Esophagogastroduodenoscopy  09/01/2012    Procedure: ESOPHAGOGASTRODUODENOSCOPY (EGD);  Surgeon: Ladene Artist, MD,FACG;  Location: [facility]COPY;  Service: Endoscopy;  Laterality: N/A;  . Esophagogastroduodenoscopy  09/03/2012    Procedure: ESOPHAGOGASTRODUODENOSCOPY (EGD);  Surgeon: Inda Castle, MD;  Location: [facility];  Service: Endoscopy;  Laterality: N/A;  . Endoscopic retrograde cholangiopancreatography (ercp) with propofol  10/14/2012    Procedure: ENDOSCOPIC RETROGRADE CHOLANGIOPANCREATOGRAPHY (ERCP) WITH PROPOFOL;  Surgeon: Milus Banister, MD;  Location: [facility] ENDOSCOPY;  Service:  Endoscopy;  Laterality: N/A;  . Eus  10/14/2012    Procedure: UPPER ENDOSCOPIC ULTRASOUND (EUS) LINEAR;  Surgeon: Milus Banister, MD;  Location: [facility] ENDOSCOPY;  Service: Endoscopy;  Laterality: N/A;  . Eus  11/11/2012    Procedure: UPPER ENDOSCOPIC ULTRASOUND (EUS) LINEAR;  Surgeon: Milus Banister, MD;  Location: [facility];  Service: Endoscopy;  Laterality: N/A;   No family history on file. History  Substance Use Topics  . Smoking status: Never Smoker   . Smokeless tobacco: Never Used  . Alcohol Use: No   OB History    No data available     Review of Systems  Constitutional: Negative for fever and fatigue.  HENT: Negative for congestion and drooling.   Eyes: Negative for pain.  Respiratory: Positive for shortness of breath. Negative for cough.   Cardiovascular: Positive for syncope. Negative for chest pain.  Gastrointestinal: Negative for nausea, vomiting, abdominal pain and diarrhea.  Genitourinary: Negative for dysuria and hematuria.  Musculoskeletal: Negative for back pain, gait problem and neck pain.  Skin: Negative for color change.  Neurological: Positive for syncope. Negative for dizziness and headaches.  Hematological: Negative for adenopathy.  Psychiatric/Behavioral: Negative for behavioral problems.  All other systems reviewed and are negative.     Allergies  [place]  Home Medications   Prior to Admission medications   Medication Sig Start Date End Date Taking? Authorizing Provider  amLODipine-valsartan (EXFORGE) 5-320 MG per tablet Take 1 tablet by mouth daily.    Historical Provider, MD  atenolol (TENORMIN) 50 MG tablet Take 50 mg by mouth daily.  Historical Provider, MD  atorvastatin (LIPITOR) 20 MG tablet Take 20 mg by mouth daily.    Historical Provider, MD  digoxin (LANOXIN) 0.125 MG tablet Take 125 mcg by mouth daily.    Historical Provider, MD  furosemide (LASIX) 20 MG tablet Take 20 mg by mouth as directed. Takes 40 mg in the AM,  20 mg in the PM.     Historical Provider, MD  gabapentin (NEURONTIN) 300 MG capsule Take 300 mg by mouth at bedtime. 04/09/15   Historical Provider, MD  glimepiride (AMARYL) 2 MG tablet Take 2 mg by mouth daily before breakfast.    Historical Provider, MD  ketorolac (ACULAR) 0.5 % ophthalmic solution  04/24/15   Historical Provider, MD  ofloxacin (OCUFLOX) 0.3 % ophthalmic solution  04/24/15   Historical Provider, MD  pantoprazole (PROTONIX) 40 MG tablet Take 1 tablet (40 mg total) by mouth 2 (two) times daily before a meal. 09/07/12   [name], MD  potassium chloride SA (K-DUR,KLOR-CON) 20 MEQ tablet Take 20 mEq by mouth 2 (two) times daily.    Historical Provider, MD  warfarin (COUMADIN) 2 MG tablet Take 2-4 mg by mouth daily. Takes 1 tablet daily, except takes 2 tablets on Thursdays and and Saturdays.    Historical Provider, MD   BP 124/59 mmHg  Pulse 62  Temp(Src) 98.6 F (37 C)  Resp 21  Ht 5\' 4"  (1.626 m)  Wt 222 lb (100.699 kg)  BMI 38.09 kg/m2  SpO2 100% Physical Exam  Constitutional: She is oriented to person, place, and time. She appears well-developed and well-nourished.  HENT:  Head: Normocephalic.  Mouth/Throat: Oropharynx is clear and moist. No oropharyngeal exudate.  Eyes: Conjunctivae and EOM are normal. Pupils are equal, round, and reactive to light.  Neck: Normal range of motion. Neck supple.  No vertebral ttp.   Cardiovascular: Normal rate, regular rhythm, normal heart sounds and intact distal pulses.  Exam reveals no gallop and no friction rub.   No murmur heard. Pulmonary/Chest: Effort normal and breath sounds normal. No respiratory distress. She has no wheezes.  Abdominal: Soft. Bowel sounds are normal. There is no tenderness. There is no rebound and no guarding.  Musculoskeletal: Normal range of motion. She exhibits edema (mild to moderate pitting edema distal bilateral lower extremity.). She exhibits no tenderness.  Neurological: She is alert and oriented to person, place, and time.   alert, oriented x3 speech: normal in context and clarity memory: intact grossly cranial nerves II-XII: intact motor strength: full proximally and distally no involuntary movements or tremors sensation: intact to light touch diffusely  cerebellar: finger-to-nose intact   Skin: Skin is warm and dry.  Psychiatric: She has a normal mood and affect. Her behavior is normal.  Nursing note and vitals reviewed.   ED Course  Procedures (including critical care time) Labs Review Labs Reviewed  CBC WITH DIFFERENTIAL/PLATELET - Abnormal; Notable for the following:    RBC 3.65 (*)    Hemoglobin 10.3 (*)    HCT 33.7 (*)    RDW 16.9 (*)    All other components within normal limits  COMPREHENSIVE METABOLIC PANEL - Abnormal; Notable for the following:    Glucose, Bld 106 (*)    Creatinine, Ser 1.53 (*)    Calcium 8.7 (*)    ALT 12 (*)    GFR calc non Af Amer 30 (*)    GFR calc Af Amer 34 (*)    All other components within normal limits  PROTIME-INR - Abnormal; Notable  for the following:    Prothrombin Time 30.9 (*)    INR 3.04 (*)    All other components within normal limits  I-STAT TROPOININ, ED - Abnormal; Notable for the following:    Troponin i, poc 0.16 (*)    All other components within normal limits  URINALYSIS, ROUTINE W REFLEX MICROSCOPIC (NOT AT [facility])  BRAIN NATRIURETIC PEPTIDE    Imaging Review Dg Chest 2 View  05/09/2015   CLINICAL DATA:  [address] off toilet, syncopal episode.  EXAM: CHEST  2 VIEW  COMPARISON:  Chest radiograph October 31, 2013  FINDINGS: The cardiac silhouette appears moderately enlarged, tortuous calcified aorta. Increased diffuse interstitial prominence with pulmonary vascular congestion. Elevated RIGHT hemidiaphragm. No pleural effusion. No focal consolidation. No pneumothorax. Patient is osteopenic. Soft tissue planes are nonsuspicious.  IMPRESSION: Stable to worsening cardiomegaly. Pulmonary vascular congestion and interstitial prominence consistent with  pulmonary edema without focal consolidation.   Electronically Signed   By: Elon Alas M.D.   On: 05/09/2015 04:05     EKG Interpretation   Date/Time:  Wednesday May 09 2015 03:01:39 EDT Ventricular Rate:  64 PR Interval:  87 QRS Duration: 78 QT Interval:  386 QTC Calculation: 398 R Axis:   85 Text Interpretation:  Unknown rhythm, irregular rate Short PR interval  Consider right atrial enlargement Borderline right axis deviation Repol  abnrm suggests ischemia, lateral leads No significant change since last  tracing Confirmed by Samella Lucchetti  MD, Arianne Klinge ([id number]) on 05/09/2015 3:41:39 AM     CRITICAL CARE Performed by: Pamella Pert, S Total critical care time: 30 min Critical care time was exclusive of separately billable procedures and treating other patients. Critical care was necessary to treat or prevent imminent or life-threatening deterioration. Critical care was time spent personally by me on the following activities: development of treatment plan with patient and/or surrogate as well as nursing, discussions with consultants, evaluation of patient's response to treatment, examination of patient, obtaining history from patient or surrogate, ordering and performing treatments and interventions, ordering and review of laboratory studies, ordering and review of radiographic studies, pulse oximetry and re-evaluation of patient's condition.  MDM   Final diagnoses:  Syncope, unspecified syncope type  Elevated troponin  Pulmonary edema with congestive heart failure    4:50 AM 81 y.o. female w hx of DVT on coumadin, DM, HLP, HTN, CHF on digoxin who presents with a 2 syncopal episodes which occurred yesterday. The first occurred while sitting on the toilet and the patient syncopized and had difficulty regaining consciousness for about 15-20 minutes. The second episode was around midnight tonight and also while sitting on the toilet. Of note she was given some type of sedation during  a left cataract procedure which occurred yesterday morning. She denies any chest pain on my exam. She denies any headaches. She states that she has chronic shortness of breath and lower extremity edema which has worsened over the last few months. She has no complaints at this time. O2 sat dipped down to the upper 80s during my exam. Vital signs otherwise unremarkable. We'll plan on admission for observation.  Discussed w/ Dr. Terrence Dupont who will admit.    Pamella Pert, MD 05/09/15 [phone]

## 2015-05-09 NOTE — ED Notes (Signed)
Dr. Terrence Dupont paged and updated on pt's urine output. No changes in orders.

## 2015-05-09 NOTE — ED Notes (Signed)
[name]  [phone].  Call grand-daughter if status changes with patient.

## 2015-05-09 NOTE — ED Notes (Signed)
Card MD at bedside.

## 2015-05-09 NOTE — Progress Notes (Signed)
ED report received at 1145 and pt arrived to the unit at 1225 via stretcher with belongings and family to the side. VSS; telemetry applied and verified; pt oriented to the unit and room; call light within reach; skin intact with no pressure ulcer noted. Will closely monitor pt. Francis Gaines Paityn Balsam RN.

## 2015-05-09 NOTE — ED Notes (Signed)
Patient transported to X-ray 

## 2015-05-09 NOTE — ED Notes (Signed)
Pt placed on 3L of o2 due to stats in lower 80's upon arrival

## 2015-05-09 NOTE — ED Notes (Signed)
Pt on bedpan; states cannot urinate; pt states she is dry.

## 2015-05-09 NOTE — ED Notes (Signed)
Pt comes from home via [facility], pt slid of toilet in bathroom, was assisted back to bed by EMS and then has syncopal episode lasting about 20 seconds. Pt has similar episode on Friday, went to hospital and was released.

## 2015-05-10 ENCOUNTER — Ambulatory Visit (HOSPITAL_COMMUNITY): Payer: Commercial Managed Care - HMO

## 2015-05-10 LAB — URINE MICROSCOPIC-ADD ON

## 2015-05-10 LAB — BASIC METABOLIC PANEL
Anion gap: 9 (ref 5–15)
BUN: 23 mg/dL — ABNORMAL HIGH (ref 6–20)
CO2: 31 mmol/L (ref 22–32)
Calcium: 8.6 mg/dL — ABNORMAL LOW (ref 8.9–10.3)
Chloride: 96 mmol/L — ABNORMAL LOW (ref 101–111)
Creatinine, Ser: 1.29 mg/dL — ABNORMAL HIGH (ref 0.44–1.00)
GFR calc non Af Amer: 36 mL/min — ABNORMAL LOW (ref >60)
GFR, EST AFRICAN AMERICAN: 42 mL/min — AB (ref >60)
Glucose, Bld: 88 mg/dL (ref 65–99)
POTASSIUM: 5.2 mmol/L — AB (ref 3.5–5.1)
SODIUM: 136 mmol/L (ref 135–145)

## 2015-05-10 LAB — URINALYSIS, ROUTINE W REFLEX MICROSCOPIC
Bilirubin Urine: NEGATIVE
Glucose, UA: NEGATIVE mg/dL
HGB URINE DIPSTICK: NEGATIVE
Ketones, ur: NEGATIVE mg/dL
Nitrite: NEGATIVE
Protein, ur: NEGATIVE mg/dL
Specific Gravity, Urine: 1.016 (ref 1.005–1.030)
UROBILINOGEN UA: 0.2 mg/dL (ref 0.0–1.0)
pH: 5 (ref 5.0–8.0)

## 2015-05-10 LAB — GLUCOSE, CAPILLARY
GLUCOSE-CAPILLARY: 92 mg/dL (ref 65–99)
GLUCOSE-CAPILLARY: 106 mg/dL — AB (ref 65–99)
Glucose-Capillary: 91 mg/dL (ref 65–99)
Glucose-Capillary: 144 mg/dL — ABNORMAL HIGH (ref 65–99)

## 2015-05-10 LAB — PROTIME-INR
INR: 2.87 — ABNORMAL HIGH (ref 0.00–1.49)
Prothrombin Time: 29.6 seconds — ABNORMAL HIGH (ref 11.6–15.2)

## 2015-05-10 MED ORDER — VITAMIN K1 10 MG/ML IJ SOLN
1.0000 mg | Freq: Once | INTRAVENOUS | Status: AC
  Administered 2015-05-10: 1 mg via INTRAVENOUS
  Filled 2015-05-10: qty 0.1

## 2015-05-10 NOTE — Progress Notes (Signed)
Pt temp 101.9, prn tylenol adm; pt given IS and educated on usage along with family in room. All voices understanding and pt able to demonstrate back with teach back method. Will closely monitor pt. Delia Heady RN

## 2015-05-10 NOTE — Progress Notes (Signed)
Pt temp down to 99.4. Pt denies any pain or distress. Will closely monitor. Francis Gaines Airam Runions RN.

## 2015-05-10 NOTE — Progress Notes (Signed)
  Echocardiogram 2D Echocardiogram has been performed.  [name] 05/10/2015, 4:36 PM

## 2015-05-10 NOTE — Progress Notes (Signed)
Subjective:  Patient denies any chest pain states breathing has improved. Patient spiked temp to 101.5 last night. Pancultures were sent and empirically started on Rocephin. Denies any cough or sore throat denies urinary complaints. Although had difficulty urination requiring straight cath yesterday  Objective:  Vital Signs in the last 24 hours: Temp:  [98.3 F (36.8 C)-101.5 F (38.6 C)] 98.3 F (36.8 C) (06/02 0402) Pulse Rate:  [67-123] 71 (06/02 0402) Resp:  [20-22] 20 (06/02 0402) BP: (138-163)/(52-99) 138/52 mmHg (06/02 0402) SpO2:  [94 %-100 %] 95 % (06/02 0402) Weight:  [98.2 kg (216 lb 7.9 oz)-99.2 kg (218 lb 11.1 oz)] 99.2 kg (218 lb 11.1 oz) (06/02 0402)  Intake/Output from previous day: 06/01 0701 - 06/02 0700 In: -  Out: 800 [Urine:800] Intake/Output from this shift:    Physical Exam: Neck: no adenopathy, no carotid bruit and supple, symmetrical, trachea midline Lungs: Decreased breath sound at bases Heart: irregularly irregular rhythm, S1, S2 normal and soft systolic murmur noted Abdomen: soft, non-tender; bowel sounds normal; no masses,  no organomegaly Extremities: extremities normal, atraumatic, no cyanosis or edema  Lab Results:  Recent Labs  05/09/15 0319 05/09/15 0930  WBC 7.0 7.0  HGB 10.3* 11.1*  PLT 172 171    Recent Labs  05/09/15 0930 05/10/15 0540  NA 139 136  K 4.9 5.2*  CL 101 96*  CO2 29 31  GLUCOSE 66 88  BUN 18 23*  CREATININE 1.40* 1.29*    Recent Labs  05/09/15 1455 05/09/15 2108  TROPONINI 0.13* 0.15*   Hepatic Function Panel  Recent Labs  05/09/15 0930  PROT 7.6  ALBUMIN 3.7  AST 25  ALT 12*  ALKPHOS 72  BILITOT 0.7   No results for input(s): CHOL in the last 72 hours. No results for input(s): PROTIME in the last 72 hours.  Imaging: Imaging results have been reviewed and Dg Chest 2 View  05/09/2015   CLINICAL DATA:  [address] off toilet, syncopal episode.  EXAM: CHEST  2 VIEW  COMPARISON:  Chest radiograph  October 31, 2013  FINDINGS: The cardiac silhouette appears moderately enlarged, tortuous calcified aorta. Increased diffuse interstitial prominence with pulmonary vascular congestion. Elevated RIGHT hemidiaphragm. No pleural effusion. No focal consolidation. No pneumothorax. Patient is osteopenic. Soft tissue planes are nonsuspicious.  IMPRESSION: Stable to worsening cardiomegaly. Pulmonary vascular congestion and interstitial prominence consistent with pulmonary edema without focal consolidation.   Electronically Signed   By: Elon Alas M.D.   On: 05/09/2015 04:05    Cardiac Studies:  Assessment/Plan:  Status post. Recurrent syncope rule out cardiac arrhythmias Chronic atrial fibrillation Mild decompensated congestive heart failure Probable very small non-Q-wave MI with atypical presentation Hypertension Diabetes mellitus Hypercholesteremia History of DVT in the past History of CA of breast Degenerative joint disease Status post left cataract surgery History of upper GI bleed in the past Possible UTI Plan As per orders Check labs in a.m. Discussed with patient and family various options of treatment i.e. medical versus invasive left cath possible PTCA stenting this risk and benefits i.e. death MI stroke need for emergency CABG local vascular complications etc. and consents for PCI. Will schedule her for Monday once INR in therapeutic range Hold Coumadin for now and start heparin once INR below 2 per pharmacy protocol. Check labs in a.m. Check 2-D echo  LOS: 1 day    [name] 05/10/2015, 10:45 AM

## 2015-05-10 NOTE — Progress Notes (Signed)
Hypoglycemic Event  CBG: 65  Treatment: 15 GM carbohydrate snack  Symptoms: None  Follow-up CBG: Time:1417 CBG Result:72  Possible Reasons for Event: Inadequate meal intake; pt new admit to unit and pt had been NPO prior to CBG check from ED.  Comments/MD notified: Dr. Terrence Dupont notified and diet order obtained.    [name]  Remember to initiate Hypoglycemia Order Set & complete

## 2015-05-10 NOTE — Progress Notes (Signed)
Pt having 2.27 sec pause with freq pause and missed beats on telemetry. Dr. Terrence Dupont notified and he ordered to hold pt Lopressor. Lopressor held; pt asymptomatic and in bed sleeping comfortably with family at bedside. Will closely monitor pt. Reported off to incoming RN. Francis Gaines Reneka Nebergall RN.

## 2015-05-10 NOTE — Progress Notes (Signed)
Dr. Terrence Dupont notified of pt brady on monitor with heart rate dropping to the 40's. MD ordered to discontinue digoxin. Digoxin discontinued. Will closely monitor. Francis Gaines Breeana Sawtelle RN.

## 2015-05-11 ENCOUNTER — Encounter (HOSPITAL_COMMUNITY)
Admission: EM | Disposition: A | Discharge status: Home Health | Payer: Commercial Managed Care - HMO | Source: Home / Self Care | Attending: Cardiology

## 2015-05-11 HISTORY — PX: CARDIAC CATHETERIZATION: SHX172

## 2015-05-11 LAB — CBC
HEMATOCRIT: 34 % — AB (ref 36.0–46.0)
Hemoglobin: 10.2 g/dL — ABNORMAL LOW (ref 12.0–15.0)
MCH: 28 pg (ref 26.0–34.0)
MCHC: 30 g/dL (ref 30.0–36.0)
MCV: 93.4 fL (ref 78.0–100.0)
Platelets: 164 10*3/uL (ref 150–400)
RBC: 3.64 MIL/uL — ABNORMAL LOW (ref 3.87–5.11)
RDW: 16.2 % — ABNORMAL HIGH (ref 11.5–15.5)
WBC: 9.1 10*3/uL (ref 4.0–10.5)

## 2015-05-11 LAB — URINE CULTURE
Colony Count: 50000
Special Requests: NORMAL

## 2015-05-11 LAB — BASIC METABOLIC PANEL
ANION GAP: 9 (ref 5–15)
BUN: 14 mg/dL (ref 6–20)
CO2: 32 mmol/L (ref 22–32)
Calcium: 8.6 mg/dL — ABNORMAL LOW (ref 8.9–10.3)
Chloride: 96 mmol/L — ABNORMAL LOW (ref 101–111)
Creatinine, Ser: 1.01 mg/dL — ABNORMAL HIGH (ref 0.44–1.00)
GFR calc Af Amer: 57 mL/min — ABNORMAL LOW (ref >60)
GFR calc non Af Amer: 49 mL/min — ABNORMAL LOW (ref >60)
Glucose, Bld: 116 mg/dL — ABNORMAL HIGH (ref 65–99)
POTASSIUM: 4.6 mmol/L (ref 3.5–5.1)
Sodium: 137 mmol/L (ref 135–145)

## 2015-05-11 LAB — GLUCOSE, CAPILLARY
GLUCOSE-CAPILLARY: 107 mg/dL — AB (ref 65–99)
GLUCOSE-CAPILLARY: 167 mg/dL — AB (ref 65–99)
GLUCOSE-CAPILLARY: 94 mg/dL (ref 65–99)
Glucose-Capillary: 120 mg/dL — ABNORMAL HIGH (ref 65–99)

## 2015-05-11 LAB — TROPONIN I: Troponin I: 0.09 ng/mL — ABNORMAL HIGH (ref <0.031)

## 2015-05-11 LAB — PROTIME-INR
INR: 1.63 — ABNORMAL HIGH (ref 0.00–1.49)
Prothrombin Time: 19.3 seconds — ABNORMAL HIGH (ref 11.6–15.2)

## 2015-05-11 SURGERY — LEFT HEART CATH AND CORONARY ANGIOGRAPHY
Anesthesia: LOCAL

## 2015-05-11 MED ORDER — ONDANSETRON HCL 4 MG/2ML IJ SOLN: 4.0000 mg | Freq: Four times a day (QID) | INTRAMUSCULAR | Status: DC | PRN

## 2015-05-11 MED ORDER — SODIUM CHLORIDE 0.9 % IV SOLN: 250.0000 mL | INTRAVENOUS | Status: DC | PRN

## 2015-05-11 MED ORDER — IOHEXOL 350 MG/ML SOLN
INTRAVENOUS | Status: DC | PRN
  Administered 2015-05-11: 60 mL via INTRA_ARTICULAR

## 2015-05-11 MED ORDER — LIDOCAINE HCL (PF) 1 % IJ SOLN
INTRAMUSCULAR | Status: DC | PRN
  Administered 2015-05-11: 20 mg

## 2015-05-11 MED ORDER — NITROGLYCERIN 1 MG/10 ML FOR IR/CATH LAB
INTRA_ARTERIAL | Status: AC
  Filled 2015-05-11: qty 10

## 2015-05-11 MED ORDER — SODIUM CHLORIDE 0.9 % IJ SOLN: 3.0000 mL | Freq: Two times a day (BID) | INTRAMUSCULAR | Status: DC

## 2015-05-11 MED ORDER — FENTANYL CITRATE (PF) 100 MCG/2ML IJ SOLN
INTRAMUSCULAR | Status: DC | PRN
  Administered 2015-05-11: 25 ug via INTRAVENOUS

## 2015-05-11 MED ORDER — ACETAMINOPHEN 325 MG PO TABS: 650.0000 mg | ORAL_TABLET | ORAL | Status: DC | PRN

## 2015-05-11 MED ORDER — SODIUM CHLORIDE 0.9 % IJ SOLN: 3.0000 mL | INTRAMUSCULAR | Status: DC | PRN

## 2015-05-11 MED ORDER — ASPIRIN 81 MG PO CHEW
81.0000 mg | CHEWABLE_TABLET | ORAL | Status: AC
  Administered 2015-05-11: 81 mg via ORAL
  Filled 2015-05-11: qty 1

## 2015-05-11 MED ORDER — SODIUM CHLORIDE 0.9 % IV SOLN
INTRAVENOUS | Status: AC
  Administered 2015-05-11: 18:00:00 via INTRAVENOUS

## 2015-05-11 MED ORDER — FENTANYL CITRATE (PF) 100 MCG/2ML IJ SOLN
INTRAMUSCULAR | Status: AC
  Filled 2015-05-11: qty 2

## 2015-05-11 MED ORDER — SODIUM CHLORIDE 0.9 % IV SOLN
INTRAVENOUS | Status: DC
  Administered 2015-05-11: 13:00:00 via INTRAVENOUS

## 2015-05-11 MED ORDER — SODIUM CHLORIDE 0.9 % IJ SOLN
3.0000 mL | Freq: Two times a day (BID) | INTRAMUSCULAR | Status: DC
  Administered 2015-05-11: 3 mL via INTRAVENOUS

## 2015-05-11 MED ORDER — HEPARIN (PORCINE) IN NACL 2-0.9 UNIT/ML-% IJ SOLN
INTRAMUSCULAR | Status: AC
  Filled 2015-05-11: qty 1000

## 2015-05-11 MED ORDER — LIDOCAINE HCL (PF) 1 % IJ SOLN
INTRAMUSCULAR | Status: AC
  Filled 2015-05-11: qty 30

## 2015-05-11 SURGICAL SUPPLY — 8 items
CATH INFINITI 5FR MULTPACK ANG (CATHETERS) ×2 IMPLANT
HOVERMATT SINGLE USE (MISCELLANEOUS) ×2 IMPLANT
KIT HEART LEFT (KITS) ×2 IMPLANT
PACK CARDIAC CATHETERIZATION (CUSTOM PROCEDURE TRAY) ×2 IMPLANT
SHEATH PINNACLE 5F 10CM (SHEATH) ×2 IMPLANT
SYR MEDRAD MARK V 150ML (SYRINGE) ×2 IMPLANT
TRANSDUCER W/STOPCOCK (MISCELLANEOUS) ×2 IMPLANT
WIRE EMERALD 3MM-J .035X150CM (WIRE) ×2 IMPLANT

## 2015-05-11 NOTE — H&P (View-Only) (Signed)
Subjective:  Patient denies any chest pain states breathing is improved. Denies any dizziness lightheadedness. Had frequent pauses up to 2.27 second. Digoxin and Lopressor has been held. Cardiac enzymes trending down.  Objective:  Vital Signs in the last 24 hours: Temp:  [97.9 F (36.6 C)-101.9 F (38.8 C)] 98.4 F (36.9 C) (06/03 9323) Pulse Rate:  [57-75] 75 (06/03 0611) Resp:  [16-18] 18 (06/03 0611) BP: (122-151)/(58-75) 147/58 mmHg (06/03 0611) SpO2:  [95 %-97 %] 95 % (06/03 0611) Weight:  [98 kg (216 lb 0.8 oz)] 98 kg (216 lb 0.8 oz) (06/03 0346)  Intake/Output from previous day: 06/02 0701 - 06/03 0700 In: 387 [P.O.:237; IV Piggyback:150] Out: 450 [Urine:450] Intake/Output from this shift: Total I/O In: -  Out: 400 [Urine:400]  Physical Exam: Neck: no adenopathy, no carotid bruit, no JVD and supple, symmetrical, trachea midline Lungs: Decreased breath sound at bases Heart: irregularly irregular rhythm, S1, S2 normal and Soft systolic murmur noted Abdomen: soft, non-tender; bowel sounds normal; no masses,  no organomegaly Extremities: extremities normal, atraumatic, no cyanosis or edema  Lab Results:  Recent Labs  05/09/15 0930 05/11/15 0915  WBC 7.0 9.1  HGB 11.1* 10.2*  PLT 171 164    Recent Labs  05/10/15 0540 05/11/15 0915  NA 136 137  K 5.2* 4.6  CL 96* 96*  CO2 31 32  GLUCOSE 88 116*  BUN 23* 14  CREATININE 1.29* 1.01*    Recent Labs  05/09/15 2108 05/11/15 0915  TROPONINI 0.15* 0.09*   Hepatic Function Panel  Recent Labs  05/09/15 0930  PROT 7.6  ALBUMIN 3.7  AST 25  ALT 12*  ALKPHOS 72  BILITOT 0.7   No results for input(s): CHOL in the last 72 hours. No results for input(s): PROTIME in the last 72 hours.  Imaging: Imaging results have been reviewed and No results found.  Cardiac Studies:  Assessment/Plan:  Status post. Recurrent syncope rule out cardiac arrhythmias Chronic atrial fibrillation Mild decompensated  congestive heart failure secondary to preserved LV systolic function Probable very small non-Q-wave MI with atypical presentation Hypertension Diabetes mellitus Hypercholesteremia History of DVT in the past History of CA of breast Degenerative joint disease Status post left cataract surgery History of upper GI bleed in the past Possible UTI Plan Discussed with patient and family regarding left eye possible PTCA stenting its risk and benefits i.e. death MI stroke need for emergency CABG local vascular competitions etc. and consents for PCI Dr. Doylene Canard on call for weekend  LOS: 2 days    [name] 05/11/2015, 11:56 AM

## 2015-05-11 NOTE — Care Management Note (Addendum)
Case Management Note  Patient Details  Name: [name] MRN: [id number] Date of Birth: 1926-07-26  Subjective/Objective:                    Action/Plan:   Expected Discharge Date:                  Expected Discharge Plan:  [place]  In-House Referral:     Discharge planning Services  CM Consult  Post Acute Care Choice:    Choice offered to:  Adult Children  DME Arranged:    DME Agency:     HH Arranged:  RN, PT [place] Agency:  [facility]  Status of Service:     Medicare Important Message Given:  Yes Date Medicare IM Given:  05/11/15 Medicare IM give by:  Jonnie Finner RN CCM  Date Additional Medicare IM Given:    Additional Medicare Important Message give by:     If discussed at [place] of Stay Meetings, dates discussed:    Additional Comments: NCM spokle to pt and gave permission to speak to dtr, [facility] [phone], dtr's address [address], [place] [address]. States will go to her home at dc. Provided [facility] list and offered choice for HH. Dtr requested [facility] for. Waiting final recommendations for home. Dtr interested in [facility] aide that will be covered under pt's Medicaid. Explained the [place] and explained pt's PCP will need to complete forms. Will provide dtr with forms to be completed by PCP's office.  Erenest Rasher, RN 05/11/2015, 2:53 PM

## 2015-05-11 NOTE — Interval H&P Note (Signed)
Cath Lab Visit (complete for each Cath Lab visit)  Clinical Evaluation Leading to the Procedure:   ACS: Yes.    Non-ACS:    Anginal Classification: CCS III  Anti-ischemic medical therapy: Maximal Therapy (2 or more classes of medications)  Non-Invasive Test Results: No non-invasive testing performed  Prior CABG: No previous CABG      History and Physical Interval Note:  05/11/2015 4:25 PM  [name]  has presented today for surgery, with the diagnosis of cp  The various methods of treatment have been discussed with the patient and family. After consideration of risks, benefits and other options for treatment, the patient has consented to  Procedure(s): Left Heart Cath and Coronary Angiography (N/A) as a surgical intervention .  The patient's history has been reviewed, patient examined, no change in status, stable for surgery.  I have reviewed the patient's chart and labs.  Questions were answered to the patient's satisfaction.     [name]

## 2015-05-11 NOTE — Progress Notes (Signed)
Site area: right groin a 5 french arterial sheath was removed  Site Prior to Removal:  Level 0  Pressure Applied For 20 MINUTES    Minutes Beginning at 1745  Manual:   Yes.    Patient Status During Pull:  stable  Post Pull Groin Site:  Level 0  Post Pull Instructions Given:  Yes.    Post Pull Pulses Present:  Yes.    Dressing Applied:  Yes.    Comments:  VS remain stable during sheath pull.  Pt denies any discomfort at this time.

## 2015-05-11 NOTE — Progress Notes (Signed)
Subjective:  Patient denies any chest pain states breathing is improved. Denies any dizziness lightheadedness. Had frequent pauses up to 2.27 second. Digoxin and Lopressor has been held. Cardiac enzymes trending down.  Objective:  Vital Signs in the last 24 hours: Temp:  [97.9 F (36.6 C)-101.9 F (38.8 C)] 98.4 F (36.9 C) (06/03 4496) Pulse Rate:  [57-75] 75 (06/03 0611) Resp:  [16-18] 18 (06/03 0611) BP: (122-151)/(58-75) 147/58 mmHg (06/03 0611) SpO2:  [95 %-97 %] 95 % (06/03 0611) Weight:  [98 kg (216 lb 0.8 oz)] 98 kg (216 lb 0.8 oz) (06/03 0346)  Intake/Output from previous day: 06/02 0701 - 06/03 0700 In: 387 [P.O.:237; IV Piggyback:150] Out: 450 [Urine:450] Intake/Output from this shift: Total I/O In: -  Out: 400 [Urine:400]  Physical Exam: Neck: no adenopathy, no carotid bruit, no JVD and supple, symmetrical, trachea midline Lungs: Decreased breath sound at bases Heart: irregularly irregular rhythm, S1, S2 normal and Soft systolic murmur noted Abdomen: soft, non-tender; bowel sounds normal; no masses,  no organomegaly Extremities: extremities normal, atraumatic, no cyanosis or edema  Lab Results:  Recent Labs  05/09/15 0930 05/11/15 0915  WBC 7.0 9.1  HGB 11.1* 10.2*  PLT 171 164    Recent Labs  05/10/15 0540 05/11/15 0915  NA 136 137  K 5.2* 4.6  CL 96* 96*  CO2 31 32  GLUCOSE 88 116*  BUN 23* 14  CREATININE 1.29* 1.01*    Recent Labs  05/09/15 2108 05/11/15 0915  TROPONINI 0.15* 0.09*   Hepatic Function Panel  Recent Labs  05/09/15 0930  PROT 7.6  ALBUMIN 3.7  AST 25  ALT 12*  ALKPHOS 72  BILITOT 0.7   No results for input(s): CHOL in the last 72 hours. No results for input(s): PROTIME in the last 72 hours.  Imaging: Imaging results have been reviewed and No results found.  Cardiac Studies:  Assessment/Plan:  Status post. Recurrent syncope rule out cardiac arrhythmias Chronic atrial fibrillation Mild decompensated  congestive heart failure secondary to preserved LV systolic function Probable very small non-Q-wave MI with atypical presentation Hypertension Diabetes mellitus Hypercholesteremia History of DVT in the past History of CA of breast Degenerative joint disease Status post left cataract surgery History of upper GI bleed in the past Possible UTI Plan Discussed with patient and family regarding left eye possible PTCA stenting its risk and benefits i.e. death MI stroke need for emergency CABG local vascular competitions etc. and consents for PCI Dr. Doylene Canard on call for weekend  LOS: 2 days    [name] 05/11/2015, 11:56 AM

## 2015-05-12 LAB — GLUCOSE, CAPILLARY
Glucose-Capillary: 113 mg/dL — ABNORMAL HIGH (ref 65–99)
Glucose-Capillary: 113 mg/dL — ABNORMAL HIGH (ref 65–99)
Glucose-Capillary: 124 mg/dL — ABNORMAL HIGH (ref 65–99)
Glucose-Capillary: 120 mg/dL — ABNORMAL HIGH (ref 65–99)

## 2015-05-12 LAB — FERRITIN: FERRITIN: 70 ng/mL (ref 11–307)

## 2015-05-12 LAB — BASIC METABOLIC PANEL
Anion gap: 5 (ref 5–15)
BUN: 12 mg/dL (ref 6–20)
CHLORIDE: 97 mmol/L — AB (ref 101–111)
CO2: 35 mmol/L — ABNORMAL HIGH (ref 22–32)
Calcium: 8.4 mg/dL — ABNORMAL LOW (ref 8.9–10.3)
Creatinine, Ser: 0.92 mg/dL (ref 0.44–1.00)
GFR calc non Af Amer: 55 mL/min — ABNORMAL LOW (ref >60)
Glucose, Bld: 115 mg/dL — ABNORMAL HIGH (ref 65–99)
Potassium: 4.9 mmol/L (ref 3.5–5.1)
Sodium: 137 mmol/L (ref 135–145)

## 2015-05-12 LAB — IRON AND TIBC
Iron: 74 ug/dL (ref 28–170)
SATURATION RATIOS: 21 % (ref 10.4–31.8)
TIBC: 346 ug/dL (ref 250–450)
UIBC: 272 ug/dL

## 2015-05-12 NOTE — Progress Notes (Signed)
Ref: Maximino Greenland, MD   Subjective:  Feeling better. Lives alone. Claims to live with daughter post discharge.  Objective:  Vital Signs in the last 24 hours: Temp:  [97.8 F (36.6 C)-98.7 F (37.1 C)] 97.8 F (36.6 C) (06/04 0257) Pulse Rate:  [0-83] 72 (06/04 0257) Cardiac Rhythm:  [-] Atrial fibrillation (06/04 0814) Resp:  [12-27] 20 (06/04 0257) BP: (128-158)/(28-77) 128/48 mmHg (06/04 0257) SpO2:  [95 %-100 %] 95 % (06/04 0257) Weight:  [99 kg (218 lb 4.1 oz)] 99 kg (218 lb 4.1 oz) (06/04 0257)  Physical Exam: BP Readings from Last 1 Encounters:  05/12/15 128/48    Wt Readings from Last 1 Encounters:  05/12/15 99 kg (218 lb 4.1 oz)    Weight change: 1 kg (2 lb 3.3 oz)  HEENT: /AT, Eyes-Brown, PERL, EOMI, Conjunctiva-Pink, Sclera-Non-icteric Neck: No JVD, No bruit, Trachea midline. Lungs:  Clear, Bilateral. Cardiac:  Irregular rhythm, normal S1 and S2, no S3. II/VI systolic murmur. Abdomen:  Soft, non-tender. Extremities:  No edema present. No cyanosis. No clubbing. CNS: AxOx3, Cranial nerves grossly intact, moves all 4 extremities. Right handed. Skin: Warm and dry.   Intake/Output from previous day: 06/03 0701 - 06/04 0700 In: -  Out: 400 [Urine:400]    Lab Results: BMET    Component Value Date/Time   NA 137 05/12/2015 0244   NA 137 05/11/2015 0915   NA 136 05/10/2015 0540   NA 137 05/03/2015 1422   NA 139 04/20/2014 1240   NA 139 04/22/2013 1125   K 4.9 05/12/2015 0244   K 4.6 05/11/2015 0915   K 5.2* 05/10/2015 0540   K 4.5 05/03/2015 1422   K 5.2* 04/20/2014 1240   K 4.3 04/22/2013 1125   CL 97* 05/12/2015 0244   CL 96* 05/11/2015 0915   CL 96* 05/10/2015 0540   CL 104 04/22/2013 1125   CL 106 08/24/2012 0859   CO2 35* 05/12/2015 0244   CO2 32 05/11/2015 0915   CO2 31 05/10/2015 0540   CO2 26 05/03/2015 1422   CO2 26 04/20/2014 1240   CO2 25 04/22/2013 1125   GLUCOSE 115* 05/12/2015 0244   GLUCOSE 116* 05/11/2015 0915   GLUCOSE 88  05/10/2015 0540   GLUCOSE 104 05/03/2015 1422   GLUCOSE 73 04/20/2014 1240   GLUCOSE 102* 04/22/2013 1125   GLUCOSE 103* 08/24/2012 0859   BUN 12 05/12/2015 0244   BUN 14 05/11/2015 0915   BUN 23* 05/10/2015 0540   BUN 18.8 05/03/2015 1422   BUN 27.4* 04/20/2014 1240   BUN 18.0 04/22/2013 1125   CREATININE 0.92 05/12/2015 0244   CREATININE 1.01* 05/11/2015 0915   CREATININE 1.29* 05/10/2015 0540   CREATININE 1.4* 05/03/2015 1422   CREATININE 1.1 04/20/2014 1240   CREATININE 1.1 04/22/2013 1125   CALCIUM 8.4* 05/12/2015 0244   CALCIUM 8.6* 05/11/2015 0915   CALCIUM 8.6* 05/10/2015 0540   CALCIUM 8.5 05/03/2015 1422   CALCIUM 9.3 04/20/2014 1240   CALCIUM 8.6 04/22/2013 1125   GFRNONAA 55* 05/12/2015 0244   GFRNONAA 49* 05/11/2015 0915   GFRNONAA 36* 05/10/2015 0540   GFRAA >60 05/12/2015 0244   GFRAA 57* 05/11/2015 0915   GFRAA 42* 05/10/2015 0540   CBC    Component Value Date/Time   WBC 9.1 05/11/2015 0915   WBC 6.8 05/03/2015 1422   RBC 3.64* 05/11/2015 0915   RBC 3.75 05/03/2015 1422   RBC 2.29* 09/03/2012 1334   HGB 10.2* 05/11/2015 0915   HGB 10.9*  05/03/2015 1422   HCT 34.0* 05/11/2015 0915   HCT 34.9 05/03/2015 1422   PLT 164 05/11/2015 0915   PLT 169 05/03/2015 1422   MCV 93.4 05/11/2015 0915   MCV 93.1 05/03/2015 1422   MCH 28.0 05/11/2015 0915   MCH 29.1 05/03/2015 1422   MCHC 30.0 05/11/2015 0915   MCHC 31.2* 05/03/2015 1422   RDW 16.2* 05/11/2015 0915   RDW 16.5* 05/03/2015 1422   LYMPHSABS 2.1 05/09/2015 0930   LYMPHSABS 2.3 05/03/2015 1422   MONOABS 0.8 05/09/2015 0930   MONOABS 0.6 05/03/2015 1422   EOSABS 0.1 05/09/2015 0930   EOSABS 0.2 05/03/2015 1422   BASOSABS 0.0 05/09/2015 0930   BASOSABS 0.0 05/03/2015 1422   HEPATIC Function Panel  Recent Labs  05/03/15 1422 05/09/15 0319 05/09/15 0930  PROT 7.4 7.7 7.6   HEMOGLOBIN A1C No components found for: HGA1C,  MPG CARDIAC ENZYMES Lab Results  Component Value Date   TROPONINI  0.09* 05/11/2015   TROPONINI 0.15* 05/09/2015   TROPONINI 0.13* 05/09/2015   BNP No results for input(s): PROBNP in the last 8760 hours. TSH  Recent Labs  05/09/15 0930  TSH 0.822   CHOLESTEROL No results for input(s): CHOL in the last 8760 hours.  Scheduled Meds: . aspirin EC  81 mg Oral Daily  . atorvastatin  20 mg Oral Daily  . cefTRIAXone (ROCEPHIN)  IV  1 g Intravenous Q24H  . furosemide  40 mg Intravenous Daily  . insulin aspart  0-9 Units Subcutaneous TID WC  . ketorolac  1 drop Left Eye Q2H while awake  . losartan  50 mg Oral Daily  . metoprolol tartrate  12.5 mg Oral Q12H  . ofloxacin  1 drop Left Eye Q2H while awake  . pantoprazole  40 mg Oral BID AC  . prednisoLONE acetate  1 drop Left Eye Q2H while awake  . sodium chloride  3 mL Intravenous Q12H  . sodium chloride  3 mL Intravenous Q12H  . sodium chloride  3 mL Intravenous Q12H   Continuous Infusions: . sodium chloride 100 mL/hr at 05/11/15 1314   PRN Meds:.sodium chloride, sodium chloride, sodium chloride, acetaminophen, ondansetron (ZOFRAN) IV, sodium chloride, sodium chloride, sodium chloride  Assessment/Plan: Status post. Recurrent syncope rule out cardiac arrhythmias Chronic atrial fibrillation Mild decompensated congestive heart failure secondary to preserved LV systolic function Probable very small non-Q-wave MI with atypical presentation Hypertension Diabetes mellitus Hypercholesteremia History of DVT in the past History of CA of breast Degenerative joint disease Status post left cataract surgery History of upper GI bleed in the past Possible UTI  Will discuss with daughter about her care prior to discharge     LOS: 3 days    Dixie Dials  MD  05/12/2015, 10:12 AM

## 2015-05-12 NOTE — Progress Notes (Signed)
Night nurse reported that pt was confused last hs, 05/11/15 and possible d/c today. Concerned because pt lives alone. Cato Mulligan RN

## 2015-05-13 LAB — GLUCOSE, CAPILLARY
GLUCOSE-CAPILLARY: 101 mg/dL — AB (ref 65–99)
Glucose-Capillary: 111 mg/dL — ABNORMAL HIGH (ref 65–99)

## 2015-05-13 MED ORDER — CARVEDILOL 3.125 MG PO TABS: 3.1250 mg | ORAL_TABLET | Freq: Two times a day (BID) | ORAL | Status: DC

## 2015-05-13 NOTE — Progress Notes (Signed)
SATURATION QUALIFICATIONS: (This note is used to comply with regulatory documentation for home oxygen)  Patient Saturations on Room Air at Rest = 99%  Patient Saturations on Room Air while Ambulating = 81%  Patient Saturations on 3 Liters of oxygen while Ambulating = 83%  Please briefly explain why patient needs home oxygen:

## 2015-05-13 NOTE — Evaluation (Signed)
Physical Therapy Evaluation Patient Details Name: [name] MRN: [id number] DOB: 17-Nov-1926 Today's Date: 05/13/2015   History of Present Illness   81 yo female admitted with chest pain, underwent left heart cath 6/3.  Clinical Impression  Pt presents with moderate limitations to functional mobility complicated by cognitive changes (orientation, judgment, awareness, memory) and observed oxygen desaturation on RA during activity and at rest.  Pt found to be at 81-83% O2 after 50 foot walk, unable to recover with rest and reapplication of 2L O2; titrated to 3L with recovery back to 100% following <60 rest.  RN verbally updated.  Suspect pt may need O2 at d/c if this persists.  Pt unable to correctly identfy date (June '66), place ([facility], its a restaurant, you come here to eat and sleep), or situation (did not recall heart procedure).  Inconsistently reports prior functional level (lives alone, lives with daughter) but tells this PT she will do whatever her children tell her to do at d/c.  Physically requires at least contact supervision for safety and will benefit from ongoing PT to address strength and gait deficits.    Given whole picture, suspect SNF is best option if family is not able to provide 24/7 care at home.  Please ask CSW to assist with placement if family agrees.  Recommend up in room with nursing, use RW in room for safety, assess SaO2.  Thank you.    Follow Up Recommendations SNF (if guaranteed 24/7 supervision, may be able to d/c home)    Equipment Recommendations  None recommended by PT (has RW per report)    Recommendations for Other Services       Precautions / Restrictions Precautions Precautions: Fall Precaution Comments: use RW, needs O2, up with assist only Restrictions Weight Bearing Restrictions: No      Mobility  Bed Mobility Overal bed mobility: Needs Assistance Bed Mobility: Supine to Sit     Supine to sit: Supervision;HOB elevated     General  bed mobility comments: needs verbal cues to initiate, very slow, reminders to complete task, otherwise accomplishes without physical assist  Transfers Overall transfer level: Needs assistance Equipment used: Rolling walker (2 wheeled) Transfers: Sit to/from Stand Sit to Stand: Min guard         General transfer comment: cues for safest hand placement to/from sitting using RW; inconsistently scoots out, uses momentum/rocking, reminders to sit slowly (don't plop), constant cues to align with surface before sitting  Ambulation/Gait Ambulation/Gait assistance: Min assist (pt=95%) Ambulation Distance (Feet): 50 Feet Assistive device: Rolling walker (2 wheeled) Gait Pattern/deviations: Step-through pattern;Decreased stride length;Shuffle;Trunk flexed;Narrow base of support Gait velocity: unmeasures but significantly decreased Gait velocity interpretation: Below normal speed for age/gender General Gait Details: initially chose to walk without RW (10 feet), gait is slow, short steps, flexed posture; transitioned to RW, would not (even with cues) maintain safe proximity to device or tall posture or looking forward; admitted fatigue but would not offer unless asked.  SaO2 to 81% on RA, would not recover with rest, reapplied 2L Ox, instructed in pursed lip, pt with minimal attention to instruction; increased to 3L Ox, pt back to 100% in <60 seconds; retitrated to 2.5L and informed RN.  Stairs            Wheelchair Mobility    Modified Rankin (Stroke Patients Only)       Balance Overall balance assessment: No apparent balance deficits (not formally assessed)  Standardized Balance Assessment Standardized Balance Assessment :  (not performed this session/suspect sig deficit )           Pertinent Vitals/Pain Pain Assessment: No/denies pain    Home Living Family/patient expects to be discharged to:: Private residence Living Arrangements:  Alone Available Help at Discharge: Family;Available PRN/intermittently Type of Home: Apartment Home Access: Elevator     Home Layout: One level Home Equipment: Gilford Rile - 2 wheels Additional Comments: pt 'does for myself' and reluctant to have people in the home, but willing to do whatever her children tell her.    Prior Function Level of Independence: Independent with assistive device(s)               Hand Dominance        Extremity/Trunk Assessment   Upper Extremity Assessment: Generalized weakness           Lower Extremity Assessment: Generalized weakness      Cervical / Trunk Assessment: Other exceptions (obesity)  Communication   Communication: No difficulties  Cognition Arousal/Alertness: Awake/alert Behavior During Therapy: WFL for tasks assessed/performed;Flat affect Overall Cognitive Status: Impaired/Different from baseline Area of Impairment: Orientation;Awareness;Memory Orientation Level: Disoriented to;Place;Time;Situation   Memory: Decreased short-term memory     Awareness: Intellectual (inconsistent)   General Comments: Pt states it's June '66, [facility] is a restaurant/cafe, where you come to eat and sleep.  Does not remember PT after 5 minutes out of room; inconsistently aware of medical status, does not recall reason for admission (I feel twice at home)    General Comments General comments (skin integrity, edema, etc.): see Gait Details -- desaturates on RA    Exercises        Assessment/Plan    PT Assessment All further PT needs can be met in the next venue of care;Patient needs continued PT services  PT Diagnosis Difficulty walking;Generalized weakness   PT Problem List Obesity;Cardiopulmonary status limiting activity;Decreased knowledge of precautions;Decreased safety awareness;Decreased knowledge of use of DME;Decreased cognition;Decreased mobility;Decreased balance;Decreased activity tolerance;Decreased strength  PT Treatment  Interventions     PT Goals (Current goals can be found in the Care Plan section) Acute Rehab PT Goals Patient Stated Goal: live alone PT Goal Formulation: All assessment and education complete, DC therapy    Frequency     Barriers to discharge Decreased caregiver support lives alone, may or may not have family to stay; requires 24/7 supervision until cognition clears and physical performance improves    Co-evaluation               End of Session Equipment Utilized During Treatment: Gait belt;Oxygen Activity Tolerance: Patient limited by fatigue Patient left: in chair;with call bell/phone within reach Nurse Communication: Mobility status;Precautions         Time: 5038-8828 PT Time Calculation (min) (ACUTE ONLY): 42 min   Charges:   PT Evaluation $Initial PT Evaluation Tier I: 1 Procedure PT Treatments $Therapeutic Activity: 8-22 mins   PT G Codes:        Maxine, Fredman 05/13/2015, 10:09 AM

## 2015-05-13 NOTE — Progress Notes (Signed)
CSW notified by [facility] that patient requires EMS transport home.  CSW completed EMS paperwork and contacted [facility] for transport.  CSW discussed with patient's nurse who stated that family was in the room and ready for patient to leave.  CSW signing off.  Lorie Phenix. Pauline Good, [place]

## 2015-05-13 NOTE — Progress Notes (Addendum)
Cm called [facility] DME rep Jeneen Rinks to please deliver oxygen to room for discharge. CM also called [facility] Rep Tiffany to notify of discharge. No other CM needs communicated.

## 2015-05-13 NOTE — Discharge Summary (Signed)
Physician Discharge Summary  Patient ID: RILIE GLANZ MRN: [id number] DOB/AGE: [birth year] 81 y.o.  Admit date: 05/09/2015 Discharge date: 05/13/2015  Admission Diagnoses: Status post. Recurrent syncope rule out cardiac arrhythmias Chronic atrial fibrillation Mild decompensated congestive heart failure secondary to preserved LV systolic function Probable very small non-Q-wave MI with atypical presentation Hypertension Diabetes mellitus Hypercholesteremia History of DVT in the past History of CA of breast Degenerative joint disease Status post left cataract surgery History of upper GI bleed in the past Possible UTI  Discharge Diagnoses:  Principle Problem: *Small non-Q-wave MI*  Syncope  Chronic atrial fibrillation Mild decompensated congestive heart failure secondary to systolic and diastolic dysunction Hypertension Diabetes mellitus, II Hypercholesteremia History of DVT in the past History of CA of breast Degenerative joint disease Status post left cataract surgery History of upper GI bleed in the past UTI  Discharged Condition: fair  Hospital Course: Patient is 81 year old female with past medical history significant for hypertension, diabetes mellitus, chronic atrial fibrillation, history of congestive heart failure secondary to preserved LV systolic function, history of DVT, history of CVA of breast, history of DVT in the past, and degenerative joint disease, hypercholesteremia, came to the ER by EMS following syncopal episode while on toilet seat. She had minimal elevation of troponin-I. She underwent cardiac cath showing mild CAD. She required PT consult and was found to be hypoxic with activity. Her coumadin was resumed. Oxygen was prescribed and she was discharged home with follow up by primary care in 1 week. She is moving in with her daughter.  Consults: cardiology  Significant Diagnostic Studies: labs: Near normal CBC with low hgb of 10.3. Iron level was normal.  Electrolytes were normal with elevated BUN/Cr of 19/1.53  Treatments: antibiotics: ceftriaxone. Cardiac medications: Atorvastatin, digoxin, lasix, potassium and warfarin.   Discharge Exam: Blood pressure 126/71, pulse 68, temperature 97.8 F (36.6 C), temperature source Oral, resp. rate 18, height 5\' 4"  (1.626 m), weight 99.3 kg (218 lb 14.7 oz), SpO2 99 %. HEENT: [place]/AT, Eyes-Brown, PERL, EOMI, Conjunctiva-Pale pink, Sclera-Non-icteric Neck: No JVD, No bruit, Trachea midline. Lungs: Clear, Bilateral. Cardiac: Irregular rhythm, normal S1 and S2, no S3. II/VI systolic murmur. Abdomen: Soft, non-tender. Extremities: No edema present. No cyanosis. No clubbing. CNS: AxOx3, Cranial nerves grossly intact, moves all 4 extremities. Right handed. Skin: Warm and dry.  Disposition: 01-Home or Self Care     Medication List    STOP taking these medications        amLODipine-valsartan 5-320 MG per tablet  Commonly known as:  EXFORGE     atenolol 50 MG tablet  Commonly known as:  TENORMIN      TAKE these medications        atorvastatin 20 MG tablet  Commonly known as:  LIPITOR  Take 20 mg by mouth daily.     carvedilol 3.125 MG tablet  Commonly known as:  COREG  Take 1 tablet (3.125 mg total) by mouth 2 (two) times daily with a meal.     digoxin 0.125 MG tablet  Commonly known as:  LANOXIN  Take 125 mcg by mouth daily.     furosemide 20 MG tablet  Commonly known as:  LASIX  Take 20 mg by mouth as directed. Takes 40 mg in the AM,  20 mg in the PM.     gabapentin 300 MG capsule  Commonly known as:  NEURONTIN  Take 300 mg by mouth every morning.     glimepiride 2 MG tablet  Commonly known  as:  AMARYL  Take 2 mg by mouth daily before breakfast.     ketorolac 0.5 % ophthalmic solution  Commonly known as:  ACULAR  Place 1 drop into the left eye every 2 (two) hours while awake.     ofloxacin 0.3 % ophthalmic solution  Commonly known as:  OCUFLOX  Place 1 drop into the left  eye every 2 (two) hours while awake.     pantoprazole 40 MG tablet  Commonly known as:  PROTONIX  Take 1 tablet (40 mg total) by mouth 2 (two) times daily before a meal.     potassium chloride SA 20 MEQ tablet  Commonly known as:  K-DUR,KLOR-CON  Take 20 mEq by mouth 2 (two) times daily.     prednisoLONE acetate 1 % ophthalmic suspension  Commonly known as:  PRED FORTE  Place 1 drop into the left eye every 2 (two) hours while awake.     warfarin 2 MG tablet  Commonly known as:  COUMADIN  Take 2-4 mg by mouth daily. Takes 1 tablet daily, except takes 2 tablets on Thursdays and and Saturdays.           Follow-up Information    Follow up with Maximino Greenland, MD. Schedule an appointment as soon as possible for a visit in 1 week.   Specialty:  Internal Medicine   Contact information:   [address] [place] [address] [phone]       Signed: Birdie Riddle 05/13/2015, 9:59 AM

## 2015-05-13 NOTE — Care Management Note (Signed)
Case Management Note  Patient Details  Name: [name] MRN: [id number] Date of Birth: 16-Jan-1926  Subjective/Objective:                   Chief Complaint: Recurrent syncope Action/Plan:  Discharge planning Expected Discharge Date:  6/516               Expected Discharge Plan:  [place]  In-House Referral:  Clinical Social Work  Discharge planning Services  CM Consult  Post Acute Care Choice:    Choice offered to:  Adult Children  DME Arranged:    DME Agency:     HH Arranged:  RN, PT, Nurse's Aide [place] Agency:  [facility]  Status of Service:  Completed, signed off  Medicare Important Message Given:  Yes Date Medicare IM Given:  05/11/15 Medicare IM give by:  Jonnie Finner RN CCM  Date Additional Medicare IM Given:    Additional Medicare Important Message give by:     If discussed at [place] of Stay Meetings, dates discussed:    Additional Comments: CM called [facility] DME rep, Jeneen Rinks to please deliver home O2 tank to room so daughter of pt, [name] [phone] can transport home.  CM called CSW, Butch Penny to please arrange transportation to [facility]' home: [place], [place] [address] once cletus has left with the O2 tank (to meet the ambulance transport home).  CM called [facility] rep, tiffany to update on home address and to arrange for [facility].  No other CM needs were communicated.   [name], RN 05/13/2015, 12:55 PM

## 2015-05-14 ENCOUNTER — Encounter (HOSPITAL_COMMUNITY): Payer: Self-pay | Admitting: Cardiology

## 2015-05-14 LAB — HEMOGLOBIN A1C
Hgb A1c MFr Bld: 5.9 % — ABNORMAL HIGH (ref 4.8–5.6)
Mean Plasma Glucose: 123 mg/dL

## 2015-05-14 LAB — GLUCOSE, CAPILLARY: GLUCOSE-CAPILLARY: 89 mg/dL (ref 65–99)

## 2015-05-15 DIAGNOSIS — E785 Hyperlipidemia, unspecified: Secondary | ICD-10-CM | POA: Diagnosis not present

## 2015-05-15 DIAGNOSIS — E119 Type 2 diabetes mellitus without complications: Secondary | ICD-10-CM | POA: Diagnosis not present

## 2015-05-15 DIAGNOSIS — I5030 Unspecified diastolic (congestive) heart failure: Secondary | ICD-10-CM | POA: Diagnosis not present

## 2015-05-15 DIAGNOSIS — I214 Non-ST elevation (NSTEMI) myocardial infarction: Secondary | ICD-10-CM | POA: Diagnosis not present

## 2015-05-15 DIAGNOSIS — I10 Essential (primary) hypertension: Secondary | ICD-10-CM | POA: Diagnosis not present

## 2015-05-15 DIAGNOSIS — I2510 Atherosclerotic heart disease of native coronary artery without angina pectoris: Secondary | ICD-10-CM | POA: Diagnosis not present

## 2015-05-15 DIAGNOSIS — E669 Obesity, unspecified: Secondary | ICD-10-CM | POA: Diagnosis not present

## 2015-05-15 DIAGNOSIS — D649 Anemia, unspecified: Secondary | ICD-10-CM | POA: Diagnosis not present

## 2015-05-15 MED FILL — Heparin Sodium (Porcine) 2 Unit/ML in Sodium Chloride 0.9%: INTRAMUSCULAR | Qty: 1000 | Status: AC

## 2015-05-16 DIAGNOSIS — I482 Chronic atrial fibrillation: Secondary | ICD-10-CM | POA: Diagnosis not present

## 2015-05-16 DIAGNOSIS — I2510 Atherosclerotic heart disease of native coronary artery without angina pectoris: Secondary | ICD-10-CM | POA: Diagnosis not present

## 2015-05-16 DIAGNOSIS — Z9981 Dependence on supplemental oxygen: Secondary | ICD-10-CM | POA: Diagnosis not present

## 2015-05-16 DIAGNOSIS — I214 Non-ST elevation (NSTEMI) myocardial infarction: Secondary | ICD-10-CM | POA: Diagnosis not present

## 2015-05-16 DIAGNOSIS — I10 Essential (primary) hypertension: Secondary | ICD-10-CM | POA: Diagnosis not present

## 2015-05-16 DIAGNOSIS — E785 Hyperlipidemia, unspecified: Secondary | ICD-10-CM | POA: Diagnosis not present

## 2015-05-16 DIAGNOSIS — M1990 Unspecified osteoarthritis, unspecified site: Secondary | ICD-10-CM | POA: Diagnosis not present

## 2015-05-16 DIAGNOSIS — E119 Type 2 diabetes mellitus without complications: Secondary | ICD-10-CM | POA: Diagnosis not present

## 2015-05-16 DIAGNOSIS — I5040 Unspecified combined systolic (congestive) and diastolic (congestive) heart failure: Secondary | ICD-10-CM | POA: Diagnosis not present

## 2015-05-16 LAB — CULTURE, BLOOD (ROUTINE X 2)
Culture: NO GROWTH
Culture: NO GROWTH

## 2015-05-17 DIAGNOSIS — E119 Type 2 diabetes mellitus without complications: Secondary | ICD-10-CM | POA: Diagnosis not present

## 2015-05-17 DIAGNOSIS — M1990 Unspecified osteoarthritis, unspecified site: Secondary | ICD-10-CM | POA: Diagnosis not present

## 2015-05-17 DIAGNOSIS — Z9981 Dependence on supplemental oxygen: Secondary | ICD-10-CM | POA: Diagnosis not present

## 2015-05-17 DIAGNOSIS — I10 Essential (primary) hypertension: Secondary | ICD-10-CM | POA: Diagnosis not present

## 2015-05-17 DIAGNOSIS — E785 Hyperlipidemia, unspecified: Secondary | ICD-10-CM | POA: Diagnosis not present

## 2015-05-17 DIAGNOSIS — I214 Non-ST elevation (NSTEMI) myocardial infarction: Secondary | ICD-10-CM | POA: Diagnosis not present

## 2015-05-17 DIAGNOSIS — I482 Chronic atrial fibrillation: Secondary | ICD-10-CM | POA: Diagnosis not present

## 2015-05-17 DIAGNOSIS — I5040 Unspecified combined systolic (congestive) and diastolic (congestive) heart failure: Secondary | ICD-10-CM | POA: Diagnosis not present

## 2015-05-17 DIAGNOSIS — I2510 Atherosclerotic heart disease of native coronary artery without angina pectoris: Secondary | ICD-10-CM | POA: Diagnosis not present

## 2015-05-18 DIAGNOSIS — I10 Essential (primary) hypertension: Secondary | ICD-10-CM | POA: Diagnosis not present

## 2015-05-18 DIAGNOSIS — I482 Chronic atrial fibrillation: Secondary | ICD-10-CM | POA: Diagnosis not present

## 2015-05-18 DIAGNOSIS — I2510 Atherosclerotic heart disease of native coronary artery without angina pectoris: Secondary | ICD-10-CM | POA: Diagnosis not present

## 2015-05-18 DIAGNOSIS — E785 Hyperlipidemia, unspecified: Secondary | ICD-10-CM | POA: Diagnosis not present

## 2015-05-18 DIAGNOSIS — M1990 Unspecified osteoarthritis, unspecified site: Secondary | ICD-10-CM | POA: Diagnosis not present

## 2015-05-18 DIAGNOSIS — E119 Type 2 diabetes mellitus without complications: Secondary | ICD-10-CM | POA: Diagnosis not present

## 2015-05-18 DIAGNOSIS — I214 Non-ST elevation (NSTEMI) myocardial infarction: Secondary | ICD-10-CM | POA: Diagnosis not present

## 2015-05-18 DIAGNOSIS — Z9981 Dependence on supplemental oxygen: Secondary | ICD-10-CM | POA: Diagnosis not present

## 2015-05-18 DIAGNOSIS — I5040 Unspecified combined systolic (congestive) and diastolic (congestive) heart failure: Secondary | ICD-10-CM | POA: Diagnosis not present

## 2015-05-19 ENCOUNTER — Other Ambulatory Visit (HOSPITAL_COMMUNITY): Payer: Self-pay

## 2015-05-19 ENCOUNTER — Emergency Department (HOSPITAL_COMMUNITY): Payer: Commercial Managed Care - HMO

## 2015-05-19 ENCOUNTER — Encounter (HOSPITAL_COMMUNITY): Payer: Self-pay | Admitting: Emergency Medicine

## 2015-05-19 ENCOUNTER — Inpatient Hospital Stay (HOSPITAL_COMMUNITY)
Admission: EM | Admit: 2015-05-19 | Discharge: 2015-05-21 | DRG: 638 | Disposition: A | Discharge status: Home / Self Care | Payer: Commercial Managed Care - HMO | Attending: Cardiology | Admitting: Cardiology

## 2015-05-19 DIAGNOSIS — I272 Other secondary pulmonary hypertension: Secondary | ICD-10-CM | POA: Diagnosis present

## 2015-05-19 DIAGNOSIS — E875 Hyperkalemia: Secondary | ICD-10-CM | POA: Insufficient documentation

## 2015-05-19 DIAGNOSIS — I5032 Chronic diastolic (congestive) heart failure: Secondary | ICD-10-CM | POA: Diagnosis not present

## 2015-05-19 DIAGNOSIS — S43032A Inferior subluxation of left humerus, initial encounter: Secondary | ICD-10-CM | POA: Diagnosis not present

## 2015-05-19 DIAGNOSIS — I252 Old myocardial infarction: Secondary | ICD-10-CM

## 2015-05-19 DIAGNOSIS — R55 Syncope and collapse: Secondary | ICD-10-CM | POA: Diagnosis present

## 2015-05-19 DIAGNOSIS — E119 Type 2 diabetes mellitus without complications: Secondary | ICD-10-CM | POA: Diagnosis not present

## 2015-05-19 DIAGNOSIS — E1165 Type 2 diabetes mellitus with hyperglycemia: Secondary | ICD-10-CM | POA: Diagnosis present

## 2015-05-19 DIAGNOSIS — S43005A Unspecified dislocation of left shoulder joint, initial encounter: Secondary | ICD-10-CM

## 2015-05-19 DIAGNOSIS — Z9842 Cataract extraction status, left eye: Secondary | ICD-10-CM

## 2015-05-19 DIAGNOSIS — E1122 Type 2 diabetes mellitus with diabetic chronic kidney disease: Secondary | ICD-10-CM | POA: Diagnosis present

## 2015-05-19 DIAGNOSIS — E11649 Type 2 diabetes mellitus with hypoglycemia without coma: Secondary | ICD-10-CM | POA: Diagnosis not present

## 2015-05-19 DIAGNOSIS — I482 Chronic atrial fibrillation, unspecified: Secondary | ICD-10-CM | POA: Diagnosis present

## 2015-05-19 DIAGNOSIS — T383X5A Adverse effect of insulin and oral hypoglycemic [antidiabetic] drugs, initial encounter: Secondary | ICD-10-CM | POA: Diagnosis present

## 2015-05-19 DIAGNOSIS — R627 Adult failure to thrive: Secondary | ICD-10-CM | POA: Diagnosis present

## 2015-05-19 DIAGNOSIS — M1990 Unspecified osteoarthritis, unspecified site: Secondary | ICD-10-CM | POA: Diagnosis present

## 2015-05-19 DIAGNOSIS — D649 Anemia, unspecified: Secondary | ICD-10-CM | POA: Diagnosis present

## 2015-05-19 DIAGNOSIS — I10 Essential (primary) hypertension: Secondary | ICD-10-CM | POA: Diagnosis not present

## 2015-05-19 DIAGNOSIS — E162 Hypoglycemia, unspecified: Secondary | ICD-10-CM | POA: Diagnosis not present

## 2015-05-19 DIAGNOSIS — T383X1A Poisoning by insulin and oral hypoglycemic [antidiabetic] drugs, accidental (unintentional), initial encounter: Secondary | ICD-10-CM

## 2015-05-19 DIAGNOSIS — Z86718 Personal history of other venous thrombosis and embolism: Secondary | ICD-10-CM

## 2015-05-19 DIAGNOSIS — I129 Hypertensive chronic kidney disease with stage 1 through stage 4 chronic kidney disease, or unspecified chronic kidney disease: Secondary | ICD-10-CM | POA: Diagnosis present

## 2015-05-19 DIAGNOSIS — R0902 Hypoxemia: Secondary | ICD-10-CM | POA: Diagnosis present

## 2015-05-19 DIAGNOSIS — Z853 Personal history of malignant neoplasm of breast: Secondary | ICD-10-CM | POA: Diagnosis not present

## 2015-05-19 DIAGNOSIS — W19XXXA Unspecified fall, initial encounter: Secondary | ICD-10-CM

## 2015-05-19 DIAGNOSIS — I119 Hypertensive heart disease without heart failure: Secondary | ICD-10-CM | POA: Diagnosis present

## 2015-05-19 DIAGNOSIS — S43012A Anterior subluxation of left humerus, initial encounter: Secondary | ICD-10-CM | POA: Diagnosis not present

## 2015-05-19 DIAGNOSIS — S43015A Anterior dislocation of left humerus, initial encounter: Secondary | ICD-10-CM | POA: Diagnosis present

## 2015-05-19 DIAGNOSIS — I4891 Unspecified atrial fibrillation: Secondary | ICD-10-CM

## 2015-05-19 DIAGNOSIS — I481 Persistent atrial fibrillation: Secondary | ICD-10-CM | POA: Diagnosis not present

## 2015-05-19 DIAGNOSIS — K219 Gastro-esophageal reflux disease without esophagitis: Secondary | ICD-10-CM | POA: Diagnosis present

## 2015-05-19 DIAGNOSIS — Z923 Personal history of irradiation: Secondary | ICD-10-CM | POA: Diagnosis not present

## 2015-05-19 DIAGNOSIS — Z79810 Long term (current) use of selective estrogen receptor modulators (SERMs): Secondary | ICD-10-CM

## 2015-05-19 DIAGNOSIS — I071 Rheumatic tricuspid insufficiency: Secondary | ICD-10-CM | POA: Diagnosis present

## 2015-05-19 DIAGNOSIS — N183 Chronic kidney disease, stage 3 (moderate): Secondary | ICD-10-CM | POA: Diagnosis present

## 2015-05-19 DIAGNOSIS — E785 Hyperlipidemia, unspecified: Secondary | ICD-10-CM | POA: Diagnosis present

## 2015-05-19 DIAGNOSIS — I2510 Atherosclerotic heart disease of native coronary artery without angina pectoris: Secondary | ICD-10-CM | POA: Diagnosis not present

## 2015-05-19 DIAGNOSIS — Z8673 Personal history of transient ischemic attack (TIA), and cerebral infarction without residual deficits: Secondary | ICD-10-CM

## 2015-05-19 DIAGNOSIS — Z91018 Allergy to other foods: Secondary | ICD-10-CM | POA: Diagnosis not present

## 2015-05-19 DIAGNOSIS — I5042 Chronic combined systolic (congestive) and diastolic (congestive) heart failure: Secondary | ICD-10-CM | POA: Diagnosis present

## 2015-05-19 DIAGNOSIS — T383X3A Poisoning by insulin and oral hypoglycemic [antidiabetic] drugs, assault, initial encounter: Secondary | ICD-10-CM | POA: Diagnosis not present

## 2015-05-19 DIAGNOSIS — S43032D Inferior subluxation of left humerus, subsequent encounter: Secondary | ICD-10-CM | POA: Diagnosis not present

## 2015-05-19 DIAGNOSIS — M25522 Pain in left elbow: Secondary | ICD-10-CM | POA: Diagnosis not present

## 2015-05-19 DIAGNOSIS — M25512 Pain in left shoulder: Secondary | ICD-10-CM

## 2015-05-19 DIAGNOSIS — E160 Drug-induced hypoglycemia without coma: Secondary | ICD-10-CM | POA: Diagnosis present

## 2015-05-19 DIAGNOSIS — S199XXA Unspecified injury of neck, initial encounter: Secondary | ICD-10-CM | POA: Diagnosis not present

## 2015-05-19 DIAGNOSIS — S0993XA Unspecified injury of face, initial encounter: Secondary | ICD-10-CM | POA: Diagnosis not present

## 2015-05-19 DIAGNOSIS — F0390 Unspecified dementia without behavioral disturbance: Secondary | ICD-10-CM | POA: Diagnosis not present

## 2015-05-19 DIAGNOSIS — W1830XA Fall on same level, unspecified, initial encounter: Secondary | ICD-10-CM | POA: Diagnosis not present

## 2015-05-19 DIAGNOSIS — S43015D Anterior dislocation of left humerus, subsequent encounter: Secondary | ICD-10-CM | POA: Diagnosis not present

## 2015-05-19 DIAGNOSIS — E876 Hypokalemia: Secondary | ICD-10-CM | POA: Diagnosis present

## 2015-05-19 DIAGNOSIS — Z7901 Long term (current) use of anticoagulants: Secondary | ICD-10-CM | POA: Diagnosis not present

## 2015-05-19 DIAGNOSIS — S43012D Anterior subluxation of left humerus, subsequent encounter: Secondary | ICD-10-CM | POA: Diagnosis not present

## 2015-05-19 DIAGNOSIS — S0990XA Unspecified injury of head, initial encounter: Secondary | ICD-10-CM | POA: Diagnosis not present

## 2015-05-19 LAB — I-STAT CHEM 8, ED
BUN: 19 mg/dL (ref 6–20)
CHLORIDE: 99 mmol/L — AB (ref 101–111)
Calcium, Ion: 1.1 mmol/L — ABNORMAL LOW (ref 1.13–1.30)
Creatinine, Ser: 0.9 mg/dL (ref 0.44–1.00)
Glucose, Bld: 37 mg/dL — CL (ref 65–99)
HCT: 41 % (ref 36.0–46.0)
HEMOGLOBIN: 13.9 g/dL (ref 12.0–15.0)
POTASSIUM: 5.1 mmol/L (ref 3.5–5.1)
SODIUM: 139 mmol/L (ref 135–145)
TCO2: 29 mmol/L (ref 0–100)

## 2015-05-19 LAB — BASIC METABOLIC PANEL
Anion gap: 12 (ref 5–15)
BUN: 13 mg/dL (ref 6–20)
CO2: 24 mmol/L (ref 22–32)
Calcium: 8.3 mg/dL — ABNORMAL LOW (ref 8.9–10.3)
Chloride: 100 mmol/L — ABNORMAL LOW (ref 101–111)
Creatinine, Ser: 0.97 mg/dL (ref 0.44–1.00)
GFR calc Af Amer: 60 mL/min — ABNORMAL LOW (ref >60)
GFR, EST NON AFRICAN AMERICAN: 51 mL/min — AB (ref >60)
Glucose, Bld: 32 mg/dL — CL (ref 65–99)
Potassium: 5.3 mmol/L — ABNORMAL HIGH (ref 3.5–5.1)
Sodium: 136 mmol/L (ref 135–145)

## 2015-05-19 LAB — GLUCOSE, CAPILLARY
GLUCOSE-CAPILLARY: 227 mg/dL — AB (ref 65–99)
Glucose-Capillary: 278 mg/dL — ABNORMAL HIGH (ref 65–99)

## 2015-05-19 LAB — I-STAT TROPONIN, ED: TROPONIN I, POC: 0.01 ng/mL (ref 0.00–0.08)

## 2015-05-19 LAB — CBG MONITORING, ED
GLUCOSE-CAPILLARY: 17 mg/dL — AB (ref 65–99)
GLUCOSE-CAPILLARY: 91 mg/dL (ref 65–99)
GLUCOSE-CAPILLARY: 107 mg/dL — AB (ref 65–99)
GLUCOSE-CAPILLARY: 154 mg/dL — AB (ref 65–99)
Glucose-Capillary: 25 mg/dL — CL (ref 65–99)
Glucose-Capillary: 120 mg/dL — ABNORMAL HIGH (ref 65–99)
Glucose-Capillary: 90 mg/dL (ref 65–99)
Glucose-Capillary: 93 mg/dL (ref 65–99)
Glucose-Capillary: 137 mg/dL — ABNORMAL HIGH (ref 65–99)

## 2015-05-19 LAB — PROTIME-INR
INR: 1.64 — AB (ref 0.00–1.49)
Prothrombin Time: 19.4 seconds — ABNORMAL HIGH (ref 11.6–15.2)

## 2015-05-19 LAB — CK: CK TOTAL: 132 U/L (ref 38–234)

## 2015-05-19 LAB — CBC
HCT: 38.6 % (ref 36.0–46.0)
Hemoglobin: 11.9 g/dL — ABNORMAL LOW (ref 12.0–15.0)
MCH: 29.6 pg (ref 26.0–34.0)
MCHC: 30.8 g/dL (ref 30.0–36.0)
MCV: 96 fL (ref 78.0–100.0)
Platelets: 147 10*3/uL — ABNORMAL LOW (ref 150–400)
RBC: 4.02 MIL/uL (ref 3.87–5.11)
RDW: 16.4 % — ABNORMAL HIGH (ref 11.5–15.5)
WBC: 6.8 10*3/uL (ref 4.0–10.5)

## 2015-05-19 LAB — I-STAT CG4 LACTIC ACID, ED: Lactic Acid, Venous: 1.52 mmol/L (ref 0.5–2.0)

## 2015-05-19 MED ORDER — ACETAMINOPHEN 325 MG PO TABS: 650.0000 mg | ORAL_TABLET | ORAL | Status: DC | PRN

## 2015-05-19 MED ORDER — WARFARIN SODIUM 5 MG PO TABS
5.0000 mg | ORAL_TABLET | Freq: Once | ORAL | Status: AC
  Administered 2015-05-19: 5 mg via ORAL
  Filled 2015-05-19 (×2): qty 1

## 2015-05-19 MED ORDER — FENTANYL CITRATE (PF) 100 MCG/2ML IJ SOLN: 25.0000 ug | Freq: Once | INTRAMUSCULAR | Status: DC

## 2015-05-19 MED ORDER — CARVEDILOL 3.125 MG PO TABS
3.1250 mg | ORAL_TABLET | Freq: Two times a day (BID) | ORAL | Status: DC
  Administered 2015-05-19 – 2015-05-20 (×2): 3.125 mg via ORAL
  Filled 2015-05-19 (×4): qty 1

## 2015-05-19 MED ORDER — PANTOPRAZOLE SODIUM 40 MG PO TBEC
40.0000 mg | DELAYED_RELEASE_TABLET | Freq: Two times a day (BID) | ORAL | Status: DC
  Administered 2015-05-19 – 2015-05-21 (×4): 40 mg via ORAL
  Filled 2015-05-19 (×4): qty 1

## 2015-05-19 MED ORDER — PROPOFOL 10 MG/ML IV BOLUS
0.5000 mg/kg | Freq: Once | INTRAVENOUS | Status: DC
Start: 2015-05-19 — End: 2015-05-19
  Filled 2015-05-19: qty 20

## 2015-05-19 MED ORDER — OFLOXACIN 0.3 % OP SOLN
1.0000 [drp] | Freq: Every day | OPHTHALMIC | Status: DC
  Administered 2015-05-19 – 2015-05-21 (×3): 1 [drp] via OPHTHALMIC
  Filled 2015-05-19: qty 5

## 2015-05-19 MED ORDER — OCTREOTIDE ACETATE 100 MCG/ML IJ SOLN
100.0000 ug | Freq: Once | INTRAMUSCULAR | Status: AC
Start: 2015-05-19 — End: 2015-05-19
  Administered 2015-05-19: 100 ug via INTRAVENOUS
  Filled 2015-05-19: qty 1

## 2015-05-19 MED ORDER — DEXTROSE 50 % IV SOLN
25.0000 g | Freq: Once | INTRAVENOUS | Status: AC
Start: 2015-05-19 — End: 2015-05-19
  Administered 2015-05-19: 25 g via INTRAVENOUS

## 2015-05-19 MED ORDER — INSULIN ASPART 100 UNIT/ML ~~LOC~~ SOLN
0.0000 [IU] | SUBCUTANEOUS | Status: DC
  Administered 2015-05-19: 3 [IU] via SUBCUTANEOUS
  Administered 2015-05-19: 5 [IU] via SUBCUTANEOUS
  Administered 2015-05-20: 3 [IU] via SUBCUTANEOUS
  Administered 2015-05-20: 7 [IU] via SUBCUTANEOUS
  Administered 2015-05-20 (×2): 1 [IU] via SUBCUTANEOUS
  Administered 2015-05-20: 3 [IU] via SUBCUTANEOUS
  Administered 2015-05-20 – 2015-05-21 (×2): 1 [IU] via SUBCUTANEOUS

## 2015-05-19 MED ORDER — FUROSEMIDE 40 MG PO TABS
40.0000 mg | ORAL_TABLET | Freq: Every day | ORAL | Status: DC
Start: 2015-05-19 — End: 2015-05-21
  Administered 2015-05-19 – 2015-05-21 (×3): 40 mg via ORAL
  Filled 2015-05-19 (×3): qty 1

## 2015-05-19 MED ORDER — PROPOFOL 10 MG/ML IV BOLUS
INTRAVENOUS | Status: AC | PRN
  Administered 2015-05-19: 50 mg via INTRAVENOUS
  Administered 2015-05-19: 40 mg via INTRAVENOUS

## 2015-05-19 MED ORDER — GABAPENTIN 300 MG PO CAPS
300.0000 mg | ORAL_CAPSULE | ORAL | Status: DC
  Administered 2015-05-20: 300 mg via ORAL
  Filled 2015-05-19 (×4): qty 1

## 2015-05-19 MED ORDER — DEXTROSE 50 % IV SOLN
INTRAVENOUS | Status: AC
  Filled 2015-05-19: qty 50

## 2015-05-19 MED ORDER — POTASSIUM CHLORIDE CRYS ER 20 MEQ PO TBCR: 20.0000 meq | EXTENDED_RELEASE_TABLET | Freq: Every day | ORAL | Status: DC

## 2015-05-19 MED ORDER — OXYCODONE HCL 5 MG PO TABS: 5.0000 mg | ORAL_TABLET | ORAL | Status: DC | PRN

## 2015-05-19 MED ORDER — DIGOXIN 125 MCG PO TABS
125.0000 ug | ORAL_TABLET | Freq: Every day | ORAL | Status: DC
  Administered 2015-05-19 – 2015-05-20 (×2): 125 ug via ORAL
  Filled 2015-05-19 (×2): qty 1

## 2015-05-19 MED ORDER — ATORVASTATIN CALCIUM 20 MG PO TABS
20.0000 mg | ORAL_TABLET | Freq: Every day | ORAL | Status: DC
  Administered 2015-05-19 – 2015-05-21 (×3): 20 mg via ORAL
  Filled 2015-05-19 (×3): qty 1

## 2015-05-19 MED ORDER — KCL IN DEXTROSE-NACL 20-5-0.9 MEQ/L-%-% IV SOLN
INTRAVENOUS | Status: DC
  Filled 2015-05-19: qty 1000

## 2015-05-19 MED ORDER — DEXTROSE 50 % IV SOLN: 50.0000 mL | Freq: Once | INTRAVENOUS | Status: DC

## 2015-05-19 MED ORDER — PREDNISOLONE ACETATE 1 % OP SUSP
1.0000 [drp] | Freq: Every day | OPHTHALMIC | Status: DC
Start: 2015-05-19 — End: 2015-05-21
  Administered 2015-05-19 – 2015-05-21 (×3): 1 [drp] via OPHTHALMIC
  Filled 2015-05-19: qty 1

## 2015-05-19 MED ORDER — KETOROLAC TROMETHAMINE 0.5 % OP SOLN
1.0000 [drp] | Freq: Every day | OPHTHALMIC | Status: DC
  Administered 2015-05-19 – 2015-05-21 (×3): 1 [drp] via OPHTHALMIC
  Filled 2015-05-19: qty 3

## 2015-05-19 MED ORDER — ONDANSETRON 4 MG PO TBDP
4.0000 mg | ORAL_TABLET | Freq: Once | ORAL | Status: AC
  Administered 2015-05-19: 4 mg via ORAL
  Filled 2015-05-19: qty 1

## 2015-05-19 MED ORDER — DEXTROSE-NACL 5-0.9 % IV SOLN: INTRAVENOUS | Status: DC

## 2015-05-19 MED ORDER — WARFARIN - PHARMACIST DOSING INPATIENT
Freq: Every day | Status: DC
  Administered 2015-05-19 – 2015-05-20 (×2)

## 2015-05-19 MED ORDER — DEXTROSE-NACL 5-0.9 % IV SOLN
Freq: Once | INTRAVENOUS | Status: AC
  Administered 2015-05-19: 11:00:00 via INTRAVENOUS

## 2015-05-19 NOTE — ED Provider Notes (Signed)
CSN: [id number]     Arrival date & time 05/19/15  0554 History   First MD Initiated Contact with Patient 05/19/15 [phone]     Chief Complaint  Patient presents with  . Hypoglycemia  . Fall     (Consider location/radiation/quality/duration/timing/severity/associated sxs/prior Treatment) HPI  [name] Is an 81 year old female with a past medical history of hypertension, diabetes, CHF. She was recently in the hospital for repeated syncopal events due to cardiac arrhythmias. Patient presents emergency department for fall. Her daughter states the last time she saw her up was around 11 PM last night. The patient states she is not sure how she fell but she may have syncopized again. She was on the floor for an unknown amount of time. She is chronically anticoagulated on Coumadin for chronic atrial fibrillation. She complains of left elbow and left shoulder pain. She also has noticeable bruising to the front of her right forehead and brow area. She denies any changes in vision, headache. She denies any numbness or tingling of the left arm, but states she is unable to move the elbow or shoulder. Patient notably was found to have a blood sugar of 17. Upon arrival by EMS. She was given oral glucose en route. Initial blood sugar upon arrival was 17 and 25. Patient was given D50 via IV. We'll continue to monitor her blood sugars. She is on a soft fontanelle. Urea and metformin. Patient is not on any insulin.  Past Medical History  Diagnosis Date  . 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 DVT (deep venous thrombosis)   . Arthritis   . Hyperlipidemia   . Hypertension   . CHF (congestive heart failure)   . Anemia   . Cholelithiasis   . GI bleed   . Breast cancer 2012    right  . Peripheral neuropathy   . Heart murmur   . Syncope 05/09/2015  . Shortness of breath dyspnea   . Diabetes mellitus   . GERD (gastroesophageal reflux disease)      Past Surgical History  Procedure Laterality Date  . Breast surgery    . Esophagogastroduodenoscopy  09/01/2012    Procedure: ESOPHAGOGASTRODUODENOSCOPY (EGD);  Surgeon: Ladene Artist, MD,FACG;  Location: [facility] ENDOSCOPY;  Service: Endoscopy;  Laterality: N/A;  . Esophagogastroduodenoscopy  09/03/2012    Procedure: ESOPHAGOGASTRODUODENOSCOPY (EGD);  Surgeon: Inda Castle, MD;  Location: [place];  Service: Endoscopy;  Laterality: N/A;  . Endoscopic retrograde cholangiopancreatography (ercp) with propofol  10/14/2012    Procedure: ENDOSCOPIC RETROGRADE CHOLANGIOPANCREATOGRAPHY (ERCP) WITH PROPOFOL;  Surgeon: Milus Banister, MD;  Location: [facility] ENDOSCOPY;  Service: Endoscopy;  Laterality: N/A;  . Eus  10/14/2012    Procedure: UPPER ENDOSCOPIC ULTRASOUND (EUS) LINEAR;  Surgeon: Milus Banister, MD;  Location: [facility] ENDOSCOPY;  Service: Endoscopy;  Laterality: N/A;  . Eus  11/11/2012    Procedure: UPPER ENDOSCOPIC ULTRASOUND (EUS) LINEAR;  Surgeon: Milus Banister, MD;  Location: [facility] ENDOSCOPY;  Service: Endoscopy;  Laterality: N/A;  . Cataract extraction Left 05/08/2015  . Cardiac catheterization N/A 05/11/2015    Procedure: Left Heart Cath and Coronary Angiography;  Surgeon: [name], MD;  Location: [place] [facility];  Service: Cardiovascular;  Laterality: N/A;   History reviewed. No pertinent family history. History  Substance Use Topics  . Smoking status: Never Smoker   . Smokeless tobacco: Never Used  . Alcohol Use: No   OB History  No data available     Review of Systems  Ten systems reviewed and are negative for acute change, except as noted in the HPI.    Allergies  [place]  Home Medications   Prior to Admission medications   Medication Sig Start Date End Date Taking? Authorizing Provider  atorvastatin (LIPITOR) 20 MG tablet Take 20 mg by mouth daily.   Yes Historical Provider, MD  carvedilol (COREG) 3.125 MG tablet Take 1 tablet (3.125 mg total) by mouth 2 (two) times  daily with a meal. 05/13/15  Yes Dixie Dials, MD  digoxin (LANOXIN) 0.125 MG tablet Take 125 mcg by mouth daily.   Yes Historical Provider, MD  furosemide (LASIX) 20 MG tablet Take 40 mg by mouth daily.    Yes Historical Provider, MD  gabapentin (NEURONTIN) 300 MG capsule Take 300 mg by mouth every morning.  04/09/15  Yes Historical Provider, MD  glimepiride (AMARYL) 2 MG tablet Take 2 mg by mouth daily before breakfast.   Yes Historical Provider, MD  ketorolac (ACULAR) 0.5 % ophthalmic solution Place 1 drop into the left eye daily.  04/24/15  Yes Historical Provider, MD  ofloxacin (OCUFLOX) 0.3 % ophthalmic solution Place 1 drop into the left eye daily.  04/24/15  Yes Historical Provider, MD  pantoprazole (PROTONIX) 40 MG tablet Take 1 tablet (40 mg total) by mouth 2 (two) times daily before a meal. 09/07/12  Yes [name], MD  potassium chloride SA (K-DUR,KLOR-CON) 20 MEQ tablet Take 20 mEq by mouth daily.    Yes Historical Provider, MD  prednisoLONE acetate (PRED FORTE) 1 % ophthalmic suspension Place 1 drop into the left eye daily.    Yes Historical Provider, MD  warfarin (COUMADIN) 2 MG tablet Take 2-4 mg by mouth daily. Takes 1 tablet daily, except takes 2 tablets on Thursdays and and Saturdays.   Yes Historical Provider, MD   There were no vitals taken for this visit. Physical Exam  Constitutional: She appears well-developed and well-nourished. No distress.  HENT:  Head: Normocephalic.    Right Ear: Tympanic membrane and external ear normal. No drainage.  Left Ear: Tympanic membrane and external ear normal. No drainage.  Nose: Nose normal. No nasal septal hematoma.    Mouth/Throat: Uvula is midline. Normal dentition.  Eyes: Conjunctivae and EOM are normal. Pupils are equal, round, and reactive to light.  Neck: Normal range of motion.  No midline spinal tenderness  Cardiovascular: Normal rate and normal heart sounds.   Pulmonary/Chest: Effort normal and breath sounds normal. No  respiratory distress. She has no wheezes. She exhibits no tenderness.  No chest wall tenderness or crepitus  Abdominal: Soft. She exhibits no distension. There is no tenderness.  No bruising  Musculoskeletal:  Left elbow with exquisite tenderness to palpation on the condyles. Patient is unable to flex or extend. She is exquisitely tender to palpation over the distal clavicle and before meals joint. Unable to move the shoulder. No midshaft humeral tenderness. Distal pulses intact. Sensation normal with good grip strength.  Skin: She is not diaphoretic.  Nursing note and vitals reviewed.   ED Course  Reduction of dislocation Date/Time: 05/19/2015 4:36 PM Performed by: Margarita Mail Authorized by: Margarita Mail Consent: Written consent obtained. Risks and benefits: risks, benefits and alternatives were discussed Consent given by: patient Patient understanding: patient states understanding of the procedure being performed Patient identity confirmed: provided demographic data and verbally with patient Time out: Immediately prior to procedure a "time out" was called to verify the  correct patient, procedure, equipment, support staff and site/side marked as required. Preparation: Patient was prepped and draped in the usual sterile fashion. Patient sedated: yes Sedatives: propofol and see MAR for details Sedation start date/time: 05/19/2015 10:20 AM Sedation end date/time: 05/19/2015 10:35 AM   (including critical care time) Labs Review Labs Reviewed  PROTIME-INR - Abnormal; Notable for the following:    Prothrombin Time 19.4 (*)    INR 1.64 (*)    All other components within normal limits  CBG MONITORING, ED - Abnormal; Notable for the following:    Glucose-Capillary 17 (*)    All other components within normal limits  CBG MONITORING, ED - Abnormal; Notable for the following:    Glucose-Capillary 25 (*)    All other components within normal limits  CBG MONITORING, ED - Abnormal;  Notable for the following:    Glucose-Capillary 120 (*)    All other components within normal limits  I-STAT CHEM 8, ED - Abnormal; Notable for the following:    Chloride 99 (*)    Glucose, Bld 37 (*)    Calcium, Ion 1.10 (*)    All other components within normal limits  BASIC METABOLIC PANEL  CK  I-STAT TROPOININ, ED  I-STAT CG4 LACTIC ACID, ED    Imaging Review No results found.   EKG Interpretation None       ED ECG REPORT   Date: 05/19/2015  Rate: 65  Rhythm: atrial fibrillation  QRS Axis: normal  Intervals: normal  ST/T Wave abnormalities: nonspecific T wave changes  Conduction Disutrbances:none  Narrative Interpretation:   Old EKG Reviewed: unchanged  I have personally reviewed the EKG tracing and agree with the computerized printout as noted.   MDM   Final diagnoses:  Fall  Elbow pain, left  Shoulder pain, acute, left  Hypoglycemia  Shoulder dislocation, left, initial encounter    7:29 AM There were no vitals taken for this visit.  Patient here for fall. On the ground for an unknown amount of time between 11 PM and 4 AM. Initial blood sugars very low. However, she is alert and oriented. She is subtherapeutic on her INR at 1.6. Troponin is negative. CK and imaging are in process to rule out rhabdo. Ekg is unchanged.  11:49 AM BP 148/69 mmHg  Pulse 85  Resp 22  SpO2 98% HEENT with hypoglycemia after D50. She was given octreotide. He milligrams IV. 2 over, sulfonylurea induced hypoglycemia. She is placed on a D5 drip at 75 Her blood sugars are trending upward. Left shoulder successfully reduced in the emergency department. Patient placed in left shoulder immobilizer. The patient will need at least observation admission to monitor her blood sugar levels. Questionable syncope as the cause of her fall today. She otherwise appears stable.  Margarita Mail, PA-C 05/20/15 [id number]  Varney Biles, MD 05/21/15 0002

## 2015-05-19 NOTE — Sedation Documentation (Signed)
Will confirm reduction with x-ray.

## 2015-05-19 NOTE — H&P (Addendum)
Patient was seen, examined, treatment plan was discussed with the Physician extender. I have directly reviewed the clinical findings, lab, imaging studies and management of this patient in detail. I have made the necessary changes to the above noted documentation, and agree with the documentation, as recorded by the Physician extender.  Brief narrative 81 year old female with past medical history of hypertension, diabetes, chronic atrial fibrillation, chronic systolic and diastolic CHF (last 2 D ECHO in 05/2015 with EF of 55%), dyslipidemia, breast cancer status post radiation therapy to the right breast completed in 10/2010 (was on tamoxifen and this was stopped in 11/2010 due to development of right leg DVT, thwn on aromasin but that was stopped too due to intolerance). Pt presented to [facility] ED after she was found by her family lying on the floor. [facility] arrived, apparently patient's CBG was 17 for which she was given oral glucose and D50. There was no apparent respiratory distress. Pt came to consciousness. She is not a good historian due to lethargy.  Of note, her most recent hospitalization was from 05/09/15 through 05/13/2015 for evaluation of syncope and she underwent cardiac cath at that time. Cardiac cath showed mild CAD at that time.  In ED, pt was hemodynamically stable, Her CBG's on admission were 32, 37 but this has improved with D50 to 120. Maxillofacial CT and CT of the head showed scalp hematoma otherwise no acute abnormalities seen.  Left shoulder X ray showed anterior inferior dislocation of the left humeral head and this was reduced in ED   Assessment & Plan  Principal Problem: Syncope and collapse / Fall - Likely from hypoglycemia considering extremely low CBG's upon EMS arrival and even in ED - After D50 given in ED CBG's improved to 120 - Will hold amaryl and use on SSI if needed - PT evaluation once pt able to participate   Active Problems: Diabetes mellitus, controlled with renal  manifestations - Recent A1c WNL - Ok to liberalize the diet for this elderly female - Would consider holding amaryl all together on discharge to prevent further hypoglycemic events   CKD, stage 3 - Recent baseline Cr 1.5 and on this admission within normal limit - Stable   Anterior dislocation of left shoulder - Has been reduced in the ER  Essential HTN (hypertension) - Continue carvedilol and lasix  ATRIAL FIBRILLATION, CHRONIC - CHADVASc = 7 - Continue carvedilol and digoxin - On full dose anticoagulation with coumadin - INR 4.19  Chronic systolic and diastolic CHF - As mentioned above, most recent ECHO with EF of 62%, grade 3 diastolic dysfunction  - Follows with Dr. Terrence Dupont - Compensated CHF at this time   Hyperkalemia - Likely from potassium supplementation - Spontaneously resolved and now potassium WNL    Leisa Lenz [facility] [phone]  *For further details on admission note please refer to the note below done by physician extender    [facility] History and Physical                                                                                    Sharlot Sturkey, is a 81 y.o. female  MRN: [id number]   DOB -  [birth year]  Admit Date - 05/19/2015  Outpatient Primary MD for the patient is Maximino Greenland, MD  Referring MD: Kathrynn Humble / ER  With History of -  Past Medical History  Diagnosis Date  . 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 DVT (deep venous thrombosis)   . Arthritis   . Hyperlipidemia   . Hypertension   . CHF (congestive heart failure)   . Anemia   . Cholelithiasis   . GI bleed   . Breast cancer 2012    right  . Peripheral neuropathy   . Heart murmur   . Syncope 05/09/2015  . Shortness of breath dyspnea   . Diabetes mellitus   . GERD (gastroesophageal reflux disease)       Past Surgical History  Procedure Laterality Date  . Breast surgery    . Esophagogastroduodenoscopy   09/01/2012    Procedure: ESOPHAGOGASTRODUODENOSCOPY (EGD);  Surgeon: Ladene Artist, MD,FACG;  Location: [facility] ENDOSCOPY;  Service: Endoscopy;  Laterality: N/A;  . Esophagogastroduodenoscopy  09/03/2012    Procedure: ESOPHAGOGASTRODUODENOSCOPY (EGD);  Surgeon: Inda Castle, MD;  Location: [facility];  Service: Endoscopy;  Laterality: N/A;  . Endoscopic retrograde cholangiopancreatography (ercp) with propofol  10/14/2012    Procedure: ENDOSCOPIC RETROGRADE CHOLANGIOPANCREATOGRAPHY (ERCP) WITH PROPOFOL;  Surgeon: Milus Banister, MD;  Location: [facility] ENDOSCOPY;  Service: Endoscopy;  Laterality: N/A;  . Eus  10/14/2012    Procedure: UPPER ENDOSCOPIC ULTRASOUND (EUS) LINEAR;  Surgeon: Milus Banister, MD;  Location: [facility] ENDOSCOPY;  Service: Endoscopy;  Laterality: N/A;  . Eus  11/11/2012    Procedure: UPPER ENDOSCOPIC ULTRASOUND (EUS) LINEAR;  Surgeon: Milus Banister, MD;  Location: [facility] ENDOSCOPY;  Service: Endoscopy;  Laterality: N/A;  . Cataract extraction Left 05/08/2015  . Cardiac catheterization N/A 05/11/2015    Procedure: Left Heart Cath and Coronary Angiography;  Surgeon: [name], MD;  Location: [place] [facility];  Service: Cardiovascular;  Laterality: N/A;     Chief Complaint  Patient presents with  . Hypoglycemia  . Fall     HPI This is an 81 year old female patient most recently discharged on 6/5 after admission for recurrent syncope felt to possibly be related to cardiac arrhythmia. During that admission she had mild compensated combined systolic and diastolic heart failure as well as concerns over possible UTI. On culture was negative and no definitive arrhythmia identified prior to discharge. During that hospitalization she underwent a cardiac catheterization that revealed mild coronary artery disease. During PT evaluation she was found to be hypoxemic with activity so oxygen was prescribed and she was discharged home with her daughter and was informed to follow-up with her primary  care physician in one week. This morning the patient's daughter found the patient on the floor for unknown period of time last seen around 11 PM last night. Patient was complaining of left elbow and left shoulder pain. EMS was called to the home and patient was found to have a blood sugar of 17 and she was given oral glucose. Repeat CBG was 25 and she was subsequently given D50 IV.  In the ER she was afebrile and hemodynamically stable and maintaining O2 saturations are 100% on room air. Initial repeat CBG was 37 so she was given additional dextrose IV with follow-up CBG 120. Medication list reviewed and patient noted to be on sulfonylurea so was started on dextrose containing fluids. CT of the head, maxillofacial and cervical spine without acute  injury with only expected for head hematoma. Left elbow films were unremarkable but left shoulder film revealed anterior dislocation of the left humeral head. She was given Diprivan and successful reduction of the dislocation was performed in the ER with follow-up films confirming appropriate placement. In talking with the patient she reports being sleepy and tired and she does not remember falling. Since coming home from the hospital she has not noticed any chest pain or palpitations. She does note a poor appetite. Patient unclear as to whether she's been utilizing her oxygen regularly or not. She continues to have some left shoulder pain post reduction level 2/10.   Review of Systems   In addition to the HPI above,  No Fever-chills, myalgias or other constitutional symptoms No Headache, changes with Vision or hearing, new focal weakness, tingling, numbness in any extremity, No problems swallowing food or Liquids, indigestion/reflux No Chest pain, Cough or Shortness of Breath, palpitations, orthopnea or DOE No Abdominal pain, N/V; no melena or hematochezia, no dark tarry stools, Bowel movements are regular, No dysuria, hematuria or flank pain No new skin  rashes, lesions, masses or bruises, No recent weight gain or loss No polyuria, polydypsia or polyphagia,  *A full 10 point Review of Systems was done, except as stated above, all other Review of Systems were negative.  Social History History  Substance Use Topics  . Smoking status: Never Smoker   . Smokeless tobacco: Never Used  . Alcohol Use: No    Resides at: Private residence  Lives with: Daughter  Ambulatory status: During last admission physical therapy recommended rolling walker with when necessary oxygen with ambulation noting that skilled nursing facility would be best option if family not able to provide 24/7 care at home.   Family History History reviewed. No pertinent family history obtainable from chart. Given patient's advanced age and current symptoms and admitting diagnosis family history would not be contributory to current admission.   Prior to Admission medications   Medication Sig Start Date End Date Taking? Authorizing Provider  atorvastatin (LIPITOR) 20 MG tablet Take 20 mg by mouth daily.   Yes Historical Provider, MD  carvedilol (COREG) 3.125 MG tablet Take 1 tablet (3.125 mg total) by mouth 2 (two) times daily with a meal. 05/13/15  Yes Dixie Dials, MD  digoxin (LANOXIN) 0.125 MG tablet Take 125 mcg by mouth daily.   Yes Historical Provider, MD  furosemide (LASIX) 20 MG tablet Take 40 mg by mouth daily.    Yes Historical Provider, MD  gabapentin (NEURONTIN) 300 MG capsule Take 300 mg by mouth every morning.  04/09/15  Yes Historical Provider, MD  glimepiride (AMARYL) 2 MG tablet Take 2 mg by mouth daily before breakfast.   Yes Historical Provider, MD  ketorolac (ACULAR) 0.5 % ophthalmic solution Place 1 drop into the left eye daily.  04/24/15  Yes Historical Provider, MD  ofloxacin (OCUFLOX) 0.3 % ophthalmic solution Place 1 drop into the left eye daily.  04/24/15  Yes Historical Provider, MD  pantoprazole (PROTONIX) 40 MG tablet Take 1 tablet (40 mg total) by  mouth 2 (two) times daily before a meal. 09/07/12  Yes [name], MD  potassium chloride SA (K-DUR,KLOR-CON) 20 MEQ tablet Take 20 mEq by mouth daily.    Yes Historical Provider, MD  prednisoLONE acetate (PRED FORTE) 1 % ophthalmic suspension Place 1 drop into the left eye daily.    Yes Historical Provider, MD  warfarin (COUMADIN) 2 MG tablet Take 2-4 mg by mouth daily. Takes  1 tablet daily, except takes 2 tablets on Thursdays and and Saturdays.   Yes Historical Provider, MD    Allergies  Allergen Reactions  . Peach [Prunus Persica] Rash    AGENT: peach fuzz    Physical Exam  Vitals  Blood pressure 176/68, pulse 52, resp. rate 28, SpO2 100 %.   General:  In no acute distress, appears chronically ill and stated age  Psych: Somewhat flat affect but did smile when joked with, oriented times name and place but not to year  Neuro:   No focal neurological deficits, CN II through XII intact, Strength 5/5 all 4 extremities noting left upper extremity slightly weaker secondary to pain in relation to recent dislocation of shoulder, Sensation intact all 4 extremities.  ENT:  Ears and Eyes appear Normal, Conjunctivae clear, PER. Moist oral mucosa without erythema or exudates.  Neck:  Supple, No lymphadenopathy appreciated  Respiratory:  Symmetrical chest wall movement, Good air movement bilaterally, CTAB. Room Air  Cardiac:  RRR, No Murmurs, no LE edema noted, no JVD, No carotid bruits, peripheral pulses palpable at 2+  Abdomen:  Positive bowel sounds, Soft, Non tender, Non distended,  No masses appreciated, no obvious hepatosplenomegaly  Skin:  No Cyanosis, Normal Skin Turgor, No Skin Rash or Bruise.  Extremities: Symmetrical without obvious trauma or injury,  no effusions.  Data Review  CBC  Recent Labs Lab 05/19/15 0648  HGB 13.9  HCT 41.0    Chemistries   Recent Labs Lab 05/19/15 0645 05/19/15 0648  NA 136 139  K 5.3* 5.1  CL 100* 99*  CO2 24  --   GLUCOSE 32*  37*  BUN 13 19  CREATININE 0.97 0.90  CALCIUM 8.3*  --     estimated creatinine clearance is 51.4 mL/min (by C-G formula based on Cr of 0.9).  No results for input(s): TSH, T4TOTAL, T3FREE, THYROIDAB in the last 72 hours.  Invalid input(s): FREET3  Coagulation profile  Recent Labs Lab 05/19/15 0645  INR 1.64*    No results for input(s): DDIMER in the last 72 hours.  Cardiac Enzymes No results for input(s): CKMB, TROPONINI, MYOGLOBIN in the last 168 hours.  Invalid input(s): CK  Invalid input(s): POCBNP  Urinalysis    Component Value Date/Time   COLORURINE YELLOW 05/10/2015 0400   APPEARANCEUR CLOUDY* 05/10/2015 0400   LABSPEC 1.016 05/10/2015 0400   PHURINE 5.0 05/10/2015 0400   GLUCOSEU NEGATIVE 05/10/2015 0400   HGBUR NEGATIVE 05/10/2015 0400   BILIRUBINUR NEGATIVE 05/10/2015 0400   KETONESUR NEGATIVE 05/10/2015 0400   PROTEINUR NEGATIVE 05/10/2015 0400   UROBILINOGEN 0.2 05/10/2015 0400   NITRITE NEGATIVE 05/10/2015 0400   LEUKOCYTESUR SMALL* 05/10/2015 0400    Imaging results:   Dg Chest 2 View  05/09/2015   CLINICAL DATA:  [address] off toilet, syncopal episode.  EXAM: CHEST  2 VIEW  COMPARISON:  Chest radiograph October 31, 2013  FINDINGS: The cardiac silhouette appears moderately enlarged, tortuous calcified aorta. Increased diffuse interstitial prominence with pulmonary vascular congestion. Elevated RIGHT hemidiaphragm. No pleural effusion. No focal consolidation. No pneumothorax. Patient is osteopenic. Soft tissue planes are nonsuspicious.  IMPRESSION: Stable to worsening cardiomegaly. Pulmonary vascular congestion and interstitial prominence consistent with pulmonary edema without focal consolidation.   Electronically Signed   By: Elon Alas M.D.   On: 05/09/2015 04:05   Dg Elbow Complete Left  05/19/2015   CLINICAL DATA:  Found unresponsive on the floor, decreased range of motion in the left arm  EXAM: LEFT ELBOW -  COMPLETE 3+ VIEW  COMPARISON:  None.   FINDINGS: There is no evidence of fracture, dislocation, or joint effusion. There is no evidence of arthropathy or other focal bone abnormality. Soft tissues are unremarkable.  IMPRESSION: No acute abnormality noted.   Electronically Signed   By: Inez Catalina M.D.   On: 05/19/2015 09:15   Ct Head Wo Contrast  05/19/2015   CLINICAL DATA:  Found unresponsive on floor colloid initial encounter  EXAM: CT HEAD WITHOUT CONTRAST  CT MAXILLOFACIAL WITHOUT CONTRAST  CT CERVICAL SPINE WITHOUT CONTRAST  TECHNIQUE: Multidetector CT imaging of the head, cervical spine, and maxillofacial structures were performed using the standard protocol without intravenous contrast. Multiplanar CT image reconstructions of the cervical spine and maxillofacial structures were also generated.  COMPARISON:  None.  FINDINGS: CT HEAD FINDINGS  Bony calvarium is intact. A small forehead scalp hematoma is noted. Diffuse atrophic changes are seen. Findings of chronic white matter ischemic change are noted as well. No findings to suggest acute hemorrhage, acute infarction or space-occupying mass lesion are noted.  CT MAXILLOFACIAL FINDINGS  Forehead hematoma is again identified. No acute bony abnormality is seen. The paranasal sinuses mastoid air cells are within normal limits. The orbits and their contents are unremarkable. Multiple dental caries are seen. The salivary glands are within normal limits. No significant lymphadenopathy is noted.  CT CERVICAL SPINE FINDINGS  Seven cervical segments are well visualized. Vertebral body height is well maintained. Disc osteophytic changes are noted at all levels throughout the cervical spine. Facet hypertrophic changes are noted as well. No acute fracture or acute facet abnormality is identified. Diffuse carotid calcifications are seen. No significant lymphadenopathy is noted. The lung apices show changes of diffuse interstitial disease stable from previous chest x-rays.  IMPRESSION: CT of the head: Chronic  atrophic and ischemic changes.  Scalp hematoma consistent with the recent injury.  CT of the maxillofacial bones:  No acute bony abnormality noted.  CT of the cervical spine: Multilevel degenerative change without acute abnormality.   Electronically Signed   By: Inez Catalina M.D.   On: 05/19/2015 07:36   Ct Cervical Spine Wo Contrast  05/19/2015   CLINICAL DATA:  Found unresponsive on floor colloid initial encounter  EXAM: CT HEAD WITHOUT CONTRAST  CT MAXILLOFACIAL WITHOUT CONTRAST  CT CERVICAL SPINE WITHOUT CONTRAST  TECHNIQUE: Multidetector CT imaging of the head, cervical spine, and maxillofacial structures were performed using the standard protocol without intravenous contrast. Multiplanar CT image reconstructions of the cervical spine and maxillofacial structures were also generated.  COMPARISON:  None.  FINDINGS: CT HEAD FINDINGS  Bony calvarium is intact. A small forehead scalp hematoma is noted. Diffuse atrophic changes are seen. Findings of chronic white matter ischemic change are noted as well. No findings to suggest acute hemorrhage, acute infarction or space-occupying mass lesion are noted.  CT MAXILLOFACIAL FINDINGS  Forehead hematoma is again identified. No acute bony abnormality is seen. The paranasal sinuses mastoid air cells are within normal limits. The orbits and their contents are unremarkable. Multiple dental caries are seen. The salivary glands are within normal limits. No significant lymphadenopathy is noted.  CT CERVICAL SPINE FINDINGS  Seven cervical segments are well visualized. Vertebral body height is well maintained. Disc osteophytic changes are noted at all levels throughout the cervical spine. Facet hypertrophic changes are noted as well. No acute fracture or acute facet abnormality is identified. Diffuse carotid calcifications are seen. No significant lymphadenopathy is noted. The lung apices show changes of diffuse interstitial  disease stable from previous chest x-rays.   IMPRESSION: CT of the head: Chronic atrophic and ischemic changes.  Scalp hematoma consistent with the recent injury.  CT of the maxillofacial bones:  No acute bony abnormality noted.  CT of the cervical spine: Multilevel degenerative change without acute abnormality.   Electronically Signed   By: Inez Catalina M.D.   On: 05/19/2015 07:36   Dg Shoulder Left  05/19/2015   CLINICAL DATA:  Found unresponsive on the floor, decreased range of motion in the left shoulder, initial encounter  EXAM: LEFT SHOULDER - 2+ VIEW  COMPARISON:  None.  FINDINGS: There are changes consistent with an anterior inferior dislocation of the humeral head with respect to the glenoid labrum. Old left rib fractures are seen.  IMPRESSION: Anterior inferior dislocation of the left humeral head.   Electronically Signed   By: Inez Catalina M.D.   On: 05/19/2015 09:08   Dg Shoulder Left Port  05/19/2015   CLINICAL DATA:  Left shoulder pain. Dislocation status postreduction. Initial encounter.  EXAM: LEFT SHOULDER - 1 VIEW  COMPARISON:  05/19/2015 at 0837 hr  FINDINGS: Glenohumeral dislocation on the prior study has been reduced. No acute fracture is identified. Degenerative changes are noted at the acromioclavicular joint.  IMPRESSION: Interval reduction of glenohumeral dislocation.   Electronically Signed   By: Logan Bores   On: 05/19/2015 11:42   Ct Maxillofacial Wo Cm  05/19/2015   CLINICAL DATA:  Found unresponsive on floor colloid initial encounter  EXAM: CT HEAD WITHOUT CONTRAST  CT MAXILLOFACIAL WITHOUT CONTRAST  CT CERVICAL SPINE WITHOUT CONTRAST  TECHNIQUE: Multidetector CT imaging of the head, cervical spine, and maxillofacial structures were performed using the standard protocol without intravenous contrast. Multiplanar CT image reconstructions of the cervical spine and maxillofacial structures were also generated.  COMPARISON:  None.  FINDINGS: CT HEAD FINDINGS  Bony calvarium is intact. A small forehead scalp hematoma is noted.  Diffuse atrophic changes are seen. Findings of chronic white matter ischemic change are noted as well. No findings to suggest acute hemorrhage, acute infarction or space-occupying mass lesion are noted.  CT MAXILLOFACIAL FINDINGS  Forehead hematoma is again identified. No acute bony abnormality is seen. The paranasal sinuses mastoid air cells are within normal limits. The orbits and their contents are unremarkable. Multiple dental caries are seen. The salivary glands are within normal limits. No significant lymphadenopathy is noted.  CT CERVICAL SPINE FINDINGS  Seven cervical segments are well visualized. Vertebral body height is well maintained. Disc osteophytic changes are noted at all levels throughout the cervical spine. Facet hypertrophic changes are noted as well. No acute fracture or acute facet abnormality is identified. Diffuse carotid calcifications are seen. No significant lymphadenopathy is noted. The lung apices show changes of diffuse interstitial disease stable from previous chest x-rays.  IMPRESSION: CT of the head: Chronic atrophic and ischemic changes.  Scalp hematoma consistent with the recent injury.  CT of the maxillofacial bones:  No acute bony abnormality noted.  CT of the cervical spine: Multilevel degenerative change without acute abnormality.   Electronically Signed   By: Inez Catalina M.D.   On: 05/19/2015 07:36     EKG: (Independently reviewed) atrial fibrillation with nonspecific ST changes lateral leads unchanged from previous EKG   Assessment & Plan  Principal Problem:   Diabetes mellitus with Hypoglycemia secondary to sulfonylurea -Admit to telemetry -Hold Amaryl -Check CBGs every 4 hours and one CBGs remain elevated can utilize sensitive sliding scale insulin if  needed -Hemoglobin A1c was 5.9 05/12/2015 and in an elderly patient with dementia and a degree of failure to thrive this likely represents over corrected diabetes and suspect this patient no longer needs oral  agents -Anticipate a 48-72 hour washout period for Amaryl so will utilize dextrose containing IV fluids at 30 mL per hour for now -Possibility underlying infectious etiology to hypoglycemia so check CBC and urinalysis with culture  Active Problems:   Syncope and collapse -During previous admission postulated related to arrhythmia so we'll continue telemetry as above -In addition consider symptomatically hypoglycemic as etiology as well -We'll need PT/OT evaluation again prior to discharge -Okay to ambulate with assistance -On Lasix prior to admission so check orthostatic vital signs and hold Lasix until confirmed not orthostatic    Anterior dislocation of left shoulder -Has been reduced in the ER -Continue Tylenol or oxycodone for pain    HTN (hypertension) -Current blood pressure moderately controlled -Continue preadmission medications    ATRIAL FIBRILLATION, CHRONIC -Currently rate controlled -Patient not aware of any palpitations -INR not therapeutic at presentation; Lasix pharmacy to manage warfarin - CHADVASc = 7 -If continues to have issues with falls may need to revisit appropriateness of anticoagulation    Grade 3 diastolic heart failure/chronic -Appears compensated at this juncture -Concern with syncopal event may be somewhat volume depleted so we'll hold Lasix for now    Severe pulmonary hypertension (76 mmHg) with moderate tricuspid regurgitation -Likely explanation for patient's ambulatory hypoxemia -Use caution with over diuresis    DVT Prophylaxis: Warfarin  Family Communication: No family at bedside    Code Status:  Full code  Condition:  Stable  Discharge disposition: Pending repeat PT evaluation and family ability to continue caring for patient suspect patient will discharge back to home with daughter wants CBGs recover from hypoglycemic range  Time spent in minutes : 60      ELLIS,ALLISON L. ANP on 05/19/2015 at 12:31 PM  Between 7am to 7pm -  Pager - [phone]  After 7pm go to [email or web address] - password [email or web address]  And look for the night coverage person covering me after hours  [facility]

## 2015-05-19 NOTE — Progress Notes (Signed)
Went to get patient for CT scan, RN stated patient needed new CBG done before pt could come for scans.

## 2015-05-19 NOTE — ED Notes (Signed)
Patient transported to CT 

## 2015-05-19 NOTE — ED Notes (Signed)
Ok per EDP AN to place PIV in right arm with hx of mastectomy due to urgent need and possible injury to left arm

## 2015-05-19 NOTE — Progress Notes (Signed)
Utilization Review completed. Leonell Lobdell RN BSN CM 

## 2015-05-19 NOTE — Progress Notes (Signed)
Orthopedic Tech Progress Note Patient Details:  [name] 11-20-1926 [id number]  Ortho Devices Type of Ortho Device: Shoulder immobilizer Ortho Device/Splint Location: LUE Ortho Device/Splint Interventions: Ordered, Application   Braulio Bosch 05/19/2015, 8:19 PM

## 2015-05-19 NOTE — ED Notes (Signed)
Per EMS: pt found by family at home face down on floor; pt noted have CBG of 36 and was given oral glucose and c/o left arm pain from fall, CMS intact, pt takes metformin and glipizide; unsure when pt fell last seen at 1100

## 2015-05-19 NOTE — Progress Notes (Signed)
ANTICOAGULATION CONSULT NOTE - Initial Consult  Pharmacy Consult for Coumadin Indication: atrial fibrillation  Allergies  Allergen Reactions  . Peach [Prunus Persica] Rash    AGENT: peach fuzz    Patient Measurements:   Heparin Dosing Weight:    Vital Signs: BP: 153/65 mmHg (06/11 1230) Pulse Rate: 62 (06/11 1230)  Labs:  Recent Labs  05/19/15 0645 05/19/15 0648  HGB  --  13.9  HCT  --  41.0  LABPROT 19.4*  --   INR 1.64*  --   CREATININE 0.97 0.90  CKTOTAL 132  --     Estimated Creatinine Clearance: 51.4 mL/min (by C-G formula based on Cr of 0.9).   Medical History: Past Medical History  Diagnosis Date  . 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 DVT (deep venous thrombosis)   . Arthritis   . Hyperlipidemia   . Hypertension   . CHF (congestive heart failure)   . Anemia   . Cholelithiasis   . GI bleed   . Breast cancer 2012    right  . Peripheral neuropathy   . Heart murmur   . Syncope 05/09/2015  . Shortness of breath dyspnea   . Diabetes mellitus   . GERD (gastroesophageal reflux disease)     Medications: see med rec   Assessment: 81 y/o F found face down on the floor by her family with CBG 36, 17, and 25 in various reports. Patient has a recent hospital visit due to repeated syncopal events due to cardiac arrhythmias. Patient is on chronic Coumadin for afib.CT=Scalp hematoma. She also has noticeable bruising to the front of her right forehead and brow area. CK 132 WNL.   Anticoagulation: h/o DVT (when?), afib. (PTA 2mg  daily, except 4mg  on Th,Sat, admit INR 1.64)). Hgb 13.9 ok.   Cardiovascular: HTN, HLD, CHF, syncope  Endocrinology: DM (A1c 5.9)  Gastrointestinal / Nutrition: h/o GIB, GERD  Neurology: peripheral neuropathy  Nephrology: Scr 0.9  Pulmonary  Hematology / Oncology: breast DCIS s/p mastectomy, h/o anemia  PTA Medication Issues  Best Practices   Goal of Therapy:   INR 2-3 Monitor platelets by anticoagulation protocol: Yes   Plan:  Coumadin 5mg  po x 1 tonight Daily INR F/u for worsening of hematoma and bruising   Maricarmen Braziel S. Alford Highland, PharmD, BCPS Clinical Staff Pharmacist Pager [phone]  Eilene Ghazi Stillinger 05/19/2015,12:43 PM

## 2015-05-19 NOTE — Progress Notes (Signed)
Pt admitted to the unit at 1405. Pt mental status is A&OX4. Pt oriented to room, staff, and call bell. Skin is intact, but bruise to L thigh noted. Full assessment charted in [facility]. Call bell within reach. Visitor guidelines reviewed w/ pt and/or family.

## 2015-05-19 NOTE — Progress Notes (Signed)
Attempted to get orthostatic vital signs on patient and she requested we do them in the morning and she did not feel like having to sit or stand for the vital signs this evening. Will address in AM.

## 2015-05-20 DIAGNOSIS — R55 Syncope and collapse: Secondary | ICD-10-CM

## 2015-05-20 DIAGNOSIS — I481 Persistent atrial fibrillation: Secondary | ICD-10-CM

## 2015-05-20 DIAGNOSIS — I5032 Chronic diastolic (congestive) heart failure: Secondary | ICD-10-CM

## 2015-05-20 DIAGNOSIS — E875 Hyperkalemia: Secondary | ICD-10-CM

## 2015-05-20 DIAGNOSIS — S43015D Anterior dislocation of left humerus, subsequent encounter: Secondary | ICD-10-CM

## 2015-05-20 DIAGNOSIS — I270 Primary pulmonary hypertension: Secondary | ICD-10-CM

## 2015-05-20 DIAGNOSIS — E876 Hypokalemia: Secondary | ICD-10-CM

## 2015-05-20 DIAGNOSIS — T383X3A Poisoning by insulin and oral hypoglycemic [antidiabetic] drugs, assault, initial encounter: Secondary | ICD-10-CM

## 2015-05-20 LAB — GLUCOSE, CAPILLARY
GLUCOSE-CAPILLARY: 146 mg/dL — AB (ref 65–99)
GLUCOSE-CAPILLARY: 207 mg/dL — AB (ref 65–99)
Glucose-Capillary: 112 mg/dL — ABNORMAL HIGH (ref 65–99)
Glucose-Capillary: 132 mg/dL — ABNORMAL HIGH (ref 65–99)
Glucose-Capillary: 133 mg/dL — ABNORMAL HIGH (ref 65–99)
Glucose-Capillary: 218 mg/dL — ABNORMAL HIGH (ref 65–99)
Glucose-Capillary: 339 mg/dL — ABNORMAL HIGH (ref 65–99)

## 2015-05-20 LAB — URINALYSIS, ROUTINE W REFLEX MICROSCOPIC
Bilirubin Urine: NEGATIVE
Glucose, UA: >1000 mg/dL — AB
HGB URINE DIPSTICK: NEGATIVE
KETONES UR: NEGATIVE mg/dL
LEUKOCYTES UA: NEGATIVE
Nitrite: NEGATIVE
Protein, ur: NEGATIVE mg/dL
Specific Gravity, Urine: 1.021 (ref 1.005–1.030)
UROBILINOGEN UA: 0.2 mg/dL (ref 0.0–1.0)
pH: 5 (ref 5.0–8.0)

## 2015-05-20 LAB — NA AND K (SODIUM & POTASSIUM), RAND UR
POTASSIUM UR: 16 mmol/L
SODIUM UR: 111 mmol/L

## 2015-05-20 LAB — URINE MICROSCOPIC-ADD ON

## 2015-05-20 LAB — CBC
HCT: 32.7 % — ABNORMAL LOW (ref 36.0–46.0)
HEMOGLOBIN: 10 g/dL — AB (ref 12.0–15.0)
MCH: 29.5 pg (ref 26.0–34.0)
MCHC: 30.6 g/dL (ref 30.0–36.0)
MCV: 96.5 fL (ref 78.0–100.0)
Platelets: 146 10*3/uL — ABNORMAL LOW (ref 150–400)
RBC: 3.39 MIL/uL — ABNORMAL LOW (ref 3.87–5.11)
RDW: 16.9 % — ABNORMAL HIGH (ref 11.5–15.5)
WBC: 5.9 10*3/uL (ref 4.0–10.5)

## 2015-05-20 LAB — DIGOXIN LEVEL: DIGOXIN LVL: 2.3 ng/mL — AB (ref 0.8–2.0)

## 2015-05-20 LAB — BASIC METABOLIC PANEL
ANION GAP: 9 (ref 5–15)
BUN: 8 mg/dL (ref 6–20)
CO2: 30 mmol/L (ref 22–32)
CREATININE: 0.82 mg/dL (ref 0.44–1.00)
Calcium: 8.2 mg/dL — ABNORMAL LOW (ref 8.9–10.3)
Chloride: 97 mmol/L — ABNORMAL LOW (ref 101–111)
GFR calc Af Amer: >60 mL/min (ref >60)
Glucose, Bld: 154 mg/dL — ABNORMAL HIGH (ref 65–99)
POTASSIUM: 5.4 mmol/L — AB (ref 3.5–5.1)
Sodium: 136 mmol/L (ref 135–145)

## 2015-05-20 LAB — PROTIME-INR
INR: 1.85 — AB (ref 0.00–1.49)
PROTHROMBIN TIME: 21.3 s — AB (ref 11.6–15.2)

## 2015-05-20 MED ORDER — WARFARIN SODIUM 4 MG PO TABS
4.0000 mg | ORAL_TABLET | Freq: Once | ORAL | Status: AC
Start: 2015-05-20 — End: 2015-05-20
  Administered 2015-05-20: 4 mg via ORAL
  Filled 2015-05-20: qty 1

## 2015-05-20 MED ORDER — METOPROLOL TARTRATE 12.5 MG HALF TABLET
12.5000 mg | ORAL_TABLET | Freq: Two times a day (BID) | ORAL | Status: DC
  Administered 2015-05-20 – 2015-05-21 (×3): 12.5 mg via ORAL
  Filled 2015-05-20 (×4): qty 1

## 2015-05-20 NOTE — Progress Notes (Signed)
Subjective:  Patient denies any chest pain or shortness of breath either to go home. Discussed with patient regarding skilled nursing facility but wants to go home. Patient admitted with marked hyperglycemia and fall sustaining left shoulder dislocation which was reduced in ED. Patient had occasional episode of A. fib with RVR on the monitor no significant pauses  Objective:  Vital Signs in the last 24 hours: Temp:  [99.1 F (37.3 C)-100.6 F (38.1 C)] 99.1 F (37.3 C) (06/12 0610) Pulse Rate:  [62-91] 75 (06/12 0903) Resp:  [18-27] 18 (06/12 0903) BP: (108-157)/(54-69) 135/54 mmHg (06/12 0903) SpO2:  [94 %-100 %] 94 % (06/12 0903) Weight:  [99.655 kg (219 lb 11.2 oz)-101.334 kg (223 lb 6.4 oz)] 99.655 kg (219 lb 11.2 oz) (06/12 0447)  Intake/Output from previous day: 06/11 0701 - 06/12 0700 In: 475.5 [I.V.:475.5] Out: 2300 [Urine:2300] Intake/Output from this shift: Total I/O In: 200 [P.O.:200] Out: 400 [Urine:400]  Physical Exam: Neck: no adenopathy, no carotid bruit, no JVD and supple, symmetrical, trachea midline Lungs: clear to auscultation bilaterally Heart: irregularly irregular rhythm, S1, S2 normal and Soft systolic murmur noted Abdomen: soft, non-tender; bowel sounds normal; no masses,  no organomegaly Extremities: extremities normal, atraumatic, no cyanosis or edema and Left shoulder sling noted  Lab Results:  Recent Labs  05/19/15 1259 05/20/15 0532  WBC 6.8 5.9  HGB 11.9* 10.0*  PLT 147* 146*    Recent Labs  05/19/15 0645 05/19/15 0648 05/20/15 0532  NA 136 139 136  K 5.3* 5.1 5.4*  CL 100* 99* 97*  CO2 24  --  30  GLUCOSE 32* 37* 154*  BUN 13 19 8   CREATININE 0.97 0.90 0.82   No results for input(s): TROPONINI in the last 72 hours.  Invalid input(s): CK, MB Hepatic Function Panel No results for input(s): PROT, ALBUMIN, AST, ALT, ALKPHOS, BILITOT, BILIDIR, IBILI in the last 72 hours. No results for input(s): CHOL in the last 72 hours. No  results for input(s): PROTIME in the last 72 hours.  Imaging: Imaging results have been reviewed and Dg Elbow Complete Left  05/19/2015   CLINICAL DATA:  Found unresponsive on the floor, decreased range of motion in the left arm  EXAM: LEFT ELBOW - COMPLETE 3+ VIEW  COMPARISON:  None.  FINDINGS: There is no evidence of fracture, dislocation, or joint effusion. There is no evidence of arthropathy or other focal bone abnormality. Soft tissues are unremarkable.  IMPRESSION: No acute abnormality noted.   Electronically Signed   By: Inez Catalina M.D.   On: 05/19/2015 09:15   Ct Head Wo Contrast  05/19/2015   CLINICAL DATA:  Found unresponsive on floor colloid initial encounter  EXAM: CT HEAD WITHOUT CONTRAST  CT MAXILLOFACIAL WITHOUT CONTRAST  CT CERVICAL SPINE WITHOUT CONTRAST  TECHNIQUE: Multidetector CT imaging of the head, cervical spine, and maxillofacial structures were performed using the standard protocol without intravenous contrast. Multiplanar CT image reconstructions of the cervical spine and maxillofacial structures were also generated.  COMPARISON:  None.  FINDINGS: CT HEAD FINDINGS  Bony calvarium is intact. A small forehead scalp hematoma is noted. Diffuse atrophic changes are seen. Findings of chronic white matter ischemic change are noted as well. No findings to suggest acute hemorrhage, acute infarction or space-occupying mass lesion are noted.  CT MAXILLOFACIAL FINDINGS  Forehead hematoma is again identified. No acute bony abnormality is seen. The paranasal sinuses mastoid air cells are within normal limits. The orbits and their contents are unremarkable. Multiple dental caries  are seen. The salivary glands are within normal limits. No significant lymphadenopathy is noted.  CT CERVICAL SPINE FINDINGS  Seven cervical segments are well visualized. Vertebral body height is well maintained. Disc osteophytic changes are noted at all levels throughout the cervical spine. Facet hypertrophic changes  are noted as well. No acute fracture or acute facet abnormality is identified. Diffuse carotid calcifications are seen. No significant lymphadenopathy is noted. The lung apices show changes of diffuse interstitial disease stable from previous chest x-rays.  IMPRESSION: CT of the head: Chronic atrophic and ischemic changes.  Scalp hematoma consistent with the recent injury.  CT of the maxillofacial bones:  No acute bony abnormality noted.  CT of the cervical spine: Multilevel degenerative change without acute abnormality.   Electronically Signed   By: Inez Catalina M.D.   On: 05/19/2015 07:36   Ct Cervical Spine Wo Contrast  05/19/2015   CLINICAL DATA:  Found unresponsive on floor colloid initial encounter  EXAM: CT HEAD WITHOUT CONTRAST  CT MAXILLOFACIAL WITHOUT CONTRAST  CT CERVICAL SPINE WITHOUT CONTRAST  TECHNIQUE: Multidetector CT imaging of the head, cervical spine, and maxillofacial structures were performed using the standard protocol without intravenous contrast. Multiplanar CT image reconstructions of the cervical spine and maxillofacial structures were also generated.  COMPARISON:  None.  FINDINGS: CT HEAD FINDINGS  Bony calvarium is intact. A small forehead scalp hematoma is noted. Diffuse atrophic changes are seen. Findings of chronic white matter ischemic change are noted as well. No findings to suggest acute hemorrhage, acute infarction or space-occupying mass lesion are noted.  CT MAXILLOFACIAL FINDINGS  Forehead hematoma is again identified. No acute bony abnormality is seen. The paranasal sinuses mastoid air cells are within normal limits. The orbits and their contents are unremarkable. Multiple dental caries are seen. The salivary glands are within normal limits. No significant lymphadenopathy is noted.  CT CERVICAL SPINE FINDINGS  Seven cervical segments are well visualized. Vertebral body height is well maintained. Disc osteophytic changes are noted at all levels throughout the cervical spine.  Facet hypertrophic changes are noted as well. No acute fracture or acute facet abnormality is identified. Diffuse carotid calcifications are seen. No significant lymphadenopathy is noted. The lung apices show changes of diffuse interstitial disease stable from previous chest x-rays.  IMPRESSION: CT of the head: Chronic atrophic and ischemic changes.  Scalp hematoma consistent with the recent injury.  CT of the maxillofacial bones:  No acute bony abnormality noted.  CT of the cervical spine: Multilevel degenerative change without acute abnormality.   Electronically Signed   By: Inez Catalina M.D.   On: 05/19/2015 07:36   Dg Shoulder Left  05/19/2015   CLINICAL DATA:  Found unresponsive on the floor, decreased range of motion in the left shoulder, initial encounter  EXAM: LEFT SHOULDER - 2+ VIEW  COMPARISON:  None.  FINDINGS: There are changes consistent with an anterior inferior dislocation of the humeral head with respect to the glenoid labrum. Old left rib fractures are seen.  IMPRESSION: Anterior inferior dislocation of the left humeral head.   Electronically Signed   By: Inez Catalina M.D.   On: 05/19/2015 09:08   Dg Shoulder Left Port  05/19/2015   CLINICAL DATA:  Left shoulder pain. Dislocation status postreduction. Initial encounter.  EXAM: LEFT SHOULDER - 1 VIEW  COMPARISON:  05/19/2015 at 0837 hr  FINDINGS: Glenohumeral dislocation on the prior study has been reduced. No acute fracture is identified. Degenerative changes are noted at the acromioclavicular joint.  IMPRESSION: Interval reduction of glenohumeral dislocation.   Electronically Signed   By: Logan Bores   On: 05/19/2015 11:42   Ct Maxillofacial Wo Cm  05/19/2015   CLINICAL DATA:  Found unresponsive on floor colloid initial encounter  EXAM: CT HEAD WITHOUT CONTRAST  CT MAXILLOFACIAL WITHOUT CONTRAST  CT CERVICAL SPINE WITHOUT CONTRAST  TECHNIQUE: Multidetector CT imaging of the head, cervical spine, and maxillofacial structures were  performed using the standard protocol without intravenous contrast. Multiplanar CT image reconstructions of the cervical spine and maxillofacial structures were also generated.  COMPARISON:  None.  FINDINGS: CT HEAD FINDINGS  Bony calvarium is intact. A small forehead scalp hematoma is noted. Diffuse atrophic changes are seen. Findings of chronic white matter ischemic change are noted as well. No findings to suggest acute hemorrhage, acute infarction or space-occupying mass lesion are noted.  CT MAXILLOFACIAL FINDINGS  Forehead hematoma is again identified. No acute bony abnormality is seen. The paranasal sinuses mastoid air cells are within normal limits. The orbits and their contents are unremarkable. Multiple dental caries are seen. The salivary glands are within normal limits. No significant lymphadenopathy is noted.  CT CERVICAL SPINE FINDINGS  Seven cervical segments are well visualized. Vertebral body height is well maintained. Disc osteophytic changes are noted at all levels throughout the cervical spine. Facet hypertrophic changes are noted as well. No acute fracture or acute facet abnormality is identified. Diffuse carotid calcifications are seen. No significant lymphadenopathy is noted. The lung apices show changes of diffuse interstitial disease stable from previous chest x-rays.  IMPRESSION: CT of the head: Chronic atrophic and ischemic changes.  Scalp hematoma consistent with the recent injury.  CT of the maxillofacial bones:  No acute bony abnormality noted.  CT of the cervical spine: Multilevel degenerative change without acute abnormality.   Electronically Signed   By: Inez Catalina M.D.   On: 05/19/2015 07:36    Cardiac Studies:  Assessment/Plan:  Status post fall/dislocation left shoulder Status post hypoglycemia Chronic atrial fibrillation Compensated congestive heart failure secondary to preserved LV systolic function Status post recent Probable very small non-Q-wave MI with atypical  presentation Status post left cath mild CAD Hypertension Diabetes mellitus Hypercholesteremia History of DVT in the past History of CA of breast Degenerative joint disease Status post left cataract surgery History of upper GI bleed in the past Plan Agree with DC Amaryl. Will change Coreg to low-dose metoprolol as per orders Will monitor next 24-hours if  Stable will discharge tomorrow  LOS: 1 day    [name] 05/20/2015, 12:09 PM

## 2015-05-20 NOTE — Progress Notes (Signed)
[name] BVQ:[id number] DOB: [birth year] DOA: 05/19/2015 PCP: Maximino Greenland, MD  Brief narrative:  81 y/o ? htn, DM ty 2 on Orals, Chr [id number] on Coumadin, HfPEF, H/o DVT/CVA, Breast Canc s/p lumpectomy [ER+]-s/p XRT r breat 10/2010 foll Dr. Becky Sax Aromasin, HLD, DJD,  Massive GI bleed 08/2012 with eventual diagnosis of a mass on EGD= ?GI stromal tumour followed by Dr. Mamie Laurel Recent admission Dr. Betti Cruz exertional dyspnea/EKG -Cath that time non-obst CAD, EF55-65%, MOd MV regrug--noted some pauses ~ 2 second that admit  Readmit 6/11 multifactorial syncope-[anemia/hypoglycemia-profound/Sick sinus syndrome]  obj CBG 17 per EMS: INR 1.6: L shoulder dislocation-reduced in ED:   Past medical history-As per Problem list Chart reviewed as below-   Consultants:  Cardiology  Procedures:  none  Antibiotics:  none   Subjective  Fair no n/v/cp No sob No fever nor chills Wants to see Dr. Terrence Dupont   Objective    Interim History:   Telemetry: pauses   Objective: Filed Vitals:   05/20/15 0610 05/20/15 0846 05/20/15 0901 05/20/15 0903  BP: 108/69 131/55 151/66 135/54  Pulse: 77 77 79 75  Temp: 99.1 F (37.3 C)     TempSrc: Oral     Resp: 18  18 18   Height:      Weight:      SpO2: 99%  96% 94%    Intake/Output Summary (Last 24 hours) at 05/20/15 1000 Last data filed at 05/20/15 0917  Gross per 24 hour  Intake  675.5 ml  Output   2700 ml  Net -2024.5 ml    Exam:  General: eomi, ncat Cardiovascular: s1 s2 irreg irreg, HSM M 3/6, no breast assymmetry Respiratory: clear no added sound Abdomen: no abd pain Skin no le edema Neuro intact-smile symm, moves all 4 limbs equally, refelxes are wnl  Data Reviewed: Basic Metabolic Panel:  Recent Labs Lab 05/19/15 0645 05/19/15 0648 05/20/15 0532  NA 136 139 136  K 5.3* 5.1 5.4*  CL 100* 99* 97*  CO2 24  --  30  GLUCOSE 32* 37* 154*  BUN 13 19 8   CREATININE 0.97 0.90 0.82    CALCIUM 8.3*  --  8.2*   Liver Function Tests: No results for input(s): AST, ALT, ALKPHOS, BILITOT, PROT, ALBUMIN in the last 168 hours. No results for input(s): LIPASE, AMYLASE in the last 168 hours. No results for input(s): AMMONIA in the last 168 hours. CBC:  Recent Labs Lab 05/19/15 0648 05/19/15 1259 05/20/15 0532  WBC  --  6.8 5.9  HGB 13.9 11.9* 10.0*  HCT 41.0 38.6 32.7*  MCV  --  96.0 96.5  PLT  --  147* 146*   Cardiac Enzymes:  Recent Labs Lab 05/19/15 0645  CKTOTAL 132   BNP: Invalid input(s): POCBNP CBG:  Recent Labs Lab 05/19/15 1625 05/19/15 2045 05/20/15 0003 05/20/15 0440 05/20/15 0753  GLUCAP 227* 278* 207* 133* 132*    No results found for this or any previous visit (from the past 240 hour(s)).   Studies:              All Imaging reviewed and is as per above notation   Scheduled Meds: . atorvastatin  20 mg Oral Daily  . carvedilol  3.125 mg Oral BID WC  . digoxin  125 mcg Oral Daily  . furosemide  40 mg Oral Daily  . gabapentin  300 mg Oral BH-q7a  . insulin aspart  0-9 Units Subcutaneous 6 times per day  . ketorolac  1 drop Left Eye Daily  . ofloxacin  1 drop Left Eye Daily  . pantoprazole  40 mg Oral BID AC  . prednisoLONE acetate  1 drop Left Eye Daily  . Warfarin - Pharmacist Dosing Inpatient   Does not apply q1800   Continuous Infusions: . dextrose 5 % and 0.9% NaCl 30 mL/hr at 05/19/15 1405     Assessment/Plan: Syncope-multifactorial-d/c amaryl forever.  Pauses not over 3 sec ? no currecnt PPM indication.  D/w Dr. Terrence Dupont as patient would like him to take over her care.   Hyperkalemia-unclear etiology-continue lasix lower dose 20 daily as diuretics have assosc c falls in elderly-obtain U K/Sod ratio and Osm if persisting beyond today Afib Chad2 7-continue Digoxin 125 Ucg daily.  Cget Dig level as albumin bound.  Poor BB candidate-on Coumadin-IR 1.8-defer Balancing Hasbled vs Chad2 to Dr. Mallory Shirk MR-preload  dependant-careful with volume status [diuresis etc] as can affect her and cause falls Adult FTT-get PT/Ot evals-might need SNF DM ty 2 on orals-d/c amaryl-careful metformin use based on GFR as OP  Code Status: FUll Family Communication: she doesn't want me to call her daughter Disposition Plan: inpatient-Dr. Terrence Dupont to see in am and assume care   Verneita Griffes, MD  [facility] Pager [phone] 05/20/2015, 10:00 AM    LOS: 1 day

## 2015-05-20 NOTE — Progress Notes (Signed)
ANTICOAGULATION CONSULT NOTE - F/U Consult  Pharmacy Consult for Coumadin Indication: atrial fibrillation  Allergies  Allergen Reactions  . Peach [Prunus Persica] Rash    AGENT: peach fuzz    Patient Measurements: Height: 5\' 4"  (162.6 cm) Weight: 219 lb 11.2 oz (99.655 kg) IBW/kg (Calculated) : 54.7 Heparin Dosing Weight:    Vital Signs: Temp: 99.1 F (37.3 C) (06/12 0610) Temp Source: Oral (06/12 0610) BP: 135/54 mmHg (06/12 0903) Pulse Rate: 75 (06/12 0903)  Labs:  Recent Labs  05/19/15 0645  05/19/15 0648 05/19/15 1259 05/20/15 0532  HGB  --   < > 13.9 11.9* 10.0*  HCT  --   --  41.0 38.6 32.7*  PLT  --   --   --  147* 146*  LABPROT 19.4*  --   --   --  21.3*  INR 1.64*  --   --   --  1.85*  CREATININE 0.97  --  0.90  --  0.82  CKTOTAL 132  --   --   --   --   < > = values in this interval not displayed.  Estimated Creatinine Clearance: 56.5 mL/min (by C-G formula based on Cr of 0.82).   Medical History: Past Medical History  Diagnosis Date  . 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 DVT (deep venous thrombosis)   . Arthritis   . Hyperlipidemia   . Hypertension   . CHF (congestive heart failure)   . Anemia   . Cholelithiasis   . GI bleed   . Breast cancer 2012    right  . Peripheral neuropathy   . Heart murmur   . Syncope 05/09/2015  . Shortness of breath dyspnea   . Diabetes mellitus   . GERD (gastroesophageal reflux disease)     Medications: see med rec   Assessment: 81 y/o F found face down on the floor by her family with CBG 36, 17, and 25 in various reports. Patient has a recent hospital visit due to repeated syncopal events due to cardiac arrhythmias. Patient is on chronic Coumadin for afib.CT=Scalp hematoma. She also has noticeable bruising to the front of her right forehead and brow area. CK 132 WNL.   PMH: HTN, DM, afrib, CHF (EF 55%), HLD, breast ca s/p radiation, hx of dvt  with tamoxifen therapy in 2011  Anticoagulation: h/o DVT, afib on warfarin. INR 1.85 today. H&H 10/32.7.  PTA 2mg  daily, except 4mg  on Th,Sat. Admit INR 1.64  Goal of Therapy:  INR 2-3 Monitor platelets by anticoagulation protocol: Yes   Plan:  -Coumadin 4 mg po x 1 tonight -Daily INR -monitor for s/sx of bleeding  Levester Fresh, PharmD, BCPS Clinical Pharmacist Pager [phone] 05/20/2015 10:41 AM

## 2015-05-20 NOTE — Discharge Instructions (Signed)

## 2015-05-21 LAB — COMPREHENSIVE METABOLIC PANEL
ALT: 13 U/L — AB (ref 14–54)
AST: 24 U/L (ref 15–41)
Albumin: 2.9 g/dL — ABNORMAL LOW (ref 3.5–5.0)
Alkaline Phosphatase: 79 U/L (ref 38–126)
Anion gap: 8 (ref 5–15)
BUN: 9 mg/dL (ref 6–20)
CALCIUM: 8.2 mg/dL — AB (ref 8.9–10.3)
CO2: 33 mmol/L — AB (ref 22–32)
CREATININE: 0.89 mg/dL (ref 0.44–1.00)
Chloride: 95 mmol/L — ABNORMAL LOW (ref 101–111)
GFR calc Af Amer: >60 mL/min (ref >60)
GFR, EST NON AFRICAN AMERICAN: 57 mL/min — AB (ref >60)
Glucose, Bld: 98 mg/dL (ref 65–99)
Potassium: 4.6 mmol/L (ref 3.5–5.1)
Sodium: 136 mmol/L (ref 135–145)
TOTAL PROTEIN: 6.5 g/dL (ref 6.5–8.1)
Total Bilirubin: 1 mg/dL (ref 0.3–1.2)

## 2015-05-21 LAB — CBC
HCT: 32.9 % — ABNORMAL LOW (ref 36.0–46.0)
Hemoglobin: 10.1 g/dL — ABNORMAL LOW (ref 12.0–15.0)
MCH: 29.1 pg (ref 26.0–34.0)
MCHC: 30.7 g/dL (ref 30.0–36.0)
MCV: 94.8 fL (ref 78.0–100.0)
Platelets: 149 10*3/uL — ABNORMAL LOW (ref 150–400)
RBC: 3.47 MIL/uL — AB (ref 3.87–5.11)
RDW: 16.6 % — ABNORMAL HIGH (ref 11.5–15.5)
WBC: 6.4 10*3/uL (ref 4.0–10.5)

## 2015-05-21 LAB — URINE CULTURE

## 2015-05-21 LAB — GLUCOSE, CAPILLARY
Glucose-Capillary: 17 mg/dL — CL (ref 65–99)
Glucose-Capillary: 128 mg/dL — ABNORMAL HIGH (ref 65–99)
Glucose-Capillary: 105 mg/dL — ABNORMAL HIGH (ref 65–99)
Glucose-Capillary: 111 mg/dL — ABNORMAL HIGH (ref 65–99)

## 2015-05-21 LAB — PROTIME-INR
INR: 2.05 — AB (ref 0.00–1.49)
PROTHROMBIN TIME: 23 s — AB (ref 11.6–15.2)

## 2015-05-21 LAB — OSMOLALITY, URINE: OSMOLALITY UR: 284 mosm/kg — AB (ref 390–1090)

## 2015-05-21 MED ORDER — METOPROLOL TARTRATE 25 MG PO TABS: 12.5000 mg | ORAL_TABLET | Freq: Two times a day (BID) | ORAL | Status: DC

## 2015-05-21 NOTE — Clinical Social Work Note (Signed)
CSW received notification from [facility] that patient is requiring EMS transport home. Patient's RN states that the patient does not need EMS transport at discharge. CSW signing off at this time.    Liz Beach MSW, [place], [place], [phone]

## 2015-05-21 NOTE — Progress Notes (Addendum)
Patient was discharged home by MD order; discharged instructions  review and give to patient and her granddaughter with care notes; IV DIC; skin intact; patient will be escorted to the car by nurse tech via wheelchair.

## 2015-05-21 NOTE — Progress Notes (Signed)
RN notified of patient having a 2.11 second pause. [facility] paged. Awaiting return page. Will continue to monitor.

## 2015-05-21 NOTE — Progress Notes (Signed)
Per Government social research officer, this RN needed to page cardiologist Harwani. Harwani returned paged. No new orders at this time. Patient is asymptomatic with no complaints of chest pain. Will continue to monitor.

## 2015-05-21 NOTE — Progress Notes (Signed)
Spoke with patient and her granddaughter (?) at bedside. Patient receives O2 from [facility], [place] liaison notified that patient will going home today, transport via [place], [place] notified via text and given Best boy at bedside's number to arrange time, [facility] PT RN order placed to resume care, Miranda at [facility] notified.

## 2015-05-21 NOTE — Discharge Summary (Signed)
Discharge summary dictated on 05/21/2015 dictation number is [phone]

## 2015-05-22 DIAGNOSIS — I2510 Atherosclerotic heart disease of native coronary artery without angina pectoris: Secondary | ICD-10-CM | POA: Diagnosis not present

## 2015-05-22 DIAGNOSIS — M1990 Unspecified osteoarthritis, unspecified site: Secondary | ICD-10-CM | POA: Diagnosis not present

## 2015-05-22 DIAGNOSIS — Z9981 Dependence on supplemental oxygen: Secondary | ICD-10-CM | POA: Diagnosis not present

## 2015-05-22 DIAGNOSIS — I5040 Unspecified combined systolic (congestive) and diastolic (congestive) heart failure: Secondary | ICD-10-CM | POA: Diagnosis not present

## 2015-05-22 DIAGNOSIS — I214 Non-ST elevation (NSTEMI) myocardial infarction: Secondary | ICD-10-CM | POA: Diagnosis not present

## 2015-05-22 DIAGNOSIS — E119 Type 2 diabetes mellitus without complications: Secondary | ICD-10-CM | POA: Diagnosis not present

## 2015-05-22 DIAGNOSIS — I482 Chronic atrial fibrillation: Secondary | ICD-10-CM | POA: Diagnosis not present

## 2015-05-22 DIAGNOSIS — I10 Essential (primary) hypertension: Secondary | ICD-10-CM | POA: Diagnosis not present

## 2015-05-22 DIAGNOSIS — E785 Hyperlipidemia, unspecified: Secondary | ICD-10-CM | POA: Diagnosis not present

## 2015-05-22 NOTE — Discharge Summary (Signed)
NAMEVERLEEN, STUCKEY NO.:  [id number]  MEDICAL RECORD NO.:  [id number]  LOCATION:  5W17C                        FACILITY:  [facility]  PHYSICIAN:  Allegra Lai. Terrence Dupont, M.D. DATE OF BIRTH:  November 23, 1926  DATE OF ADMISSION:  05/19/2015 DATE OF DISCHARGE:  05/21/2015                              DISCHARGE SUMMARY   ADMITTING DIAGNOSES: 1. Syncope/collapse likely from hypoglycemia. 2. Diabetes mellitus and chronic kidney disease, stage 3. 3. Anterior dislocation of left shoulder. 4. Hypertension. 5. Atrial fibrillation, chronic. 6. Chronic diastolic heart failure. 7. Hyperkalemia.  DISCHARGE DIAGNOSES: 1. Status post fall/dislocation left shoulder status post     hypoglycemia. 2. Chronic atrial fibrillation. 3. Status post digoxin toxicity. 4. Compensated congestive heart failure secondary to preserved LV     systolic function. 5. Status post recent probable very small non-Q-wave myocardial     infarction with atypical presentation, status post left cath, noted     to have mild coronary artery disease. 6. Hypertension. 7. Diabetes mellitus, controlled by diet. 8. Hypercholesteremia. 9. History of DVT in the past. 10.History of cancer of breast. 11.Degenerative joint disease. 12.Status post left cataract surgery, recently. 13.History of upper gastrointestinal bleed in the past.  MEDICATIONS:  Discharge home medications are; 1. Metoprolol tartrate 25 mg half tablet twice daily. 2. Atorvastatin 20 mg daily. 3. Lasix 20 mg daily. 4. Gabapentin 300 mg every morning. 5. Acular ophthalmic solution as before. 6. Ofloxacin ophthalmic solution as before. 7. Protonix 40 mg daily. 8. Prednisolone acetate 1% ophthalmic suspension as before. 9. Warfarin 1 tablet 5 days a week and 2 mg 1 tablet 5 days a week and     2 tablets on Thursday and Saturday.  The patient has been advised to stop taking carvedilol, digoxin, glimepiride, and potassium chloride.  DIET:  Low salt,  low cholesterol 1800 calories ADA diet.  Monitor blood sugar and blood pressure daily and chart.  CONDITION AT DISCHARGE:  Stable.  BRIEF HISTORY AND HOSPITAL COURSE:  [name] is an 81 year old female with past medical history significant for multiple medical problems, i.e., hypertension, diabetes mellitus, chronic atrial fibrillation, history of congestive heart failure secondary to preserved LV systolic function, history of DVT, history of CA of breast, history of degenerative joint disease, hypercholesteremia, history of syncope in the past, recently discharged from the hospital a few days ago, following probable very small non-Q-wave myocardial infarction requiring cardiac catheterization which showed mild coronary artery disease.  She came to the ED and was found by her family lying on the floor when EMS arrived.  Apparently, the patient's CBG was only 17 for which she was given oral glucose and D50.  There was no efferent respiratory distress. The patient came to consciousness.  She is not a good historian, but was lethargic of note.  In the ED, the patient was hemodynamically stable. Her CBG on admission was 32, 37, but improved with D50 to 120.  CT of the brain showed scalp hematoma, otherwise no abnormalities.  Left shoulder x-ray showed anterior-inferior dislocation of the left humeral head, and this was reduced in the ED.  The patient was admitted by triad hospitalist and was transferred to my service upon the patient's  request.  PHYSICAL EXAMINATION:  VITAL SIGNS:  Her blood pressure was 176/88, pulse 52 irregularly irregular. GENERAL:  No acute distress. NEUROLOGIC:  Grossly intact. HEENT:  Normal.  conjunctivae clear. NECK:  Supple.  No lymphadenopathy. LUNGS:  Clear to auscultation. CARDIOVASCULAR:  Irregularly irregular.  No gallop or murmur noted. ABDOMEN:  Soft. EXTREMITIES:  No clubbing, cyanosis, or edema.  LABS:  Hemoglobin was 11.9, hematocrit 38.6,  white count of 6.8.  X-ray of the shoulder showed anterior-inferior dislocation of the left femoral head.  Repeat x-ray showed interval reduction of glenohumeral dislocation.  Her blood sugar today is 111.  Sodium was 136, potassium 4.6, BUN 9, creatinine 0.89.  Her PT/INR today; PT is 23, INR 2.05.  BRIEF HOSPITAL COURSE:  The patient was admitted by triad hospitalist. Her Amaryl was discontinued and continued on home medications.  The patient had a brief episode of AFib with RVR on the monitor.  She was started on p.o. low-dose Lopressor and subsequently had a pause up to 2.1 second.  She was asymptomatic.  Her digoxin level was mildly elevated to 2.3, digoxin has been discontinue.  The patient otherwise remains in AFib with controlled ventricular response.  The patient is eager to go home and will be discharged home on above medications and will be followed up in my office in 1 week and PMD as scheduled.     Allegra Lai. Terrence Dupont, M.D.    M[facility]/MEDQ  D:  05/21/2015  T:  05/22/2015  Job:  [id number]

## 2015-05-23 DIAGNOSIS — M1990 Unspecified osteoarthritis, unspecified site: Secondary | ICD-10-CM | POA: Diagnosis not present

## 2015-05-23 DIAGNOSIS — E119 Type 2 diabetes mellitus without complications: Secondary | ICD-10-CM | POA: Diagnosis not present

## 2015-05-23 DIAGNOSIS — I5040 Unspecified combined systolic (congestive) and diastolic (congestive) heart failure: Secondary | ICD-10-CM | POA: Diagnosis not present

## 2015-05-23 DIAGNOSIS — I482 Chronic atrial fibrillation: Secondary | ICD-10-CM | POA: Diagnosis not present

## 2015-05-23 DIAGNOSIS — I2510 Atherosclerotic heart disease of native coronary artery without angina pectoris: Secondary | ICD-10-CM | POA: Diagnosis not present

## 2015-05-23 DIAGNOSIS — I214 Non-ST elevation (NSTEMI) myocardial infarction: Secondary | ICD-10-CM | POA: Diagnosis not present

## 2015-05-23 DIAGNOSIS — Z9981 Dependence on supplemental oxygen: Secondary | ICD-10-CM | POA: Diagnosis not present

## 2015-05-23 DIAGNOSIS — E785 Hyperlipidemia, unspecified: Secondary | ICD-10-CM | POA: Diagnosis not present

## 2015-05-23 DIAGNOSIS — I10 Essential (primary) hypertension: Secondary | ICD-10-CM | POA: Diagnosis not present

## 2015-05-24 DIAGNOSIS — I214 Non-ST elevation (NSTEMI) myocardial infarction: Secondary | ICD-10-CM | POA: Diagnosis not present

## 2015-05-24 DIAGNOSIS — I482 Chronic atrial fibrillation: Secondary | ICD-10-CM | POA: Diagnosis not present

## 2015-05-24 DIAGNOSIS — I2510 Atherosclerotic heart disease of native coronary artery without angina pectoris: Secondary | ICD-10-CM | POA: Diagnosis not present

## 2015-05-24 DIAGNOSIS — M1990 Unspecified osteoarthritis, unspecified site: Secondary | ICD-10-CM | POA: Diagnosis not present

## 2015-05-24 DIAGNOSIS — Z9981 Dependence on supplemental oxygen: Secondary | ICD-10-CM | POA: Diagnosis not present

## 2015-05-24 DIAGNOSIS — I10 Essential (primary) hypertension: Secondary | ICD-10-CM | POA: Diagnosis not present

## 2015-05-24 DIAGNOSIS — E785 Hyperlipidemia, unspecified: Secondary | ICD-10-CM | POA: Diagnosis not present

## 2015-05-24 DIAGNOSIS — I5040 Unspecified combined systolic (congestive) and diastolic (congestive) heart failure: Secondary | ICD-10-CM | POA: Diagnosis not present

## 2015-05-24 DIAGNOSIS — E119 Type 2 diabetes mellitus without complications: Secondary | ICD-10-CM | POA: Diagnosis not present

## 2015-05-25 DIAGNOSIS — I10 Essential (primary) hypertension: Secondary | ICD-10-CM | POA: Diagnosis not present

## 2015-05-25 DIAGNOSIS — I5040 Unspecified combined systolic (congestive) and diastolic (congestive) heart failure: Secondary | ICD-10-CM | POA: Diagnosis not present

## 2015-05-25 DIAGNOSIS — M1990 Unspecified osteoarthritis, unspecified site: Secondary | ICD-10-CM | POA: Diagnosis not present

## 2015-05-25 DIAGNOSIS — I482 Chronic atrial fibrillation: Secondary | ICD-10-CM | POA: Diagnosis not present

## 2015-05-25 DIAGNOSIS — I214 Non-ST elevation (NSTEMI) myocardial infarction: Secondary | ICD-10-CM | POA: Diagnosis not present

## 2015-05-25 DIAGNOSIS — Z9981 Dependence on supplemental oxygen: Secondary | ICD-10-CM | POA: Diagnosis not present

## 2015-05-25 DIAGNOSIS — E785 Hyperlipidemia, unspecified: Secondary | ICD-10-CM | POA: Diagnosis not present

## 2015-05-25 DIAGNOSIS — I2510 Atherosclerotic heart disease of native coronary artery without angina pectoris: Secondary | ICD-10-CM | POA: Diagnosis not present

## 2015-05-25 DIAGNOSIS — E119 Type 2 diabetes mellitus without complications: Secondary | ICD-10-CM | POA: Diagnosis not present

## 2015-05-28 DIAGNOSIS — E669 Obesity, unspecified: Secondary | ICD-10-CM | POA: Diagnosis not present

## 2015-05-28 DIAGNOSIS — E785 Hyperlipidemia, unspecified: Secondary | ICD-10-CM | POA: Diagnosis not present

## 2015-05-28 DIAGNOSIS — I10 Essential (primary) hypertension: Secondary | ICD-10-CM | POA: Diagnosis not present

## 2015-05-28 DIAGNOSIS — M1990 Unspecified osteoarthritis, unspecified site: Secondary | ICD-10-CM | POA: Diagnosis not present

## 2015-05-28 DIAGNOSIS — E119 Type 2 diabetes mellitus without complications: Secondary | ICD-10-CM | POA: Diagnosis not present

## 2015-05-28 DIAGNOSIS — I482 Chronic atrial fibrillation: Secondary | ICD-10-CM | POA: Diagnosis not present

## 2015-05-29 DIAGNOSIS — E119 Type 2 diabetes mellitus without complications: Secondary | ICD-10-CM | POA: Diagnosis not present

## 2015-05-29 DIAGNOSIS — M1990 Unspecified osteoarthritis, unspecified site: Secondary | ICD-10-CM | POA: Diagnosis not present

## 2015-05-29 DIAGNOSIS — E785 Hyperlipidemia, unspecified: Secondary | ICD-10-CM | POA: Diagnosis not present

## 2015-05-29 DIAGNOSIS — Z9981 Dependence on supplemental oxygen: Secondary | ICD-10-CM | POA: Diagnosis not present

## 2015-05-29 DIAGNOSIS — I214 Non-ST elevation (NSTEMI) myocardial infarction: Secondary | ICD-10-CM | POA: Diagnosis not present

## 2015-05-29 DIAGNOSIS — I10 Essential (primary) hypertension: Secondary | ICD-10-CM | POA: Diagnosis not present

## 2015-05-29 DIAGNOSIS — I482 Chronic atrial fibrillation: Secondary | ICD-10-CM | POA: Diagnosis not present

## 2015-05-29 DIAGNOSIS — I2510 Atherosclerotic heart disease of native coronary artery without angina pectoris: Secondary | ICD-10-CM | POA: Diagnosis not present

## 2015-05-29 DIAGNOSIS — I5040 Unspecified combined systolic (congestive) and diastolic (congestive) heart failure: Secondary | ICD-10-CM | POA: Diagnosis not present

## 2015-05-30 DIAGNOSIS — I1310 Hypertensive heart and chronic kidney disease without heart failure, with stage 1 through stage 4 chronic kidney disease, or unspecified chronic kidney disease: Secondary | ICD-10-CM | POA: Diagnosis not present

## 2015-05-30 DIAGNOSIS — R7301 Impaired fasting glucose: Secondary | ICD-10-CM | POA: Diagnosis not present

## 2015-05-30 DIAGNOSIS — Z79899 Other long term (current) drug therapy: Secondary | ICD-10-CM | POA: Diagnosis not present

## 2015-05-30 DIAGNOSIS — R55 Syncope and collapse: Secondary | ICD-10-CM | POA: Diagnosis not present

## 2015-05-30 DIAGNOSIS — N183 Chronic kidney disease, stage 3 (moderate): Secondary | ICD-10-CM | POA: Diagnosis not present

## 2015-05-30 DIAGNOSIS — I272 Other secondary pulmonary hypertension: Secondary | ICD-10-CM | POA: Diagnosis not present

## 2015-05-31 DIAGNOSIS — M1990 Unspecified osteoarthritis, unspecified site: Secondary | ICD-10-CM | POA: Diagnosis not present

## 2015-05-31 DIAGNOSIS — E119 Type 2 diabetes mellitus without complications: Secondary | ICD-10-CM | POA: Diagnosis not present

## 2015-05-31 DIAGNOSIS — I482 Chronic atrial fibrillation: Secondary | ICD-10-CM | POA: Diagnosis not present

## 2015-05-31 DIAGNOSIS — E785 Hyperlipidemia, unspecified: Secondary | ICD-10-CM | POA: Diagnosis not present

## 2015-05-31 DIAGNOSIS — I214 Non-ST elevation (NSTEMI) myocardial infarction: Secondary | ICD-10-CM | POA: Diagnosis not present

## 2015-05-31 DIAGNOSIS — I10 Essential (primary) hypertension: Secondary | ICD-10-CM | POA: Diagnosis not present

## 2015-05-31 DIAGNOSIS — Z9981 Dependence on supplemental oxygen: Secondary | ICD-10-CM | POA: Diagnosis not present

## 2015-05-31 DIAGNOSIS — I5040 Unspecified combined systolic (congestive) and diastolic (congestive) heart failure: Secondary | ICD-10-CM | POA: Diagnosis not present

## 2015-05-31 DIAGNOSIS — I2510 Atherosclerotic heart disease of native coronary artery without angina pectoris: Secondary | ICD-10-CM | POA: Diagnosis not present

## 2015-06-01 DIAGNOSIS — I214 Non-ST elevation (NSTEMI) myocardial infarction: Secondary | ICD-10-CM | POA: Diagnosis not present

## 2015-06-01 DIAGNOSIS — I2510 Atherosclerotic heart disease of native coronary artery without angina pectoris: Secondary | ICD-10-CM | POA: Diagnosis not present

## 2015-06-01 DIAGNOSIS — E785 Hyperlipidemia, unspecified: Secondary | ICD-10-CM | POA: Diagnosis not present

## 2015-06-01 DIAGNOSIS — Z9981 Dependence on supplemental oxygen: Secondary | ICD-10-CM | POA: Diagnosis not present

## 2015-06-01 DIAGNOSIS — I5040 Unspecified combined systolic (congestive) and diastolic (congestive) heart failure: Secondary | ICD-10-CM | POA: Diagnosis not present

## 2015-06-01 DIAGNOSIS — I10 Essential (primary) hypertension: Secondary | ICD-10-CM | POA: Diagnosis not present

## 2015-06-01 DIAGNOSIS — E119 Type 2 diabetes mellitus without complications: Secondary | ICD-10-CM | POA: Diagnosis not present

## 2015-06-01 DIAGNOSIS — M1990 Unspecified osteoarthritis, unspecified site: Secondary | ICD-10-CM | POA: Diagnosis not present

## 2015-06-01 DIAGNOSIS — I482 Chronic atrial fibrillation: Secondary | ICD-10-CM | POA: Diagnosis not present

## 2015-06-04 DIAGNOSIS — E119 Type 2 diabetes mellitus without complications: Secondary | ICD-10-CM | POA: Diagnosis not present

## 2015-06-04 DIAGNOSIS — I214 Non-ST elevation (NSTEMI) myocardial infarction: Secondary | ICD-10-CM | POA: Diagnosis not present

## 2015-06-04 DIAGNOSIS — I5040 Unspecified combined systolic (congestive) and diastolic (congestive) heart failure: Secondary | ICD-10-CM | POA: Diagnosis not present

## 2015-06-04 DIAGNOSIS — Z9981 Dependence on supplemental oxygen: Secondary | ICD-10-CM | POA: Diagnosis not present

## 2015-06-04 DIAGNOSIS — I10 Essential (primary) hypertension: Secondary | ICD-10-CM | POA: Diagnosis not present

## 2015-06-04 DIAGNOSIS — M1990 Unspecified osteoarthritis, unspecified site: Secondary | ICD-10-CM | POA: Diagnosis not present

## 2015-06-04 DIAGNOSIS — E785 Hyperlipidemia, unspecified: Secondary | ICD-10-CM | POA: Diagnosis not present

## 2015-06-04 DIAGNOSIS — I482 Chronic atrial fibrillation: Secondary | ICD-10-CM | POA: Diagnosis not present

## 2015-06-04 DIAGNOSIS — I2510 Atherosclerotic heart disease of native coronary artery without angina pectoris: Secondary | ICD-10-CM | POA: Diagnosis not present

## 2015-06-07 DIAGNOSIS — E119 Type 2 diabetes mellitus without complications: Secondary | ICD-10-CM | POA: Diagnosis not present

## 2015-06-07 DIAGNOSIS — I10 Essential (primary) hypertension: Secondary | ICD-10-CM | POA: Diagnosis not present

## 2015-06-07 DIAGNOSIS — E785 Hyperlipidemia, unspecified: Secondary | ICD-10-CM | POA: Diagnosis not present

## 2015-06-07 DIAGNOSIS — Z9981 Dependence on supplemental oxygen: Secondary | ICD-10-CM | POA: Diagnosis not present

## 2015-06-07 DIAGNOSIS — M1990 Unspecified osteoarthritis, unspecified site: Secondary | ICD-10-CM | POA: Diagnosis not present

## 2015-06-07 DIAGNOSIS — I482 Chronic atrial fibrillation: Secondary | ICD-10-CM | POA: Diagnosis not present

## 2015-06-07 DIAGNOSIS — I214 Non-ST elevation (NSTEMI) myocardial infarction: Secondary | ICD-10-CM | POA: Diagnosis not present

## 2015-06-07 DIAGNOSIS — I5040 Unspecified combined systolic (congestive) and diastolic (congestive) heart failure: Secondary | ICD-10-CM | POA: Diagnosis not present

## 2015-06-07 DIAGNOSIS — I2510 Atherosclerotic heart disease of native coronary artery without angina pectoris: Secondary | ICD-10-CM | POA: Diagnosis not present

## 2015-06-08 ENCOUNTER — Encounter: Payer: Self-pay | Admitting: Internal Medicine

## 2015-06-08 ENCOUNTER — Other Ambulatory Visit (INDEPENDENT_AMBULATORY_CARE_PROVIDER_SITE_OTHER): Payer: Commercial Managed Care - HMO

## 2015-06-08 ENCOUNTER — Ambulatory Visit (INDEPENDENT_AMBULATORY_CARE_PROVIDER_SITE_OTHER)
Admission: RE | Admit: 2015-06-08 | Discharge: 2015-06-08 | Disposition: A | Discharge status: Home / Self Care | Payer: Commercial Managed Care - HMO | Source: Ambulatory Visit | Attending: Internal Medicine | Admitting: Internal Medicine

## 2015-06-08 ENCOUNTER — Ambulatory Visit (INDEPENDENT_AMBULATORY_CARE_PROVIDER_SITE_OTHER): Payer: Commercial Managed Care - HMO | Admitting: Internal Medicine

## 2015-06-08 VITALS — BP 136/80 | HR 100 | Ht 64.0 in | Wt 240.0 lb

## 2015-06-08 DIAGNOSIS — I482 Chronic atrial fibrillation: Secondary | ICD-10-CM | POA: Diagnosis not present

## 2015-06-08 DIAGNOSIS — I10 Essential (primary) hypertension: Secondary | ICD-10-CM | POA: Diagnosis not present

## 2015-06-08 DIAGNOSIS — R0600 Dyspnea, unspecified: Secondary | ICD-10-CM | POA: Insufficient documentation

## 2015-06-08 DIAGNOSIS — Z9981 Dependence on supplemental oxygen: Secondary | ICD-10-CM | POA: Diagnosis not present

## 2015-06-08 DIAGNOSIS — E119 Type 2 diabetes mellitus without complications: Secondary | ICD-10-CM | POA: Diagnosis not present

## 2015-06-08 DIAGNOSIS — J9612 Chronic respiratory failure with hypercapnia: Secondary | ICD-10-CM | POA: Diagnosis not present

## 2015-06-08 DIAGNOSIS — I509 Heart failure, unspecified: Secondary | ICD-10-CM | POA: Diagnosis not present

## 2015-06-08 DIAGNOSIS — I340 Nonrheumatic mitral (valve) insufficiency: Secondary | ICD-10-CM | POA: Diagnosis not present

## 2015-06-08 DIAGNOSIS — J811 Chronic pulmonary edema: Secondary | ICD-10-CM | POA: Diagnosis not present

## 2015-06-08 DIAGNOSIS — I5040 Unspecified combined systolic (congestive) and diastolic (congestive) heart failure: Secondary | ICD-10-CM | POA: Diagnosis not present

## 2015-06-08 DIAGNOSIS — I2510 Atherosclerotic heart disease of native coronary artery without angina pectoris: Secondary | ICD-10-CM | POA: Diagnosis not present

## 2015-06-08 DIAGNOSIS — M1990 Unspecified osteoarthritis, unspecified site: Secondary | ICD-10-CM | POA: Diagnosis not present

## 2015-06-08 DIAGNOSIS — E785 Hyperlipidemia, unspecified: Secondary | ICD-10-CM | POA: Diagnosis not present

## 2015-06-08 DIAGNOSIS — I214 Non-ST elevation (NSTEMI) myocardial infarction: Secondary | ICD-10-CM | POA: Diagnosis not present

## 2015-06-08 LAB — CBC WITH DIFFERENTIAL/PLATELET
BASOS PCT: 0.1 % (ref 0.0–3.0)
Basophils Absolute: 0 10*3/uL (ref 0.0–0.1)
EOS PCT: 4 % (ref 0.0–5.0)
Eosinophils Absolute: 0.3 10*3/uL (ref 0.0–0.7)
HEMATOCRIT: 34 % — AB (ref 36.0–46.0)
Hemoglobin: 10.6 g/dL — ABNORMAL LOW (ref 12.0–15.0)
LYMPHS ABS: 1.5 10*3/uL (ref 0.7–4.0)
LYMPHS PCT: 23.2 % (ref 12.0–46.0)
MCHC: 31.1 g/dL (ref 30.0–36.0)
MCV: 95.3 fl (ref 78.0–100.0)
MONOS PCT: 9.4 % (ref 3.0–12.0)
Monocytes Absolute: 0.6 10*3/uL (ref 0.1–1.0)
NEUTROS ABS: 4.1 10*3/uL (ref 1.4–7.7)
Neutrophils Relative %: 63.3 % (ref 43.0–77.0)
Platelets: 154 10*3/uL (ref 150.0–400.0)
RBC: 3.57 Mil/uL — ABNORMAL LOW (ref 3.87–5.11)
RDW: 18.4 % — ABNORMAL HIGH (ref 11.5–15.5)
WBC: 6.5 10*3/uL (ref 4.0–10.5)

## 2015-06-08 LAB — BASIC METABOLIC PANEL
BUN: 13 mg/dL (ref 6–23)
CALCIUM: 9.2 mg/dL (ref 8.4–10.5)
CHLORIDE: 93 meq/L — AB (ref 96–112)
CO2: 39 meq/L — AB (ref 19–32)
CREATININE: 0.93 mg/dL (ref 0.40–1.20)
GFR: 73.42 mL/min (ref >60.00)
GLUCOSE: 119 mg/dL — AB (ref 70–99)
Potassium: 3.7 mEq/L (ref 3.5–5.1)
Sodium: 138 mEq/L (ref 135–145)

## 2015-06-08 LAB — BRAIN NATRIURETIC PEPTIDE: PRO B NATRI PEPTIDE: 370 pg/mL — AB (ref 0.0–100.0)

## 2015-06-08 NOTE — Progress Notes (Signed)
Subjective:     Patient ID: [name], female   DOB: 1926-05-23,     MRN: [id number]  HPI   25 yobf with morbid obesity complicated by aodm/ hbp/afib on chronic coumadin  and OHS s/ p mulitple admits referred by Faylene Million for worsening sats.  DATE OF ADMISSION: 05/19/2015 DATE OF DISCHARGE: 05/21/2015 DISCHARGE DIAGNOSES: 1. Status post fall/dislocation left shoulder status post  hypoglycemia. 2. Chronic atrial fibrillation. 3. Status post digoxin toxicity. 4. Compensated congestive heart failure secondary to preserved LV  systolic function. 5. Status post recent probable very small non-Q-wave myocardial  infarction with atypical presentation, status post left cath, noted  to have mild coronary artery disease. 6. Hypertension. 7. Diabetes mellitus, controlled by diet. 8. Hypercholesteremia. 9. History of DVT in the past. 10.History of cancer of breast. 11.Degenerative joint disease. 12.Status post left cataract surgery, recently. 13.History of upper gastrointestinal bleed in the past.  MEDICATIONS: Discharge home medications are; 1. Metoprolol tartrate 25 mg half tablet twice daily. 2. Atorvastatin 20 mg daily. 3. Lasix 20 mg daily. 4. Gabapentin 300 mg every morning. 5. Acular ophthalmic solution as before. 6. Ofloxacin ophthalmic solution as before. 7. Protonix 40 mg daily. 8. Prednisolone acetate 1% ophthalmic suspension as before. 9. Warfarin 1 tablet 5 days a week and 2 mg 1 tablet 5 days a week and  2 tablets on Thursday and Saturday.  The patient has been advised to stop taking carvedilol, digoxin, glimepiride, and potassium chloride.  DIET: Low salt, low cholesterol 1800 calories ADA diet.  Monitor blood sugar and blood pressure daily and chart.  CONDITION AT DISCHARGE: Stable.  BRIEF HISTORY AND HOSPITAL COURSE: [name] is an 81 year old female with past medical history significant for multiple medical problems,  i.e., hypertension, diabetes mellitus, chronic atrial fibrillation, history of congestive heart failure secondary to preserved LV systolic function, history of DVT, history of CA of breast, history of degenerative joint disease, hypercholesteremia, history of syncope in the past, recently discharged from the hospital a few days ago, following probable very small non-Q-wave myocardial infarction requiring cardiac catheterization which showed mild coronary artery disease. She came to the ED and was found by her family lying on the floor when EMS arrived. Apparently, the patient's CBG was only 17 for which she was given oral glucose and D50. There was no efferent respiratory distress. The patient came to consciousness. She is not a good historian, but was lethargic of note. In the ED, the patient was hemodynamically stable. Her CBG on admission was 32, 37, but improved with D50 to 120. CT of the brain showed scalp hematoma, otherwise no abnormalities. Left shoulder x-ray showed anterior-inferior dislocation of the left humeral head, and this was reduced in the ED. The patient was admitted by triad hospitalist and was transferred to my service upon the patient's request.   LABS: Hemoglobin was 11.9, hematocrit 38.6, white count of 6.8. X-ray of the shoulder showed anterior-inferior dislocation of the left femoral head. Repeat x-ray showed interval reduction of glenohumeral dislocation. Her blood sugar today is 111. Sodium was 136, potassium 4.6, BUN 9, creatinine 0.89. Her PT/INR today; PT is 23, INR 2.05.  BRIEF HOSPITAL COURSE: The patient was admitted by triad hospitalist. Her Amaryl was discontinued and continued on home medications. The patient had a brief episode of AFib with RVR on the monitor. She was started on p.o. low-dose Lopressor and subsequently had a pause up to 2.1 second. She was asymptomatic. Her digoxin level was mildly elevated to  2.3, digoxin has  been discontinue. The patient otherwise remains in AFib with controlled ventricular response. The patient is eager to go home and will be discharged home on above medications and will be followed up in my office in 1 week and PMD as scheduled.    06/08/2015 1st [facility] office visit/ Derak Schurman   Chief Complaint  Patient presents with  . Pulmonary Consult    Referred by Dr. Jefferson Fuel. Pt states o2 sats have been decreased for the past month and has been having "blackouts" with exertion. She gets out of breath just walking from room to room. Before this started she was exercising daily without any problems.   very sedentary/ riding cart at [facility] x years/ only good activity tolerance prior to recent admits was sitting on bike  then aburptly after eye surgery woke up sluggish that same day fell off toilet > admitted x 2 with hypoglycemia on admit and LHC done was ok  Except for 3+ MR / RHC not done/ at home doe/seeing spots after walking 10 ft Since discharge increasing fluid in legs just increased lasix 06/04/15 Now has trouble lying down and props way up  Now on 02 and wasn't prior to admit and fm not sure what settings should be they vary it from 3-4 liters but poor insight into 02 with exertion vs sitting.  No obvious day to day or daytime variabilty or assoc chronic cough or cp or chest tightness, subjective wheeze overt sinus or hb symptoms. No unusual exp hx or h/o childhood pna/ asthma or knowledge of premature birth.  Sleeping ok propped up 30 degrees on 02 without nocturnal  or early am exacerbation  of respiratory  c/o's or am HA or ams. Also denies any obvious fluctuation of symptoms with weather or environmental changes or other aggravating or alleviating factors except as outlined above   Current Medications, Allergies, Complete Past Medical History, Past Surgical History, Family History, and Social History were reviewed in [employer] record.  ROS  The  following are not active complaints unless bolded sore throat, dysphagia, dental problems, itching, sneezing,  nasal congestion or excess/ purulent secretions, ear ache,   fever, chills, sweats, unintended wt loss, pleuritic or exertional cp, hemoptysis,  orthopnea pnd or leg swelling, presyncope, palpitations, abdominal pain, anorexia, nausea, vomiting, diarrhea  or change in bowel or urinary habits, change in stools or urine, dysuria,hematuria,  rash, arthralgias, visual complaints, headache, numbness weakness or ataxia or problems with walking or coordination,  change in mood/affect or memory.        Review of Systems     Objective:   Physical Exam  obese bf nad in w/c    Wt Readings from Last 3 Encounters:  06/08/15 240 lb (108.863 kg)  05/21/15 219 lb 11.2 oz (99.655 kg)  05/13/15 218 lb 14.7 oz (99.3 kg)    Vital signs reviewed    HEENT: nl dentition, turbinates, and orophanx. Nl external ear canals without cough reflex   NECK :  without JVD/Nodes/TM/ nl carotid upstrokes bilaterally   LUNGS: no acc muscle use, clear to A and P bilaterally without cough on insp or exp maneuvers   CV:  IRIR   no s3  distant HSM or increase in P2, massive edema both lower ext   ABD:  soft and nontender with nl excursion in the supine position. No bruits or organomegaly, bowel sounds nl  MS:  warm without deformities, calf tenderness, cyanosis or clubbing  SKIN:  warm and dry without lesions    NEURO:  alert, approp, no deficits    I personally reviewed images and agree with radiology impression as follows:  CXR  06/08/15 Congestive heart failure with moderately severe pulmonary edema.  Labs ordered/ reviewed:   Lab 06/08/15 1619  NA 138  K 3.7  CL 93*  CO2 39*  BUN 13  CREATININE 0.93  GLUCOSE 119*   Lab 06/08/15 1619  HGB 10.6*  HCT 34.0*  WBC 6.5  PLT 154.0     Lab Results  Component Value Date   TSH 0.822 05/09/2015     Lab Results  Component Value Date    PROBNP 370.0* 06/08/2015        Assessment:

## 2015-06-08 NOTE — Patient Instructions (Addendum)
Change 02 to 3lpm 24/7 and 4 lpm with activity   We will be ordering an overnight study to see if the 3lpm is ad3quate   Please remember to go to the lab and x-ray department downstairs for your tests - we will call you with the results when they are available.    Please schedule a follow up office visit in 4 weeks, sooner if needed

## 2015-06-08 NOTE — Progress Notes (Signed)
Quick Note:  lmtcb ______ 

## 2015-06-09 ENCOUNTER — Encounter: Payer: Self-pay | Admitting: Internal Medicine

## 2015-06-09 DIAGNOSIS — J9612 Chronic respiratory failure with hypercapnia: Secondary | ICD-10-CM | POA: Insufficient documentation

## 2015-06-09 NOTE — Assessment & Plan Note (Addendum)
Body mass index is 41.18 kg/(m^2).  Lab Results  Component Value Date   TSH 0.822 05/09/2015     Contributing to OHS/doe/ needs to achieve and maintain neg calorie balance >f/u primary care

## 2015-06-09 NOTE — Assessment & Plan Note (Addendum)
HC03 39 06/08/15  - 02 rx at discharge 05/2015  - 06/08/2015   Walked 4lpm x 10 ft at stopped due to sob/ fatigue with sats still 89%   rec as of 06/08/15 :   02 3lpm 24/7 x 4 lpm with ex in absence of am ha or ams related to hypercarbia   She will need a formal sleep eval once she is optimally managed in terms of diuresis and afterload reduction to improve MR   .I had an extended discussion with the patient and daughter/gdaughter reviewing all relevant studies completed to date including both admits in detail  x 35 m total  Needs ono on 02 to assure 3lpm adequate > ordered   Each maintenance medication was reviewed in detail including most importantly the difference between maintenance and prns and under what circumstances the prns are to be triggered using an action plan format that is not reflected in the computer generated alphabetically organized AVS.    Please see instructions for details which were reviewed in writing and the patient given a copy highlighting the part that I personally wrote and discussed at today's ov.

## 2015-06-09 NOTE — Assessment & Plan Note (Signed)
Multifactorial but most recent / acute problems appear to be  Due to severe MR with PH and need to be managed aggressively by cardiology and then perhaps 02 can be adjusted down over time - see chronic respiratory failure.

## 2015-06-09 NOTE — Assessment & Plan Note (Addendum)
See [facility] 05/11/15 no RHC done and she could have a degree of PH from OHS/ chronic hypoxemia but this would be longstanding and might  explain peripheral but not pulmonary edema   She is clearly vol overloaded at present / management per Dr Terrence Dupont, strongly consider [facility] next admit.

## 2015-06-12 DIAGNOSIS — I5043 Acute on chronic combined systolic (congestive) and diastolic (congestive) heart failure: Secondary | ICD-10-CM | POA: Diagnosis not present

## 2015-06-12 DIAGNOSIS — I10 Essential (primary) hypertension: Secondary | ICD-10-CM | POA: Diagnosis not present

## 2015-06-12 DIAGNOSIS — I482 Chronic atrial fibrillation: Secondary | ICD-10-CM | POA: Diagnosis not present

## 2015-06-12 DIAGNOSIS — I4891 Unspecified atrial fibrillation: Secondary | ICD-10-CM | POA: Diagnosis not present

## 2015-06-12 DIAGNOSIS — K922 Gastrointestinal hemorrhage, unspecified: Secondary | ICD-10-CM | POA: Diagnosis not present

## 2015-06-12 DIAGNOSIS — E119 Type 2 diabetes mellitus without complications: Secondary | ICD-10-CM | POA: Diagnosis not present

## 2015-06-12 DIAGNOSIS — Z9981 Dependence on supplemental oxygen: Secondary | ICD-10-CM | POA: Diagnosis not present

## 2015-06-12 DIAGNOSIS — K264 Chronic or unspecified duodenal ulcer with hemorrhage: Secondary | ICD-10-CM | POA: Diagnosis not present

## 2015-06-12 DIAGNOSIS — M1990 Unspecified osteoarthritis, unspecified site: Secondary | ICD-10-CM | POA: Diagnosis not present

## 2015-06-12 DIAGNOSIS — I5040 Unspecified combined systolic (congestive) and diastolic (congestive) heart failure: Secondary | ICD-10-CM | POA: Diagnosis not present

## 2015-06-12 DIAGNOSIS — E785 Hyperlipidemia, unspecified: Secondary | ICD-10-CM | POA: Diagnosis not present

## 2015-06-12 DIAGNOSIS — I214 Non-ST elevation (NSTEMI) myocardial infarction: Secondary | ICD-10-CM | POA: Diagnosis not present

## 2015-06-12 DIAGNOSIS — I2510 Atherosclerotic heart disease of native coronary artery without angina pectoris: Secondary | ICD-10-CM | POA: Diagnosis not present

## 2015-06-13 DIAGNOSIS — Z9981 Dependence on supplemental oxygen: Secondary | ICD-10-CM | POA: Diagnosis not present

## 2015-06-13 DIAGNOSIS — M1712 Unilateral primary osteoarthritis, left knee: Secondary | ICD-10-CM | POA: Diagnosis not present

## 2015-06-13 DIAGNOSIS — M1711 Unilateral primary osteoarthritis, right knee: Secondary | ICD-10-CM | POA: Diagnosis not present

## 2015-06-13 DIAGNOSIS — I482 Chronic atrial fibrillation: Secondary | ICD-10-CM | POA: Diagnosis not present

## 2015-06-13 DIAGNOSIS — I10 Essential (primary) hypertension: Secondary | ICD-10-CM | POA: Diagnosis not present

## 2015-06-13 DIAGNOSIS — I214 Non-ST elevation (NSTEMI) myocardial infarction: Secondary | ICD-10-CM | POA: Diagnosis not present

## 2015-06-13 DIAGNOSIS — E119 Type 2 diabetes mellitus without complications: Secondary | ICD-10-CM | POA: Diagnosis not present

## 2015-06-13 DIAGNOSIS — E785 Hyperlipidemia, unspecified: Secondary | ICD-10-CM | POA: Diagnosis not present

## 2015-06-13 DIAGNOSIS — I5040 Unspecified combined systolic (congestive) and diastolic (congestive) heart failure: Secondary | ICD-10-CM | POA: Diagnosis not present

## 2015-06-13 DIAGNOSIS — I2510 Atherosclerotic heart disease of native coronary artery without angina pectoris: Secondary | ICD-10-CM | POA: Diagnosis not present

## 2015-06-13 DIAGNOSIS — M1990 Unspecified osteoarthritis, unspecified site: Secondary | ICD-10-CM | POA: Diagnosis not present

## 2015-06-13 NOTE — Progress Notes (Signed)
Quick Note:  ATC, NA and no option to leave msg, WCB ______ 

## 2015-06-14 DIAGNOSIS — I214 Non-ST elevation (NSTEMI) myocardial infarction: Secondary | ICD-10-CM | POA: Diagnosis not present

## 2015-06-14 DIAGNOSIS — I10 Essential (primary) hypertension: Secondary | ICD-10-CM | POA: Diagnosis not present

## 2015-06-14 DIAGNOSIS — Z9981 Dependence on supplemental oxygen: Secondary | ICD-10-CM | POA: Diagnosis not present

## 2015-06-14 DIAGNOSIS — I2510 Atherosclerotic heart disease of native coronary artery without angina pectoris: Secondary | ICD-10-CM | POA: Diagnosis not present

## 2015-06-14 DIAGNOSIS — M1990 Unspecified osteoarthritis, unspecified site: Secondary | ICD-10-CM | POA: Diagnosis not present

## 2015-06-14 DIAGNOSIS — E119 Type 2 diabetes mellitus without complications: Secondary | ICD-10-CM | POA: Diagnosis not present

## 2015-06-14 DIAGNOSIS — I482 Chronic atrial fibrillation: Secondary | ICD-10-CM | POA: Diagnosis not present

## 2015-06-14 DIAGNOSIS — I5040 Unspecified combined systolic (congestive) and diastolic (congestive) heart failure: Secondary | ICD-10-CM | POA: Diagnosis not present

## 2015-06-14 DIAGNOSIS — R55 Syncope and collapse: Secondary | ICD-10-CM | POA: Diagnosis not present

## 2015-06-14 DIAGNOSIS — E785 Hyperlipidemia, unspecified: Secondary | ICD-10-CM | POA: Diagnosis not present

## 2015-06-14 DIAGNOSIS — R404 Transient alteration of awareness: Secondary | ICD-10-CM | POA: Diagnosis not present

## 2015-06-18 ENCOUNTER — Other Ambulatory Visit: Payer: Self-pay | Admitting: Oncology

## 2015-06-18 ENCOUNTER — Encounter: Payer: Self-pay | Admitting: *Deleted

## 2015-06-18 DIAGNOSIS — Z9981 Dependence on supplemental oxygen: Secondary | ICD-10-CM | POA: Diagnosis not present

## 2015-06-18 DIAGNOSIS — M1990 Unspecified osteoarthritis, unspecified site: Secondary | ICD-10-CM | POA: Diagnosis not present

## 2015-06-18 DIAGNOSIS — I214 Non-ST elevation (NSTEMI) myocardial infarction: Secondary | ICD-10-CM | POA: Diagnosis not present

## 2015-06-18 DIAGNOSIS — I482 Chronic atrial fibrillation: Secondary | ICD-10-CM | POA: Diagnosis not present

## 2015-06-18 DIAGNOSIS — I5040 Unspecified combined systolic (congestive) and diastolic (congestive) heart failure: Secondary | ICD-10-CM | POA: Diagnosis not present

## 2015-06-18 DIAGNOSIS — E119 Type 2 diabetes mellitus without complications: Secondary | ICD-10-CM | POA: Diagnosis not present

## 2015-06-18 DIAGNOSIS — E785 Hyperlipidemia, unspecified: Secondary | ICD-10-CM | POA: Diagnosis not present

## 2015-06-18 DIAGNOSIS — I2510 Atherosclerotic heart disease of native coronary artery without angina pectoris: Secondary | ICD-10-CM | POA: Diagnosis not present

## 2015-06-18 DIAGNOSIS — I10 Essential (primary) hypertension: Secondary | ICD-10-CM | POA: Diagnosis not present

## 2015-06-18 DIAGNOSIS — Z853 Personal history of malignant neoplasm of breast: Secondary | ICD-10-CM

## 2015-06-18 NOTE — Progress Notes (Signed)
Quick Note:  ATC, NA and no option to leave msg, WCB ______ 

## 2015-06-18 NOTE — Progress Notes (Signed)
Quick Note:    Letter mailed.  ______

## 2015-06-19 DIAGNOSIS — E119 Type 2 diabetes mellitus without complications: Secondary | ICD-10-CM | POA: Diagnosis not present

## 2015-06-19 DIAGNOSIS — M1990 Unspecified osteoarthritis, unspecified site: Secondary | ICD-10-CM | POA: Diagnosis not present

## 2015-06-19 DIAGNOSIS — I482 Chronic atrial fibrillation: Secondary | ICD-10-CM | POA: Diagnosis not present

## 2015-06-19 DIAGNOSIS — E785 Hyperlipidemia, unspecified: Secondary | ICD-10-CM | POA: Diagnosis not present

## 2015-06-19 DIAGNOSIS — I2510 Atherosclerotic heart disease of native coronary artery without angina pectoris: Secondary | ICD-10-CM | POA: Diagnosis not present

## 2015-06-19 DIAGNOSIS — I10 Essential (primary) hypertension: Secondary | ICD-10-CM | POA: Diagnosis not present

## 2015-06-19 DIAGNOSIS — Z9981 Dependence on supplemental oxygen: Secondary | ICD-10-CM | POA: Diagnosis not present

## 2015-06-19 DIAGNOSIS — I5040 Unspecified combined systolic (congestive) and diastolic (congestive) heart failure: Secondary | ICD-10-CM | POA: Diagnosis not present

## 2015-06-19 DIAGNOSIS — I214 Non-ST elevation (NSTEMI) myocardial infarction: Secondary | ICD-10-CM | POA: Diagnosis not present

## 2015-06-20 DIAGNOSIS — I2510 Atherosclerotic heart disease of native coronary artery without angina pectoris: Secondary | ICD-10-CM | POA: Diagnosis not present

## 2015-06-20 DIAGNOSIS — E119 Type 2 diabetes mellitus without complications: Secondary | ICD-10-CM | POA: Diagnosis not present

## 2015-06-20 DIAGNOSIS — I482 Chronic atrial fibrillation: Secondary | ICD-10-CM | POA: Diagnosis not present

## 2015-06-20 DIAGNOSIS — M1990 Unspecified osteoarthritis, unspecified site: Secondary | ICD-10-CM | POA: Diagnosis not present

## 2015-06-20 DIAGNOSIS — E785 Hyperlipidemia, unspecified: Secondary | ICD-10-CM | POA: Diagnosis not present

## 2015-06-20 DIAGNOSIS — I5040 Unspecified combined systolic (congestive) and diastolic (congestive) heart failure: Secondary | ICD-10-CM | POA: Diagnosis not present

## 2015-06-20 DIAGNOSIS — I10 Essential (primary) hypertension: Secondary | ICD-10-CM | POA: Diagnosis not present

## 2015-06-20 DIAGNOSIS — Z9981 Dependence on supplemental oxygen: Secondary | ICD-10-CM | POA: Diagnosis not present

## 2015-06-20 DIAGNOSIS — I214 Non-ST elevation (NSTEMI) myocardial infarction: Secondary | ICD-10-CM | POA: Diagnosis not present

## 2015-06-21 DIAGNOSIS — M1990 Unspecified osteoarthritis, unspecified site: Secondary | ICD-10-CM | POA: Diagnosis not present

## 2015-06-21 DIAGNOSIS — E785 Hyperlipidemia, unspecified: Secondary | ICD-10-CM | POA: Diagnosis not present

## 2015-06-21 DIAGNOSIS — I2510 Atherosclerotic heart disease of native coronary artery without angina pectoris: Secondary | ICD-10-CM | POA: Diagnosis not present

## 2015-06-21 DIAGNOSIS — I482 Chronic atrial fibrillation: Secondary | ICD-10-CM | POA: Diagnosis not present

## 2015-06-21 DIAGNOSIS — I10 Essential (primary) hypertension: Secondary | ICD-10-CM | POA: Diagnosis not present

## 2015-06-21 DIAGNOSIS — I5040 Unspecified combined systolic (congestive) and diastolic (congestive) heart failure: Secondary | ICD-10-CM | POA: Diagnosis not present

## 2015-06-21 DIAGNOSIS — Z9981 Dependence on supplemental oxygen: Secondary | ICD-10-CM | POA: Diagnosis not present

## 2015-06-21 DIAGNOSIS — I214 Non-ST elevation (NSTEMI) myocardial infarction: Secondary | ICD-10-CM | POA: Diagnosis not present

## 2015-06-21 DIAGNOSIS — E119 Type 2 diabetes mellitus without complications: Secondary | ICD-10-CM | POA: Diagnosis not present

## 2015-06-25 DIAGNOSIS — M25561 Pain in right knee: Secondary | ICD-10-CM | POA: Diagnosis not present

## 2015-06-25 DIAGNOSIS — M1711 Unilateral primary osteoarthritis, right knee: Secondary | ICD-10-CM | POA: Diagnosis not present

## 2015-06-26 DIAGNOSIS — I482 Chronic atrial fibrillation: Secondary | ICD-10-CM | POA: Diagnosis not present

## 2015-06-26 DIAGNOSIS — E785 Hyperlipidemia, unspecified: Secondary | ICD-10-CM | POA: Diagnosis not present

## 2015-06-26 DIAGNOSIS — Z9981 Dependence on supplemental oxygen: Secondary | ICD-10-CM | POA: Diagnosis not present

## 2015-06-26 DIAGNOSIS — I214 Non-ST elevation (NSTEMI) myocardial infarction: Secondary | ICD-10-CM | POA: Diagnosis not present

## 2015-06-26 DIAGNOSIS — E119 Type 2 diabetes mellitus without complications: Secondary | ICD-10-CM | POA: Diagnosis not present

## 2015-06-26 DIAGNOSIS — I10 Essential (primary) hypertension: Secondary | ICD-10-CM | POA: Diagnosis not present

## 2015-06-26 DIAGNOSIS — I2510 Atherosclerotic heart disease of native coronary artery without angina pectoris: Secondary | ICD-10-CM | POA: Diagnosis not present

## 2015-06-26 DIAGNOSIS — I5040 Unspecified combined systolic (congestive) and diastolic (congestive) heart failure: Secondary | ICD-10-CM | POA: Diagnosis not present

## 2015-06-26 DIAGNOSIS — M1990 Unspecified osteoarthritis, unspecified site: Secondary | ICD-10-CM | POA: Diagnosis not present

## 2015-06-27 DIAGNOSIS — I214 Non-ST elevation (NSTEMI) myocardial infarction: Secondary | ICD-10-CM | POA: Diagnosis not present

## 2015-06-27 DIAGNOSIS — E785 Hyperlipidemia, unspecified: Secondary | ICD-10-CM | POA: Diagnosis not present

## 2015-06-27 DIAGNOSIS — I10 Essential (primary) hypertension: Secondary | ICD-10-CM | POA: Diagnosis not present

## 2015-06-27 DIAGNOSIS — I2510 Atherosclerotic heart disease of native coronary artery without angina pectoris: Secondary | ICD-10-CM | POA: Diagnosis not present

## 2015-06-27 DIAGNOSIS — I482 Chronic atrial fibrillation: Secondary | ICD-10-CM | POA: Diagnosis not present

## 2015-06-27 DIAGNOSIS — M1990 Unspecified osteoarthritis, unspecified site: Secondary | ICD-10-CM | POA: Diagnosis not present

## 2015-06-27 DIAGNOSIS — E119 Type 2 diabetes mellitus without complications: Secondary | ICD-10-CM | POA: Diagnosis not present

## 2015-06-27 DIAGNOSIS — Z9981 Dependence on supplemental oxygen: Secondary | ICD-10-CM | POA: Diagnosis not present

## 2015-06-27 DIAGNOSIS — I5040 Unspecified combined systolic (congestive) and diastolic (congestive) heart failure: Secondary | ICD-10-CM | POA: Diagnosis not present

## 2015-06-28 DIAGNOSIS — I214 Non-ST elevation (NSTEMI) myocardial infarction: Secondary | ICD-10-CM | POA: Diagnosis not present

## 2015-06-28 DIAGNOSIS — I482 Chronic atrial fibrillation: Secondary | ICD-10-CM | POA: Diagnosis not present

## 2015-06-28 DIAGNOSIS — H2511 Age-related nuclear cataract, right eye: Secondary | ICD-10-CM | POA: Diagnosis not present

## 2015-06-28 DIAGNOSIS — E785 Hyperlipidemia, unspecified: Secondary | ICD-10-CM | POA: Diagnosis not present

## 2015-06-28 DIAGNOSIS — Z9981 Dependence on supplemental oxygen: Secondary | ICD-10-CM | POA: Diagnosis not present

## 2015-06-28 DIAGNOSIS — E119 Type 2 diabetes mellitus without complications: Secondary | ICD-10-CM | POA: Diagnosis not present

## 2015-06-28 DIAGNOSIS — I5040 Unspecified combined systolic (congestive) and diastolic (congestive) heart failure: Secondary | ICD-10-CM | POA: Diagnosis not present

## 2015-06-28 DIAGNOSIS — M1990 Unspecified osteoarthritis, unspecified site: Secondary | ICD-10-CM | POA: Diagnosis not present

## 2015-06-28 DIAGNOSIS — I10 Essential (primary) hypertension: Secondary | ICD-10-CM | POA: Diagnosis not present

## 2015-06-28 DIAGNOSIS — I2510 Atherosclerotic heart disease of native coronary artery without angina pectoris: Secondary | ICD-10-CM | POA: Diagnosis not present

## 2015-07-03 DIAGNOSIS — E119 Type 2 diabetes mellitus without complications: Secondary | ICD-10-CM | POA: Diagnosis not present

## 2015-07-03 DIAGNOSIS — I2510 Atherosclerotic heart disease of native coronary artery without angina pectoris: Secondary | ICD-10-CM | POA: Diagnosis not present

## 2015-07-03 DIAGNOSIS — I10 Essential (primary) hypertension: Secondary | ICD-10-CM | POA: Diagnosis not present

## 2015-07-03 DIAGNOSIS — I5040 Unspecified combined systolic (congestive) and diastolic (congestive) heart failure: Secondary | ICD-10-CM | POA: Diagnosis not present

## 2015-07-03 DIAGNOSIS — I482 Chronic atrial fibrillation: Secondary | ICD-10-CM | POA: Diagnosis not present

## 2015-07-03 DIAGNOSIS — I214 Non-ST elevation (NSTEMI) myocardial infarction: Secondary | ICD-10-CM | POA: Diagnosis not present

## 2015-07-03 DIAGNOSIS — E785 Hyperlipidemia, unspecified: Secondary | ICD-10-CM | POA: Diagnosis not present

## 2015-07-03 DIAGNOSIS — M1990 Unspecified osteoarthritis, unspecified site: Secondary | ICD-10-CM | POA: Diagnosis not present

## 2015-07-03 DIAGNOSIS — Z9981 Dependence on supplemental oxygen: Secondary | ICD-10-CM | POA: Diagnosis not present

## 2015-07-04 DIAGNOSIS — I10 Essential (primary) hypertension: Secondary | ICD-10-CM | POA: Diagnosis not present

## 2015-07-04 DIAGNOSIS — Z9981 Dependence on supplemental oxygen: Secondary | ICD-10-CM | POA: Diagnosis not present

## 2015-07-04 DIAGNOSIS — I2510 Atherosclerotic heart disease of native coronary artery without angina pectoris: Secondary | ICD-10-CM | POA: Diagnosis not present

## 2015-07-04 DIAGNOSIS — I214 Non-ST elevation (NSTEMI) myocardial infarction: Secondary | ICD-10-CM | POA: Diagnosis not present

## 2015-07-04 DIAGNOSIS — I5040 Unspecified combined systolic (congestive) and diastolic (congestive) heart failure: Secondary | ICD-10-CM | POA: Diagnosis not present

## 2015-07-04 DIAGNOSIS — M1990 Unspecified osteoarthritis, unspecified site: Secondary | ICD-10-CM | POA: Diagnosis not present

## 2015-07-04 DIAGNOSIS — I482 Chronic atrial fibrillation: Secondary | ICD-10-CM | POA: Diagnosis not present

## 2015-07-04 DIAGNOSIS — E119 Type 2 diabetes mellitus without complications: Secondary | ICD-10-CM | POA: Diagnosis not present

## 2015-07-04 DIAGNOSIS — E785 Hyperlipidemia, unspecified: Secondary | ICD-10-CM | POA: Diagnosis not present

## 2015-07-05 DIAGNOSIS — Z9981 Dependence on supplemental oxygen: Secondary | ICD-10-CM | POA: Diagnosis not present

## 2015-07-05 DIAGNOSIS — I482 Chronic atrial fibrillation: Secondary | ICD-10-CM | POA: Diagnosis not present

## 2015-07-05 DIAGNOSIS — I10 Essential (primary) hypertension: Secondary | ICD-10-CM | POA: Diagnosis not present

## 2015-07-05 DIAGNOSIS — I214 Non-ST elevation (NSTEMI) myocardial infarction: Secondary | ICD-10-CM | POA: Diagnosis not present

## 2015-07-05 DIAGNOSIS — I2510 Atherosclerotic heart disease of native coronary artery without angina pectoris: Secondary | ICD-10-CM | POA: Diagnosis not present

## 2015-07-05 DIAGNOSIS — M1990 Unspecified osteoarthritis, unspecified site: Secondary | ICD-10-CM | POA: Diagnosis not present

## 2015-07-05 DIAGNOSIS — E119 Type 2 diabetes mellitus without complications: Secondary | ICD-10-CM | POA: Diagnosis not present

## 2015-07-05 DIAGNOSIS — I5040 Unspecified combined systolic (congestive) and diastolic (congestive) heart failure: Secondary | ICD-10-CM | POA: Diagnosis not present

## 2015-07-05 DIAGNOSIS — E785 Hyperlipidemia, unspecified: Secondary | ICD-10-CM | POA: Diagnosis not present

## 2015-07-06 ENCOUNTER — Ambulatory Visit: Payer: Commercial Managed Care - HMO | Admitting: Internal Medicine

## 2015-07-10 DIAGNOSIS — Z9981 Dependence on supplemental oxygen: Secondary | ICD-10-CM | POA: Diagnosis not present

## 2015-07-10 DIAGNOSIS — I214 Non-ST elevation (NSTEMI) myocardial infarction: Secondary | ICD-10-CM | POA: Diagnosis not present

## 2015-07-10 DIAGNOSIS — M1990 Unspecified osteoarthritis, unspecified site: Secondary | ICD-10-CM | POA: Diagnosis not present

## 2015-07-10 DIAGNOSIS — E119 Type 2 diabetes mellitus without complications: Secondary | ICD-10-CM | POA: Diagnosis not present

## 2015-07-10 DIAGNOSIS — I482 Chronic atrial fibrillation: Secondary | ICD-10-CM | POA: Diagnosis not present

## 2015-07-10 DIAGNOSIS — I2510 Atherosclerotic heart disease of native coronary artery without angina pectoris: Secondary | ICD-10-CM | POA: Diagnosis not present

## 2015-07-10 DIAGNOSIS — I5040 Unspecified combined systolic (congestive) and diastolic (congestive) heart failure: Secondary | ICD-10-CM | POA: Diagnosis not present

## 2015-07-10 DIAGNOSIS — E785 Hyperlipidemia, unspecified: Secondary | ICD-10-CM | POA: Diagnosis not present

## 2015-07-10 DIAGNOSIS — I10 Essential (primary) hypertension: Secondary | ICD-10-CM | POA: Diagnosis not present

## 2015-07-11 DIAGNOSIS — I10 Essential (primary) hypertension: Secondary | ICD-10-CM | POA: Diagnosis not present

## 2015-07-11 DIAGNOSIS — E119 Type 2 diabetes mellitus without complications: Secondary | ICD-10-CM | POA: Diagnosis not present

## 2015-07-11 DIAGNOSIS — E785 Hyperlipidemia, unspecified: Secondary | ICD-10-CM | POA: Diagnosis not present

## 2015-07-11 DIAGNOSIS — I2510 Atherosclerotic heart disease of native coronary artery without angina pectoris: Secondary | ICD-10-CM | POA: Diagnosis not present

## 2015-07-11 DIAGNOSIS — I482 Chronic atrial fibrillation: Secondary | ICD-10-CM | POA: Diagnosis not present

## 2015-07-11 DIAGNOSIS — I5040 Unspecified combined systolic (congestive) and diastolic (congestive) heart failure: Secondary | ICD-10-CM | POA: Diagnosis not present

## 2015-07-11 DIAGNOSIS — Z9981 Dependence on supplemental oxygen: Secondary | ICD-10-CM | POA: Diagnosis not present

## 2015-07-11 DIAGNOSIS — I214 Non-ST elevation (NSTEMI) myocardial infarction: Secondary | ICD-10-CM | POA: Diagnosis not present

## 2015-07-11 DIAGNOSIS — M1990 Unspecified osteoarthritis, unspecified site: Secondary | ICD-10-CM | POA: Diagnosis not present

## 2015-07-12 DIAGNOSIS — Z9981 Dependence on supplemental oxygen: Secondary | ICD-10-CM | POA: Diagnosis not present

## 2015-07-12 DIAGNOSIS — M1990 Unspecified osteoarthritis, unspecified site: Secondary | ICD-10-CM | POA: Diagnosis not present

## 2015-07-12 DIAGNOSIS — I10 Essential (primary) hypertension: Secondary | ICD-10-CM | POA: Diagnosis not present

## 2015-07-12 DIAGNOSIS — E785 Hyperlipidemia, unspecified: Secondary | ICD-10-CM | POA: Diagnosis not present

## 2015-07-12 DIAGNOSIS — I482 Chronic atrial fibrillation: Secondary | ICD-10-CM | POA: Diagnosis not present

## 2015-07-12 DIAGNOSIS — I2510 Atherosclerotic heart disease of native coronary artery without angina pectoris: Secondary | ICD-10-CM | POA: Diagnosis not present

## 2015-07-12 DIAGNOSIS — E119 Type 2 diabetes mellitus without complications: Secondary | ICD-10-CM | POA: Diagnosis not present

## 2015-07-12 DIAGNOSIS — I214 Non-ST elevation (NSTEMI) myocardial infarction: Secondary | ICD-10-CM | POA: Diagnosis not present

## 2015-07-12 DIAGNOSIS — I5040 Unspecified combined systolic (congestive) and diastolic (congestive) heart failure: Secondary | ICD-10-CM | POA: Diagnosis not present

## 2015-07-13 DIAGNOSIS — I5043 Acute on chronic combined systolic (congestive) and diastolic (congestive) heart failure: Secondary | ICD-10-CM | POA: Diagnosis not present

## 2015-07-13 DIAGNOSIS — K264 Chronic or unspecified duodenal ulcer with hemorrhage: Secondary | ICD-10-CM | POA: Diagnosis not present

## 2015-07-13 DIAGNOSIS — I10 Essential (primary) hypertension: Secondary | ICD-10-CM | POA: Diagnosis not present

## 2015-07-13 DIAGNOSIS — I4891 Unspecified atrial fibrillation: Secondary | ICD-10-CM | POA: Diagnosis not present

## 2015-07-13 DIAGNOSIS — K922 Gastrointestinal hemorrhage, unspecified: Secondary | ICD-10-CM | POA: Diagnosis not present

## 2015-07-16 ENCOUNTER — Telehealth: Payer: Self-pay | Admitting: Internal Medicine

## 2015-07-16 NOTE — Telephone Encounter (Signed)
Per MW- ONO on 3lpm was normal No changes needed, cont 3 lpm with sleep  LMTCB

## 2015-07-17 NOTE — Telephone Encounter (Signed)
Spoke with pt's granddaughter Truman Hayward, aware of recs.  Nothing further needed at this time.

## 2015-07-17 NOTE — Telephone Encounter (Signed)
Granddaughter is returning call for patient.  She can be reached at [phone].

## 2015-07-23 ENCOUNTER — Encounter: Payer: Self-pay | Admitting: Internal Medicine

## 2015-07-24 ENCOUNTER — Encounter: Payer: Self-pay | Admitting: Internal Medicine

## 2015-07-24 ENCOUNTER — Ambulatory Visit (INDEPENDENT_AMBULATORY_CARE_PROVIDER_SITE_OTHER): Payer: Commercial Managed Care - HMO | Admitting: Internal Medicine

## 2015-07-24 VITALS — BP 126/72 | HR 104 | Wt 265.0 lb

## 2015-07-24 DIAGNOSIS — I270 Primary pulmonary hypertension: Secondary | ICD-10-CM | POA: Diagnosis not present

## 2015-07-24 DIAGNOSIS — J9612 Chronic respiratory failure with hypercapnia: Secondary | ICD-10-CM | POA: Diagnosis not present

## 2015-07-24 DIAGNOSIS — I272 Pulmonary hypertension, unspecified: Secondary | ICD-10-CM

## 2015-07-24 DIAGNOSIS — I340 Nonrheumatic mitral (valve) insufficiency: Secondary | ICD-10-CM | POA: Diagnosis not present

## 2015-07-24 MED ORDER — VALSARTAN 80 MG PO TABS: 80.0000 mg | ORAL_TABLET | Freq: Every day | ORAL | Status: DC

## 2015-07-24 NOTE — Progress Notes (Signed)
Subjective:     Patient ID: [name], female   DOB: 1926/02/16,     MRN: [id number]   Brief patient profile:  81 yobf never smoker with morbid obesity complicated by aodm/ hbp/afib on chronic coumadin  and OHS s/ p mulitple admits referred by [name] for worsening sats.  DATE OF ADMISSION: 05/19/2015 DATE OF DISCHARGE: 05/21/2015 DISCHARGE DIAGNOSES: 1. Status post fall/dislocation left shoulder status post  hypoglycemia. 2. Chronic atrial fibrillation. 3. Status post digoxin toxicity. 4. Compensated congestive heart failure secondary to preserved LV  systolic function. 5. Status post recent probable very small non-Q-wave myocardial  infarction with atypical presentation, status post left cath, noted  to have mild coronary artery disease. 6. Hypertension. 7. Diabetes mellitus, controlled by diet. 8. Hypercholesteremia. 9. History of DVT in the past. 10.History of cancer of breast. 11.Degenerative joint disease. 12.Status post left cataract surgery, recently. 13.History of upper gastrointestinal bleed in the past.  MEDICATIONS: Discharge home medications are; 1. Metoprolol tartrate 25 mg half tablet twice daily. 2. Atorvastatin 20 mg daily. 3. Lasix 20 mg daily. 4. Gabapentin 300 mg every morning. 5. Acular ophthalmic solution as before. 6. Ofloxacin ophthalmic solution as before. 7. Protonix 40 mg daily. 8. Prednisolone acetate 1% ophthalmic suspension as before. 9. Warfarin 1 tablet 5 days a week and 2 mg 1 tablet 5 days a week and  2 tablets on Thursday and Saturday.  The patient has been advised to stop taking carvedilol, digoxin, glimepiride, and potassium chloride.  DIET: Low salt, low cholesterol 1800 calories ADA diet.  Monitor blood sugar and blood pressure daily and chart.  CONDITION AT DISCHARGE: Stable.  BRIEF HISTORY AND HOSPITAL COURSE: [name] is an 81 year old female with past medical history significant for  multiple medical problems, i.e., hypertension, diabetes mellitus, chronic atrial fibrillation, history of congestive heart failure secondary to preserved LV systolic function, history of DVT, history of CA of breast, history of degenerative joint disease, hypercholesteremia, history of syncope in the past, recently discharged from the hospital a few days ago, following probable very small non-Q-wave myocardial infarction requiring cardiac catheterization which showed mild coronary artery disease. She came to the ED and was found by her family lying on the floor when EMS arrived. Apparently, the patient's CBG was only 17 for which she was given oral glucose and D50. There was no efferent respiratory distress. The patient came to consciousness. She is not a good historian, but was lethargic of note. In the ED, the patient was hemodynamically stable. Her CBG on admission was 32, 37, but improved with D50 to 120. CT of the brain showed scalp hematoma, otherwise no abnormalities. Left shoulder x-ray showed anterior-inferior dislocation of the left humeral head, and this was reduced in the ED. The patient was admitted by triad hospitalist and was transferred to my service upon the patient's request.   LABS: Hemoglobin was 11.9, hematocrit 38.6, white count of 6.8. X-ray of the shoulder showed anterior-inferior dislocation of the left femoral head. Repeat x-ray showed interval reduction of glenohumeral dislocation. Her blood sugar today is 111. Sodium was 136, potassium 4.6, BUN 9, creatinine 0.89. Her PT/INR today; PT is 23, INR 2.05.  BRIEF HOSPITAL COURSE: The patient was admitted by triad hospitalist. Her Amaryl was discontinued and continued on home medications. The patient had a brief episode of AFib with RVR on the monitor. She was started on p.o. low-dose Lopressor and subsequently had a pause up to 2.1 second. She was asymptomatic. Her digoxin level  was  mildly elevated to 2.3, digoxin has been discontinue. The patient otherwise remains in AFib with controlled ventricular response. The patient is eager to go home and will be discharged home on above medications and will be followed up in my office in 1 week and PMD as scheduled.    06/08/2015 1st [facility] office visit/ [name]   Chief Complaint  Patient presents with  . Pulmonary Consult    Referred by Dr. Jefferson Dominguez. Pt states o2 sats have been decreased for the past month and has been having "blackouts" with exertion. She gets out of breath just walking from room to room. Before this started she was exercising daily without any problems.   very sedentary/ riding cart at [facility] x years/ only good activity tolerance prior to recent admits was sitting on bike  then aburptly after eye surgery woke up sluggish that same day fell off toilet > admitted x 2 with hypoglycemia on admit and LHC done was ok  Except for 3+ MR / RHC not done/ at home doe/seeing spots after walking 10 ft Since discharge increasing fluid in legs just increased lasix 06/04/15 Now has trouble lying down and props way up  Now on 02 and wasn't prior to admit and fm not sure what settings should be they vary it from 3-4 liters but poor insight into 02 with exertion vs sitting rec Change 02 to 3lpm 24/7 and 4 lpm with activity  We will be ordering an overnight study to see if the 3lpm is adequate  Please remember to go to the lab and x-ray department downstairs for your tests - we will call you with the results when they are available.    79/16/2016 extended summary f/u ov/[name] re: OHS/ PH with severe MR  Chief Complaint  Patient presents with  . Follow-up    Pt c/o increased SOB, especially at night time. Pt also states productive cough with PND.    cough is also noct, mucoid/ grad worse as legs swelled more x 2 weeks, not clear who is directing her diuretic rx at this point/ pt confused easily with instructions No  obvious day to day or daytime variabilty or assoc  cp or chest tightness, subjective wheeze overt sinus or hb symptoms. No unusual exp hx or h/o childhood pna/ asthma or knowledge of premature birth.  Sleeping ok propped up 30 degrees on 02 without nocturnal  or early am exacerbation  of respiratory  c/o's or am HA or ams. Also denies any obvious fluctuation of symptoms with weather or environmental changes or other aggravating or alleviating factors except as outlined above   Current Medications, Allergies, Complete Past Medical History, Past Surgical History, Family History, and Social History were reviewed in [employer] record.  ROS  The following are not active complaints unless bolded sore throat, dysphagia, dental problems, itching, sneezing,  nasal congestion or excess/ purulent secretions, ear ache,   fever, chills, sweats, unintended wt loss, pleuritic or exertional cp, hemoptysis,  orthopnea pnd or leg swelling, presyncope, palpitations, abdominal pain, anorexia, nausea, vomiting, diarrhea  or change in bowel or urinary habits, change in stools or urine, dysuria,hematuria,  rash, arthralgias, visual complaints, headache, numbness weakness or ataxia or problems with walking or coordination,  change in mood/affect or memory.             Objective:   Physical Exam  obese bf nad in w/c   07/24/2015       265  Wt  Readings from Last 3 Encounters:  06/08/15 240 lb (108.863 kg)  05/21/15 219 lb 11.2 oz (99.655 kg)  05/13/15 218 lb 14.7 oz (99.3 kg)    Vital signs reviewed    HEENT: nl dentition, turbinates, and orophanx. Nl external ear canals without cough reflex   NECK :  without JVD/Nodes/TM/ nl carotid upstrokes bilaterally   LUNGS: no acc muscle use, clear to A and P bilaterally without cough on insp or exp maneuvers   CV:  IRIR   no s3  distant HSM or increase in P2, massive edema both lower ext   ABD:  soft and nontender with nl excursion in the  supine position. No bruits or organomegaly, bowel sounds nl  MS:  warm without deformities, calf tenderness, cyanosis or clubbing  SKIN: warm and dry without lesions    NEURO:  alert, approp, no deficits    I personally reviewed images and agree with radiology impression as follows:  CXR  06/08/15 Congestive heart failure with moderately severe pulmonary edema.  Labs ordered/ reviewed:   Lab 06/08/15 1619  NA 138  K 3.7  CL 93*  CO2 39*  BUN 13  CREATININE 0.93  GLUCOSE 119*   Lab 06/08/15 1619  HGB 10.6*  HCT 34.0*  WBC 6.5  PLT 154.0     Lab Results  Component Value Date   TSH 0.822 05/09/2015     Lab Results  Component Value Date   PROBNP 370.0* 06/08/2015        Assessment:

## 2015-07-24 NOTE — Patient Instructions (Addendum)
Please see patient coordinator before you leave today  to schedule an evaluation by Dr Dr Aundra Dubin for Right heart catherization   Add valsartan 80 mg daily and you will need your kidney function blood and potassium checked in two weeks by your primary doctor -  T Starks 07/31/15

## 2015-07-25 ENCOUNTER — Encounter: Payer: Self-pay | Admitting: Internal Medicine

## 2015-07-25 NOTE — Assessment & Plan Note (Signed)
Body mass index is 45.46  Trending up ? Some fluid related   Lab Results  Component Value Date   TSH 0.822 05/09/2015     Contributing to gerd tendency/ doe/reviewed need  achieve and maintain neg calorie balance > defer f/u primary care including intermittently monitoring thyroid status

## 2015-07-25 NOTE — Assessment & Plan Note (Addendum)
See MR a/p> I suspect she predominantly has MR related PH, not PAH , but needs RHC to sort out    In meantime needs optimal diuresis and 02 rs   Discussed in detail all the  indications, usual  risks and alternatives  relative to the benefits with patient and daughter  X 25 min / 40 min ov  rec afterload reduction with ARB, f/u w/in 2 weeks by primary for recheck bmet/ refer to Dr Aundra Dubin

## 2015-07-31 DIAGNOSIS — E1121 Type 2 diabetes mellitus with diabetic nephropathy: Secondary | ICD-10-CM | POA: Diagnosis not present

## 2015-07-31 DIAGNOSIS — I1310 Hypertensive heart and chronic kidney disease without heart failure, with stage 1 through stage 4 chronic kidney disease, or unspecified chronic kidney disease: Secondary | ICD-10-CM | POA: Diagnosis not present

## 2015-07-31 DIAGNOSIS — I272 Other secondary pulmonary hypertension: Secondary | ICD-10-CM | POA: Diagnosis not present

## 2015-07-31 DIAGNOSIS — N183 Chronic kidney disease, stage 3 (moderate): Secondary | ICD-10-CM | POA: Diagnosis not present

## 2015-07-31 DIAGNOSIS — Z79899 Other long term (current) drug therapy: Secondary | ICD-10-CM | POA: Diagnosis not present

## 2015-07-31 DIAGNOSIS — I340 Nonrheumatic mitral (valve) insufficiency: Secondary | ICD-10-CM | POA: Diagnosis not present

## 2015-08-02 DIAGNOSIS — I4891 Unspecified atrial fibrillation: Secondary | ICD-10-CM | POA: Diagnosis not present

## 2015-08-02 DIAGNOSIS — K922 Gastrointestinal hemorrhage, unspecified: Secondary | ICD-10-CM | POA: Diagnosis not present

## 2015-08-02 DIAGNOSIS — I10 Essential (primary) hypertension: Secondary | ICD-10-CM | POA: Diagnosis not present

## 2015-08-02 DIAGNOSIS — K264 Chronic or unspecified duodenal ulcer with hemorrhage: Secondary | ICD-10-CM | POA: Diagnosis not present

## 2015-08-02 DIAGNOSIS — I5043 Acute on chronic combined systolic (congestive) and diastolic (congestive) heart failure: Secondary | ICD-10-CM | POA: Diagnosis not present

## 2015-08-02 DIAGNOSIS — R55 Syncope and collapse: Secondary | ICD-10-CM | POA: Diagnosis not present

## 2015-08-03 ENCOUNTER — Encounter (HOSPITAL_COMMUNITY): Payer: Self-pay | Admitting: Cardiology

## 2015-08-03 ENCOUNTER — Encounter (HOSPITAL_COMMUNITY): Payer: Self-pay

## 2015-08-03 ENCOUNTER — Ambulatory Visit (HOSPITAL_COMMUNITY)
Admission: RE | Admit: 2015-08-03 | Discharge: 2015-08-03 | Disposition: A | Discharge status: Home / Self Care | Payer: Commercial Managed Care - HMO | Source: Ambulatory Visit | Attending: Internal Medicine | Admitting: Internal Medicine

## 2015-08-03 DIAGNOSIS — Z86718 Personal history of other venous thrombosis and embolism: Secondary | ICD-10-CM | POA: Diagnosis not present

## 2015-08-03 DIAGNOSIS — E119 Type 2 diabetes mellitus without complications: Secondary | ICD-10-CM | POA: Diagnosis not present

## 2015-08-03 DIAGNOSIS — Z79899 Other long term (current) drug therapy: Secondary | ICD-10-CM | POA: Diagnosis not present

## 2015-08-03 DIAGNOSIS — Z9981 Dependence on supplemental oxygen: Secondary | ICD-10-CM | POA: Diagnosis not present

## 2015-08-03 DIAGNOSIS — I340 Nonrheumatic mitral (valve) insufficiency: Secondary | ICD-10-CM | POA: Diagnosis not present

## 2015-08-03 DIAGNOSIS — E785 Hyperlipidemia, unspecified: Secondary | ICD-10-CM | POA: Insufficient documentation

## 2015-08-03 DIAGNOSIS — Z7901 Long term (current) use of anticoagulants: Secondary | ICD-10-CM | POA: Insufficient documentation

## 2015-08-03 DIAGNOSIS — I272 Other secondary pulmonary hypertension: Secondary | ICD-10-CM | POA: Insufficient documentation

## 2015-08-03 DIAGNOSIS — Z993 Dependence on wheelchair: Secondary | ICD-10-CM | POA: Insufficient documentation

## 2015-08-03 DIAGNOSIS — I270 Primary pulmonary hypertension: Secondary | ICD-10-CM | POA: Diagnosis not present

## 2015-08-03 DIAGNOSIS — I5032 Chronic diastolic (congestive) heart failure: Secondary | ICD-10-CM | POA: Diagnosis not present

## 2015-08-03 DIAGNOSIS — I482 Chronic atrial fibrillation: Secondary | ICD-10-CM | POA: Diagnosis not present

## 2015-08-03 DIAGNOSIS — I5041 Acute combined systolic (congestive) and diastolic (congestive) heart failure: Secondary | ICD-10-CM | POA: Diagnosis not present

## 2015-08-03 DIAGNOSIS — I10 Essential (primary) hypertension: Secondary | ICD-10-CM | POA: Insufficient documentation

## 2015-08-03 LAB — BASIC METABOLIC PANEL
ANION GAP: 10 (ref 5–15)
BUN: 16 mg/dL (ref 6–20)
CALCIUM: 8.5 mg/dL — AB (ref 8.9–10.3)
CO2: 38 mmol/L — ABNORMAL HIGH (ref 22–32)
Chloride: 90 mmol/L — ABNORMAL LOW (ref 101–111)
Creatinine, Ser: 1.19 mg/dL — ABNORMAL HIGH (ref 0.44–1.00)
GFR calc Af Amer: 47 mL/min — ABNORMAL LOW (ref >60)
GFR calc non Af Amer: 40 mL/min — ABNORMAL LOW (ref >60)
Glucose, Bld: 159 mg/dL — ABNORMAL HIGH (ref 65–99)
Potassium: 5.2 mmol/L — ABNORMAL HIGH (ref 3.5–5.1)
Sodium: 138 mmol/L (ref 135–145)

## 2015-08-03 LAB — CBC
HCT: 27.8 % — ABNORMAL LOW (ref 36.0–46.0)
Hemoglobin: 8.4 g/dL — ABNORMAL LOW (ref 12.0–15.0)
MCH: 30.7 pg (ref 26.0–34.0)
MCHC: 30.2 g/dL (ref 30.0–36.0)
MCV: 101.5 fL — ABNORMAL HIGH (ref 78.0–100.0)
PLATELETS: 125 10*3/uL — AB (ref 150–400)
RBC: 2.74 MIL/uL — ABNORMAL LOW (ref 3.87–5.11)
RDW: 14.3 % (ref 11.5–15.5)
WBC: 6 10*3/uL (ref 4.0–10.5)

## 2015-08-03 LAB — BRAIN NATRIURETIC PEPTIDE: B Natriuretic Peptide: 570.3 pg/mL — ABNORMAL HIGH (ref 0.0–100.0)

## 2015-08-03 MED ORDER — POTASSIUM CHLORIDE CRYS ER 20 MEQ PO TBCR
40.0000 meq | EXTENDED_RELEASE_TABLET | Freq: Once | ORAL | Status: DC
  Filled 2015-08-03: qty 2

## 2015-08-03 MED ORDER — FUROSEMIDE 10 MG/ML IJ SOLN
80.0000 mg | Freq: Once | INTRAMUSCULAR | Status: DC
  Filled 2015-08-03: qty 8

## 2015-08-03 MED ORDER — POTASSIUM CHLORIDE CRYS ER 20 MEQ PO TBCR: 20.0000 meq | EXTENDED_RELEASE_TABLET | Freq: Two times a day (BID) | ORAL | Status: DC

## 2015-08-03 MED ORDER — AMLODIPINE BESYLATE 5 MG PO TABS: 5.0000 mg | ORAL_TABLET | Freq: Every day | ORAL | Status: DC

## 2015-08-03 MED ORDER — FUROSEMIDE 80 MG PO TABS: 80.0000 mg | ORAL_TABLET | Freq: Two times a day (BID) | ORAL | Status: DC

## 2015-08-03 NOTE — Progress Notes (Signed)
Advanced Heart Failure Medication Review by a Pharmacist  Does the patient  feel that his/her medications are working for him/her?  yes  Has the patient been experiencing any side effects to the medications prescribed?  no  Does the patient measure his/her own blood pressure or blood glucose at home?  no   Does the patient have any problems obtaining medications due to transportation or finances?   no  Understanding of regimen: good Understanding of indications: good Potential of compliance: good    Pharmacist comments:  [name] is a pleasant 81 yo F presenting with her daughter who primarily manages her medications. All of her medications were reconciled and she did not have any specific questions or concerns at this time.   [name]. [name], PharmD, BCPS, CPP Clinical Pharmacist Pager: [phone] Phone: [phone] 08/03/2015 12:33 PM

## 2015-08-03 NOTE — Progress Notes (Signed)
Patient seen in clinic today for routine follow up. Volume overloaded, per [facility] PIV 22g started in [place]. 80mg  IV lasix and 40 meq PO potassium given. Total Urinary Output: 1,000cc clear yellow urine PIV dc'd before patient left appointment.  [name]

## 2015-08-03 NOTE — Patient Instructions (Signed)
INCREASE Lasix to 80mg  twice daily.  START Potassium 20 meq twice daily.  START Amlodipine 5mg  once daily.  Routine lab work today. Will notify you of abnormal results, otherwise no news is good news!  Follow up 2-3 weeks.  Do the following things EVERYDAY: 1) Weigh yourself in the morning before breakfast. Write it down and keep it in a log. 2) Take your medicines as prescribed 3) Eat low salt foods-Limit salt (sodium) to 2000 mg per day.  4) Stay as active as you can everyday 5) Limit all fluids for the day to less than 2 liters

## 2015-08-05 NOTE — Progress Notes (Signed)
Patient ID: [name], female   DOB: 09-07-1926, 81 y.o.   MRN: [id number] PCP: Dr. Doy Hutching Referring: Dr. Melvyn Novas  81 yo with chronic atrial fibrillation on coumadin, chronic diastolic CHF, and OHS presents for cardiology evaluation.  She has had several recent admissions.  In 6/16, she had syncope while sitting on the toilet.  She came to the ER.  Troponin was 0.21 and temperature was up to 101.  She was admitted and ended up having cardiac cath showing no obstructive CAD but 3+ MR.  Echo showed normal EF with grade III diastolic dysfunction, severe pulmonary hypertension, and per report only trivial MR.  She was re-admitted later in 6/16 after a fall and syncope again.  She was found to be profoundly hypoglycemic.  She is no longer on diabetes medications.    She was referred to Dr Melvyn Novas and has been on home oxygen for OHS.  She has been wheelchair-bound for the most part since 6/16.  She used to live alone prior to 6/16 but now lives with her daughter.  The only walking she does now if from her bed to the bathroom.  She is dyspneic with any exertion.  This is stable.  She has orthopnea and sleeps almost sitting up.  No chest pain.  BP has been fluctuating a lot and is very high today.  She has been wearing compression stockings.  She thinks that she has gained > 50 lbs since 6/16.  She has been coughing.    ECG (6/16): atrial fibrillation at 67  Labs (7/16): K 3.7, creatinine 0.93, BNP 370  PMH: 1. HTN 2. Type II diabetes: Currently on no meds.  3. Syncope 4. Chronic atrial fibrillation: No history of CVA.  5. Chronic diastolic CHF: Echo (2/95) with EF 55-60%, grade III diastolic dysfunction, PA systolic pressure 76 mmHg, trivial MR.  6. Cardiolite (2/16) with EF 48%, no ischemia.  LHC (6/16) with 20% distal LAD stenosis, EF 55-60%, 3+ MR.  7. Mitral regurgitation: Unclear how significant this is.  Trivial per 6/16 echo but 3+ per 6/16 cath.  8. H/o DVT 9. DCIS s/p lumpectomy and radiation  in 2011.   10. OA 11. Hyperlipidemia 12. OHS: On home oxygen.  13. Morbid obesity.   SH: Lives in [place] with family, never smoked.   FH: No premature CAD. +CHF.   ROS: All systems reviewed and negative except as per HPI.   Current Outpatient Prescriptions  Medication Sig Dispense Refill  . acetaminophen (TYLENOL) 500 MG tablet Take 500 mg by mouth every 6 (six) hours as needed.    Marland Kitchen atorvastatin (LIPITOR) 20 MG tablet Take 20 mg by mouth daily.    . furosemide (LASIX) 80 MG tablet Take 1 tablet (80 mg total) by mouth 2 (two) times daily. 60 tablet 6  . gabapentin (NEURONTIN) 300 MG capsule Take 300 mg by mouth every morning.   3  . metoprolol tartrate (LOPRESSOR) 25 MG tablet Take 0.5 tablets (12.5 mg total) by mouth 2 (two) times daily. 30 tablet 3  . pantoprazole (PROTONIX) 40 MG tablet Take 1 tablet (40 mg total) by mouth 2 (two) times daily before a meal. 60 tablet 3  . valsartan (DIOVAN) 80 MG tablet Take 1 tablet (80 mg total) by mouth daily. 30 tablet 11  . warfarin (COUMADIN) 2 MG tablet Take 2-4 mg by mouth daily. Takes 1 tablet daily, except takes 2 tablets on Thursdays and and Saturdays.    Marland Kitchen amLODipine (NORVASC) 5 MG  tablet Take 1 tablet (5 mg total) by mouth daily. 30 tablet 6  . ketorolac (ACULAR) 0.5 % ophthalmic solution Place 1 drop into the left eye daily.   1  . ofloxacin (OCUFLOX) 0.3 % ophthalmic solution Place 1 drop into the left eye daily.   1  . potassium chloride SA (KLOR-CON M20) 20 MEQ tablet Take 1 tablet (20 mEq total) by mouth 2 (two) times daily. 60 tablet 6  . prednisoLONE acetate (PRED FORTE) 1 % ophthalmic suspension Place 1 drop into the left eye daily.      No current facility-administered medications for this encounter.   BP 186/104, HR 76 General: NAD, obese Neck: JVP 16 cm, no thyromegaly or thyroid nodule.  Lungs: Crackles at bases bilaterally CV: Nondisplaced PMI.  Heart regular S1/S2, no S3/S4, 3/6 HSM at apex.  2+ edema to thighs.  No  carotid bruit.  Normal pedal pulses.  Abdomen: Soft, nontender, no hepatosplenomegaly, no distention.  Skin: Intact without lesions or rashes.  Neurologic: Alert and oriented x 3.  Psych: Normal affect. Extremities: No clubbing or cyanosis.  HEENT: Normal.   Assessment/Plan: 1. Chronic diastolic CHF: [name] is markedly overloaded on exam today.  She has NYHA class IIIb symptoms.  Symptoms have been stable and she is comfortable at rest.  - Increase Lasix to 80 mg bid and increase KCl to 20 bid.   - BMET/BNP today.  - I am going to arrange for RHC within the next week.  This will allow assessment of right and left heart filling pressures as well as PA pressure.  I suspect that she will end up needing to be admitted for IV diuresis.  Hold warfarin 3 days prior to cath day.  2. HTN: BP very high today.  She is on valsartan.  As I will be diuresing her more aggressively, I will not increase valsartan but will instead add amlodipine 5 mg daily.  3. Pulmonary hypertension: Most likely, this is WHO Group 2 pulmonary hypertension (pulmonary venous hypertension) from LV failure (diastolic dysfunction +/- significant MR).  However, there is likely also a Group 3 component from OHS.  Will assess with RHC, as above.  4. Mitral regurgitation: Significant murmur on exam.  Trivial by prior echo report, 3+ by cath.  We could assess by TEE, but think I will start with RHC and treatment of CHF as above.  Not sure that she would have good repair options if we were to document severe MR (age, size).  5. Atrial fibrillation: Chronic.  Continue rate control/anticoagulation strategy. She is only on low dose metoprolol.  Will check CBC given warfarin use.   Loralie Champagne 08/05/2015

## 2015-08-06 ENCOUNTER — Telehealth (HOSPITAL_COMMUNITY): Payer: Self-pay | Admitting: Cardiology

## 2015-08-06 NOTE — Telephone Encounter (Signed)
Patient scheduled for RHC on 08/09/2015 with Dr.Mclean Cpt code 93458 With patients insurance-Medicare [employer]

## 2015-08-08 NOTE — Telephone Encounter (Signed)
Although Medicare does not require pre cert [employer] pre cert auth # [id number]

## 2015-08-09 ENCOUNTER — Ambulatory Visit (HOSPITAL_COMMUNITY): Payer: Medicare HMO

## 2015-08-09 ENCOUNTER — Encounter (HOSPITAL_COMMUNITY)
Admission: RE | Disposition: A | Discharge status: Home Health | Payer: Self-pay | Source: Ambulatory Visit | Attending: Cardiology

## 2015-08-09 ENCOUNTER — Encounter (HOSPITAL_COMMUNITY): Payer: Self-pay | Admitting: Cardiology

## 2015-08-09 ENCOUNTER — Inpatient Hospital Stay (HOSPITAL_COMMUNITY)
Admission: RE | Admit: 2015-08-09 | Discharge: 2015-08-17 | DRG: 287 | Disposition: A | Discharge status: Home Health | Payer: Medicare HMO | Source: Ambulatory Visit | Attending: Cardiology | Admitting: Cardiology

## 2015-08-09 DIAGNOSIS — I270 Primary pulmonary hypertension: Secondary | ICD-10-CM | POA: Diagnosis not present

## 2015-08-09 DIAGNOSIS — Z993 Dependence on wheelchair: Secondary | ICD-10-CM

## 2015-08-09 DIAGNOSIS — I482 Chronic atrial fibrillation, unspecified: Secondary | ICD-10-CM

## 2015-08-09 DIAGNOSIS — I10 Essential (primary) hypertension: Secondary | ICD-10-CM | POA: Diagnosis not present

## 2015-08-09 DIAGNOSIS — I272 Other secondary pulmonary hypertension: Secondary | ICD-10-CM | POA: Diagnosis present

## 2015-08-09 DIAGNOSIS — Z7901 Long term (current) use of anticoagulants: Secondary | ICD-10-CM

## 2015-08-09 DIAGNOSIS — Z853 Personal history of malignant neoplasm of breast: Secondary | ICD-10-CM | POA: Diagnosis not present

## 2015-08-09 DIAGNOSIS — Z86718 Personal history of other venous thrombosis and embolism: Secondary | ICD-10-CM

## 2015-08-09 DIAGNOSIS — Z6841 Body Mass Index (BMI) 40.0 and over, adult: Secondary | ICD-10-CM

## 2015-08-09 DIAGNOSIS — K922 Gastrointestinal hemorrhage, unspecified: Secondary | ICD-10-CM | POA: Diagnosis not present

## 2015-08-09 DIAGNOSIS — D649 Anemia, unspecified: Secondary | ICD-10-CM | POA: Diagnosis present

## 2015-08-09 DIAGNOSIS — K219 Gastro-esophageal reflux disease without esophagitis: Secondary | ICD-10-CM | POA: Diagnosis present

## 2015-08-09 DIAGNOSIS — E119 Type 2 diabetes mellitus without complications: Secondary | ICD-10-CM | POA: Diagnosis present

## 2015-08-09 DIAGNOSIS — K264 Chronic or unspecified duodenal ulcer with hemorrhage: Secondary | ICD-10-CM | POA: Diagnosis not present

## 2015-08-09 DIAGNOSIS — I509 Heart failure, unspecified: Secondary | ICD-10-CM | POA: Diagnosis not present

## 2015-08-09 DIAGNOSIS — Z9981 Dependence on supplemental oxygen: Secondary | ICD-10-CM

## 2015-08-09 DIAGNOSIS — I340 Nonrheumatic mitral (valve) insufficiency: Secondary | ICD-10-CM | POA: Diagnosis present

## 2015-08-09 DIAGNOSIS — Z923 Personal history of irradiation: Secondary | ICD-10-CM

## 2015-08-09 DIAGNOSIS — E878 Other disorders of electrolyte and fluid balance, not elsewhere classified: Secondary | ICD-10-CM | POA: Insufficient documentation

## 2015-08-09 DIAGNOSIS — I5033 Acute on chronic diastolic (congestive) heart failure: Secondary | ICD-10-CM

## 2015-08-09 DIAGNOSIS — I5043 Acute on chronic combined systolic (congestive) and diastolic (congestive) heart failure: Secondary | ICD-10-CM | POA: Diagnosis not present

## 2015-08-09 DIAGNOSIS — I110 Hypertensive heart disease with heart failure: Principal | ICD-10-CM | POA: Diagnosis present

## 2015-08-09 DIAGNOSIS — E1142 Type 2 diabetes mellitus with diabetic polyneuropathy: Secondary | ICD-10-CM | POA: Diagnosis present

## 2015-08-09 DIAGNOSIS — I119 Hypertensive heart disease without heart failure: Secondary | ICD-10-CM | POA: Diagnosis present

## 2015-08-09 DIAGNOSIS — R0600 Dyspnea, unspecified: Secondary | ICD-10-CM | POA: Diagnosis present

## 2015-08-09 DIAGNOSIS — E785 Hyperlipidemia, unspecified: Secondary | ICD-10-CM | POA: Diagnosis present

## 2015-08-09 DIAGNOSIS — Z0389 Encounter for observation for other suspected diseases and conditions ruled out: Secondary | ICD-10-CM

## 2015-08-09 DIAGNOSIS — IMO0001 Reserved for inherently not codable concepts without codable children: Secondary | ICD-10-CM

## 2015-08-09 DIAGNOSIS — D0510 Intraductal carcinoma in situ of unspecified breast: Secondary | ICD-10-CM

## 2015-08-09 DIAGNOSIS — R55 Syncope and collapse: Secondary | ICD-10-CM

## 2015-08-09 DIAGNOSIS — I4891 Unspecified atrial fibrillation: Secondary | ICD-10-CM | POA: Diagnosis not present

## 2015-08-09 HISTORY — PX: CARDIAC CATHETERIZATION: SHX172

## 2015-08-09 LAB — POCT I-STAT 3, VENOUS BLOOD GAS (G3P V)
ACID-BASE EXCESS: 18 mmol/L — AB (ref 0.0–2.0)
Acid-Base Excess: 15 mmol/L — ABNORMAL HIGH (ref 0.0–2.0)
Bicarbonate: 43.4 mEq/L — ABNORMAL HIGH (ref 20.0–24.0)
Bicarbonate: 45.7 mEq/L — ABNORMAL HIGH (ref 20.0–24.0)
O2 Saturation: 42 %
O2 Saturation: 49 %
PCO2 VEN: 77 mmHg — AB (ref 45.0–50.0)
PO2 VEN: 26 mmHg — AB (ref 30.0–45.0)
PO2 VEN: 28 mmHg — AB (ref 30.0–45.0)
TCO2: 46 mmol/L (ref 0–100)
TCO2: 48 mmol/L (ref 0–100)
pCO2, Ven: 73.7 mmHg (ref 45.0–50.0)
pH, Ven: 7.359 — ABNORMAL HIGH (ref 7.250–7.300)
pH, Ven: 7.401 — ABNORMAL HIGH (ref 7.250–7.300)

## 2015-08-09 LAB — CBC
HEMATOCRIT: 30.6 % — AB (ref 36.0–46.0)
Hemoglobin: 8.8 g/dL — ABNORMAL LOW (ref 12.0–15.0)
MCH: 29.9 pg (ref 26.0–34.0)
MCHC: 28.8 g/dL — ABNORMAL LOW (ref 30.0–36.0)
MCV: 104.1 fL — AB (ref 78.0–100.0)
Platelets: 131 10*3/uL — ABNORMAL LOW (ref 150–400)
RBC: 2.94 MIL/uL — ABNORMAL LOW (ref 3.87–5.11)
RDW: 14.3 % (ref 11.5–15.5)
WBC: 6.2 10*3/uL (ref 4.0–10.5)

## 2015-08-09 LAB — BASIC METABOLIC PANEL
Anion gap: 6 (ref 5–15)
BUN: 17 mg/dL (ref 6–20)
CHLORIDE: 91 mmol/L — AB (ref 101–111)
CO2: 44 mmol/L — ABNORMAL HIGH (ref 22–32)
Calcium: 8.9 mg/dL (ref 8.9–10.3)
Creatinine, Ser: 1.22 mg/dL — ABNORMAL HIGH (ref 0.44–1.00)
GFR calc Af Amer: 45 mL/min — ABNORMAL LOW (ref >60)
GFR calc non Af Amer: 39 mL/min — ABNORMAL LOW (ref >60)
Glucose, Bld: 141 mg/dL — ABNORMAL HIGH (ref 65–99)
POTASSIUM: 4.9 mmol/L (ref 3.5–5.1)
SODIUM: 141 mmol/L (ref 135–145)

## 2015-08-09 LAB — GLUCOSE, CAPILLARY: Glucose-Capillary: 122 mg/dL — ABNORMAL HIGH (ref 65–99)

## 2015-08-09 LAB — TSH: TSH: 2.198 u[IU]/mL (ref 0.350–4.500)

## 2015-08-09 LAB — PROTIME-INR
INR: 1.74 — ABNORMAL HIGH (ref 0.00–1.49)
Prothrombin Time: 20.3 seconds — ABNORMAL HIGH (ref 11.6–15.2)

## 2015-08-09 LAB — BRAIN NATRIURETIC PEPTIDE: B NATRIURETIC PEPTIDE 5: 561.8 pg/mL — AB (ref 0.0–100.0)

## 2015-08-09 SURGERY — RIGHT HEART CATH
Anesthesia: LOCAL

## 2015-08-09 MED ORDER — SODIUM CHLORIDE 0.9 % IV SOLN
INTRAVENOUS | Status: DC
  Administered 2015-08-09: 08:00:00 via INTRAVENOUS

## 2015-08-09 MED ORDER — SODIUM CHLORIDE 0.9 % IJ SOLN
3.0000 mL | Freq: Two times a day (BID) | INTRAMUSCULAR | Status: DC
  Administered 2015-08-09 – 2015-08-17 (×9): 3 mL via INTRAVENOUS

## 2015-08-09 MED ORDER — SODIUM CHLORIDE 0.9 % IJ SOLN: 3.0000 mL | INTRAMUSCULAR | Status: DC | PRN

## 2015-08-09 MED ORDER — FENTANYL CITRATE (PF) 100 MCG/2ML IJ SOLN
INTRAMUSCULAR | Status: DC | PRN
  Administered 2015-08-09: 25 ug via INTRAVENOUS

## 2015-08-09 MED ORDER — ONDANSETRON HCL 4 MG/2ML IJ SOLN: 4.0000 mg | Freq: Four times a day (QID) | INTRAMUSCULAR | Status: DC | PRN

## 2015-08-09 MED ORDER — LIDOCAINE HCL (PF) 1 % IJ SOLN
INTRAMUSCULAR | Status: AC
  Filled 2015-08-09: qty 30

## 2015-08-09 MED ORDER — MIDAZOLAM HCL 2 MG/2ML IJ SOLN
INTRAMUSCULAR | Status: AC
  Filled 2015-08-09: qty 4

## 2015-08-09 MED ORDER — FUROSEMIDE 10 MG/ML IJ SOLN
15.0000 mg/h | INTRAVENOUS | Status: DC
  Administered 2015-08-09 – 2015-08-12 (×3): 10 mg/h via INTRAVENOUS
  Administered 2015-08-13 – 2015-08-16 (×5): 15 mg/h via INTRAVENOUS
  Filled 2015-08-09 (×18): qty 25

## 2015-08-09 MED ORDER — AMLODIPINE BESYLATE 5 MG PO TABS: 5.0000 mg | ORAL_TABLET | Freq: Every day | ORAL | Status: DC

## 2015-08-09 MED ORDER — POTASSIUM CHLORIDE CRYS ER 20 MEQ PO TBCR
40.0000 meq | EXTENDED_RELEASE_TABLET | Freq: Two times a day (BID) | ORAL | Status: DC
  Administered 2015-08-09: 40 meq via ORAL
  Filled 2015-08-09: qty 2

## 2015-08-09 MED ORDER — SODIUM CHLORIDE 0.9 % IV SOLN: 250.0000 mL | INTRAVENOUS | Status: DC | PRN

## 2015-08-09 MED ORDER — METOPROLOL TARTRATE 12.5 MG HALF TABLET
12.5000 mg | ORAL_TABLET | Freq: Two times a day (BID) | ORAL | Status: DC
  Administered 2015-08-09 – 2015-08-16 (×15): 12.5 mg via ORAL
  Filled 2015-08-09 (×16): qty 1

## 2015-08-09 MED ORDER — SODIUM CHLORIDE 0.9 % IV SOLN
250.0000 mL | INTRAVENOUS | Status: DC | PRN
Start: 2015-08-09 — End: 2015-08-09

## 2015-08-09 MED ORDER — LIDOCAINE HCL (PF) 1 % IJ SOLN
INTRAMUSCULAR | Status: DC | PRN
  Administered 2015-08-09: 15 mL

## 2015-08-09 MED ORDER — SODIUM CHLORIDE 0.9 % IJ SOLN
3.0000 mL | Freq: Two times a day (BID) | INTRAMUSCULAR | Status: DC
  Administered 2015-08-09 – 2015-08-17 (×6): 3 mL via INTRAVENOUS

## 2015-08-09 MED ORDER — LIDOCAINE HCL (PF) 1 % IJ SOLN
INTRAMUSCULAR | Status: DC | PRN
  Administered 2015-08-09: 09:00:00

## 2015-08-09 MED ORDER — SODIUM CHLORIDE 0.9 % IJ SOLN: 3.0000 mL | Freq: Two times a day (BID) | INTRAMUSCULAR | Status: DC

## 2015-08-09 MED ORDER — LOSARTAN POTASSIUM 25 MG PO TABS
25.0000 mg | ORAL_TABLET | Freq: Every day | ORAL | Status: DC
  Administered 2015-08-09: 25 mg via ORAL
  Filled 2015-08-09: qty 1

## 2015-08-09 MED ORDER — ACETAMINOPHEN 325 MG PO TABS: 650.0000 mg | ORAL_TABLET | ORAL | Status: DC | PRN

## 2015-08-09 MED ORDER — FENTANYL CITRATE (PF) 100 MCG/2ML IJ SOLN
INTRAMUSCULAR | Status: AC
  Filled 2015-08-09: qty 4

## 2015-08-09 MED ORDER — MIDAZOLAM HCL 2 MG/2ML IJ SOLN
INTRAMUSCULAR | Status: DC | PRN
  Administered 2015-08-09: 0.5 mg via INTRAVENOUS

## 2015-08-09 MED ORDER — WARFARIN SODIUM 4 MG PO TABS
4.0000 mg | ORAL_TABLET | Freq: Once | ORAL | Status: AC
  Administered 2015-08-09: 4 mg via ORAL
  Filled 2015-08-09: qty 1

## 2015-08-09 MED ORDER — ATORVASTATIN CALCIUM 20 MG PO TABS
20.0000 mg | ORAL_TABLET | Freq: Every day | ORAL | Status: DC
  Administered 2015-08-10 – 2015-08-17 (×8): 20 mg via ORAL
  Filled 2015-08-09 (×8): qty 1

## 2015-08-09 MED ORDER — GABAPENTIN 300 MG PO CAPS
300.0000 mg | ORAL_CAPSULE | Freq: Every day | ORAL | Status: DC
  Administered 2015-08-09 – 2015-08-16 (×8): 300 mg via ORAL
  Filled 2015-08-09 (×7): qty 1
  Filled 2015-08-09: qty 3

## 2015-08-09 MED ORDER — WARFARIN - PHARMACIST DOSING INPATIENT
Freq: Every day | Status: DC
  Administered 2015-08-09 – 2015-08-13 (×4)

## 2015-08-09 MED ORDER — SODIUM CHLORIDE 0.9 % IJ SOLN
3.0000 mL | INTRAMUSCULAR | Status: DC | PRN
Start: 2015-08-09 — End: 2015-08-09

## 2015-08-09 MED ORDER — FUROSEMIDE 10 MG/ML IJ SOLN
80.0000 mg | Freq: Once | INTRAMUSCULAR | Status: AC
  Administered 2015-08-09: 80 mg via INTRAVENOUS
  Filled 2015-08-09: qty 8

## 2015-08-09 MED ORDER — PANTOPRAZOLE SODIUM 40 MG PO TBEC
40.0000 mg | DELAYED_RELEASE_TABLET | Freq: Every day | ORAL | Status: DC
  Administered 2015-08-10 – 2015-08-17 (×8): 40 mg via ORAL
  Filled 2015-08-09 (×8): qty 1

## 2015-08-09 MED ORDER — ASPIRIN 81 MG PO CHEW: 81.0000 mg | CHEWABLE_TABLET | ORAL | Status: DC

## 2015-08-09 SURGICAL SUPPLY — 7 items
CATH SWAN GANZ 7F STRAIGHT (CATHETERS) ×2 IMPLANT
GUIDEWIRE .025 260CM (WIRE) ×2 IMPLANT
KIT HEART LEFT (KITS) ×2 IMPLANT
PACK CARDIAC CATHETERIZATION (CUSTOM PROCEDURE TRAY) ×2 IMPLANT
SHEATH PINNACLE 7F 10CM (SHEATH) ×2 IMPLANT
TRANSDUCER W/STOPCOCK (MISCELLANEOUS) ×2 IMPLANT
WIRE EMERALD 3MM-J .035X150CM (WIRE) ×2 IMPLANT

## 2015-08-09 NOTE — Progress Notes (Signed)
  Echocardiogram 2D Echocardiogram has been performed.  [name] 08/09/2015, 2:13 PM

## 2015-08-09 NOTE — H&P (View-Only) (Signed)
Patient ID: [name], female   DOB: 08/26/1926, 81 y.o.   MRN: [id number] PCP: Dr. Sparks Referring: Dr. Wert  81 yo with chronic atrial fibrillation on coumadin, chronic diastolic CHF, and OHS presents for cardiology evaluation.  She has had several recent admissions.  In 6/16, she had syncope while sitting on the toilet.  She came to the ER.  Troponin was 0.21 and temperature was up to 101.  She was admitted and ended up having cardiac cath showing no obstructive CAD but 3+ MR.  Echo showed normal EF with grade III diastolic dysfunction, severe pulmonary hypertension, and per report only trivial MR.  She was re-admitted later in 6/16 after a fall and syncope again.  She was found to be profoundly hypoglycemic.  She is no longer on diabetes medications.    She was referred to Dr Wert and has been on home oxygen for OHS.  She has been wheelchair-bound for the most part since 6/16.  She used to live alone prior to 6/16 but now lives with her daughter.  The only walking she does now if from her bed to the bathroom.  She is dyspneic with any exertion.  This is stable.  She has orthopnea and sleeps almost sitting up.  No chest pain.  BP has been fluctuating a lot and is very high today.  She has been wearing compression stockings.  She thinks that she has gained > 50 lbs since 6/16.  She has been coughing.    ECG (6/16): atrial fibrillation at 67  Labs (7/16): K 3.7, creatinine 0.93, BNP 370  PMH: 1. HTN 2. Type II diabetes: Currently on no meds.  3. Syncope 4. Chronic atrial fibrillation: No history of CVA.  5. Chronic diastolic CHF: Echo (6/16) with EF 55-60%, grade III diastolic dysfunction, PA systolic pressure 76 mmHg, trivial MR.  6. Cardiolite (2/16) with EF 48%, no ischemia.  LHC (6/16) with 20% distal LAD stenosis, EF 55-60%, 3+ MR.  7. Mitral regurgitation: Unclear how significant this is.  Trivial per 6/16 echo but 3+ per 6/16 cath.  8. H/o DVT 9. DCIS s/p lumpectomy and radiation  in 2011.   10. OA 11. Hyperlipidemia 12. OHS: On home oxygen.  13. Morbid obesity.   SH: Lives in [place] with family, never smoked.   FH: No premature CAD. +CHF.   ROS: All systems reviewed and negative except as per HPI.   Current Outpatient Prescriptions  Medication Sig Dispense Refill  . acetaminophen (TYLENOL) 500 MG tablet Take 500 mg by mouth every 6 (six) hours as needed.    . atorvastatin (LIPITOR) 20 MG tablet Take 20 mg by mouth daily.    . furosemide (LASIX) 80 MG tablet Take 1 tablet (80 mg total) by mouth 2 (two) times daily. 60 tablet 6  . gabapentin (NEURONTIN) 300 MG capsule Take 300 mg by mouth every morning.   3  . metoprolol tartrate (LOPRESSOR) 25 MG tablet Take 0.5 tablets (12.5 mg total) by mouth 2 (two) times daily. 30 tablet 3  . pantoprazole (PROTONIX) 40 MG tablet Take 1 tablet (40 mg total) by mouth 2 (two) times daily before a meal. 60 tablet 3  . valsartan (DIOVAN) 80 MG tablet Take 1 tablet (80 mg total) by mouth daily. 30 tablet 11  . warfarin (COUMADIN) 2 MG tablet Take 2-4 mg by mouth daily. Takes 1 tablet daily, except takes 2 tablets on Thursdays and and Saturdays.    . amLODipine (NORVASC) 5 MG   tablet Take 1 tablet (5 mg total) by mouth daily. 30 tablet 6  . ketorolac (ACULAR) 0.5 % ophthalmic solution Place 1 drop into the left eye daily.   1  . ofloxacin (OCUFLOX) 0.3 % ophthalmic solution Place 1 drop into the left eye daily.   1  . potassium chloride SA (KLOR-CON M20) 20 MEQ tablet Take 1 tablet (20 mEq total) by mouth 2 (two) times daily. 60 tablet 6  . prednisoLONE acetate (PRED FORTE) 1 % ophthalmic suspension Place 1 drop into the left eye daily.      No current facility-administered medications for this encounter.   BP 186/104, HR 76 General: NAD, obese Neck: JVP 16 cm, no thyromegaly or thyroid nodule.  Lungs: Crackles at bases bilaterally CV: Nondisplaced PMI.  Heart regular S1/S2, no S3/S4, 3/6 HSM at apex.  2+ edema to thighs.  No  carotid bruit.  Normal pedal pulses.  Abdomen: Soft, nontender, no hepatosplenomegaly, no distention.  Skin: Intact without lesions or rashes.  Neurologic: Alert and oriented x 3.  Psych: Normal affect. Extremities: No clubbing or cyanosis.  HEENT: Normal.   Assessment/Plan: 1. Chronic diastolic CHF: [name] is markedly overloaded on exam today.  She has NYHA class IIIb symptoms.  Symptoms have been stable and she is comfortable at rest.  - Increase Lasix to 80 mg bid and increase KCl to 20 bid.   - BMET/BNP today.  - I am going to arrange for RHC within the next week.  This will allow assessment of right and left heart filling pressures as well as PA pressure.  I suspect that she will end up needing to be admitted for IV diuresis.  Hold warfarin 3 days prior to cath day.  2. HTN: BP very high today.  She is on valsartan.  As I will be diuresing her more aggressively, I will not increase valsartan but will instead add amlodipine 5 mg daily.  3. Pulmonary hypertension: Most likely, this is WHO Group 2 pulmonary hypertension (pulmonary venous hypertension) from LV failure (diastolic dysfunction +/- significant MR).  However, there is likely also a Group 3 component from OHS.  Will assess with RHC, as above.  4. Mitral regurgitation: Significant murmur on exam.  Trivial by prior echo report, 3+ by cath.  We could assess by TEE, but think I will start with RHC and treatment of CHF as above.  Not sure that she would have good repair options if we were to document severe MR (age, size).  5. Atrial fibrillation: Chronic.  Continue rate control/anticoagulation strategy. She is only on low dose metoprolol.  Will check CBC given warfarin use.   [name] 08/05/2015    

## 2015-08-09 NOTE — Interval H&P Note (Signed)
History and Physical Interval Note:  08/09/2015 8:08 AM  [name]  has presented today for surgery, with the diagnosis of chf  The various methods of treatment have been discussed with the patient and family. After consideration of risks, benefits and other options for treatment, the patient has consented to  Procedure(s): Right Heart Cath (N/A) as a surgical intervention .  The patient's history has been reviewed, patient examined, no change in status, stable for surgery.  I have reviewed the patient's chart and labs.  Questions were answered to the patient's satisfaction.     Gladis Soley [employer]

## 2015-08-09 NOTE — Progress Notes (Signed)
ANTICOAGULATION CONSULT NOTE - Initial Consult  Pharmacy Consult for warfarin Indication: atrial fibrillation  Allergies  Allergen Reactions  . Peach [Prunus Persica] Rash    AGENT: peach fuzz    Patient Measurements: Height: 5\' 4"  (162.6 cm) Weight: 279 lb (126.554 kg) IBW/kg (Calculated) : 54.7 Heparin Dosing Weight:   Vital Signs: Temp: 97.4 F (36.3 C) (09/01 1007) Temp Source: Oral (09/01 1007) BP: 121/84 mmHg (09/01 1015) Pulse Rate: 96 (09/01 1006)  Labs:  Recent Labs  08/09/15 0624  HGB 8.8*  HCT 30.6*  PLT 131*  LABPROT 20.3*  INR 1.74*  CREATININE 1.22*    Estimated Creatinine Clearance: 43.6 mL/min (by C-G formula based on Cr of 1.22).   Medical History: Past Medical History  Diagnosis Date  . 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 DVT (deep venous thrombosis)   . Arthritis   . Hyperlipidemia   . Hypertension   . CHF (congestive heart failure)   . Anemia   . Cholelithiasis   . GI bleed   . Breast cancer 2012    right  . Peripheral neuropathy   . Heart murmur   . Syncope 05/09/2015  . Shortness of breath dyspnea   . Diabetes mellitus   . GERD (gastroesophageal reflux disease)     Medications:  Prescriptions prior to admission  Medication Sig Dispense Refill Last Dose  . acetaminophen (TYLENOL) 500 MG tablet Take 1,000 mg by mouth 2 (two) times daily as needed for mild pain.    Taking  . amLODipine (NORVASC) 5 MG tablet Take 1 tablet (5 mg total) by mouth daily. 30 tablet 6 08/09/2015 at 0500  . atorvastatin (LIPITOR) 20 MG tablet Take 20 mg by mouth daily.   08/09/2015 at 0500  . furosemide (LASIX) 20 MG tablet Take 20 mg by mouth daily.   08/08/2015 at Unknown time  . gabapentin (NEURONTIN) 300 MG capsule Take 300 mg by mouth at bedtime.   3 08/08/2015 at Unknown time  . metoprolol tartrate (LOPRESSOR) 25 MG tablet Take 0.5 tablets (12.5 mg total) by mouth 2 (two) times daily. 30  tablet 3 08/09/2015 at 0500  . pantoprazole (PROTONIX) 40 MG tablet Take 1 tablet (40 mg total) by mouth 2 (two) times daily before a meal. (Patient taking differently: Take 40 mg by mouth daily. ) 60 tablet 3 08/09/2015 at 0500  . potassium chloride SA (KLOR-CON M20) 20 MEQ tablet Take 1 tablet (20 mEq total) by mouth 2 (two) times daily. 60 tablet 6 08/09/2015 at 0500  . valsartan (DIOVAN) 80 MG tablet Take 1 tablet (80 mg total) by mouth daily. 30 tablet 11 08/09/2015 at 0500  . warfarin (COUMADIN) 2 MG tablet Take 2-4 mg by mouth daily. Take 2 mg daily except 4 mg on Thursday's and and Saturday's.   08/05/2015    Assessment: 81 yo female on warfarin for AFib. PTA dose: 4 mg Thurs/Sat, 2 mg all other days. Admitted with subtherapeutic INR, 1.7.  Goal of Therapy:  INR 2-3 Monitor platelets by anticoagulation protocol: Yes   Plan:  -Warfarin 4 mg po x1 -Daily INR -Monitor s/sx bleeding   Hughes Better, PharmD, BCPS Clinical Pharmacist Pager: [phone] 08/09/2015 10:35 AM

## 2015-08-09 NOTE — Progress Notes (Signed)
Pt admitted from cath lab today, bedrest was over at 11:45am, site level 0, pt now on lasix gtt @ 10 ml/hr, VSS, pt diuresing well

## 2015-08-10 DIAGNOSIS — I482 Chronic atrial fibrillation: Secondary | ICD-10-CM | POA: Diagnosis present

## 2015-08-10 DIAGNOSIS — I5033 Acute on chronic diastolic (congestive) heart failure: Secondary | ICD-10-CM

## 2015-08-10 DIAGNOSIS — E119 Type 2 diabetes mellitus without complications: Secondary | ICD-10-CM | POA: Diagnosis present

## 2015-08-10 DIAGNOSIS — E1142 Type 2 diabetes mellitus with diabetic polyneuropathy: Secondary | ICD-10-CM | POA: Diagnosis present

## 2015-08-10 DIAGNOSIS — I340 Nonrheumatic mitral (valve) insufficiency: Secondary | ICD-10-CM | POA: Diagnosis present

## 2015-08-10 DIAGNOSIS — Z853 Personal history of malignant neoplasm of breast: Secondary | ICD-10-CM | POA: Diagnosis not present

## 2015-08-10 DIAGNOSIS — Z993 Dependence on wheelchair: Secondary | ICD-10-CM | POA: Diagnosis not present

## 2015-08-10 DIAGNOSIS — Z7901 Long term (current) use of anticoagulants: Secondary | ICD-10-CM | POA: Diagnosis not present

## 2015-08-10 DIAGNOSIS — Z6841 Body Mass Index (BMI) 40.0 and over, adult: Secondary | ICD-10-CM | POA: Diagnosis not present

## 2015-08-10 DIAGNOSIS — D649 Anemia, unspecified: Secondary | ICD-10-CM | POA: Diagnosis present

## 2015-08-10 DIAGNOSIS — R0600 Dyspnea, unspecified: Secondary | ICD-10-CM | POA: Diagnosis present

## 2015-08-10 DIAGNOSIS — I5043 Acute on chronic combined systolic (congestive) and diastolic (congestive) heart failure: Secondary | ICD-10-CM | POA: Diagnosis not present

## 2015-08-10 DIAGNOSIS — E785 Hyperlipidemia, unspecified: Secondary | ICD-10-CM | POA: Diagnosis present

## 2015-08-10 DIAGNOSIS — K264 Chronic or unspecified duodenal ulcer with hemorrhage: Secondary | ICD-10-CM | POA: Diagnosis not present

## 2015-08-10 DIAGNOSIS — E878 Other disorders of electrolyte and fluid balance, not elsewhere classified: Secondary | ICD-10-CM | POA: Diagnosis not present

## 2015-08-10 DIAGNOSIS — K219 Gastro-esophageal reflux disease without esophagitis: Secondary | ICD-10-CM | POA: Diagnosis present

## 2015-08-10 DIAGNOSIS — I10 Essential (primary) hypertension: Secondary | ICD-10-CM | POA: Diagnosis not present

## 2015-08-10 DIAGNOSIS — Z9981 Dependence on supplemental oxygen: Secondary | ICD-10-CM | POA: Diagnosis not present

## 2015-08-10 DIAGNOSIS — I270 Primary pulmonary hypertension: Secondary | ICD-10-CM | POA: Diagnosis not present

## 2015-08-10 DIAGNOSIS — I272 Other secondary pulmonary hypertension: Secondary | ICD-10-CM | POA: Diagnosis present

## 2015-08-10 DIAGNOSIS — K922 Gastrointestinal hemorrhage, unspecified: Secondary | ICD-10-CM | POA: Diagnosis not present

## 2015-08-10 DIAGNOSIS — Z923 Personal history of irradiation: Secondary | ICD-10-CM | POA: Diagnosis not present

## 2015-08-10 DIAGNOSIS — I110 Hypertensive heart disease with heart failure: Secondary | ICD-10-CM | POA: Diagnosis present

## 2015-08-10 DIAGNOSIS — I4891 Unspecified atrial fibrillation: Secondary | ICD-10-CM | POA: Diagnosis not present

## 2015-08-10 DIAGNOSIS — Z86718 Personal history of other venous thrombosis and embolism: Secondary | ICD-10-CM | POA: Diagnosis not present

## 2015-08-10 LAB — BASIC METABOLIC PANEL
ANION GAP: 8 (ref 5–15)
BUN: 14 mg/dL (ref 6–20)
CALCIUM: 8.6 mg/dL — AB (ref 8.9–10.3)
CO2: 41 mmol/L — AB (ref 22–32)
CREATININE: 1.12 mg/dL — AB (ref 0.44–1.00)
Chloride: 89 mmol/L — ABNORMAL LOW (ref 101–111)
GFR, EST AFRICAN AMERICAN: 50 mL/min — AB (ref >60)
GFR, EST NON AFRICAN AMERICAN: 43 mL/min — AB (ref >60)
GLUCOSE: 145 mg/dL — AB (ref 65–99)
Potassium: 5.1 mmol/L (ref 3.5–5.1)
Sodium: 138 mmol/L (ref 135–145)

## 2015-08-10 LAB — CBC
HEMATOCRIT: 31.9 % — AB (ref 36.0–46.0)
Hemoglobin: 9.1 g/dL — ABNORMAL LOW (ref 12.0–15.0)
MCH: 29.4 pg (ref 26.0–34.0)
MCHC: 28.5 g/dL — AB (ref 30.0–36.0)
MCV: 103.2 fL — ABNORMAL HIGH (ref 78.0–100.0)
PLATELETS: 132 10*3/uL — AB (ref 150–400)
RBC: 3.09 MIL/uL — ABNORMAL LOW (ref 3.87–5.11)
RDW: 14.3 % (ref 11.5–15.5)
WBC: 5.4 10*3/uL (ref 4.0–10.5)

## 2015-08-10 LAB — PROTIME-INR
INR: 1.65 — AB (ref 0.00–1.49)
PROTHROMBIN TIME: 19.5 s — AB (ref 11.6–15.2)

## 2015-08-10 LAB — IRON AND TIBC
IRON: 94 ug/dL (ref 28–170)
SATURATION RATIOS: 20 % (ref 10.4–31.8)
TIBC: 466 ug/dL — AB (ref 250–450)
UIBC: 372 ug/dL

## 2015-08-10 MED ORDER — WARFARIN SODIUM 3 MG PO TABS
6.0000 mg | ORAL_TABLET | Freq: Once | ORAL | Status: AC
  Administered 2015-08-10: 6 mg via ORAL
  Filled 2015-08-10: qty 2

## 2015-08-10 MED ORDER — PNEUMOCOCCAL VAC POLYVALENT 25 MCG/0.5ML IJ INJ
0.5000 mL | INJECTION | INTRAMUSCULAR | Status: AC
  Administered 2015-08-12: 0.5 mL via INTRAMUSCULAR
  Filled 2015-08-10 (×3): qty 0.5

## 2015-08-10 MED ORDER — POTASSIUM CHLORIDE CRYS ER 20 MEQ PO TBCR
40.0000 meq | EXTENDED_RELEASE_TABLET | Freq: Every day | ORAL | Status: DC
  Administered 2015-08-11 – 2015-08-17 (×7): 40 meq via ORAL
  Filled 2015-08-10 (×7): qty 2

## 2015-08-10 MED ORDER — INFLUENZA VAC SPLIT QUAD 0.5 ML IM SUSY
0.5000 mL | PREFILLED_SYRINGE | INTRAMUSCULAR | Status: AC
  Administered 2015-08-11: 0.5 mL via INTRAMUSCULAR
  Filled 2015-08-10: qty 0.5

## 2015-08-10 NOTE — Progress Notes (Signed)
Patient in bed alert and oriented. Lasix drip is infusing and is effective with good output via foley catheter. Currently patient complains of no pain or discomfort. Will continue to monitor patient to end of shift.

## 2015-08-10 NOTE — Progress Notes (Signed)
ANTICOAGULATION CONSULT NOTE - Follow Up Consult  Pharmacy Consult for Coumadin Indication: atrial fibrillation  Allergies  Allergen Reactions  . Peach [Prunus Persica] Rash    AGENT: peach fuzz    Patient Measurements: Height: 5\' 4"  (162.6 cm) Weight: 253 lb 4.9 oz (114.9 kg) (Simultaneous filing. User may not have seen previous data.) IBW/kg (Calculated) : 54.7  Vital Signs: Temp: 98 F (36.7 C) (09/02 0540) Temp Source: Oral (09/02 0540) BP: 95/53 mmHg (09/02 0540) Pulse Rate: 86 (09/02 0540)  Labs:  Recent Labs  08/09/15 0624 08/10/15 0414 08/10/15 1041  HGB 8.8*  --  9.1*  HCT 30.6*  --  31.9*  PLT 131*  --  132*  LABPROT 20.3*  --  19.5*  INR 1.74*  --  1.65*  CREATININE 1.22* 1.12*  --     Estimated Creatinine Clearance: 44.9 mL/min (by C-G formula based on Cr of 1.12).  Assessment: 88yof on coumadin pta for afib, admitted after RHC for diuresis. INR on admit was below goal at 1.7 and home dose resumed. INR decreased to 1.65 today. CBC stable. No bleeding reported.  Home dose: 2mg  daily except 4mg  on Thur/Sat  Goal of Therapy:  INR 2-3 Monitor platelets by anticoagulation protocol: Yes   Plan:  1) Increase coumadin to 6mg  x 1 2) INR in AM  Deboraha Sprang 08/10/2015,12:18 PM

## 2015-08-10 NOTE — Progress Notes (Addendum)
Advanced Heart Failure Rounding Note   Subjective:   Admitted after RHC with volume overload. Started on lasix drip.   Denies SOB   RHC 08/10/15  RA mean 29 RV 73/24 PA 74/33, mean 45 PCWP mean 33 Oxygen saturations: PA 49% AO 98% Cardiac Output (Fick) 5.12  Cardiac Index (Fick) 2.27 PVR 2.3 WU   Objective:   Weight Range:  Vital Signs:   Temp:  [97.2 F (36.2 C)-98.5 F (36.9 C)] 98 F (36.7 C) (09/02 0540) Pulse Rate:  [26-107] 86 (09/02 0540) Resp:  [17-26] 18 (09/02 0540) BP: (94-156)/(52-136) 95/53 mmHg (09/02 0540) SpO2:  [67 %-100 %] 95 % (09/02 0540) Weight:  [266 lb 12.1 oz (121 kg)-266 lb 14.4 oz (121.065 kg)] 266 lb 12.1 oz (121 kg) (09/02 0540) Last BM Date: 08/09/15  Weight change: Filed Weights   08/09/15 0554 08/09/15 1301 08/10/15 0540  Weight: 279 lb (126.554 kg) 266 lb 14.4 oz (121.065 kg) 266 lb 12.1 oz (121 kg)    Intake/Output:   Intake/Output Summary (Last 24 hours) at 08/10/15 0842 Last data filed at 08/10/15 0600  Gross per 24 hour  Intake    520 ml  Output   5876 ml  Net  -5356 ml     Physical Exam: General:  Chronically ill appearing. No resp difficulty. In bed  HEENT: normal Neck: supple. JVP to jaw . Carotids 2+ bilat; no bruits. No lymphadenopathy or thryomegaly appreciated. Cor: PMI nondisplaced. Regular rate & rhythm. No rubs, gallops or murmurs. Lungs: Decreased in bases. On 2 liters [place]  Abdomen: obese, soft, nontender, nondistended. No hepatosplenomegaly. No bruits or masses. Good bowel sounds. Extremities: no cyanosis, clubbing, rash, edema Bilateral SCDs in place  Neuro: alert & orientedx3, cranial nerves grossly intact. moves all 4 extremities w/o difficulty. Affect pleasant  Telemetry: A fib 80s   Labs: Basic Metabolic Panel:  Recent Labs Lab 08/03/15 1313 08/09/15 0624 08/10/15 0414  NA 138 141 138  K 5.2* 4.9 5.1  CL 90* 91* 89*  CO2 38* 44* 41*  GLUCOSE 159* 141* 145*  BUN 16 17 14   CREATININE 1.19*  1.22* 1.12*  CALCIUM 8.5* 8.9 8.6*    Liver Function Tests: No results for input(s): AST, ALT, ALKPHOS, BILITOT, PROT, ALBUMIN in the last 168 hours. No results for input(s): LIPASE, AMYLASE in the last 168 hours. No results for input(s): AMMONIA in the last 168 hours.  CBC:  Recent Labs Lab 08/03/15 1313 08/09/15 0624  WBC 6.0 6.2  HGB 8.4* 8.8*  HCT 27.8* 30.6*  MCV 101.5* 104.1*  PLT 125* 131*    Cardiac Enzymes: No results for input(s): CKTOTAL, CKMB, CKMBINDEX, TROPONINI in the last 168 hours.  BNP: BNP (last 3 results)  Recent Labs  05/09/15 0930 08/03/15 1314 08/09/15 1155  BNP 661.2* 570.3* 561.8*    ProBNP (last 3 results)  Recent Labs  06/08/15 1619  PROBNP 370.0*      Other results:  Imaging:  No results found.   Medications:     Scheduled Medications: . amLODipine  5 mg Oral Daily  . atorvastatin  20 mg Oral Daily  . gabapentin  300 mg Oral QHS  . losartan  25 mg Oral Daily  . metoprolol tartrate  12.5 mg Oral BID  . pantoprazole  40 mg Oral Daily  . potassium chloride SA  40 mEq Oral BID  . sodium chloride  3 mL Intravenous Q12H  . sodium chloride  3 mL Intravenous Q12H  . Warfarin -  Pharmacist Dosing Inpatient   Does not apply q1800     Infusions: . furosemide (LASIX) infusion 10 mg/hr (08/09/15 1248)     PRN Medications:  sodium chloride, sodium chloride, acetaminophen, ondansetron (ZOFRAN) IV, sodium chloride, sodium chloride   Assessment/Plan     1. Acute/Chronic diastolic CHF:  Admitted with RHC with elevated filling pressures. Brisk diuresis with lasix drip. Weight down 13 pounds. Volume status remains elevated. Continue lasix drip.  Renal function stable. K 4.9 on admit and up to 5.1 today. Stop potassium and losartan. Follow BMET closely.   2. HTN: BP soft. SBP in 90s. Stop losartan and amlodipine.  3. Pulmonary Venous Hypertension: Likely component from OHS. RHC yesterday. PVR 2.3 .  4. Mitral  regurgitation: Significant murmur on exam. Trivial by prior echo report, 3+ by cath. We could assess by TEE, but think I will start with RHC and treatment of CHF as above. Not sure that she would have good repair options if we were to document severe MR (age, size).  5. Atrial fibrillation: Chronic. Continue rate control/anticoagulation strategy. She is only on low dose metoprolol. On coumadin. INR pending. 6. Anemia- Hgb 8.8 . Check CBC now. Check iron stores.  7. Deconditioning- consult PT.  Length of Stay: 1   CLEGG,AMY  NP-C   08/10/2015, 8:42 AM  Advanced Heart Failure Team Pager [phone] (M-F; 7a - 4p)  Please contact [facility] for night-coverage after hours (4p -7a ) and weekends on [email or web address]  Patient seen with NP, agree with the above note.  1. Acute on chronic diastolic CHF: Restrictive picture on RHC from 9/1.  Markedly elevated filling pressures.  Still very volume overloaded. Creatinine stable.   - Continue Lasix gtt at current rate.  - Hold amlodipine and losartan with soft BP.   2. Pulmonary hypertension: Think this is pulmonary venous hypertension that will need to be treated with diuresis.  3. Atrial fibrillation: Stable, continue coumadin.  4. OHS: Suspect OHS, she is on home oxygen.  5. PT consult.   Loralie Champagne 08/10/2015 12:35 PM

## 2015-08-10 NOTE — Care Management Note (Signed)
Case Management Note  Patient Details  Name: [name] MRN: [id number] Date of Birth: Mar 12, 1926  Subjective/Objective:     Admitted with CHF               Action/Plan: Patient lives with her daughter [name] [phone]). Weak, does not move around a lot. Patient refused SNF / daughter [name] present. Patient plans to return home with [facility] [place] choice offered patient chose [facility]. Butch Penny with [place] called for arrangements. Patient use a walker at home, has private insurance with [facility] and does not have any problems getting her medication. CM will continue to follow for DCP.  Expected Discharge Date:    possibly 08/15/2015              Expected Discharge Plan:  [place]  Discharge planning Services  CM Consult     Choice offered to:  Patient  HH Arranged:  RN, Disease Management, PT, Nurse's Aide,SW [place] Agency:  [place]  Status of Service:  In process, will continue to follow  Sherrilyn Rist [phone] 08/10/2015, 3:02 PM

## 2015-08-10 NOTE — Evaluation (Addendum)
Physical Therapy Evaluation Patient Details Name: [name] MRN: [id number] DOB: [birth year] Today's Date: 08/10/2015   History of Present Illness  81 yo with chronic atrial fibrillation on coumadin, chronic diastolic CHF, and OHS presents for cardiology evaluation. She has had several recent admissions. Heart cath 9/1  Clinical Impression  Pt very pleasant and frustrated with her lack of progression and continued limited function since June of this year. Pt with gait limited by fatigue and what dgtr reports as seizures, occurred during session and present as presyncope with BP 119/53 after 3 min seated rest. With transition to standing BP 113/53, MAP 60 with HR 125 and O2 88% on 2L, after 5' of gait with RW and 2 min standing BP 106/53 seated. Pt limited by body habitus, cardiopulmonary status, weakness and fatigue. Pt will benefit from acute therapy to maximize mobility, function, gait and activity tolerance to decrease burden of care.     Follow Up Recommendations Home health PT    Equipment Recommendations  None recommended by PT    Recommendations for Other Services OT consult     Precautions / Restrictions Precautions Precautions: Fall Precaution Comments: watch BP and sats      Mobility  Bed Mobility               General bed mobility comments: pt in chair on arrival  Transfers Overall transfer level: Needs assistance   Transfers: Sit to/from Stand Sit to Stand: Min assist;Min guard         General transfer comment: minguard from recliner with bil armrest, min assist from bed with cues for anterior translation and safety  Ambulation/Gait Ambulation/Gait assistance: Min guard Ambulation Distance (Feet): 20 Feet Assistive device: Rolling walker (2 wheeled) Gait Pattern/deviations: Step-through pattern;Decreased stride length;Wide base of support   Gait velocity interpretation: Below normal speed for age/gender General Gait Details: pt with wide base  of support due to proximal thigh girth and legs rubbing. Cues for posture and position in RW as well as cues for breathing technique. After 3' pt stating she felt weak and helped pt to sit EOB for recovery with pt with presyncopal event with blank stare, jerking RUE briefly and BP 119/53 after 3 min sitting  Stairs            Wheelchair Mobility    Modified Rankin (Stroke Patients Only)       Balance Overall balance assessment: Needs assistance;History of Falls   Sitting balance-Leahy Scale: Good       Standing balance-Leahy Scale: Poor                               Pertinent Vitals/Pain Pain Assessment: No/denies pain  HR 98-132 with activity    Home Living Family/patient expects to be discharged to:: Private residence Living Arrangements: Children Available Help at Discharge: Family;Available 24 hours/day Type of Home: House Home Access: Ramped entrance     Home Layout: Two level;Bed/bath upstairs Home Equipment: Walker - 2 wheels;Walker - 4 wheels;Wheelchair - [employer];Shower seat      Prior Function Level of Independence: Needs assistance   Gait / Transfers Assistance Needed: pt has been limited to grossly 10' gait with RW and dgtr assist since June. She does walk stairs with 2 person assist with rest halfway through gait  ADL's / Homemaking Assistance Needed: dgtr assists with bathing seated, dressing and dgtr does all cooking and housework  Comments: pt with increased  heart failure, syncope and weakness limiting function since June prior to that time was independent     Hand Dominance        Extremity/Trunk Assessment   Upper Extremity Assessment: Generalized weakness           Lower Extremity Assessment: Generalized weakness      Cervical / Trunk Assessment:  (lordotic due to large sacral shelf)  Communication   Communication: No difficulties  Cognition Arousal/Alertness: Awake/alert Behavior During Therapy:  WFL for tasks assessed/performed Overall Cognitive Status: Within Functional Limits for tasks assessed                      General Comments      Exercises        Assessment/Plan    PT Assessment Patient needs continued PT services  PT Diagnosis Difficulty walking;Generalized weakness   PT Problem List Decreased strength;Decreased activity tolerance;Decreased balance;Decreased knowledge of use of DME;Obesity;Decreased mobility;Cardiopulmonary status limiting activity  PT Treatment Interventions Gait training;DME instruction;Stair training;Functional mobility training;Therapeutic activities;Therapeutic exercise;Patient/family education   PT Goals (Current goals can be found in the Care Plan section) Acute Rehab PT Goals Patient Stated Goal: "return to caring for myself" PT Goal Formulation: With patient/family Time For Goal Achievement: 08/24/15 Potential to Achieve Goals: Fair    Frequency Min 3X/week   Barriers to discharge        Co-evaluation               End of Session Equipment Utilized During Treatment: Gait belt;Oxygen Activity Tolerance: Patient tolerated treatment well Patient left: in chair;with call bell/phone within reach;with family/visitor present;with nursing/sitter in room Nurse Communication: Mobility status;Precautions    Functional Assessment Tool Used: clinical judgement Functional Limitation: Mobility: Walking and moving around Mobility: Walking and Moving Around Current Status (N3976): At least 20 percent but less than 40 percent impaired, limited or restricted Mobility: Walking and Moving Around Goal Status [phone]): At least 1 percent but less than 20 percent impaired, limited or restricted    Time: 1327-1402 PT Time Calculation (min) (ACUTE ONLY): 35 min   Charges:   PT Evaluation $Initial PT Evaluation Tier I: 1 Procedure PT Treatments $Therapeutic Activity: 8-22 mins   PT G Codes:   PT G-Codes **NOT FOR INPATIENT  CLASS** Functional Assessment Tool Used: clinical judgement Functional Limitation: Mobility: Walking and moving around Mobility: Walking and Moving Around Current Status (F7902): At least 20 percent but less than 40 percent impaired, limited or restricted Mobility: Walking and Moving Around Goal Status [phone]): At least 1 percent but less than 20 percent impaired, limited or restricted    Melford Aase 08/10/2015, 2:28 PM  Elwyn Reach, [place]

## 2015-08-10 NOTE — Progress Notes (Signed)
Pts daughter requested home health upon D/C, spoke with case manager in regards to family request, RN spoke with PT d/t pt appeared deconditioned, when PT was in with pt, pt has syncopal symptoms and O2 dropped in low 80's while on 2L with exertion, VSS, pt now up in the chair, pt stable

## 2015-08-11 DIAGNOSIS — I482 Chronic atrial fibrillation: Secondary | ICD-10-CM | POA: Diagnosis not present

## 2015-08-11 DIAGNOSIS — I270 Primary pulmonary hypertension: Secondary | ICD-10-CM

## 2015-08-11 DIAGNOSIS — Z9981 Dependence on supplemental oxygen: Secondary | ICD-10-CM | POA: Diagnosis not present

## 2015-08-11 DIAGNOSIS — I5033 Acute on chronic diastolic (congestive) heart failure: Secondary | ICD-10-CM | POA: Diagnosis not present

## 2015-08-11 DIAGNOSIS — E119 Type 2 diabetes mellitus without complications: Secondary | ICD-10-CM | POA: Diagnosis not present

## 2015-08-11 DIAGNOSIS — E1142 Type 2 diabetes mellitus with diabetic polyneuropathy: Secondary | ICD-10-CM | POA: Diagnosis not present

## 2015-08-11 DIAGNOSIS — I110 Hypertensive heart disease with heart failure: Secondary | ICD-10-CM | POA: Diagnosis not present

## 2015-08-11 DIAGNOSIS — I272 Other secondary pulmonary hypertension: Secondary | ICD-10-CM | POA: Diagnosis not present

## 2015-08-11 DIAGNOSIS — Z6841 Body Mass Index (BMI) 40.0 and over, adult: Secondary | ICD-10-CM | POA: Diagnosis not present

## 2015-08-11 LAB — CBC
HEMATOCRIT: 31.7 % — AB (ref 36.0–46.0)
Hemoglobin: 9.4 g/dL — ABNORMAL LOW (ref 12.0–15.0)
MCH: 30.7 pg (ref 26.0–34.0)
MCHC: 29.7 g/dL — ABNORMAL LOW (ref 30.0–36.0)
MCV: 103.6 fL — AB (ref 78.0–100.0)
Platelets: 120 10*3/uL — ABNORMAL LOW (ref 150–400)
RBC: 3.06 MIL/uL — ABNORMAL LOW (ref 3.87–5.11)
RDW: 14.3 % (ref 11.5–15.5)
WBC: 6.6 10*3/uL (ref 4.0–10.5)

## 2015-08-11 LAB — BASIC METABOLIC PANEL
ANION GAP: 5 (ref 5–15)
BUN: 12 mg/dL (ref 6–20)
CALCIUM: 8.5 mg/dL — AB (ref 8.9–10.3)
CO2: 46 mmol/L — AB (ref 22–32)
CREATININE: 1.03 mg/dL — AB (ref 0.44–1.00)
Chloride: 85 mmol/L — ABNORMAL LOW (ref 101–111)
GFR calc Af Amer: 55 mL/min — ABNORMAL LOW (ref >60)
GFR calc non Af Amer: 48 mL/min — ABNORMAL LOW (ref >60)
GLUCOSE: 174 mg/dL — AB (ref 65–99)
Potassium: 3.8 mmol/L (ref 3.5–5.1)
Sodium: 136 mmol/L (ref 135–145)

## 2015-08-11 LAB — PROTIME-INR
INR: 1.65 — ABNORMAL HIGH (ref 0.00–1.49)
Prothrombin Time: 19.6 seconds — ABNORMAL HIGH (ref 11.6–15.2)

## 2015-08-11 MED ORDER — WARFARIN SODIUM 3 MG PO TABS
6.0000 mg | ORAL_TABLET | Freq: Once | ORAL | Status: AC
  Administered 2015-08-11: 6 mg via ORAL
  Filled 2015-08-11: qty 2

## 2015-08-11 NOTE — Progress Notes (Signed)
ANTICOAGULATION CONSULT NOTE - Follow Up Consult  Pharmacy Consult for Coumadin Indication: atrial fibrillation  Allergies  Allergen Reactions  . Peach [Prunus Persica] Rash    AGENT: peach fuzz    Patient Measurements: Height: 5\' 4"  (162.6 cm) Weight: 252 lb 3.2 oz (114.397 kg) IBW/kg (Calculated) : 54.7  Vital Signs: Temp: 98.1 F (36.7 C) (09/03 0458) Temp Source: Oral (09/03 0458) BP: 127/59 mmHg (09/03 0458) Pulse Rate: 118 (09/03 0458)  Labs:  Recent Labs  08/09/15 0624 08/10/15 0414 08/10/15 1041 08/11/15 0337  HGB 8.8*  --  9.1* 9.4*  HCT 30.6*  --  31.9* 31.7*  PLT 131*  --  132* 120*  LABPROT 20.3*  --  19.5* 19.6*  INR 1.74*  --  1.65* 1.65*  CREATININE 1.22* 1.12*  --  1.03*    Estimated Creatinine Clearance: 48.6 mL/min (by C-G formula based on Cr of 1.03).  Assessment: [name] on coumadin pta for afib, admitted after RHC for diuresis. INR on admit was below goal at 1.74. Home dose: 2mg  daily except 4mg  on Thur/Sat  INR currently 1.65. Hgb and plts stable- no bleeding noted  Goal of Therapy:  INR 2-3 Monitor platelets by anticoagulation protocol: Yes   Plan:  -warfarin 6mg  po x1 tonight -daily INR -follow for bleeding  Azam Gervasi D. Emillee Talsma, PharmD, BCPS Clinical Pharmacist Pager: [phone] 08/11/2015 12:58 PM

## 2015-08-11 NOTE — Progress Notes (Signed)
Patient Name: [name]      SUBJECTIVE 81 year old woman with diastolic heart failure came in for right heart catheterization demonstrating significant filling pressure elevation and restrictive pattern. Initial diuresis was 5L and is associated with low blood pressure prompting discontinuation of amlodipine and losartan She has some degree of mitral regurgitation poorly clarified with discrepant between echo and catheterization She has permanent atrial fibrillation and pulmonary hypertension Echocardiogram 9/16 demonstrated normal LV function with RV dilatation and systolic dysfunction severe RA pulmonary pressures 93 surprisingly modest left atrial enlargement   she is feeling better this am without sob albeit at rest  Past Medical History  Diagnosis Date  . 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 DVT (deep venous thrombosis)   . Arthritis   . Hyperlipidemia   . Hypertension   . CHF (congestive heart failure)   . Anemia   . Cholelithiasis   . GI bleed   . Breast cancer 2012    right  . Peripheral neuropathy   . Heart murmur   . Syncope 05/09/2015  . Shortness of breath dyspnea   . Diabetes mellitus   . GERD (gastroesophageal reflux disease)     Scheduled Meds:  Scheduled Meds: . atorvastatin  20 mg Oral Daily  . gabapentin  300 mg Oral QHS  . metoprolol tartrate  12.5 mg Oral BID  . pantoprazole  40 mg Oral Daily  . pneumococcal 23 valent vaccine  0.5 mL Intramuscular Tomorrow-1000  . potassium chloride  40 mEq Oral Daily  . sodium chloride  3 mL Intravenous Q12H  . sodium chloride  3 mL Intravenous Q12H  . Warfarin - Pharmacist Dosing Inpatient   Does not apply q1800   Continuous Infusions: . furosemide (LASIX) infusion 10 mg/hr (08/09/15 1248)   sodium chloride, sodium chloride, acetaminophen, ondansetron (ZOFRAN) IV, sodium chloride, sodium chloride    PHYSICAL EXAM Filed Vitals:     08/10/15 1804 08/10/15 2036 08/11/15 0458 08/11/15 0625  BP: 114/61 106/54 127/59   Pulse: 81 91 118   Temp: 99.2 F (37.3 C) 97.2 F (36.2 C) 98.1 F (36.7 C)   TempSrc: Oral Oral Oral   Resp: 16 18 20    Height:      Weight:    252 lb 3.2 oz (114.397 kg)  SpO2: 100% 100% 100%     General appearance: alert, cooperative and mild distress Neck: JVD - 9 cm above sternal notch Lungs: rales bilaterally Heart: irregularly irregular rhythm Abdomen: soft, non-tender; bowel sounds normal; no masses,  no organomegaly Extremities: edema -3+ Skin: Skin color, texture, turgor normal. No rashes or lesions Neurologic: Grossly normal  TELEMETRY: Reviewed telemetry pt in afib RVR:    Intake/Output Summary (Last 24 hours) at 08/11/15 1149 Last data filed at 08/11/15 0930  Gross per 24 hour  Intake 969.83 ml  Output   4375 ml  Net -3405.17 ml    LABS: Basic Metabolic Panel:  Recent Labs Lab 08/09/15 0624 08/10/15 0414 08/11/15 0337  NA 141 138 136  K 4.9 5.1 3.8  CL 91* 89* 85*  CO2 44* 41* 46*  GLUCOSE 141* 145* 174*  BUN 17 14 12   CREATININE 1.22* 1.12* 1.03*  CALCIUM 8.9 8.6* 8.5*   Cardiac Enzymes: No results for input(s): CKTOTAL, CKMB, CKMBINDEX, TROPONINI in the last 72 hours. CBC:  Recent Labs Lab 08/09/15 0624 08/10/15 1041 08/11/15 0337  WBC 6.2  5.4 6.6  HGB 8.8* 9.1* 9.4*  HCT 30.6* 31.9* 31.7*  MCV 104.1* 103.2* 103.6*  PLT 131* 132* 120*   PROTIME:  Recent Labs  08/09/15 0624 08/10/15 1041 08/11/15 0337  LABPROT 20.3* 19.5* 19.6*  INR 1.74* 1.65* 1.65*   Liver Function Tests: No results for input(s): AST, ALT, ALKPHOS, BILITOT, PROT, ALBUMIN in the last 72 hours. No results for input(s): LIPASE, AMYLASE in the last 72 hours. BNP: BNP (last 3 results)  Recent Labs  05/09/15 0930 08/03/15 1314 08/09/15 1155  BNP 661.2* 570.3* 561.8*    ProBNP (last 3 results)  Recent Labs  06/08/15 1619  PROBNP 370.0*    D-Dimer: No  results for input(s): DDIMER in the last 72 hours. Hemoglobin A1C: No results for input(s): HGBA1C in the last 72 hours. Fasting Lipid Panel: No results for input(s): CHOL, HDL, LDLCALC, TRIG, CHOLHDL, LDLDIRECT in the last 72 hours. Thyroid Function Tests:  Recent Labs  08/09/15 1155  TSH 2.198   Anemia Panel:  Recent Labs  08/10/15 1041  TIBC 466*  IRON 94         ASSESSMENT AND PLAN:  Active Problems:   ATRIAL FIBRILLATION, CHRONIC   Pulmonary hypertension, moderate to severe   Acute on chronic diastolic CHF (congestive heart failure)   CHF (congestive heart failure)  We'll continue IV diuresis with Lasix drip Will increase metoprolol little bit. We'll hold off on amlodipine and losartan because of hypotension yesterday. Continue home oxygen. Appreciate PT consult yesterday.  Signed, Virl Axe MD  08/11/2015

## 2015-08-12 ENCOUNTER — Encounter (HOSPITAL_COMMUNITY): Payer: Self-pay

## 2015-08-12 DIAGNOSIS — E878 Other disorders of electrolyte and fluid balance, not elsewhere classified: Secondary | ICD-10-CM

## 2015-08-12 DIAGNOSIS — Z9981 Dependence on supplemental oxygen: Secondary | ICD-10-CM | POA: Diagnosis not present

## 2015-08-12 DIAGNOSIS — E1142 Type 2 diabetes mellitus with diabetic polyneuropathy: Secondary | ICD-10-CM | POA: Diagnosis not present

## 2015-08-12 DIAGNOSIS — I482 Chronic atrial fibrillation: Secondary | ICD-10-CM | POA: Diagnosis not present

## 2015-08-12 DIAGNOSIS — IMO0001 Reserved for inherently not codable concepts without codable children: Secondary | ICD-10-CM

## 2015-08-12 DIAGNOSIS — I272 Other secondary pulmonary hypertension: Secondary | ICD-10-CM | POA: Diagnosis not present

## 2015-08-12 DIAGNOSIS — Z6841 Body Mass Index (BMI) 40.0 and over, adult: Secondary | ICD-10-CM | POA: Diagnosis not present

## 2015-08-12 DIAGNOSIS — I110 Hypertensive heart disease with heart failure: Secondary | ICD-10-CM | POA: Diagnosis not present

## 2015-08-12 DIAGNOSIS — I5033 Acute on chronic diastolic (congestive) heart failure: Secondary | ICD-10-CM | POA: Diagnosis not present

## 2015-08-12 DIAGNOSIS — Z0389 Encounter for observation for other suspected diseases and conditions ruled out: Secondary | ICD-10-CM

## 2015-08-12 DIAGNOSIS — E119 Type 2 diabetes mellitus without complications: Secondary | ICD-10-CM | POA: Diagnosis not present

## 2015-08-12 LAB — BASIC METABOLIC PANEL
Anion gap: 5 (ref 5–15)
BUN: 11 mg/dL (ref 6–20)
CO2: 46 mmol/L — ABNORMAL HIGH (ref 22–32)
Calcium: 8.4 mg/dL — ABNORMAL LOW (ref 8.9–10.3)
Chloride: 86 mmol/L — ABNORMAL LOW (ref 101–111)
Creatinine, Ser: 1.14 mg/dL — ABNORMAL HIGH (ref 0.44–1.00)
GFR calc Af Amer: 49 mL/min — ABNORMAL LOW (ref >60)
GFR, EST NON AFRICAN AMERICAN: 42 mL/min — AB (ref >60)
Glucose, Bld: 151 mg/dL — ABNORMAL HIGH (ref 65–99)
POTASSIUM: 4.3 mmol/L (ref 3.5–5.1)
SODIUM: 137 mmol/L (ref 135–145)

## 2015-08-12 LAB — PROTIME-INR
INR: 1.86 — ABNORMAL HIGH (ref 0.00–1.49)
PROTHROMBIN TIME: 21.3 s — AB (ref 11.6–15.2)

## 2015-08-12 MED ORDER — WARFARIN SODIUM 3 MG PO TABS
6.0000 mg | ORAL_TABLET | Freq: Once | ORAL | Status: AC
  Administered 2015-08-12: 6 mg via ORAL
  Filled 2015-08-12: qty 2

## 2015-08-12 NOTE — Progress Notes (Signed)
ANTICOAGULATION CONSULT NOTE - Follow Up Consult  Pharmacy Consult for Coumadin Indication: atrial fibrillation  Allergies  Allergen Reactions  . Peach [Prunus Persica] Rash    AGENT: peach fuzz    Patient Measurements: Height: 5\' 4"  (162.6 cm) Weight: 255 lb 1.6 oz (115.713 kg) IBW/kg (Calculated) : 54.7  Vital Signs: Temp: 98.7 F (37.1 C) (09/04 0521) Temp Source: Oral (09/04 0521) BP: 106/53 mmHg (09/04 0521) Pulse Rate: 88 (09/04 0521)  Labs:  Recent Labs  08/10/15 0414 08/10/15 1041 08/11/15 0337 08/12/15 0410  HGB  --  9.1* 9.4*  --   HCT  --  31.9* 31.7*  --   PLT  --  132* 120*  --   LABPROT  --  19.5* 19.6* 21.3*  INR  --  1.65* 1.65* 1.86*  CREATININE 1.12*  --  1.03* 1.14*    Estimated Creatinine Clearance: 44.2 mL/min (by C-G formula based on Cr of 1.14).  Assessment: 88yof on coumadin pta for afib, admitted after RHC for diuresis. INR on admit was below goal at 1.74. Home dose: 2mg  daily except 4mg  on Thur/Sat  INR currently 1.86. Hgb and plts stable- no bleeding noted.  Goal of Therapy:  INR 2-3 Monitor platelets by anticoagulation protocol: Yes   Plan:  -warfarin 6mg  po x1 tonight -daily INR -follow for bleeding  Zykeriah Mathia D. Hunner Garcon, PharmD, BCPS Clinical Pharmacist Pager: [phone] 08/12/2015 11:31 AM

## 2015-08-12 NOTE — Evaluation (Signed)
Occupational Therapy Evaluation Patient Details Name: [name] MRN: [id number] DOB: [birth year] Today's Date: 08/12/2015    History of Present Illness 81 yo with chronic atrial fibrillation on coumadin, chronic diastolic CHF, and OHS presents for cardiology evaluation. She has had several recent admissions. Heart cath 9/1   Clinical Impression   Pt admitted with above. She demonstrates the below listed deficits and will benefit from continued OT to maximize safety and independence with BADLs.  Pt presents to OT with generalized weakness.  She requires mod A, overall for ADLs.  Daughter has been assisting her PTA.   Will follow acutely, and recommend HHOT at discharge.       Follow Up Recommendations  Home health OT;Supervision/Assistance - 24 hour    Equipment Recommendations  None recommended by OT    Recommendations for Other Services       Precautions / Restrictions Precautions Precautions: Fall Precaution Comments: watch BP and sats      Mobility Bed Mobility Overal bed mobility: Needs Assistance Bed Mobility: Supine to Sit     Supine to sit: Min assist;HOB elevated     General bed mobility comments: Pt eager to attempt to perform wihtout assist.  She is dependent upon [facility] being elevated and on use of grab bars.  She requires increased time.  Needs assist to scoot hips to EOB   Transfers Overall transfer level: Needs assistance Equipment used: Rolling walker (2 wheeled) Transfers: Sit to/from [facility] Sit to Stand: Min assist;Min guard Stand pivot transfers: Min guard       General transfer comment: Pt requires cues for hand placement.  Min A to power up into standing from bed.  Requires min guard assist for balance and safety     Balance Overall balance assessment: Needs assistance Sitting-balance support: Feet supported Sitting balance-Leahy Scale: Good     Standing balance support: Bilateral upper extremity  supported Standing balance-Leahy Scale: Poor Standing balance comment: reliant on bil. UE support                             ADL Overall ADL's : Needs assistance/impaired Eating/Feeding: Independent;Sitting   Grooming: Wash/dry hands;Wash/dry face;Oral care;Set up;Sitting   Upper Body Bathing: Minimal assitance;Sitting   Lower Body Bathing: Moderate assistance;Sit to/from stand   Upper Body Dressing : Minimal assistance;Sitting   Lower Body Dressing: Maximal assistance;Sit to/from stand   Toilet Transfer: Minimal assistance;Ambulation;BSC;RW   Toileting- Clothing Manipulation and Hygiene: Maximal assistance;Sit to/from stand       Functional mobility during ADLs: Minimal assistance;Rolling walker General ADL Comments: Pt unable to access LEs due to body habitus and generalized weakness      Vision     Perception     Praxis      Pertinent Vitals/Pain Pain Assessment: No/denies pain     Hand Dominance Right   Extremity/Trunk Assessment Upper Extremity Assessment Upper Extremity Assessment: Generalized weakness;LUE deficits/detail LUE Deficits / Details: very limited AROM Lt shoulder - pt reports she fell and dislocated it.  Elbow distally [facility]    Lower Extremity Assessment Lower Extremity Assessment: Generalized weakness       Communication Communication Communication: No difficulties   Cognition Arousal/Alertness: Awake/alert Behavior During Therapy: WFL for tasks assessed/performed Overall Cognitive Status: Within Functional Limits for tasks assessed                     General Comments  Exercises       Shoulder Instructions      Home Living Family/patient expects to be discharged to:: Private residence Living Arrangements: Children Available Help at Discharge: Family;Available 24 hours/day Type of Home: House Home Access: Ramped entrance     Home Layout: Two level;Bed/bath upstairs Alternate Level Stairs-Number of  Steps: 14 Alternate Level Stairs-Rails: Left Bathroom Shower/Tub: Tub/shower unit Shower/tub characteristics: Curtain Biochemist, clinical: Standard     Home Equipment: Environmental consultant - 2 wheels;Walker - 4 wheels;Wheelchair - [employer];Shower seat          Prior Functioning/Environment Level of Independence: Needs assistance  Gait / Transfers Assistance Needed: pt has been limited to grossly 10' gait with RW and dgtr assist since June. She does walk stairs with 2 person assist with rest halfway through gait ADL's / Homemaking Assistance Needed: dgtr assists with bathing seated, dressing and dgtr does all cooking and housework   Comments: pt with increased heart failure, syncope and weakness limiting function since June prior to that time was independent    OT Diagnosis: Generalized weakness   OT Problem List: Decreased strength;Decreased activity tolerance;Impaired balance (sitting and/or standing);Decreased safety awareness;Decreased knowledge of use of DME or AE;Cardiopulmonary status limiting activity;Obesity   OT Treatment/Interventions: Self-care/ADL training;DME and/or AE instruction;Therapeutic activities;Patient/family education;Balance training    OT Goals(Current goals can be found in the care plan section) Acute Rehab OT Goals Patient Stated Goal: "return to caring for myself" OT Goal Formulation: With patient Time For Goal Achievement: 08/26/15 Potential to Achieve Goals: Good ADL Goals Pt Will Perform Grooming: with min guard assist;standing Pt Will Perform Upper Body Bathing: with supervision;with set-up;sitting Pt Will Perform Lower Body Bathing: with min assist;with adaptive equipment;sit to/from stand Pt Will Perform Upper Body Dressing: with set-up;with supervision;sitting Pt Will Perform Lower Body Dressing: with min assist;with adaptive equipment;sit to/from stand Pt Will Transfer to Toilet: with min guard assist;ambulating;regular height toilet;bedside  commode;grab bars Pt Will Perform Toileting - Clothing Manipulation and hygiene: with min assist;sit to/from stand;with adaptive equipment  OT Frequency: Min 2X/week   Barriers to D/C:            Co-evaluation              End of Session Equipment Utilized During Treatment: Surveyor, mining Communication: Mobility status  Activity Tolerance: Patient limited by fatigue Patient left: in chair;with call bell/phone within reach   Time: 1212-1244 OT Time Calculation (min): 32 min Charges:  OT General Charges $OT Visit: 1 Procedure OT Evaluation $Initial OT Evaluation Tier I: 1 Procedure OT Treatments $Therapeutic Activity: 8-22 mins G-Codes:    Wilberta Dorvil M 08-19-15, 1:11 PM

## 2015-08-12 NOTE — Progress Notes (Signed)
Subjective:  She says her SOB is improved, looks weak  Objective:  Vital Signs in the last 24 hours: Temp:  [98.1 F (36.7 C)-99 F (37.2 C)] 98.7 F (37.1 C) (09/04 0521) Pulse Rate:  [85-96] 88 (09/04 0521) Resp:  [18-20] 18 (09/04 0521) BP: (106-118)/(48-55) 106/53 mmHg (09/04 0521) SpO2:  [98 %-100 %] 98 % (09/03 2132) Weight:  [255 lb 1.6 oz (115.713 kg)] 255 lb 1.6 oz (115.713 kg) (09/04 0521)  Intake/Output from previous day:  Intake/Output Summary (Last 24 hours) at 08/12/15 0905 Last data filed at 08/12/15 0902  Gross per 24 hour  Intake 847.17 ml  Output   2125 ml  Net -1277.83 ml    Physical Exam: General appearance: alert, cooperative, no distress, morbidly obese and frail appearing Lungs: decreased breath sounds Heart: irregularly irregular rhythm Extremities: difficult to assess edema secondary to morbid obesity Skin: warm, dry Neurologic: Grossly normal   Rate: 86  Rhythm: atrial fibrillation  Lab Results:  Recent Labs  08/10/15 1041 08/11/15 0337  WBC 5.4 6.6  HGB 9.1* 9.4*  PLT 132* 120*    Recent Labs  08/11/15 0337 08/12/15 0410  NA 136 137  K 3.8 4.3  CL 85* 86*  CO2 46* 46*  GLUCOSE 174* 151*  BUN 12 11  CREATININE 1.03* 1.14*   No results for input(s): TROPONINI in the last 72 hours.  Invalid input(s): CK, MB  Recent Labs  08/12/15 0410  INR 1.86*    Scheduled Meds: . atorvastatin  20 mg Oral Daily  . gabapentin  300 mg Oral QHS  . metoprolol tartrate  12.5 mg Oral BID  . pantoprazole  40 mg Oral Daily  . pneumococcal 23 valent vaccine  0.5 mL Intramuscular Tomorrow-1000  . potassium chloride  40 mEq Oral Daily  . sodium chloride  3 mL Intravenous Q12H  . sodium chloride  3 mL Intravenous Q12H  . Warfarin - Pharmacist Dosing Inpatient   Does not apply q1800   Continuous Infusions: . furosemide (LASIX) infusion 10 mg/hr (08/11/15 1447)   PRN Meds:.sodium chloride, sodium chloride, acetaminophen, ondansetron  (ZOFRAN) IV, sodium chloride, sodium chloride   Imaging: Imaging results have been reviewed  Cardiac Studies: Echo 08/09/15 Study Conclusions  - Left ventricle: The cavity size was normal. There was severe concentric hypertrophy. Systolic function was normal. The estimated ejection fraction was in the range of 55% to 60%. Wall motion was normal; there were no regional wall motion abnormalities. Doppler parameters are consistent with high ventricular filling pressure. - Mitral valve: Moderately calcified annulus. Mildly thickened, moderately calcified leaflets . - Left atrium: The atrium was mildly dilated. - Right ventricle: The cavity size was moderately dilated. Wall thickness was normal. Systolic function was moderately reduced. - Right atrium: The atrium was severely dilated. - Tricuspid valve: There was moderate regurgitation. - Pulmonary arteries: Systolic pressure was severely increased. PA peak pressure: 93 mm Hg (S).  Impressions:  - Compared to the prior study, there has been no significant interval change.   Rt Heart Cath: 08/09/15 1. Markedly elevated right and left heart filling pressures in a restrictive pattern.  2. Pulmonary venous hypertension.  Assessment/Plan:   81 year old woman with diastolic heart failure came in for right heart catheterization demonstrating significant filling pressure elevation and restrictive pattern. Initial diuresis was 5L and is associated with low blood pressure prompting discontinuation of amlodipine and losartan She has some degree of mitral regurgitation poorly clarified with discrepant between echo and catheterization She  has permanent atrial fibrillation, on warfarin, and severe pulmonary hypertension. I/O negative 9/5L, wgt down 24 lbs, on IV Lasix drip.   Principal Problem:   Acute on chronic diastolic CHF (congestive heart failure) Active Problems:   Hypertensive cardiovascular disease   Chronic atrial  fibrillation- CHADs VASc =6   Mitral valve regurgitation with secondary PH   Chronic anticoagulation-Warfarin   Pulmonary hypertension, moderate to severe   Severe obesity (BMI >= 40)   Normal coronary arteries-June 2016 chlrodirde depletion  PLAN: Will discuss transition to PO diuretics with Dr Caryl Comes.   Kerin Ransom PA-C 08/12/2015, 9:05 AM [phone] We'll continue IV diuretics. Until her BUN and creatinine start to bump her increasing bicarbonate is issue with her respiratory status. We'll begin her on chloride repletion  She tolerated the little bit of metoprolol yesterday. We will increase it gently.  Ambulate and mobilize

## 2015-08-13 LAB — PROTIME-INR
INR: 2.25 — AB (ref 0.00–1.49)
PROTHROMBIN TIME: 24.6 s — AB (ref 11.6–15.2)

## 2015-08-13 MED ORDER — ACETAZOLAMIDE 250 MG PO TABS
250.0000 mg | ORAL_TABLET | Freq: Every day | ORAL | Status: DC
  Administered 2015-08-13 – 2015-08-16 (×4): 250 mg via ORAL
  Filled 2015-08-13 (×4): qty 1

## 2015-08-13 MED ORDER — WARFARIN SODIUM 3 MG PO TABS
6.0000 mg | ORAL_TABLET | Freq: Once | ORAL | Status: AC
  Administered 2015-08-13: 6 mg via ORAL
  Filled 2015-08-13: qty 2

## 2015-08-13 NOTE — Care Management Important Message (Signed)
Important Message  Patient Details  Name: [name] MRN: [id number] Date of Birth: 13-Mar-1926   Medicare Important Message Given:  Yes-second notification given    Loann Quill 08/13/2015, 8:55 AM

## 2015-08-13 NOTE — Progress Notes (Signed)
ANTICOAGULATION CONSULT NOTE - Follow Up Consult  Pharmacy Consult for Coumadin Indication: atrial fibrillation  Allergies  Allergen Reactions  . Peach [Prunus Persica] Rash    AGENT: peach fuzz    Patient Measurements: Height: 5\' 4"  (162.6 cm) Weight: 251 lb 8 oz (114.08 kg) IBW/kg (Calculated) : 54.7  Vital Signs: Temp: 98.8 F (37.1 C) (09/05 1417) Temp Source: Oral (09/05 1417) BP: 98/54 mmHg (09/05 1417) Pulse Rate: 94 (09/05 1417)  Labs:  Recent Labs  08/11/15 0337 08/12/15 0410 08/13/15 0310  HGB 9.4*  --   --   HCT 31.7*  --   --   PLT 120*  --   --   LABPROT 19.6* 21.3* 24.6*  INR 1.65* 1.86* 2.25*  CREATININE 1.03* 1.14*  --     Estimated Creatinine Clearance: 43.9 mL/min (by C-G formula based on Cr of 1.14).  Assessment: 88yof on coumadin pta for afib, admitted after RHC for diuresis. INR on admit was below goal at 1.74. Home dose: 2mg  daily except 4mg  on Thur/Sat  INR currently 2.2. Hgb and plts stable- no bleeding noted.  Goal of Therapy:  INR 2-3 Monitor platelets by anticoagulation protocol: Yes   Plan:  -warfarin 6mg  po x1 tonight -daily INR -follow for bleeding   Hughes Better, PharmD, BCPS Clinical Pharmacist Pager: [phone] 08/13/2015 2:49 PM

## 2015-08-13 NOTE — Progress Notes (Signed)
Patient ID: [name], female   DOB: 1926/10/12, 81 y.o.   MRN: [id number] Advanced Heart Failure Rounding Note   Subjective:   Admitted after RHC with volume overload. Started on lasix drip.   Breathing doing better, doing some walking.  Diuresis slowed a bit yesterday, creatinine stable.  Weight still down 4 more pounds.   RHC 08/10/15  RA mean 29 RV 73/24 PA 74/33, mean 45 PCWP mean 33 Oxygen saturations: PA 49% AO 98% Cardiac Output (Fick) 5.12  Cardiac Index (Fick) 2.27 PVR 2.3 WU   Objective:   Weight Range:  Vital Signs:   Temp:  [98.1 F (36.7 C)-99.1 F (37.3 C)] 98.1 F (36.7 C) (09/05 0652) Pulse Rate:  [97-115] 97 (09/05 0652) Resp:  [19-20] 19 (09/05 0652) BP: (90-123)/(38-61) 123/59 mmHg (09/05 0652) SpO2:  [100 %] 100 % (09/05 3976) Weight:  [251 lb 8 oz (114.08 kg)] 251 lb 8 oz (114.08 kg) (09/05 0652) Last BM Date: 08/09/15  Weight change: Filed Weights   08/11/15 0625 08/12/15 0521 08/13/15 0652  Weight: 252 lb 3.2 oz (114.397 kg) 255 lb 1.6 oz (115.713 kg) 251 lb 8 oz (114.08 kg)    Intake/Output:   Intake/Output Summary (Last 24 hours) at 08/13/15 0850 Last data filed at 08/13/15 0700  Gross per 24 hour  Intake 886.67 ml  Output   2125 ml  Net -1238.33 ml     Physical Exam: General:  Chronically ill appearing. No resp difficulty. In bed  HEENT: normal Neck: supple. JVP to jaw . Carotids 2+ bilat; no bruits. No lymphadenopathy or thryomegaly appreciated. Cor: PMI nondisplaced. Regular rate & rhythm. No rubs, gallops or murmurs. Lungs: Decreased in bases. On 2 liters [place]  Abdomen: obese, soft, nontender, nondistended. No hepatosplenomegaly. No bruits or masses. Good bowel sounds. Extremities: no cyanosis, clubbing, rash.  SCDs in place.  1+ edema to knees bilaterally.  Neuro: alert & orientedx3, cranial nerves grossly intact. moves all 4 extremities w/o difficulty. Affect pleasant  Telemetry: A fib 80s   Labs: Basic Metabolic  Panel:  Recent Labs Lab 08/09/15 0624 08/10/15 0414 08/11/15 0337 08/12/15 0410  NA 141 138 136 137  K 4.9 5.1 3.8 4.3  CL 91* 89* 85* 86*  CO2 44* 41* 46* 46*  GLUCOSE 141* 145* 174* 151*  BUN 17 14 12 11   CREATININE 1.22* 1.12* 1.03* 1.14*  CALCIUM 8.9 8.6* 8.5* 8.4*    Liver Function Tests: No results for input(s): AST, ALT, ALKPHOS, BILITOT, PROT, ALBUMIN in the last 168 hours. No results for input(s): LIPASE, AMYLASE in the last 168 hours. No results for input(s): AMMONIA in the last 168 hours.  CBC:  Recent Labs Lab 08/09/15 0624 08/10/15 1041 08/11/15 0337  WBC 6.2 5.4 6.6  HGB 8.8* 9.1* 9.4*  HCT 30.6* 31.9* 31.7*  MCV 104.1* 103.2* 103.6*  PLT 131* 132* 120*    Cardiac Enzymes: No results for input(s): CKTOTAL, CKMB, CKMBINDEX, TROPONINI in the last 168 hours.  BNP: BNP (last 3 results)  Recent Labs  05/09/15 0930 08/03/15 1314 08/09/15 1155  BNP 661.2* 570.3* 561.8*    ProBNP (last 3 results)  Recent Labs  06/08/15 1619  PROBNP 370.0*      Other results:  Imaging: No results found.   Medications:     Scheduled Medications: . acetaZOLAMIDE  250 mg Oral Daily  . atorvastatin  20 mg Oral Daily  . gabapentin  300 mg Oral QHS  . metoprolol tartrate  12.5 mg Oral  BID  . pantoprazole  40 mg Oral Daily  . potassium chloride  40 mEq Oral Daily  . sodium chloride  3 mL Intravenous Q12H  . sodium chloride  3 mL Intravenous Q12H  . Warfarin - Pharmacist Dosing Inpatient   Does not apply q1800    Infusions: . furosemide (LASIX) infusion 10 mg/hr (08/12/15 1404)    PRN Medications: sodium chloride, sodium chloride, acetaminophen, ondansetron (ZOFRAN) IV, sodium chloride, sodium chloride   Assessment/Plan    81 yo with OHS and diastolic CHF admitted with marked volume overload.  1. Acute on chronic diastolic CHF: Restrictive picture on RHC from 9/1.  Markedly elevated filling pressures.  Still very volume overloaded. Creatinine  stable.  Echo this admission with normal EF, moderate RV dilation/moderate RV dysfunction.  - Continue Lasix gtt, increase to 15 mg/hr.  - Hold amlodipine and losartan with soft BP.   - With elevated HCO3 (likely related in part to OHS), will add acetazolamide.  2. Pulmonary hypertension: Think this is pulmonary venous hypertension that will need to be treated with diuresis.  3. Atrial fibrillation: Stable, continue coumadin.  4. OHS: Suspect OHS, she is on home oxygen.  5. Mitral regurgitation: Echo with admission with only trivial MR.  6. Will need to work with PT.  Loralie Champagne 08/13/2015 8:50 AM

## 2015-08-14 LAB — BASIC METABOLIC PANEL
Anion gap: 6 (ref 5–15)
BUN: 12 mg/dL (ref 6–20)
CALCIUM: 8.3 mg/dL — AB (ref 8.9–10.3)
CHLORIDE: 85 mmol/L — AB (ref 101–111)
CO2: 45 mmol/L — ABNORMAL HIGH (ref 22–32)
CREATININE: 1.08 mg/dL — AB (ref 0.44–1.00)
GFR, EST AFRICAN AMERICAN: 52 mL/min — AB (ref >60)
GFR, EST NON AFRICAN AMERICAN: 45 mL/min — AB (ref >60)
Glucose, Bld: 153 mg/dL — ABNORMAL HIGH (ref 65–99)
Potassium: 4.1 mmol/L (ref 3.5–5.1)
SODIUM: 136 mmol/L (ref 135–145)

## 2015-08-14 LAB — PROTIME-INR
INR: 2.27 — ABNORMAL HIGH (ref 0.00–1.49)
PROTHROMBIN TIME: 24.8 s — AB (ref 11.6–15.2)

## 2015-08-14 MED ORDER — WARFARIN SODIUM 4 MG PO TABS
2.0000 mg | ORAL_TABLET | Freq: Once | ORAL | Status: AC
  Administered 2015-08-14: 2 mg via ORAL
  Filled 2015-08-14: qty 0.5

## 2015-08-14 NOTE — Progress Notes (Signed)
Occupational Therapy Treatment Patient Details Name: [name] MRN: [id number] DOB: [birth year] Today's Date: 08/14/2015    History of present illness 81 yo with chronic atrial fibrillation on coumadin, chronic diastolic CHF, and OHS presents for cardiology evaluation. She has had several recent admissions. Heart cath 9/1   OT comments  Pt progressing. Increased SOB with increased activity but vitals stable after rest break. Pt would benefit from tub bench to increase safety with bathroom mobility and reduce risk of falls. Recommend follow up with [place].  Follow Up Recommendations  Home health OT;Supervision/Assistance - 24 hour    Equipment Recommendations  Tub/shower bench    Recommendations for Other Services      Precautions / Restrictions Precautions Precautions: Fall Precaution Comments: watch BP and sats Restrictions Weight Bearing Restrictions: No       Mobility Bed Mobility Overal bed mobility: Needs Assistance Bed Mobility: Supine to Sit     Supine to sit: Min assist;HOB elevated     General bed mobility comments: Pt OOB in chair  Transfers Overall transfer level: Needs assistance   Transfers: Sit to/from Stand Sit to Stand: Min guard         General transfer comment: cues for hand placement with trials from bed and recliner with armrests    Balance Overall balance assessment: Needs assistance   Sitting balance-Leahy Scale: Good       Standing balance-Leahy Scale: Poor                     ADL                                         General ADL Comments: Discussed ADL and DME. Pt states her daughter assists her with ADL tasks. Pt would benefit form use of a tub bench to increase safety with funcitonal mobility for ADL      Vision                     Perception     Praxis      Cognition   Behavior During Therapy: [facility] for tasks assessed/performed Overall Cognitive Status: Within Functional  Limits for tasks assessed (most likely baseline)                       Extremity/Trunk Assessment               Exercises General Exercises - Lower Extremity Long Arc Quad: AROM;Seated;Both;15 reps Hip Flexion/Marching: AROM;Seated;Both;15 reps Toe Raises: AROM;Seated;Both;15 reps Heel Raises: AROM;Seated;Both;15 reps   Shoulder Instructions       General Comments      Pertinent Vitals/ Pain       Pain Assessment: No/denies pain  Home Living                                          Prior Functioning/Environment              Frequency Min 2X/week     Progress Toward Goals  OT Goals(current goals can now be found in the care plan section)  Progress towards OT goals: Progressing toward goals  Acute Rehab OT Goals Patient Stated Goal: "return to caring for myself" OT Goal Formulation: With patient Time For Goal  Achievement: 08/26/15 Potential to Achieve Goals: Good ADL Goals Pt Will Perform Grooming: with min guard assist;standing Pt Will Perform Upper Body Bathing: with supervision;with set-up;sitting Pt Will Perform Lower Body Bathing: with min assist;with adaptive equipment;sit to/from stand Pt Will Perform Upper Body Dressing: with set-up;with supervision;sitting Pt Will Perform Lower Body Dressing: with min assist;with adaptive equipment;sit to/from stand Pt Will Transfer to Toilet: with min guard assist;ambulating;regular height toilet;bedside commode;grab bars Pt Will Perform Toileting - Clothing Manipulation and hygiene: with min assist;sit to/from stand;with adaptive equipment  Plan Discharge plan remains appropriate    Co-evaluation                 End of Session     Activity Tolerance Patient tolerated treatment well   Patient Left in chair;with call bell/phone within reach;with family/visitor present   Nurse Communication Mobility status        Time: 8088-1103 OT Time Calculation (min): 14  min  Charges: OT General Charges $OT Visit: 1 Procedure OT Treatments $Self Care/Home Management : 8-22 mins  Demaris Leavell,HILLARY 08/14/2015, 1:21 PM   [facility], OTR/L  [phone] 08/14/2015

## 2015-08-14 NOTE — Progress Notes (Signed)
Physical Therapy Treatment Patient Details Name: [name] MRN: [id number] DOB: [birth year] Today's Date: 08/14/2015    History of Present Illness 81 yo with chronic atrial fibrillation on coumadin, chronic diastolic CHF, and OHS presents for cardiology evaluation. She has had several recent admissions. Heart cath 9/1    PT Comments    Pt with improved activity tolerance today with BP 11/53 MAP of 65 before activity, HR 92 and BP 109/46 MAP 68 and HR 124 after limited gait. Pt does still desaturate to 80% with limited gait on 3L but able to recover to 92% within 30 sec of seated rest. Pt educated for HEP, transfers and gait and no presyncopal events today. Will continue to follow.   Follow Up Recommendations  Home health PT;Supervision for mobility/OOB     Equipment Recommendations       Recommendations for Other Services       Precautions / Restrictions Precautions Precautions: Fall Precaution Comments: watch BP and sats Restrictions Weight Bearing Restrictions: No    Mobility  Bed Mobility Overal bed mobility: Needs Assistance Bed Mobility: Supine to Sit     Supine to sit: Min assist;HOB elevated     General bed mobility comments: cues for sequence with assist of rail, min assist with pad to bring hips fully to EOB and increased time  Transfers Overall transfer level: Needs assistance   Transfers: Sit to/from Stand Sit to Stand: Min guard         General transfer comment: cues for hand placement with trials from bed and recliner with armrests  Ambulation/Gait Ambulation/Gait assistance: Min guard Ambulation Distance (Feet): 14 Feet Assistive device: Rolling walker (2 wheeled) Gait Pattern/deviations: Step-through pattern;Decreased stride length;Wide base of support;Trunk flexed   Gait velocity interpretation: Below normal speed for age/gender General Gait Details: cues for posture and position in RW. Pt able to ambulate 14' x 2 trials with increased  endurance but desaturation to 80% on 3L with gait    Stairs            Wheelchair Mobility    Modified Rankin (Stroke Patients Only)       Balance Overall balance assessment: Needs assistance   Sitting balance-Leahy Scale: Good       Standing balance-Leahy Scale: Poor                      Cognition Arousal/Alertness: Awake/alert Behavior During Therapy: WFL for tasks assessed/performed Overall Cognitive Status: Within Functional Limits for tasks assessed                      Exercises General Exercises - Lower Extremity Long Arc Quad: AROM;Seated;Both;15 reps Hip Flexion/Marching: AROM;Seated;Both;15 reps Toe Raises: AROM;Seated;Both;15 reps Heel Raises: AROM;Seated;Both;15 reps    General Comments        Pertinent Vitals/Pain Pain Assessment: No/denies pain    Home Living                      Prior Function            PT Goals (current goals can now be found in the care plan section) Progress towards PT goals: Progressing toward goals    Frequency       PT Plan Current plan remains appropriate    Co-evaluation             End of Session Equipment Utilized During Treatment: Gait belt;Oxygen Activity Tolerance: Patient tolerated treatment well Patient left: in  chair;with call bell/phone within reach;with family/visitor present;with nursing/sitter in room     Time: 1042-1112 PT Time Calculation (min) (ACUTE ONLY): 30 min  Charges:  $Gait Training: 8-22 mins $Therapeutic Exercise: 8-22 mins                    G Codes:      Melford Aase 08/16/2015, 12:37 PM Elwyn Reach, [place]

## 2015-08-14 NOTE — Patient Outreach (Signed)
[facility]) Care Management  08/14/2015  ESTALENE BERGEY 20-Nov-1926 [id number]   Referral from Natividad Brood, RN, assigned Valente David, RN.  Thanks, Ronnell Freshwater. [place], [place] Assistant Phone: [phone] Fax: [phone]

## 2015-08-14 NOTE — Progress Notes (Signed)
Patient's daughter requested DM be paged - she was instructed to notify Cardiology MD when she had arrived. PA notified.

## 2015-08-14 NOTE — Progress Notes (Signed)
ANTICOAGULATION CONSULT NOTE - Follow Up Consult  Pharmacy Consult for Coumadin Indication: atrial fibrillation  Allergies  Allergen Reactions  . Peach [Prunus Persica] Rash    AGENT: peach fuzz    Patient Measurements: Height: 5\' 4"  (162.6 cm) Weight: 244 lb 4.8 oz (110.814 kg) IBW/kg (Calculated) : 54.7  Vital Signs: Temp: 98 F (36.7 C) (09/06 0700) Temp Source: Axillary (09/06 0700) BP: 108/55 mmHg (09/06 0700) Pulse Rate: 85 (09/06 0700)  Labs:  Recent Labs  08/12/15 0410 08/13/15 0310 08/14/15 0338  LABPROT 21.3* 24.6* 24.8*  INR 1.86* 2.25* 2.27*  CREATININE 1.14*  --  1.08*    Estimated Creatinine Clearance: 45.5 mL/min (by C-G formula based on Cr of 1.08).  Assessment: 88yof on coumadin pta for afib, admitted after RHC for diuresis. INR on admit was below goal at 1.74 but had los of volume on board - probably impaired absorption.  Home dose: 2mg  daily except 4mg  on Thur/Sat  INR currently 2.27 - after 3 days of Warfarin 6mg  - would expect INR to still be increasing - will back down slightly.  Hgb and plts stable- no bleeding noted.  Goal of Therapy:  INR 2-3 Monitor platelets by anticoagulation protocol: Yes   Plan:  -warfarin 2mg  po x1 tonight -daily INR -follow for bleeding   Bonnita Nasuti Pharm.D. CPP, BCPS Clinical Pharmacist [phone] 08/14/2015 7:51 AM

## 2015-08-14 NOTE — Progress Notes (Signed)
Patient ID: [name], female   DOB: 1926/05/28, 81 y.o.   MRN: [id number] Advanced Heart Failure Rounding Note   Subjective:   Admitted after RHC with volume overload. Started on lasix drip.   Breathing continues to improve.  Walking a little. Diuresed very well on increased lasix gtt 15 and acetazolamide. Out 3.6L and down 7 lbs. Creatinine stable.    RHC 08/10/15  RA mean 29 RV 73/24 PA 74/33, mean 45 PCWP mean 33 Oxygen saturations: PA 49% AO 98% Cardiac Output (Fick) 5.12  Cardiac Index (Fick) 2.27 PVR 2.3 WU   Objective:   Weight Range:  Vital Signs:   Temp:  [98 F (36.7 C)-98.8 F (37.1 C)] 98 F (36.7 C) (09/06 0700) Pulse Rate:  [85-100] 85 (09/06 0700) Resp:  [18-19] 18 (09/06 0700) BP: (98-115)/(53-55) 108/55 mmHg (09/06 0700) SpO2:  [100 %] 100 % (09/06 0700) Weight:  [244 lb 4.8 oz (110.814 kg)] 244 lb 4.8 oz (110.814 kg) (09/06 0700) Last BM Date: 08/09/15  Weight change: Filed Weights   08/12/15 0521 08/13/15 0652 08/14/15 0700  Weight: 255 lb 1.6 oz (115.713 kg) 251 lb 8 oz (114.08 kg) 244 lb 4.8 oz (110.814 kg)    Intake/Output:   Intake/Output Summary (Last 24 hours) at 08/14/15 0737 Last data filed at 08/14/15 1448  Gross per 24 hour  Intake 1297.17 ml  Output   5445 ml  Net -4147.83 ml     Physical Exam: General:  Chronically ill appearing. No resp difficulty. In bed  HEENT: normal Neck: supple. JVP 12-14 . Carotids 2+ bilat; no bruits. No lymphadenopathy or thryomegaly noted. Cor: PMI nondisplaced. Irregularly irregular. No rubs, gallops or murmurs appreciated. Lungs: Decreased bibasilarly. On 2 liters [place]  Abdomen: obese, soft, nontender, nondistended. No HSM. No bruits or masses. +BS Extremities: no cyanosis, clubbing, rash.  SCDs in place.  1+ edema to knees bilaterally.  Neuro: alert & orientedx3, cranial nerves grossly intact. moves all 4 extremities w/o difficulty. Affect pleasant  Telemetry: A fib 80s   Labs: Basic  Metabolic Panel:  Recent Labs Lab 08/09/15 0624 08/10/15 0414 08/11/15 0337 08/12/15 0410 08/14/15 0338  NA 141 138 136 137 136  K 4.9 5.1 3.8 4.3 4.1  CL 91* 89* 85* 86* 85*  CO2 44* 41* 46* 46* 45*  GLUCOSE 141* 145* 174* 151* 153*  BUN 17 14 12 11 12   CREATININE 1.22* 1.12* 1.03* 1.14* 1.08*  CALCIUM 8.9 8.6* 8.5* 8.4* 8.3*    Liver Function Tests: No results for input(s): AST, ALT, ALKPHOS, BILITOT, PROT, ALBUMIN in the last 168 hours. No results for input(s): LIPASE, AMYLASE in the last 168 hours. No results for input(s): AMMONIA in the last 168 hours.  CBC:  Recent Labs Lab 08/09/15 0624 08/10/15 1041 08/11/15 0337  WBC 6.2 5.4 6.6  HGB 8.8* 9.1* 9.4*  HCT 30.6* 31.9* 31.7*  MCV 104.1* 103.2* 103.6*  PLT 131* 132* 120*    Cardiac Enzymes: No results for input(s): CKTOTAL, CKMB, CKMBINDEX, TROPONINI in the last 168 hours.  BNP: BNP (last 3 results)  Recent Labs  05/09/15 0930 08/03/15 1314 08/09/15 1155  BNP 661.2* 570.3* 561.8*    ProBNP (last 3 results)  Recent Labs  06/08/15 1619  PROBNP 370.0*      Other results:  Imaging: No results found.   Medications:     Scheduled Medications: . acetaZOLAMIDE  250 mg Oral Daily  . atorvastatin  20 mg Oral Daily  . gabapentin  300  mg Oral QHS  . metoprolol tartrate  12.5 mg Oral BID  . pantoprazole  40 mg Oral Daily  . potassium chloride  40 mEq Oral Daily  . sodium chloride  3 mL Intravenous Q12H  . sodium chloride  3 mL Intravenous Q12H  . Warfarin - Pharmacist Dosing Inpatient   Does not apply q1800    Infusions: . furosemide (LASIX) infusion 15 mg/hr (08/13/15 1628)    PRN Medications: sodium chloride, sodium chloride, acetaminophen, ondansetron (ZOFRAN) IV, sodium chloride, sodium chloride   Assessment/Plan    81 yo with OHS and diastolic CHF admitted with marked volume overload.  1. Acute on chronic diastolic CHF: Echo this admission with normal EF, moderate RV  dilation/moderate RV dysfunction.  - Restrictive picture on RHC from 9/1 with markedly elevated filling pressures.  - Still significantly volume overloaded.  - Weight 219 in June. >260 on admit.  Creatinine stable.   - Echo this admission with normal EF, moderate RV dilation/moderate RV dysfunction.  - Continue Lasix gtt 15 mg/hr.  - Hold amlodipine and losartan. BP slightly better today.  - With elevated HCO3 (likely related in part to OHS), continue acetazolamide.  2. Pulmonary hypertension: Likely pulmonary venous hypertension that will need to be treated with diuresis.  3. Atrial fibrillation: Stable, continue coumadin.  4. OHS: Suspect OHS, she is on home oxygen.  5. Mitral regurgitation: Echo with admission with only trivial MR.  6. Mobility: PT to eval and treat  Shirley Friar, PA-C 08/14/2015 7:37 AM  Advanced Heart Failure Team Pager [phone] (M-F; 7a - 4p)  Please contact [facility] for night-coverage after hours (4p -7a ) and weekends on [email or web address]  Patient seen with PA, agree with the above note.  Good diuresis yesterday.  Creatinine and HCO3 stable.  Still volume overloaded.  Continue current Lasix gtt and acetazolamide, suspect couple more days.  Breathing is improving.   Loralie Champagne 08/14/2015 8:23 AM

## 2015-08-14 NOTE — Consult Note (Signed)
   [facility] CM Inpatient Consult   08/14/2015  [name] 1926-11-20 [id number] Patient evaluated for community based chronic disease management services with [facility] as a benefit of patient's [employer] Medicare Insurance. Met with patient and her daughter [name] [phone]  at bedside to explain [facility] services. Patient and daughter endorses that Dr. Glendale Chard is her primary care provider. Patient lives with [name].   Consent form signed and [facility] folder with information was given to her daughter.   Patient will receive post discharge transition of care call and will be evaluated for monthly home visits for assessments and disease process education.  Left contact information and [facility] literature at bedside. Made Inpatient Case Manager aware that [facility] following. Of note, [facility] Management services does not replace or interfere with any services that are arranged by inpatient case management or social work.  For additional questions or referrals please contact:   Natividad Brood, RN BSN [facility] Liaison  [phone] business mobile phone

## 2015-08-15 LAB — BASIC METABOLIC PANEL
Anion gap: 9 (ref 5–15)
BUN: 12 mg/dL (ref 6–20)
CHLORIDE: 85 mmol/L — AB (ref 101–111)
CO2: 42 mmol/L — ABNORMAL HIGH (ref 22–32)
CREATININE: 1.09 mg/dL — AB (ref 0.44–1.00)
Calcium: 8.6 mg/dL — ABNORMAL LOW (ref 8.9–10.3)
GFR calc non Af Amer: 45 mL/min — ABNORMAL LOW (ref >60)
GFR, EST AFRICAN AMERICAN: 52 mL/min — AB (ref >60)
Glucose, Bld: 154 mg/dL — ABNORMAL HIGH (ref 65–99)
POTASSIUM: 3.9 mmol/L (ref 3.5–5.1)
SODIUM: 136 mmol/L (ref 135–145)

## 2015-08-15 LAB — CBC
HCT: 33.1 % — ABNORMAL LOW (ref 36.0–46.0)
Hemoglobin: 10.1 g/dL — ABNORMAL LOW (ref 12.0–15.0)
MCH: 30.7 pg (ref 26.0–34.0)
MCHC: 30.5 g/dL (ref 30.0–36.0)
MCV: 100.6 fL — AB (ref 78.0–100.0)
PLATELETS: 141 10*3/uL — AB (ref 150–400)
RBC: 3.29 MIL/uL — AB (ref 3.87–5.11)
RDW: 13.6 % (ref 11.5–15.5)
WBC: 7.6 10*3/uL (ref 4.0–10.5)

## 2015-08-15 LAB — PROTIME-INR
INR: 2.38 — ABNORMAL HIGH (ref 0.00–1.49)
Prothrombin Time: 25.7 seconds — ABNORMAL HIGH (ref 11.6–15.2)

## 2015-08-15 MED ORDER — WARFARIN SODIUM 4 MG PO TABS
2.0000 mg | ORAL_TABLET | Freq: Once | ORAL | Status: AC
  Administered 2015-08-15: 2 mg via ORAL
  Filled 2015-08-15: qty 0.5

## 2015-08-15 NOTE — Progress Notes (Signed)
ANTICOAGULATION CONSULT NOTE - Follow Up Consult  Pharmacy Consult for Coumadin Indication: atrial fibrillation  Allergies  Allergen Reactions  . Peach [Prunus Persica] Rash    AGENT: peach fuzz    Patient Measurements: Height: 5\' 4"  (162.6 cm) Weight: 237 lb 12.8 oz (107.865 kg) (bedscale) IBW/kg (Calculated) : 54.7  Vital Signs: Temp: 98.4 F (36.9 C) (09/07 0435) Temp Source: Oral (09/07 0435) BP: 107/68 mmHg (09/07 0435) Pulse Rate: 96 (09/07 0435)  Labs:  Recent Labs  08/13/15 0310 08/14/15 0338 08/15/15 0650  HGB  --   --  10.1*  HCT  --   --  33.1*  PLT  --   --  141*  LABPROT 24.6* 24.8* 25.7*  INR 2.25* 2.27* 2.38*  CREATININE  --  1.08* 1.09*    Estimated Creatinine Clearance: 44.4 mL/min (by C-G formula based on Cr of 1.09).  Assessment: 88yof on coumadin pta for afib, admitted after RHC for diuresis. INR on admit was below goal at 1.74 but had lots of volume on board - probably impaired absorption.  Home dose: 2mg  daily except 4mg  on Thur/Sat  INR currently 2.3 - would expect INR to now be stabilizing. Will try to transition back to home dose now. Hgb and plts stable- no bleeding noted.  Goal of Therapy:  INR 2-3 Monitor platelets by anticoagulation protocol: Yes   Plan:  -warfarin 2mg  po x1 tonight -daily INR -follow for bleeding  Erin Hearing PharmD., BCPS Clinical Pharmacist Pager [phone] 08/15/2015 8:45 AM

## 2015-08-15 NOTE — Progress Notes (Signed)
HHC arranged with [facility] as requested last week. Mindi Slicker Rn,MHA,BSN [phone]

## 2015-08-15 NOTE — Progress Notes (Signed)
Heart Failure Navigator Consult Note  Presentation: [name] is a 81 yo with chronic atrial fibrillation on coumadin, chronic diastolic CHF, and OHS presents for cardiology evaluation. She has had several recent admissions. In 6/16, she had syncope while sitting on the toilet. She came to the ER. Troponin was 0.21 and temperature was up to 101. She was admitted and ended up having cardiac cath showing no obstructive CAD but 3+ MR. Echo showed normal EF with grade III diastolic dysfunction, severe pulmonary hypertension, and per report only trivial MR. She was re-admitted later in 6/16 after a fall and syncope again. She was found to be profoundly hypoglycemic. She is no longer on diabetes medications.   She was referred to Dr [name] and has been on home oxygen for OHS. She has been wheelchair-bound for the most part since 6/16. She used to live alone prior to 6/16 but now lives with her daughter. The only walking she does now if from her bed to the bathroom. She is dyspneic with any exertion. This is stable. She has orthopnea and sleeps almost sitting up. No chest pain. BP has been fluctuating a lot and is very high today. She has been wearing compression stockings. She thinks that she has gained > 50 lbs since 6/16. She has been coughing  Past Medical History  Diagnosis Date  . 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 DVT (deep venous thrombosis)   . Arthritis   . Hyperlipidemia   . Hypertension   . CHF (congestive heart failure)   . Anemia   . Cholelithiasis   . GI bleed   . Breast cancer 2012    right  . Peripheral neuropathy   . Heart murmur   . Syncope 05/09/2015  . Shortness of breath dyspnea   . Diabetes mellitus   . GERD (gastroesophageal reflux disease)     Social History   Social History  . Marital Status: Widowed    Spouse Name: N/A  . Number of Children: 4  . Years of Education: N/A    Occupational History  . RETIRED    Social History Main Topics  . Smoking status: Never Smoker   . Smokeless tobacco: Never Used  . Alcohol Use: No  . Drug Use: No  . Sexual Activity: Not Currently   Other Topics Concern  . None   Social History Narrative    ECHO:Study Conclusions--08/09/15  - Left ventricle: The cavity size was normal. There was severe concentric hypertrophy. Systolic function was normal. The estimated ejection fraction was in the range of 55% to 60%. Wall motion was normal; there were no regional wall motion abnormalities. Doppler parameters are consistent with high ventricular filling pressure. - Mitral valve: Moderately calcified annulus. Mildly thickened, moderately calcified leaflets . - Left atrium: The atrium was mildly dilated. - Right ventricle: The cavity size was moderately dilated. Wall thickness was normal. Systolic function was moderately reduced. - Right atrium: The atrium was severely dilated. - Tricuspid valve: There was moderate regurgitation. - Pulmonary arteries: Systolic pressure was severely increased. PA peak pressure: 93 mm Hg (S).  Impressions:  - Compared to the prior study, there has been no significant interval change.  Transthoracic echocardiography. M-mode, complete 2D, spectral Doppler, and color Doppler. Birthdate: Patient birthdate: 1925/12/29. Age: Patient is 81 yr old. Sex: Gender: female. BMI: 45.7 kg/m^2. Blood pressure:   122/62 Patient status: Inpatient. Study date: Study date: 08/09/2015. Study  time: 01:38 PM. Location: Bedside.  BNP    Component Value Date/Time   BNP 561.8* 08/09/2015 1155    ProBNP    Component Value Date/Time   PROBNP 370.0* 06/08/2015 1619     Education Assessment and Provision:  Detailed education and instructions provided on heart failure disease management including the following:  Signs and symptoms of Heart Failure When to call the  physician Importance of daily weights Low sodium diet Fluid restriction Medication management Anticipated future follow-up appointments  Patient education given on each of the above topics.  Patient acknowledges understanding and acceptance of all instructions.  I spoke with [name] and her daughter regarding her HF.  Her daughter tells me that someone just left the room and did HF education with them as well.  She was able to teach back most topics listed above.  She does not have a scale and I will provide one for discharge.  We discussed the importance of daily weights and how they relate to the signs and symptoms of HF.  Her daughter tells me that they are working on eating low sodium-- her mother likes "salty potato chips".  She says they have no issues getting or taking prescribed medications.   She will follow with the [facility] after discharge.  Education Materials:  "Living Better With Heart Failure" Booklet, Daily Weight Tracker Tool and Heart Failure Educational Video.   High Risk Criteria for Readmission and/or Poor Patient Outcomes:     EF <30%- no 55-60% with PA pressure 93 mmHg  2 or more admissions in 6 months- Yes  Difficult social situation- No  Demonstrates medication noncompliance- denies    Barriers of Care:  Knowledge of HF for daughter (primary caregiver) and patient, compliance  Discharge Planning:  Plans to return to home with daughter--[place]

## 2015-08-15 NOTE — Progress Notes (Signed)
Patient ID: [name], female   DOB: Nov 08, 1926, 81 y.o.   MRN: [id number] Advanced Heart Failure Rounding Note   Subjective:   Admitted after RHC with volume overload. Started on lasix drip.   Continues to feel better.  Walked around her bed to her chair yesterday without difficulty.  Granddaughter at bedside.  Said they do well at home and she has near 24 hr supervision.  They think HH with PT/OT would be helpful, but do no think she needs a facility at this time.  Continues to diurese well on lasix gtt 15 and acetazolamide. Out 5.3L and down 7 lbs. Creatinine stable. (Out 20 L and down 29 lbs overall so far.)   RHC 08/10/15  RA mean 29 RV 73/24 PA 74/33, mean 45 PCWP mean 33 Oxygen saturations: PA 49% AO 98% Cardiac Output (Fick) 5.12  Cardiac Index (Fick) 2.27 PVR 2.3 WU   Objective:   Weight Range:  Vital Signs:   Temp:  [98.4 F (36.9 C)-99.1 F (37.3 C)] 98.4 F (36.9 C) (09/07 0435) Pulse Rate:  [92-96] 96 (09/07 0435) Resp:  [16-20] 16 (09/07 0435) BP: (107-118)/(46-70) 107/68 mmHg (09/07 0435) SpO2:  [100 %] 100 % (09/07 0435) Weight:  [237 lb 12.8 oz (107.865 kg)] 237 lb 12.8 oz (107.865 kg) (09/07 0435) Last BM Date: 08/12/15  Weight change: Filed Weights   08/13/15 0652 08/14/15 0700 08/15/15 0435  Weight: 251 lb 8 oz (114.08 kg) 244 lb 4.8 oz (110.814 kg) 237 lb 12.8 oz (107.865 kg)    Intake/Output:   Intake/Output Summary (Last 24 hours) at 08/15/15 0841 Last data filed at 08/15/15 0700  Gross per 24 hour  Intake    788 ml  Output   5650 ml  Net  -4862 ml     Physical Exam: General:  Chronically ill appearing. No resp difficulty. In bed  HEENT: normal Neck: supple. JVP 9-10. Carotids 2+ bilat; no bruits. No lymphadenopathy or thryomegaly. Cor: PMI nondisplaced. Irregularly irregular. No rubs, gallops or murmurs . Lungs: Decreased bibasilarly. On 2 liters [place]  Abdomen: obese, soft, NT, ND. No hepatosplenomegaly. No bruits or masses. Good  bowel sounds. Extremities: no cyanosis, clubbing, rash.  1+ edema to knees. SCDs in place.   Neuro: alert & orientedx3, cranial nerves grossly intact. moves all 4 extremities w/o difficulty. Affect pleasant  Telemetry: A fib 90s, into the 100s several times.  Labs: Basic Metabolic Panel:  Recent Labs Lab 08/10/15 0414 08/11/15 0337 08/12/15 0410 08/14/15 0338 08/15/15 0650  NA 138 136 137 136 136  K 5.1 3.8 4.3 4.1 3.9  CL 89* 85* 86* 85* 85*  CO2 41* 46* 46* 45* 42*  GLUCOSE 145* 174* 151* 153* 154*  BUN 14 12 11 12 12   CREATININE 1.12* 1.03* 1.14* 1.08* 1.09*  CALCIUM 8.6* 8.5* 8.4* 8.3* 8.6*    Liver Function Tests: No results for input(s): AST, ALT, ALKPHOS, BILITOT, PROT, ALBUMIN in the last 168 hours. No results for input(s): LIPASE, AMYLASE in the last 168 hours. No results for input(s): AMMONIA in the last 168 hours.  CBC:  Recent Labs Lab 08/09/15 0624 08/10/15 1041 08/11/15 0337 08/15/15 0650  WBC 6.2 5.4 6.6 7.6  HGB 8.8* 9.1* 9.4* 10.1*  HCT 30.6* 31.9* 31.7* 33.1*  MCV 104.1* 103.2* 103.6* 100.6*  PLT 131* 132* 120* 141*    Cardiac Enzymes: No results for input(s): CKTOTAL, CKMB, CKMBINDEX, TROPONINI in the last 168 hours.  BNP: BNP (last 3 results)  Recent Labs  05/09/15 0930 08/03/15 1314 08/09/15 1155  BNP 661.2* 570.3* 561.8*    ProBNP (last 3 results)  Recent Labs  06/08/15 1619  PROBNP 370.0*      Other results:  Imaging: No results found.   Medications:     Scheduled Medications: . acetaZOLAMIDE  250 mg Oral Daily  . atorvastatin  20 mg Oral Daily  . gabapentin  300 mg Oral QHS  . metoprolol tartrate  12.5 mg Oral BID  . pantoprazole  40 mg Oral Daily  . potassium chloride  40 mEq Oral Daily  . sodium chloride  3 mL Intravenous Q12H  . sodium chloride  3 mL Intravenous Q12H  . Warfarin - Pharmacist Dosing Inpatient   Does not apply q1800    Infusions: . furosemide (LASIX) infusion 15 mg/hr (08/15/15 0700)     PRN Medications: sodium chloride, sodium chloride, acetaminophen, ondansetron (ZOFRAN) IV, sodium chloride, sodium chloride   Assessment/Plan    81 yo with OHS and diastolic CHF admitted with marked volume overload.   1. Acute on chronic diastolic CHF: Echo this admission with normal EF, moderate RV dilation/moderate RV dysfunction.  - Restrictive picture on RHC from 9/1 with markedly elevated filling pressures.   - Still with significant volume overload.  - Weight 219 in June. 237 today. (>260 on admit)  Creatinine stable.   - Echo this admission with normal EF, moderate RV dilation/moderate RV dysfunction.  - Continue Lasix gtt 15 mg/hr.  - Hold amlodipine and losartan. BP slightly better today.  - HCO3 remains elevated, continue acetazolamide. (likely related in part to OHS) 2. Pulmonary hypertension: Likely pulmonary venous hypertension that will need to be treated with diuresis.  3. Atrial fibrillation: Stable, continue coumadin.  4. OHS: Suspect OHS, she is on home oxygen.  5. Mitral regurgitation: Echo with admission with only trivial MR.  6. Mobility: PT/OT following  Dispo: She is still up nearly 20 lbs from previous baseline.  PT/OT recommend home health OT/PT with 24 hr supervision. Family do not think she needs a facility at this time, but think [facility] would be very helpful.  Shirley Friar, PA-C 08/15/2015 8:41 AM  Advanced Heart Failure Team Pager [phone] (M-F; 7a - 4p)  Please contact [facility] for night-coverage after hours (4p -7a ) and weekends on [email or web address]  Patient seen with PA, agree with the above note.  She is making progress, continues to lose weight.  Still volume overloaded.  Will continue current Lasix gtt and acetazolamide today.  Creatinine stable. Plan for home with services when she is fully diuresed.   Loralie Champagne 08/15/2015 9:11 AM

## 2015-08-15 NOTE — Progress Notes (Signed)
Physical Therapy Treatment Patient Details Name: RHIANNA RAULERSON MRN: [id number] DOB: 1925/12/22 Today's Date: 08/15/2015    History of Present Illness 81 yo with chronic atrial fibrillation on coumadin, chronic diastolic CHF, and OHS presents for cardiology evaluation. She has had several recent admissions. Heart cath 9/1    PT Comments    Pt tolerating short bouts of walking well. She walked 18' x 2 with RW and 3L O2 [place] and seated rest break. Unable to obtain walking SaO2 due to weak signal/cold fingers.   Follow Up Recommendations  Home health PT;Supervision for mobility/OOB     Equipment Recommendations  None recommended by PT    Recommendations for Other Services       Precautions / Restrictions Precautions Precautions: Fall Precaution Comments: watch BP and sats Restrictions Weight Bearing Restrictions: No    Mobility  Bed Mobility               General bed mobility comments: Pt OOB in chair  Transfers Overall transfer level: Needs assistance Equipment used: Rolling walker (2 wheeled) Transfers: Sit to/from Stand Sit to Stand: Min assist         General transfer comment: cues for hand placement with trials from [facility] and recliner with armrests, min A for anterior translation and to steady initially upon standing  Ambulation/Gait Ambulation/Gait assistance: Min guard Ambulation Distance (Feet): 18 Feet (18' x 2 with seated rest break) Assistive device: Rolling walker (2 wheeled)     Gait velocity interpretation: Below normal speed for age/gender General Gait Details: 54' x 2 with RW with seated rest break, SaO2 97% on 3L O2 at rest, could not get pulse ox reading with walking due to poor signal, 2/4 dyspnea with walking, no LOB   Stairs            Wheelchair Mobility    Modified Rankin (Stroke Patients Only)       Balance     Sitting balance-Leahy Scale: Good       Standing balance-Leahy Scale: Poor Standing balance comment:  relies on BUE support                    Cognition Arousal/Alertness: Awake/alert Behavior During Therapy: WFL for tasks assessed/performed Overall Cognitive Status: Within Functional Limits for tasks assessed                      Exercises      General Comments        Pertinent Vitals/Pain Pain Assessment: No/denies pain    Home Living                      Prior Function            PT Goals (current goals can now be found in the care plan section) Acute Rehab PT Goals Patient Stated Goal: "return to caring for myself" PT Goal Formulation: With patient/family Time For Goal Achievement: 08/24/15 Potential to Achieve Goals: Fair Progress towards PT goals: Progressing toward goals    Frequency       PT Plan Current plan remains appropriate    Co-evaluation             End of Session Equipment Utilized During Treatment: Gait belt;Oxygen Activity Tolerance: Patient tolerated treatment well Patient left: in chair;with call bell/phone within reach;with family/visitor present;with nursing/sitter in room     Time: 3354-5625 PT Time Calculation (min) (ACUTE ONLY): 22 min  Charges:  $Gait  Training: 8-22 mins                    G Codes:      Philomena Doheny 08/15/2015, 12:48 PM [phone]

## 2015-08-15 NOTE — Progress Notes (Signed)
CSW received order to assist patient with SNF placement- however- review of chart and PT/OT recommendations are for home health services.  Patient lives with her daughter. CSW will sign off for now but will be available should any CSW needs arise.  Lorie Phenix. Pauline Good, [place]

## 2015-08-16 LAB — BASIC METABOLIC PANEL
ANION GAP: 6 (ref 5–15)
BUN: 14 mg/dL (ref 6–20)
CALCIUM: 8.5 mg/dL — AB (ref 8.9–10.3)
CO2: 43 mmol/L — AB (ref 22–32)
CREATININE: 1.02 mg/dL — AB (ref 0.44–1.00)
Chloride: 86 mmol/L — ABNORMAL LOW (ref 101–111)
GFR, EST AFRICAN AMERICAN: 56 mL/min — AB (ref >60)
GFR, EST NON AFRICAN AMERICAN: 48 mL/min — AB (ref >60)
Glucose, Bld: 166 mg/dL — ABNORMAL HIGH (ref 65–99)
Potassium: 3.6 mmol/L (ref 3.5–5.1)
SODIUM: 135 mmol/L (ref 135–145)

## 2015-08-16 LAB — PROTIME-INR
INR: 2.29 — AB (ref 0.00–1.49)
PROTHROMBIN TIME: 25 s — AB (ref 11.6–15.2)

## 2015-08-16 MED ORDER — ACETAZOLAMIDE 250 MG PO TABS
250.0000 mg | ORAL_TABLET | Freq: Two times a day (BID) | ORAL | Status: DC
  Administered 2015-08-16: 250 mg via ORAL
  Filled 2015-08-16 (×2): qty 1

## 2015-08-16 MED ORDER — POTASSIUM CHLORIDE CRYS ER 20 MEQ PO TBCR
40.0000 meq | EXTENDED_RELEASE_TABLET | Freq: Once | ORAL | Status: AC
  Administered 2015-08-16: 40 meq via ORAL
  Filled 2015-08-16: qty 2

## 2015-08-16 MED ORDER — WARFARIN SODIUM 4 MG PO TABS
4.0000 mg | ORAL_TABLET | Freq: Once | ORAL | Status: AC
  Administered 2015-08-16: 4 mg via ORAL
  Filled 2015-08-16: qty 1

## 2015-08-16 NOTE — Progress Notes (Signed)
Occupational Therapy Treatment Patient Details Name: [name] MRN: [id number] DOB: Nov 06, 1926 Today's Date: 08/16/2015    History of present illness 81 yo with chronic atrial fibrillation on coumadin, chronic diastolic CHF, and OHS presents for cardiology evaluation. She has had several recent admissions. Heart cath 9/1   OT comments  Pt demonstrates improving endurance/activity tolerance.  Daughter wishes for pt to get tub transfer bench and is able to verbalize safe technique for transfer.   Follow Up Recommendations  Home health OT;Supervision/Assistance - 24 hour    Equipment Recommendations  Tub/shower bench    Recommendations for Other Services      Precautions / Restrictions Precautions Precautions: Fall       Mobility Bed Mobility Overal bed mobility: Needs Assistance Bed Mobility: Supine to Sit     Supine to sit: Min assist;HOB elevated     General bed mobility comments: Pt rquires min a to scoot Rt hip to the EOB  Transfers Overall transfer level: Needs assistance Equipment used: Rolling walker (2 wheeled) Transfers: Sit to/from [facility] Sit to Stand: Min guard Stand pivot transfers: Min guard            Balance Overall balance assessment: Needs assistance Sitting-balance support: Feet supported Sitting balance-Leahy Scale: Good     Standing balance support: Bilateral upper extremity supported Standing balance-Leahy Scale: Poor                     ADL       Grooming: Wash/dry hands;Wash/dry face;Oral care;Min guard;Standing                   Toilet Transfer: Min guard;Ambulation;Comfort height toilet;BSC;Grab bars;RW           Functional mobility during ADLs: Min guard;Rolling walker General ADL Comments: daughter able to verbalize correct method for use of tub transfer bench       Vision                     Perception     Praxis      Cognition   Behavior During Therapy: WFL  for tasks assessed/performed Overall Cognitive Status: Within Functional Limits for tasks assessed                       Extremity/Trunk Assessment               Exercises     Shoulder Instructions       General Comments      Pertinent Vitals/ Pain       Pain Assessment: No/denies pain  Home Living                                          Prior Functioning/Environment              Frequency Min 2X/week     Progress Toward Goals  OT Goals(current goals can now be found in the care plan section)  Progress towards OT goals: Progressing toward goals  ADL Goals Pt Will Perform Grooming: with min guard assist;standing Pt Will Perform Upper Body Bathing: with supervision;with set-up;sitting Pt Will Perform Lower Body Bathing: with min assist;with adaptive equipment;sit to/from stand Pt Will Perform Upper Body Dressing: with set-up;with supervision;sitting Pt Will Perform Lower Body Dressing: with min assist;with adaptive equipment;sit to/from stand Pt Will Transfer  to Toilet: with min guard assist;ambulating;regular height toilet;bedside commode;grab bars Pt Will Perform Toileting - Clothing Manipulation and hygiene: with min assist;sit to/from stand;with adaptive equipment  Plan Discharge plan remains appropriate    Co-evaluation                 End of Session Equipment Utilized During Treatment: Rolling walker   Activity Tolerance Patient tolerated treatment well   Patient Left in chair;with call bell/phone within reach;with chair alarm set;with family/visitor present   Nurse Communication Mobility status        Time: 6945-0388 OT Time Calculation (min): 29 min  Charges: OT General Charges $OT Visit: 1 Procedure OT Treatments $Therapeutic Activity: 23-37 mins  Uliana Brinker M 08/16/2015, 3:25 PM

## 2015-08-16 NOTE — Care Management Important Message (Signed)
Important Message  Patient Details  Name: [name] MRN: [id number] Date of Birth: 01-19-1926   Medicare Important Message Given:  Yes-third notification given    Delorse Lek 08/16/2015, 1:42 PM

## 2015-08-16 NOTE — Progress Notes (Signed)
ANTICOAGULATION CONSULT NOTE - Follow Up Consult  Pharmacy Consult for Coumadin Indication: atrial fibrillation  Allergies  Allergen Reactions  . Peach [Prunus Persica] Rash    AGENT: peach fuzz    Patient Measurements: Height: 5\' 4"  (162.6 cm) Weight: 227 lb 11.8 oz (103.3 kg) IBW/kg (Calculated) : 54.7  Vital Signs: Temp: 98.3 F (36.8 C) (09/07 2201) Temp Source: Oral (09/07 2201) BP: 104/55 mmHg (09/07 2201) Pulse Rate: 91 (09/07 2201)  Labs:  Recent Labs  08/14/15 0338 08/15/15 0650 08/16/15 0335  HGB  --  10.1*  --   HCT  --  33.1*  --   PLT  --  141*  --   LABPROT 24.8* 25.7* 25.0*  INR 2.27* 2.38* 2.29*  CREATININE 1.08* 1.09* 1.02*    Estimated Creatinine Clearance: 46.3 mL/min (by C-G formula based on Cr of 1.02).  Assessment: 88yof on coumadin pta for afib, admitted after RHC for diuresis. INR on admit was below goal at 1.74 but had lots of volume on board - probably impaired absorption.  Home dose: 2mg  daily except 4mg  on Thur/Sat  INR currently 2.29 - INR appears to now be stabilizing. Transition back to home dose now. Hgb and plts stable- no bleeding noted.  Goal of Therapy:  INR 2-3 Monitor platelets by anticoagulation protocol: Yes   Plan:  -warfarin 4mg  po x1 tonight -daily INR -follow for bleeding  Erin Hearing PharmD., BCPS Clinical Pharmacist Pager [phone] 08/16/2015 8:48 AM

## 2015-08-16 NOTE — Progress Notes (Signed)
Patient ID: [name], female   DOB: [birth year], 81 y.o.   MRN: [id number] Advanced Heart Failure Rounding Note   Subjective:   Admitted after RHC with volume overload. Started on lasix drip.   Continues to feel better each day. Denies SOB. Says she has only been walking around her bed to get into the chair.  Does not feel like she can walk any further due to "weakness in her legs".    Continues to diurese well on lasix gtt 15 and acetazolamide. Out 2.5L and is supposedly down 10 lbs. Creatinine stable. (Out 20 L and down 29 lbs overall so far.)   RHC 08/10/15  RA mean 29 RV 73/24 PA 74/33, mean 45 PCWP mean 33 Oxygen saturations: PA 49% AO 98% Cardiac Output (Fick) 5.12  Cardiac Index (Fick) 2.27 PVR 2.3 WU   Objective:   Weight Range:  Vital Signs:   Temp:  [98.1 F (36.7 C)-98.3 F (36.8 C)] 98.3 F (36.8 C) (09/07 2201) Pulse Rate:  [83-91] 91 (09/07 2201) Resp:  [18-20] 18 (09/07 2201) BP: (102-114)/(48-58) 104/55 mmHg (09/07 2201) SpO2:  [96 %-100 %] 96 % (09/07 2201) Weight:  [227 lb 11.8 oz (103.3 kg)] 227 lb 11.8 oz (103.3 kg) (09/08 0808) Last BM Date: 08/12/15  Weight change: Filed Weights   08/14/15 0700 08/15/15 0435 08/16/15 0808  Weight: 244 lb 4.8 oz (110.814 kg) 237 lb 12.8 oz (107.865 kg) 227 lb 11.8 oz (103.3 kg)    Intake/Output:   Intake/Output Summary (Last 24 hours) at 08/16/15 0911 Last data filed at 08/16/15 0600  Gross per 24 hour  Intake   1100 ml  Output   3650 ml  Net  -2550 ml     Physical Exam: General:  Chronically ill appearing. No resp difficulty. In bed  HEENT: normal Neck: supple. JVP 8-10 with mild HJR. Carotids 2+ bilat; no bruits. No lymphadenopathy or thyromegaly noted. Cor: PMI nondisplaced. Irregularly irregular. No rubs, gallops or murmurs appreciated. Lungs: Diminished bibasilar sounds. On 2 liters [place]  Abdomen: obese, soft, NT, ND. No HSM. No bruits or masses. +BS Extremities: no cyanosis, clubbing, rash.   1-2+ edema to knees. SCDs in place.   Neuro: alert & orientedx3, cranial nerves grossly intact. moves all 4 extremities w/o difficulty. Affect pleasant  Telemetry: A fib 80s  Labs: Basic Metabolic Panel:  Recent Labs Lab 08/11/15 0337 08/12/15 0410 08/14/15 0338 08/15/15 0650 08/16/15 0335  NA 136 137 136 136 135  K 3.8 4.3 4.1 3.9 3.6  CL 85* 86* 85* 85* 86*  CO2 46* 46* 45* 42* 43*  GLUCOSE 174* 151* 153* 154* 166*  BUN 12 11 12 12 14   CREATININE 1.03* 1.14* 1.08* 1.09* 1.02*  CALCIUM 8.5* 8.4* 8.3* 8.6* 8.5*    Liver Function Tests: No results for input(s): AST, ALT, ALKPHOS, BILITOT, PROT, ALBUMIN in the last 168 hours. No results for input(s): LIPASE, AMYLASE in the last 168 hours. No results for input(s): AMMONIA in the last 168 hours.  CBC:  Recent Labs Lab 08/10/15 1041 08/11/15 0337 08/15/15 0650  WBC 5.4 6.6 7.6  HGB 9.1* 9.4* 10.1*  HCT 31.9* 31.7* 33.1*  MCV 103.2* 103.6* 100.6*  PLT 132* 120* 141*    Cardiac Enzymes: No results for input(s): CKTOTAL, CKMB, CKMBINDEX, TROPONINI in the last 168 hours.  BNP: BNP (last 3 results)  Recent Labs  05/09/15 0930 08/03/15 1314 08/09/15 1155  BNP 661.2* 570.3* 561.8*    ProBNP (last 3 results)  Recent Labs  06/08/15 1619  PROBNP 370.0*      Other results:  Imaging: No results found.   Medications:     Scheduled Medications: . acetaZOLAMIDE  250 mg Oral Daily  . atorvastatin  20 mg Oral Daily  . gabapentin  300 mg Oral QHS  . metoprolol tartrate  12.5 mg Oral BID  . pantoprazole  40 mg Oral Daily  . potassium chloride  40 mEq Oral Daily  . potassium chloride  40 mEq Oral Once  . sodium chloride  3 mL Intravenous Q12H  . sodium chloride  3 mL Intravenous Q12H  . warfarin  4 mg Oral ONCE-1800  . Warfarin - Pharmacist Dosing Inpatient   Does not apply q1800    Infusions: . furosemide (LASIX) infusion 15 mg/hr (08/15/15 2341)    PRN Medications: sodium chloride, sodium  chloride, acetaminophen, ondansetron (ZOFRAN) IV, sodium chloride, sodium chloride   Assessment/Plan    81 yo with OHS and diastolic CHF admitted with marked volume overload.   1. Acute on chronic diastolic CHF: Echo this admission with normal EF, moderate RV dilation/moderate RV dysfunction.  - Restrictive picture on RHC from 9/1 with markedly elevated filling pressures.   - Still with volume overload but approaching previous baseline weight. - Weight 219 in June. 227 today. (>260 on admit)  Creatinine continues to improve from admission.   - Echo this admission with normal EF, moderate RV dilation/moderate RV dysfunction.  - Continue Lasix gtt 15 mg/hr.  - Hold amlodipine and losartan. SBPs in 100s currently.  - HCO3 remains elevated, continue acetazolamide. Per pharm could consider increased to 250 mg BID. 2. Pulmonary hypertension: Likely pulmonary venous hypertension that will improve with diuresis.  3. Atrial fibrillation: Stable, continue coumadin.  4. OHS: Suspect OHS, she is on home oxygen.  5. Mitral regurgitation: Echo with admission with only trivial MR.  6. Mobility: PT/OT following  Dispo: Approaching baseline. Home with HH once diuresed fully. HH arranged with [facility].  Shirley Friar, PA-C 08/16/2015 9:11 AM  Advanced Heart Failure Team Pager [phone] (M-F; 7a - 4p)  Please contact [facility] for night-coverage after hours (4p -7a ) and weekends on [email or web address]  Patient seen with PA, agree with the above note.  Will give one more day IV Lasix, plan conversion to po torsemide tomorrow.  Can increase acetazolamide to 250 bid today.    Loralie Champagne 08/16/2015 10:59 AM

## 2015-08-17 LAB — PROTIME-INR
INR: 2.31 — ABNORMAL HIGH (ref 0.00–1.49)
Prothrombin Time: 25.2 seconds — ABNORMAL HIGH (ref 11.6–15.2)

## 2015-08-17 LAB — BASIC METABOLIC PANEL
Anion gap: 8 (ref 5–15)
BUN: 19 mg/dL (ref 6–20)
CO2: 39 mmol/L — ABNORMAL HIGH (ref 22–32)
Calcium: 8.2 mg/dL — ABNORMAL LOW (ref 8.9–10.3)
Chloride: 87 mmol/L — ABNORMAL LOW (ref 101–111)
Creatinine, Ser: 1.16 mg/dL — ABNORMAL HIGH (ref 0.44–1.00)
GFR calc Af Amer: 48 mL/min — ABNORMAL LOW (ref >60)
GFR calc non Af Amer: 41 mL/min — ABNORMAL LOW (ref >60)
Glucose, Bld: 152 mg/dL — ABNORMAL HIGH (ref 65–99)
Potassium: 3.8 mmol/L (ref 3.5–5.1)
Sodium: 134 mmol/L — ABNORMAL LOW (ref 135–145)

## 2015-08-17 MED ORDER — METOPROLOL TARTRATE 25 MG PO TABS: 12.5000 mg | ORAL_TABLET | Freq: Two times a day (BID) | ORAL | Status: DC

## 2015-08-17 MED ORDER — TORSEMIDE 20 MG PO TABS
40.0000 mg | ORAL_TABLET | Freq: Every day | ORAL | Status: DC
  Administered 2015-08-17: 40 mg via ORAL
  Filled 2015-08-17: qty 2

## 2015-08-17 MED ORDER — WARFARIN SODIUM 4 MG PO TABS: 2.0000 mg | ORAL_TABLET | Freq: Once | ORAL | Status: DC

## 2015-08-17 MED ORDER — TORSEMIDE 20 MG PO TABS: 20.0000 mg | ORAL_TABLET | Freq: Every evening | ORAL | Status: DC

## 2015-08-17 MED ORDER — POTASSIUM CHLORIDE CRYS ER 20 MEQ PO TBCR: 40.0000 meq | EXTENDED_RELEASE_TABLET | Freq: Every day | ORAL | Status: DC

## 2015-08-17 MED ORDER — TORSEMIDE 20 MG PO TABS: ORAL_TABLET | ORAL | Status: DC

## 2015-08-17 NOTE — Progress Notes (Signed)
Pt a/o, no c/o pain, pt slept well, still on lasix gtt @ 15 ml/hr diuresing well, VSS, pt stable

## 2015-08-17 NOTE — Progress Notes (Signed)
Physical Therapy Treatment Patient Details Name: [name] MRN: [id number] DOB: Aug 24, 1926 Today's Date: 08/17/2015    History of Present Illness 81 yo with chronic atrial fibrillation on coumadin, chronic diastolic CHF, and OHS presents for cardiology evaluation. She has had several recent admissions. Heart cath 9/1    PT Comments    Pt progressing with gait with increased distance. Pt requires at least 3-4 L of O2 with mobility secondary to sats dropping to 82% on 2L with 28' of gait. Pt educated for posture, breathing technique, RW use and HEP. Pt encouraged to perform HEP throughout the day. Will continue to follow.   Follow Up Recommendations  Home health PT;Supervision for mobility/OOB     Equipment Recommendations       Recommendations for Other Services       Precautions / Restrictions Precautions Precautions: Fall    Mobility  Bed Mobility               General bed mobility comments: in chair on arrival  Transfers Overall transfer level: Needs assistance     Sit to Stand: Supervision         General transfer comment: cues for hand placement and safety  Ambulation/Gait Ambulation/Gait assistance: Min guard Ambulation Distance (Feet): 28 Feet Assistive device: Rolling walker (2 wheeled) Gait Pattern/deviations: Step-through pattern;Decreased stride length;Trunk flexed   Gait velocity interpretation: Below normal speed for age/gender General Gait Details: 28' x 2 with RW with seated rest break, SaO2 97% on 2L O2 at rest, with gait dropped to 82% on 4L able to maintain 95% with gait. Cues for posture, position in RW, looking up and safety   Stairs            Wheelchair Mobility    Modified Rankin (Stroke Patients Only)       Balance                                    Cognition Arousal/Alertness: Awake/alert Behavior During Therapy: WFL for tasks assessed/performed Overall Cognitive Status: Within Functional  Limits for tasks assessed                      Exercises General Exercises - Lower Extremity Long Arc Quad: AROM;Seated;Both;20 reps Hip ABduction/ADduction: AROM;Seated;Both;20 reps Hip Flexion/Marching: AROM;Seated;Both;20 reps    General Comments        Pertinent Vitals/Pain Pain Assessment: No/denies pain  HR 94-100    Home Living                      Prior Function            PT Goals (current goals can now be found in the care plan section) Progress towards PT goals: Progressing toward goals    Frequency       PT Plan Current plan remains appropriate    Co-evaluation             End of Session Equipment Utilized During Treatment: Oxygen Activity Tolerance: Patient tolerated treatment well Patient left: in chair;with call bell/phone within reach;with family/visitor present;with nursing/sitter in room;with chair alarm set     Time: 0321-2248 PT Time Calculation (min) (ACUTE ONLY): 25 min  Charges:  $Gait Training: 8-22 mins $Therapeutic Exercise: 8-22 mins                    G Codes:  Lanetta Inch Beth 08/17/2015, 11:52 AM Elwyn Reach, [place]

## 2015-08-17 NOTE — Progress Notes (Signed)
[facility] made aware of discharge home today, also patient has home oxygen with [facility]. Mindi Slicker [facility] [phone]

## 2015-08-17 NOTE — Progress Notes (Signed)
Pt being discharged home via wheelchair with family. Pt alert and oriented x4. VSS. Pt c/o no pain at this time. No signs of respiratory distress. On 3L/[place], chronic home oxygen. Education complete and care plans resolved. IV removed with catheter intact and pt tolerated well. Foley removed. Tolerated well. No further issues at this time. Pt to follow up with PCP. Leanne Chang, RN

## 2015-08-17 NOTE — Progress Notes (Signed)
Tub bench ordered as requested. Mindi Slicker [facility] [phone]

## 2015-08-17 NOTE — Progress Notes (Signed)
Patient ID: [name], female   DOB: [birth year], 81 y.o.   MRN: [id number] Advanced Heart Failure Rounding Note   Subjective:   Admitted after RHC with volume overload. Started on lasix drip.   Dyspnea significantly improved from admission and weight down considerably.  Diuresis now slowing.  Creatinine up slightly to 1.16.  RHC 08/10/15  RA mean 29 RV 73/24 PA 74/33, mean 45 PCWP mean 33 Oxygen saturations: PA 49% AO 98% Cardiac Output (Fick) 5.12  Cardiac Index (Fick) 2.27 PVR 2.3 WU   Objective:   Weight Range:  Vital Signs:   Temp:  [98.2 F (36.8 C)-98.8 F (37.1 C)] 98.2 F (36.8 C) (09/09 0459) Pulse Rate:  [94-101] 94 (09/09 0459) Resp:  [18] 18 (09/09 0459) BP: (91-108)/(43-60) 95/53 mmHg (09/09 0459) SpO2:  [100 %] 100 % (09/09 0459) Weight:  [227 lb 11.8 oz (103.3 kg)-232 lb 12.9 oz (105.6 kg)] 232 lb 12.9 oz (105.6 kg) (09/09 0459) Last BM Date: 08/12/15  Weight change: Filed Weights   08/15/15 0435 08/16/15 0808 08/17/15 0459  Weight: 237 lb 12.8 oz (107.865 kg) 227 lb 11.8 oz (103.3 kg) 232 lb 12.9 oz (105.6 kg)    Intake/Output:   Intake/Output Summary (Last 24 hours) at 08/17/15 0754 Last data filed at 08/17/15 0458  Gross per 24 hour  Intake    600 ml  Output   1675 ml  Net  -1075 ml     Physical Exam: General:  Chronically ill appearing. No resp difficulty. In bed  HEENT: normal Neck: supple. JVP 8. Carotids 2+ bilat; no bruits. No lymphadenopathy or thyromegaly noted. Cor: PMI nondisplaced. Irregularly irregular. No rubs, gallops or murmurs appreciated. Lungs: Diminished bibasilar sounds. On 2 liters [place]  Abdomen: obese, soft, NT, ND. No HSM. No bruits or masses. +BS Extremities: no cyanosis, clubbing, rash.  Trace ankle edema. SCDs in place.   Neuro: alert & orientedx3, cranial nerves grossly intact. moves all 4 extremities w/o difficulty. Affect pleasant  Telemetry: A fib 80s  Labs: Basic Metabolic Panel:  Recent Labs Lab  08/12/15 0410 08/14/15 0338 08/15/15 0650 08/16/15 0335 08/17/15 0555  NA 137 136 136 135 134*  K 4.3 4.1 3.9 3.6 3.8  CL 86* 85* 85* 86* 87*  CO2 46* 45* 42* 43* 39*  GLUCOSE 151* 153* 154* 166* 152*  BUN 11 12 12 14 19   CREATININE 1.14* 1.08* 1.09* 1.02* 1.16*  CALCIUM 8.4* 8.3* 8.6* 8.5* 8.2*    Liver Function Tests: No results for input(s): AST, ALT, ALKPHOS, BILITOT, PROT, ALBUMIN in the last 168 hours. No results for input(s): LIPASE, AMYLASE in the last 168 hours. No results for input(s): AMMONIA in the last 168 hours.  CBC:  Recent Labs Lab 08/10/15 1041 08/11/15 0337 08/15/15 0650  WBC 5.4 6.6 7.6  HGB 9.1* 9.4* 10.1*  HCT 31.9* 31.7* 33.1*  MCV 103.2* 103.6* 100.6*  PLT 132* 120* 141*    Cardiac Enzymes: No results for input(s): CKTOTAL, CKMB, CKMBINDEX, TROPONINI in the last 168 hours.  BNP: BNP (last 3 results)  Recent Labs  05/09/15 0930 08/03/15 1314 08/09/15 1155  BNP 661.2* 570.3* 561.8*    ProBNP (last 3 results)  Recent Labs  06/08/15 1619  PROBNP 370.0*      Other results:  Imaging: No results found.   Medications:     Scheduled Medications: . atorvastatin  20 mg Oral Daily  . gabapentin  300 mg Oral QHS  . metoprolol tartrate  12.5 mg Oral  BID  . pantoprazole  40 mg Oral Daily  . potassium chloride  40 mEq Oral Daily  . sodium chloride  3 mL Intravenous Q12H  . sodium chloride  3 mL Intravenous Q12H  . torsemide  20 mg Oral QPM  . torsemide  40 mg Oral QPC breakfast  . Warfarin - Pharmacist Dosing Inpatient   Does not apply q1800    Infusions:    PRN Medications: sodium chloride, sodium chloride, acetaminophen, ondansetron (ZOFRAN) IV, sodium chloride, sodium chloride   Assessment/Plan    81 yo with OHS and diastolic CHF admitted with marked volume overload.  1. Acute on chronic diastolic CHF: Echo this admission with normal EF, moderate RV dilation/moderate RV dysfunction.  Restrictive picture on RHC from  9/1 with markedly elevated filling pressures.  She appears only minimally volume overloaded at this point and diuresis seems to be tapering off.  She has lost considerable weight.  - Stop Lasix and acetazolamide.  - Will start torsemide 40 qam/20 qpm for home diuretic.  2. Pulmonary hypertension: Likely pulmonary venous hypertension that will improve with diuresis.  3. Atrial fibrillation: Stable, continue coumadin.  4. OHS: Suspect OHS, she is on home oxygen. Will ask nurse to see how she does off oxygen today.  5. Mitral regurgitation: Echo with admission with only trivial MR.  6. Mobility: PT/OT following 7. Disposition: Home today with home health/PT.  Will check O2 sats off oxygen at rest and with ambulation to see if she still needs oxygen.  Needs followup in CHF clinic with [facility] in 10 days.  Meds: torsemide 40 qam/20 qpm, warfarin, KCl 40 daily, metoprolol 12.5 bid, atorvastatin 20 daily.   Loralie Champagne 08/17/2015 7:54 AM

## 2015-08-17 NOTE — Discharge Summary (Signed)
Advanced Heart Failure Team  Discharge Summary   Patient ID: [name] MRN: [id number], DOB/AGE: Dec 04, 1926 82 y.o. Admit date: 08/09/2015 D/C date:     08/17/2015   Primary Discharge Diagnoses:  1. Acute on chronic diastolic CHF: Echo this admission with normal EF, moderate RV dilation/moderate RV dysfunction. Restrictive picture on RHC from 9/1 with markedly elevated filling pressures.  - Will change to torsemide 40 qam/20 qpm for home diuretic. (up from lasix 20 mg daily) - Holding losartan and amlodipine for low BP. Will re-evaluat at follow up.  2. Pulmonary hypertension - Likely PVH that will improve with adequte diuresis.  3. Atrial fibrillation: Stable on chronic coumadin.  4. OHS: suspected  5. Mitral regurgitation: Echo with admission with only trivial MR.  6. Limited mobility: Will get [facility] PT/OT.  Hospital Course:   [name] is a 81 y.o. with chronic atrial fibrillation on coumadin, chronic diastolic CHF, pulmonary hypertension, HTN, and MR. She has had several recent admissions. In 6/16, she had syncope while sitting on the toilet. She came to the ER. Troponin was 0.21 and temperature was up to 101. She was admitted and ended up having cardiac cath showing no obstructive CAD but 3+ MR. Echo showed normal EF with grade III diastolic dysfunction, severe pulmonary hypertension, and per report only trivial MR. She was re-admitted later in 6/16 after a fall and syncope again. She was found to be profoundly hypoglycemic. She is no longer on diabetes medications.  Baseline weight 219 - 223.  She was seen in HF clinic on 8/26.  She was found to be markedly volume overloaded and her diuretics were increased to lasix 80 mg BID with RHC scheduled the following week.   [place] 9/1 showed markedly elevated filling pressures and she was admitted for diuresis.   She was placed on a lasix drip at 10 mg increased to 15mg  with only moderate diuresis at first.  She was noted  to have elevated HCO3 on 9/5 and started on acetazolamide. She will not need acetazolamide for home. PT/OT saw during this admission and recommended [facility] with PT/OT and 24 hr supervision.  In talks with family they believed they could accomplish this with some help during the week from an RN or Aide.  HH arranged with [facility]. She ambulated without 02 and dropped her pulse ox into 80s, so will continue her home 02.   Her losartan and amlodipine held for low BP, will be evaluated at follow up.   Overall she diuresed 15.8 L and down 34 lbs (from 266 lbs).  She will be discharged home in stable condition on increased diuretic regimen. Previous chronic home regimen was 20 mg of lasix daily.  Now Torsemide 40 mg am and 20 mg pm.  She will have close follow up in the HF clinic as below.  She will receive assistance from [facility] with [facility] aide, PT, and OT.      Discharge Meds per Dr Aundra Dubin.  Torsemide 40 qam/20 qpm, warfarin, KCl 40 daily, metoprolol 12.5 bid, atorvastatin 20 daily.    Discharge Weight Range: 232 lb Discharge Vitals: Blood pressure 95/53, pulse 100, temperature 98.2 F (36.8 C), temperature source Oral, resp. rate 18, height 5\' 4"  (1.626 m), weight 232 lb 12.9 oz (105.6 kg), SpO2 94 %.  Labs: Lab Results  Component Value Date   WBC 7.6 08/15/2015   HGB 10.1* 08/15/2015   HCT 33.1* 08/15/2015   MCV 100.6* 08/15/2015   PLT 141* 08/15/2015  Recent Labs Lab 08/17/15 0555  NA 134*  K 3.8  CL 87*  CO2 39*  BUN 19  CREATININE 1.16*  CALCIUM 8.2*  GLUCOSE 152*   No results found for: CHOL, HDL, LDLCALC, TRIG BNP (last 3 results)  Recent Labs  05/09/15 0930 08/03/15 1314 08/09/15 1155  BNP 661.2* 570.3* 561.8*    ProBNP (last 3 results)  Recent Labs  06/08/15 1619  PROBNP 370.0*     Diagnostic Studies/Procedures   No results found.  Discharge Medications     Medication List    STOP taking these medications        amLODipine 5 MG tablet  Commonly known as:   NORVASC     furosemide 20 MG tablet  Commonly known as:  LASIX     valsartan 80 MG tablet  Commonly known as:  DIOVAN      TAKE these medications        acetaminophen 500 MG tablet  Commonly known as:  TYLENOL  Take 1,000 mg by mouth 2 (two) times daily as needed for mild pain.     atorvastatin 20 MG tablet  Commonly known as:  LIPITOR  Take 20 mg by mouth daily.     gabapentin 300 MG capsule  Commonly known as:  NEURONTIN  Take 300 mg by mouth at bedtime.     metoprolol tartrate 25 MG tablet  Commonly known as:  LOPRESSOR  Take 0.5 tablets (12.5 mg total) by mouth 2 (two) times daily.     pantoprazole 40 MG tablet  Commonly known as:  PROTONIX  Take 1 tablet (40 mg total) by mouth 2 (two) times daily before a meal.     potassium chloride SA 20 MEQ tablet  Commonly known as:  KLOR-CON M20  Take 2 tablets (40 mEq total) by mouth daily.     torsemide 20 MG tablet  Commonly known as:  DEMADEX  Take 2 tablets (40 mg total) every morning, and 1 tablet (20 mg) in the evening.     warfarin 2 MG tablet  Commonly known as:  COUMADIN  Take 2-4 mg by mouth daily. Take 2 mg daily except 4 mg on Thursday's and and Saturday's.        Disposition   The patient will be discharged in stable condition to home. Discharge Instructions    AMB Referral to [facility]    Complete by:  As directed   Reason for consult:  HF exacerbation- post hospital follow up  Diagnoses of:  Heart Failure  Expected date of contact:  1-3 days (reserved for hospital discharges)  Please assign to community nurse for transition of care calls and assess for home visits.  DX with HF exacerbation.  Patient will have home health as well [[facility].  Questions please call: Natividad Brood, RN BSN [facility] Liaison  [phone] business mobile phone     Contraindication to [facility] at discharge    Complete by:  As directed      Diet - low sodium heart healthy    Complete  by:  As directed      Heart Failure patients record your daily weight using the same scale at the same time of day    Complete by:  As directed      Increase activity slowly    Complete by:  As directed           Follow-up Information    Follow up with [facility] On 08/28/2015.   Specialty:  Cardiology   Why:  at 1040 with Amy.  Please bring all of your medications to your appointment.  The code for patient parking is 0090.   Contact information:   [address] [id number] [place] [phone]        Duration of Discharge Encounter: Greater than 35 minutes   Signed, Shirley Friar PA-C 08/17/2015, 12:05 PM

## 2015-08-17 NOTE — Progress Notes (Signed)
ANTICOAGULATION CONSULT NOTE - Follow Up Consult  Pharmacy Consult for Coumadin Indication: atrial fibrillation  Allergies  Allergen Reactions  . Peach [Prunus Persica] Rash    AGENT: peach fuzz    Patient Measurements: Height: 5\' 4"  (162.6 cm) Weight: 232 lb 12.9 oz (105.6 kg) (bedscale) IBW/kg (Calculated) : 54.7  Vital Signs: Temp: 98.2 F (36.8 C) (09/09 0459) Temp Source: Oral (09/09 0459) BP: 95/53 mmHg (09/09 0459) Pulse Rate: 94 (09/09 0459)  Labs:  Recent Labs  08/15/15 0650 08/16/15 0335 08/17/15 0555  HGB 10.1*  --   --   HCT 33.1*  --   --   PLT 141*  --   --   LABPROT 25.7* 25.0* 25.2*  INR 2.38* 2.29* 2.31*  CREATININE 1.09* 1.02*  --     Estimated Creatinine Clearance: 46.9 mL/min (by C-G formula based on Cr of 1.02).  Assessment: 88yof on coumadin pta for afib, admitted after RHC for diuresis. INR on admit was below goal at 1.74 but had lots of volume on board - probably impaired absorption.  Home dose: 2mg  daily except 4mg  on Thur/Sat  INR currently 2.3 - INR stable. Continue home dose. Hgb and plts stable- no bleeding noted.  Goal of Therapy:  INR 2-3 Monitor platelets by anticoagulation protocol: Yes   Plan:  -Resume home warfarin dose -INR MWF only -follow for bleeding  Erin Hearing PharmD., BCPS Clinical Pharmacist Pager [phone] 08/17/2015 7:12 AM

## 2015-08-19 DIAGNOSIS — I10 Essential (primary) hypertension: Secondary | ICD-10-CM | POA: Diagnosis not present

## 2015-08-19 DIAGNOSIS — I5033 Acute on chronic diastolic (congestive) heart failure: Secondary | ICD-10-CM | POA: Diagnosis not present

## 2015-08-19 DIAGNOSIS — I340 Nonrheumatic mitral (valve) insufficiency: Secondary | ICD-10-CM | POA: Diagnosis not present

## 2015-08-19 DIAGNOSIS — E662 Morbid (severe) obesity with alveolar hypoventilation: Secondary | ICD-10-CM | POA: Diagnosis not present

## 2015-08-19 DIAGNOSIS — Z9181 History of falling: Secondary | ICD-10-CM | POA: Diagnosis not present

## 2015-08-19 DIAGNOSIS — Z9981 Dependence on supplemental oxygen: Secondary | ICD-10-CM | POA: Diagnosis not present

## 2015-08-19 DIAGNOSIS — I270 Primary pulmonary hypertension: Secondary | ICD-10-CM | POA: Diagnosis not present

## 2015-08-19 DIAGNOSIS — E119 Type 2 diabetes mellitus without complications: Secondary | ICD-10-CM | POA: Diagnosis not present

## 2015-08-19 DIAGNOSIS — I482 Chronic atrial fibrillation: Secondary | ICD-10-CM | POA: Diagnosis not present

## 2015-08-20 ENCOUNTER — Other Ambulatory Visit: Payer: Self-pay | Admitting: *Deleted

## 2015-08-20 NOTE — Patient Outreach (Addendum)
Referral received from hospital liaison, V. Brewer, to engage in transition of care calls once discharged.  Member discharged on 9/9 after being treated for congestive heart failure.    Call placed to member, her daughter and caregiver, [facility], answers phone.  This care manger introduces self and purpose of call.  Daughter states that the member has been doing "alright."  She states that the member denies any shortness of breath or swelling at this time.  She reports that they have been contacted by [facility] and that someone will be making a home visit today.    Daughter states that the member resides with her and that she is responsible for assisting her with her care and taking her to her appointments.  She states that she does weigh the member on a daily basis and records them for trends.  She confirms that she was provided with the congestive heart failure education, "the stoplight paper", and that she is aware of when to contact the physician.  She reports that the member has a follow up appointment with the heart failure clinic on next week, and that she has to call to make an appointment with her primary care physician.  Encouraged to do so within the next week.    [name] agrees to have weekly transition of care phone calls and a home visit.  Initial home visit scheduled for 9/26.  Daughter encouraged to contact this care manager with any questions or concerns.  Contact number provided.  She denies any need for social worker or pharmacist at this time.  Will continue with weekly transition of care calls next week.  Valente David, BSN, [facility]  [facility] Care Manager [phone]

## 2015-08-21 DIAGNOSIS — I340 Nonrheumatic mitral (valve) insufficiency: Secondary | ICD-10-CM | POA: Diagnosis not present

## 2015-08-21 DIAGNOSIS — I5033 Acute on chronic diastolic (congestive) heart failure: Secondary | ICD-10-CM | POA: Diagnosis not present

## 2015-08-21 DIAGNOSIS — Z9181 History of falling: Secondary | ICD-10-CM | POA: Diagnosis not present

## 2015-08-21 DIAGNOSIS — I482 Chronic atrial fibrillation: Secondary | ICD-10-CM | POA: Diagnosis not present

## 2015-08-21 DIAGNOSIS — Z9981 Dependence on supplemental oxygen: Secondary | ICD-10-CM | POA: Diagnosis not present

## 2015-08-21 DIAGNOSIS — I10 Essential (primary) hypertension: Secondary | ICD-10-CM | POA: Diagnosis not present

## 2015-08-21 DIAGNOSIS — E119 Type 2 diabetes mellitus without complications: Secondary | ICD-10-CM | POA: Diagnosis not present

## 2015-08-21 DIAGNOSIS — E662 Morbid (severe) obesity with alveolar hypoventilation: Secondary | ICD-10-CM | POA: Diagnosis not present

## 2015-08-21 DIAGNOSIS — I270 Primary pulmonary hypertension: Secondary | ICD-10-CM | POA: Diagnosis not present

## 2015-08-22 DIAGNOSIS — Z9981 Dependence on supplemental oxygen: Secondary | ICD-10-CM | POA: Diagnosis not present

## 2015-08-22 DIAGNOSIS — Z9181 History of falling: Secondary | ICD-10-CM | POA: Diagnosis not present

## 2015-08-22 DIAGNOSIS — I5033 Acute on chronic diastolic (congestive) heart failure: Secondary | ICD-10-CM | POA: Diagnosis not present

## 2015-08-22 DIAGNOSIS — I10 Essential (primary) hypertension: Secondary | ICD-10-CM | POA: Diagnosis not present

## 2015-08-22 DIAGNOSIS — E119 Type 2 diabetes mellitus without complications: Secondary | ICD-10-CM | POA: Diagnosis not present

## 2015-08-22 DIAGNOSIS — I340 Nonrheumatic mitral (valve) insufficiency: Secondary | ICD-10-CM | POA: Diagnosis not present

## 2015-08-22 DIAGNOSIS — I270 Primary pulmonary hypertension: Secondary | ICD-10-CM | POA: Diagnosis not present

## 2015-08-22 DIAGNOSIS — I482 Chronic atrial fibrillation: Secondary | ICD-10-CM | POA: Diagnosis not present

## 2015-08-22 DIAGNOSIS — E662 Morbid (severe) obesity with alveolar hypoventilation: Secondary | ICD-10-CM | POA: Diagnosis not present

## 2015-08-24 ENCOUNTER — Other Ambulatory Visit: Payer: Self-pay

## 2015-08-24 DIAGNOSIS — I482 Chronic atrial fibrillation: Secondary | ICD-10-CM | POA: Diagnosis not present

## 2015-08-24 DIAGNOSIS — Z1231 Encounter for screening mammogram for malignant neoplasm of breast: Secondary | ICD-10-CM

## 2015-08-24 DIAGNOSIS — Z9981 Dependence on supplemental oxygen: Secondary | ICD-10-CM | POA: Diagnosis not present

## 2015-08-24 DIAGNOSIS — E119 Type 2 diabetes mellitus without complications: Secondary | ICD-10-CM | POA: Diagnosis not present

## 2015-08-24 DIAGNOSIS — E662 Morbid (severe) obesity with alveolar hypoventilation: Secondary | ICD-10-CM | POA: Diagnosis not present

## 2015-08-24 DIAGNOSIS — I270 Primary pulmonary hypertension: Secondary | ICD-10-CM | POA: Diagnosis not present

## 2015-08-24 DIAGNOSIS — I10 Essential (primary) hypertension: Secondary | ICD-10-CM | POA: Diagnosis not present

## 2015-08-24 DIAGNOSIS — Z9181 History of falling: Secondary | ICD-10-CM | POA: Diagnosis not present

## 2015-08-24 DIAGNOSIS — I5033 Acute on chronic diastolic (congestive) heart failure: Secondary | ICD-10-CM | POA: Diagnosis not present

## 2015-08-24 DIAGNOSIS — I340 Nonrheumatic mitral (valve) insufficiency: Secondary | ICD-10-CM | POA: Diagnosis not present

## 2015-08-25 DIAGNOSIS — E119 Type 2 diabetes mellitus without complications: Secondary | ICD-10-CM | POA: Diagnosis not present

## 2015-08-25 DIAGNOSIS — I340 Nonrheumatic mitral (valve) insufficiency: Secondary | ICD-10-CM | POA: Diagnosis not present

## 2015-08-25 DIAGNOSIS — I5033 Acute on chronic diastolic (congestive) heart failure: Secondary | ICD-10-CM | POA: Diagnosis not present

## 2015-08-25 DIAGNOSIS — Z9981 Dependence on supplemental oxygen: Secondary | ICD-10-CM | POA: Diagnosis not present

## 2015-08-25 DIAGNOSIS — I10 Essential (primary) hypertension: Secondary | ICD-10-CM | POA: Diagnosis not present

## 2015-08-25 DIAGNOSIS — Z9181 History of falling: Secondary | ICD-10-CM | POA: Diagnosis not present

## 2015-08-25 DIAGNOSIS — I270 Primary pulmonary hypertension: Secondary | ICD-10-CM | POA: Diagnosis not present

## 2015-08-25 DIAGNOSIS — E662 Morbid (severe) obesity with alveolar hypoventilation: Secondary | ICD-10-CM | POA: Diagnosis not present

## 2015-08-25 DIAGNOSIS — I482 Chronic atrial fibrillation: Secondary | ICD-10-CM | POA: Diagnosis not present

## 2015-08-27 DIAGNOSIS — Z9981 Dependence on supplemental oxygen: Secondary | ICD-10-CM | POA: Diagnosis not present

## 2015-08-27 DIAGNOSIS — I270 Primary pulmonary hypertension: Secondary | ICD-10-CM | POA: Diagnosis not present

## 2015-08-27 DIAGNOSIS — E119 Type 2 diabetes mellitus without complications: Secondary | ICD-10-CM | POA: Diagnosis not present

## 2015-08-27 DIAGNOSIS — I340 Nonrheumatic mitral (valve) insufficiency: Secondary | ICD-10-CM | POA: Diagnosis not present

## 2015-08-27 DIAGNOSIS — E662 Morbid (severe) obesity with alveolar hypoventilation: Secondary | ICD-10-CM | POA: Diagnosis not present

## 2015-08-27 DIAGNOSIS — Z9181 History of falling: Secondary | ICD-10-CM | POA: Diagnosis not present

## 2015-08-27 DIAGNOSIS — I10 Essential (primary) hypertension: Secondary | ICD-10-CM | POA: Diagnosis not present

## 2015-08-27 DIAGNOSIS — I482 Chronic atrial fibrillation: Secondary | ICD-10-CM | POA: Diagnosis not present

## 2015-08-27 DIAGNOSIS — I5033 Acute on chronic diastolic (congestive) heart failure: Secondary | ICD-10-CM | POA: Diagnosis not present

## 2015-08-28 ENCOUNTER — Ambulatory Visit (HOSPITAL_BASED_OUTPATIENT_CLINIC_OR_DEPARTMENT_OTHER)
Admit: 2015-08-28 | Discharge: 2015-08-28 | Disposition: A | Discharge status: Home / Self Care | Payer: Commercial Managed Care - HMO | Source: Ambulatory Visit | Attending: Internal Medicine | Admitting: Internal Medicine

## 2015-08-28 ENCOUNTER — Encounter (HOSPITAL_COMMUNITY): Payer: Self-pay

## 2015-08-28 VITALS — BP 112/66 | HR 99 | Wt 232.2 lb

## 2015-08-28 DIAGNOSIS — K219 Gastro-esophageal reflux disease without esophagitis: Secondary | ICD-10-CM | POA: Diagnosis present

## 2015-08-28 DIAGNOSIS — I425 Other restrictive cardiomyopathy: Secondary | ICD-10-CM | POA: Diagnosis not present

## 2015-08-28 DIAGNOSIS — Z79899 Other long term (current) drug therapy: Secondary | ICD-10-CM | POA: Diagnosis not present

## 2015-08-28 DIAGNOSIS — I340 Nonrheumatic mitral (valve) insufficiency: Secondary | ICD-10-CM | POA: Diagnosis not present

## 2015-08-28 DIAGNOSIS — Z7901 Long term (current) use of anticoagulants: Secondary | ICD-10-CM | POA: Insufficient documentation

## 2015-08-28 DIAGNOSIS — I482 Chronic atrial fibrillation, unspecified: Secondary | ICD-10-CM

## 2015-08-28 DIAGNOSIS — Z86718 Personal history of other venous thrombosis and embolism: Secondary | ICD-10-CM

## 2015-08-28 DIAGNOSIS — E1122 Type 2 diabetes mellitus with diabetic chronic kidney disease: Secondary | ICD-10-CM | POA: Diagnosis present

## 2015-08-28 DIAGNOSIS — I4891 Unspecified atrial fibrillation: Secondary | ICD-10-CM | POA: Diagnosis not present

## 2015-08-28 DIAGNOSIS — I270 Primary pulmonary hypertension: Secondary | ICD-10-CM | POA: Diagnosis not present

## 2015-08-28 DIAGNOSIS — M19019 Primary osteoarthritis, unspecified shoulder: Secondary | ICD-10-CM | POA: Diagnosis present

## 2015-08-28 DIAGNOSIS — I5033 Acute on chronic diastolic (congestive) heart failure: Secondary | ICD-10-CM

## 2015-08-28 DIAGNOSIS — Z9181 History of falling: Secondary | ICD-10-CM | POA: Diagnosis not present

## 2015-08-28 DIAGNOSIS — B9620 Unspecified Escherichia coli [E. coli] as the cause of diseases classified elsewhere: Secondary | ICD-10-CM | POA: Diagnosis present

## 2015-08-28 DIAGNOSIS — I252 Old myocardial infarction: Secondary | ICD-10-CM | POA: Diagnosis not present

## 2015-08-28 DIAGNOSIS — Z87898 Personal history of other specified conditions: Secondary | ICD-10-CM

## 2015-08-28 DIAGNOSIS — I10 Essential (primary) hypertension: Secondary | ICD-10-CM

## 2015-08-28 DIAGNOSIS — N10 Acute tubulo-interstitial nephritis: Secondary | ICD-10-CM | POA: Diagnosis not present

## 2015-08-28 DIAGNOSIS — A4151 Sepsis due to Escherichia coli [E. coli]: Secondary | ICD-10-CM | POA: Diagnosis not present

## 2015-08-28 DIAGNOSIS — I429 Cardiomyopathy, unspecified: Secondary | ICD-10-CM | POA: Diagnosis not present

## 2015-08-28 DIAGNOSIS — R531 Weakness: Secondary | ICD-10-CM | POA: Diagnosis not present

## 2015-08-28 DIAGNOSIS — Z923 Personal history of irradiation: Secondary | ICD-10-CM | POA: Insufficient documentation

## 2015-08-28 DIAGNOSIS — R69 Illness, unspecified: Secondary | ICD-10-CM | POA: Diagnosis not present

## 2015-08-28 DIAGNOSIS — E11649 Type 2 diabetes mellitus with hypoglycemia without coma: Secondary | ICD-10-CM | POA: Diagnosis present

## 2015-08-28 DIAGNOSIS — Z9981 Dependence on supplemental oxygen: Secondary | ICD-10-CM | POA: Insufficient documentation

## 2015-08-28 DIAGNOSIS — E876 Hypokalemia: Secondary | ICD-10-CM | POA: Diagnosis not present

## 2015-08-28 DIAGNOSIS — N183 Chronic kidney disease, stage 3 (moderate): Secondary | ICD-10-CM | POA: Diagnosis present

## 2015-08-28 DIAGNOSIS — I5032 Chronic diastolic (congestive) heart failure: Secondary | ICD-10-CM

## 2015-08-28 DIAGNOSIS — Z515 Encounter for palliative care: Secondary | ICD-10-CM | POA: Diagnosis not present

## 2015-08-28 DIAGNOSIS — N179 Acute kidney failure, unspecified: Secondary | ICD-10-CM | POA: Diagnosis not present

## 2015-08-28 DIAGNOSIS — N170 Acute kidney failure with tubular necrosis: Secondary | ICD-10-CM | POA: Diagnosis not present

## 2015-08-28 DIAGNOSIS — E785 Hyperlipidemia, unspecified: Secondary | ICD-10-CM | POA: Insufficient documentation

## 2015-08-28 DIAGNOSIS — Z6841 Body Mass Index (BMI) 40.0 and over, adult: Secondary | ICD-10-CM | POA: Diagnosis not present

## 2015-08-28 DIAGNOSIS — E662 Morbid (severe) obesity with alveolar hypoventilation: Secondary | ICD-10-CM | POA: Diagnosis not present

## 2015-08-28 DIAGNOSIS — R404 Transient alteration of awareness: Secondary | ICD-10-CM | POA: Diagnosis not present

## 2015-08-28 DIAGNOSIS — A419 Sepsis, unspecified organism: Secondary | ICD-10-CM | POA: Diagnosis not present

## 2015-08-28 DIAGNOSIS — I129 Hypertensive chronic kidney disease with stage 1 through stage 4 chronic kidney disease, or unspecified chronic kidney disease: Secondary | ICD-10-CM | POA: Diagnosis present

## 2015-08-28 DIAGNOSIS — E780 Pure hypercholesterolemia: Secondary | ICD-10-CM | POA: Diagnosis present

## 2015-08-28 DIAGNOSIS — E119 Type 2 diabetes mellitus without complications: Secondary | ICD-10-CM | POA: Insufficient documentation

## 2015-08-28 DIAGNOSIS — M25419 Effusion, unspecified shoulder: Secondary | ICD-10-CM | POA: Diagnosis not present

## 2015-08-28 DIAGNOSIS — R509 Fever, unspecified: Secondary | ICD-10-CM | POA: Diagnosis not present

## 2015-08-28 DIAGNOSIS — I081 Rheumatic disorders of both mitral and tricuspid valves: Secondary | ICD-10-CM | POA: Diagnosis present

## 2015-08-28 DIAGNOSIS — I272 Other secondary pulmonary hypertension: Secondary | ICD-10-CM | POA: Insufficient documentation

## 2015-08-28 DIAGNOSIS — G629 Polyneuropathy, unspecified: Secondary | ICD-10-CM | POA: Diagnosis not present

## 2015-08-28 DIAGNOSIS — R6521 Severe sepsis with septic shock: Secondary | ICD-10-CM | POA: Diagnosis not present

## 2015-08-28 DIAGNOSIS — D649 Anemia, unspecified: Secondary | ICD-10-CM | POA: Diagnosis present

## 2015-08-28 DIAGNOSIS — Z853 Personal history of malignant neoplasm of breast: Secondary | ICD-10-CM | POA: Diagnosis not present

## 2015-08-28 DIAGNOSIS — I2729 Other secondary pulmonary hypertension: Secondary | ICD-10-CM

## 2015-08-28 DIAGNOSIS — N390 Urinary tract infection, site not specified: Secondary | ICD-10-CM | POA: Diagnosis not present

## 2015-08-28 DIAGNOSIS — E872 Acidosis: Secondary | ICD-10-CM | POA: Diagnosis not present

## 2015-08-28 DIAGNOSIS — R6520 Severe sepsis without septic shock: Secondary | ICD-10-CM | POA: Diagnosis not present

## 2015-08-28 DIAGNOSIS — M25512 Pain in left shoulder: Secondary | ICD-10-CM | POA: Diagnosis not present

## 2015-08-28 DIAGNOSIS — I509 Heart failure, unspecified: Secondary | ICD-10-CM | POA: Diagnosis not present

## 2015-08-28 LAB — BASIC METABOLIC PANEL
Anion gap: 7 (ref 5–15)
BUN: 23 mg/dL — ABNORMAL HIGH (ref 6–20)
CALCIUM: 8.7 mg/dL — AB (ref 8.9–10.3)
CO2: 39 mmol/L — AB (ref 22–32)
CREATININE: 1.29 mg/dL — AB (ref 0.44–1.00)
Chloride: 90 mmol/L — ABNORMAL LOW (ref 101–111)
GFR calc non Af Amer: 36 mL/min — ABNORMAL LOW (ref >60)
GFR, EST AFRICAN AMERICAN: 42 mL/min — AB (ref >60)
Glucose, Bld: 195 mg/dL — ABNORMAL HIGH (ref 65–99)
Potassium: 3.7 mmol/L (ref 3.5–5.1)
SODIUM: 136 mmol/L (ref 135–145)

## 2015-08-28 NOTE — Progress Notes (Signed)
Patient ID: [name], female   DOB: May 31, 1926, 81 y.o.   MRN: [id number] PCP: Dr. Doy Hutching Referring: Dr. Melvyn Novas  81 yo with chronic atrial fibrillation on coumadin, chronic diastolic CHF, and OHS presents for cardiology evaluation.  She has had several recent admissions.  In 6/16, she had syncope while sitting on the toilet.  She came to the ER.  Troponin was 0.21 and temperature was up to 101.  She was admitted and ended up having cardiac cath showing no obstructive CAD but 3+ MR.  Echo showed normal EF with grade III diastolic dysfunction, severe pulmonary hypertension, and per report only trivial MR.  She was re-admitted later in 6/16 after a fall and syncope again.  She was found to be profoundly hypoglycemic.  She is no longer on diabetes medications.    She was referred to Dr Melvyn Novas and has been on home oxygen for OHS.  She has been wheelchair-bound for the most part since 6/16.  She used to live alone prior to 6/16 but now lives with her daughter.  The only walking she does now if from her bed to the bathroom.  She is dyspneic with any exertion.  This is stable.  She has orthopnea and sleeps almost sitting up.  No chest pain.  BP has been fluctuating a lot and is very high today.  She has been wearing compression stockings.  She thinks that she has gained > 50 lbs since 6/16.  She has been coughing.    Admitted to [facility] September 1st with marked volume overload. Diuresed with IV lasix and transitioned to torsemide 40 mg mg in am and 20 mg in pm. Had RHC with elevated filling pressures in a restrictive pattern and pulmonary venous htn. Discharge weight was 232 pounds.   Today she returns for post hospital follow up with her family. Mild dyspnea with exertion. Sleeps on 3 pillows. Wears 3 liters oxygen at home. Able to dress independently. Started moving around the house a little more. Weight at home 227-230 pounds. Taking all medications. Followed by [facility] with RN/PT.    Labs (7/16): K 3.7,  creatinine 0.93, BNP 370  PMH: 1. HTN 2. Type II diabetes: Currently on no meds.  3. Syncope 4. Chronic atrial fibrillation: No history of CVA.  5. Chronic diastolic CHF: Echo (0/01) with EF 55-60%, grade III diastolic dysfunction, PA systolic pressure 76 mmHg, trivial MR.  6. Cardiolite (2/16) with EF 48%, no ischemia.  LHC (6/16) with 20% distal LAD stenosis, EF 55-60%, 3+ MR.  7. Mitral regurgitation: Unclear how significant this is.  Trivial per 6/16 echo but 3+ per 6/16 cath.  8. H/o DVT 9. DCIS s/p lumpectomy and radiation in 2011.   10. OA 11. Hyperlipidemia 12. OHS: On home oxygen.  13. Morbid obesity 14. RHC 08/09/2015 RA mean 29, RV 73/24, PA 74/33, mean 45,PCWP mean 33,Oxygen saturations:PA 49%AO 98% ,CO 5.12 /CI2.27, PVR 2.3 WU  SH: Lives in [place] with family, never smoked.   FH: No premature CAD. +CHF.   ROS: All systems reviewed and negative except as per HPI.   Current Outpatient Prescriptions  Medication Sig Dispense Refill  . acetaminophen (TYLENOL) 500 MG tablet Take 1,000 mg by mouth 2 (two) times daily as needed for mild pain.     Marland Kitchen atorvastatin (LIPITOR) 20 MG tablet Take 20 mg by mouth daily.    Marland Kitchen gabapentin (NEURONTIN) 300 MG capsule Take 300 mg by mouth at bedtime.   3  . metoprolol  tartrate (LOPRESSOR) 25 MG tablet Take 0.5 tablets (12.5 mg total) by mouth 2 (two) times daily. 15 tablet 6  . pantoprazole (PROTONIX) 40 MG tablet Take 1 tablet (40 mg total) by mouth 2 (two) times daily before a meal. (Patient taking differently: Take 40 mg by mouth daily. ) 60 tablet 3  . potassium chloride SA (KLOR-CON M20) 20 MEQ tablet Take 2 tablets (40 mEq total) by mouth daily. 60 tablet 6  . torsemide (DEMADEX) 20 MG tablet Take 2 tablets (40 mg total) every morning, and 1 tablet (20 mg) in the evening. 90 tablet 6  . warfarin (COUMADIN) 2 MG tablet Take 2-4 mg by mouth daily. Take 2 mg daily except 4 mg on Thursday's and and Saturday's.     No current  facility-administered medications for this encounter.   BP 112/66 mmHg  Pulse 99  Wt 232 lb 4 oz (105.348 kg)  SpO2 99%  General: NAD, obese Arrived in wheel chair . Daughter present .  Neck: JVP 6-7cm, no thyromegaly or thyroid nodule.  Lungs: Clear on 3 liters oxygen.  CV: Nondisplaced PMI.  Heart regular S1/S2, no S3/S4, 3/6 HSM at apex.  No lower extremity edema. No carotid bruit.  Normal pedal pulses.  Abdomen: Soft, nontender, no hepatosplenomegaly, no distention.  Skin: Intact without lesions or rashes.  Neurologic: Alert and oriented x 3.  Psych: Normal affect. Extremities: No clubbing or cyanosis.  HEENT: Normal.   Assessment/Plan: 1. Chronic diastolic CHF:  NYHA III. Volume status stable. Continue torsemide 40 mg in am and 20 mg in pm.  Reinforced daily weights, low salt food choices, and limiting fluid intake to < 2 liters per day.  Check BMET.  2. HTN: Stable.   3. Pulmonary hypertension: Likely pulmonary venous hypertension that  improved with diuresis. Continue continuous oxygen.  4. Mitral regurgitation: ECHO 08/2015 with trivial MR  5. Atrial fibrillation: Chronic. Continue rate control/anticoagulation strategy. Continue low dose metoprolol 12.5 mg twice a day.     Follow up in 4-6 weeks.  CLEGG,AMY NP-C  08/28/2015

## 2015-08-28 NOTE — Patient Instructions (Addendum)
Labs today  Continue current medications.   Follow up 4 weeks with Dr. Aundra Dubin

## 2015-08-29 DIAGNOSIS — I5033 Acute on chronic diastolic (congestive) heart failure: Secondary | ICD-10-CM | POA: Diagnosis not present

## 2015-08-29 DIAGNOSIS — E119 Type 2 diabetes mellitus without complications: Secondary | ICD-10-CM | POA: Diagnosis not present

## 2015-08-29 DIAGNOSIS — I482 Chronic atrial fibrillation: Secondary | ICD-10-CM | POA: Diagnosis not present

## 2015-08-29 DIAGNOSIS — I270 Primary pulmonary hypertension: Secondary | ICD-10-CM | POA: Diagnosis not present

## 2015-08-29 DIAGNOSIS — Z9181 History of falling: Secondary | ICD-10-CM | POA: Diagnosis not present

## 2015-08-29 DIAGNOSIS — Z9981 Dependence on supplemental oxygen: Secondary | ICD-10-CM | POA: Diagnosis not present

## 2015-08-29 DIAGNOSIS — E662 Morbid (severe) obesity with alveolar hypoventilation: Secondary | ICD-10-CM | POA: Diagnosis not present

## 2015-08-29 DIAGNOSIS — I340 Nonrheumatic mitral (valve) insufficiency: Secondary | ICD-10-CM | POA: Diagnosis not present

## 2015-08-29 DIAGNOSIS — I10 Essential (primary) hypertension: Secondary | ICD-10-CM | POA: Diagnosis not present

## 2015-08-30 DIAGNOSIS — E119 Type 2 diabetes mellitus without complications: Secondary | ICD-10-CM | POA: Diagnosis not present

## 2015-08-30 DIAGNOSIS — Z9181 History of falling: Secondary | ICD-10-CM | POA: Diagnosis not present

## 2015-08-30 DIAGNOSIS — E662 Morbid (severe) obesity with alveolar hypoventilation: Secondary | ICD-10-CM | POA: Diagnosis not present

## 2015-08-30 DIAGNOSIS — I10 Essential (primary) hypertension: Secondary | ICD-10-CM | POA: Diagnosis not present

## 2015-08-30 DIAGNOSIS — I5033 Acute on chronic diastolic (congestive) heart failure: Secondary | ICD-10-CM | POA: Diagnosis not present

## 2015-08-30 DIAGNOSIS — Z9981 Dependence on supplemental oxygen: Secondary | ICD-10-CM | POA: Diagnosis not present

## 2015-08-30 DIAGNOSIS — I270 Primary pulmonary hypertension: Secondary | ICD-10-CM | POA: Diagnosis not present

## 2015-08-30 DIAGNOSIS — I340 Nonrheumatic mitral (valve) insufficiency: Secondary | ICD-10-CM | POA: Diagnosis not present

## 2015-08-30 DIAGNOSIS — I482 Chronic atrial fibrillation: Secondary | ICD-10-CM | POA: Diagnosis not present

## 2015-08-31 ENCOUNTER — Encounter (HOSPITAL_COMMUNITY): Payer: Self-pay | Admitting: Emergency Medicine

## 2015-08-31 ENCOUNTER — Emergency Department (HOSPITAL_COMMUNITY): Payer: Commercial Managed Care - HMO

## 2015-08-31 ENCOUNTER — Inpatient Hospital Stay (HOSPITAL_COMMUNITY)
Admission: EM | Admit: 2015-08-31 | Discharge: 2015-09-06 | DRG: 871 | Disposition: A | Discharge status: Home Health | Payer: Commercial Managed Care - HMO | Attending: Internal Medicine | Admitting: Internal Medicine

## 2015-08-31 DIAGNOSIS — E1122 Type 2 diabetes mellitus with diabetic chronic kidney disease: Secondary | ICD-10-CM | POA: Diagnosis present

## 2015-08-31 DIAGNOSIS — E785 Hyperlipidemia, unspecified: Secondary | ICD-10-CM | POA: Diagnosis present

## 2015-08-31 DIAGNOSIS — E7800 Pure hypercholesterolemia, unspecified: Secondary | ICD-10-CM | POA: Diagnosis present

## 2015-08-31 DIAGNOSIS — K219 Gastro-esophageal reflux disease without esophagitis: Secondary | ICD-10-CM | POA: Diagnosis present

## 2015-08-31 DIAGNOSIS — Z853 Personal history of malignant neoplasm of breast: Secondary | ICD-10-CM | POA: Diagnosis not present

## 2015-08-31 DIAGNOSIS — I482 Chronic atrial fibrillation, unspecified: Secondary | ICD-10-CM | POA: Diagnosis present

## 2015-08-31 DIAGNOSIS — Z515 Encounter for palliative care: Secondary | ICD-10-CM | POA: Diagnosis present

## 2015-08-31 DIAGNOSIS — Z6841 Body Mass Index (BMI) 40.0 and over, adult: Secondary | ICD-10-CM

## 2015-08-31 DIAGNOSIS — M19019 Primary osteoarthritis, unspecified shoulder: Secondary | ICD-10-CM | POA: Diagnosis present

## 2015-08-31 DIAGNOSIS — I429 Cardiomyopathy, unspecified: Secondary | ICD-10-CM | POA: Diagnosis not present

## 2015-08-31 DIAGNOSIS — E876 Hypokalemia: Secondary | ICD-10-CM | POA: Diagnosis present

## 2015-08-31 DIAGNOSIS — N10 Acute tubulo-interstitial nephritis: Secondary | ICD-10-CM | POA: Diagnosis present

## 2015-08-31 DIAGNOSIS — I509 Heart failure, unspecified: Secondary | ICD-10-CM | POA: Insufficient documentation

## 2015-08-31 DIAGNOSIS — R6521 Severe sepsis with septic shock: Secondary | ICD-10-CM | POA: Diagnosis present

## 2015-08-31 DIAGNOSIS — N12 Tubulo-interstitial nephritis, not specified as acute or chronic: Secondary | ICD-10-CM | POA: Diagnosis present

## 2015-08-31 DIAGNOSIS — I081 Rheumatic disorders of both mitral and tricuspid valves: Secondary | ICD-10-CM | POA: Diagnosis present

## 2015-08-31 DIAGNOSIS — Z9981 Dependence on supplemental oxygen: Secondary | ICD-10-CM

## 2015-08-31 DIAGNOSIS — N183 Chronic kidney disease, stage 3 (moderate): Secondary | ICD-10-CM | POA: Diagnosis present

## 2015-08-31 DIAGNOSIS — B9620 Unspecified Escherichia coli [E. coli] as the cause of diseases classified elsewhere: Secondary | ICD-10-CM | POA: Diagnosis present

## 2015-08-31 DIAGNOSIS — I5033 Acute on chronic diastolic (congestive) heart failure: Secondary | ICD-10-CM | POA: Diagnosis present

## 2015-08-31 DIAGNOSIS — I272 Other secondary pulmonary hypertension: Secondary | ICD-10-CM | POA: Diagnosis present

## 2015-08-31 DIAGNOSIS — R791 Abnormal coagulation profile: Secondary | ICD-10-CM | POA: Diagnosis present

## 2015-08-31 DIAGNOSIS — G629 Polyneuropathy, unspecified: Secondary | ICD-10-CM | POA: Diagnosis present

## 2015-08-31 DIAGNOSIS — I4891 Unspecified atrial fibrillation: Secondary | ICD-10-CM

## 2015-08-31 DIAGNOSIS — R509 Fever, unspecified: Secondary | ICD-10-CM | POA: Diagnosis present

## 2015-08-31 DIAGNOSIS — R6520 Severe sepsis without septic shock: Secondary | ICD-10-CM | POA: Diagnosis present

## 2015-08-31 DIAGNOSIS — I270 Primary pulmonary hypertension: Secondary | ICD-10-CM

## 2015-08-31 DIAGNOSIS — A4151 Sepsis due to Escherichia coli [E. coli]: Principal | ICD-10-CM | POA: Diagnosis present

## 2015-08-31 DIAGNOSIS — E872 Acidosis: Secondary | ICD-10-CM | POA: Diagnosis present

## 2015-08-31 DIAGNOSIS — I129 Hypertensive chronic kidney disease with stage 1 through stage 4 chronic kidney disease, or unspecified chronic kidney disease: Secondary | ICD-10-CM | POA: Diagnosis present

## 2015-08-31 DIAGNOSIS — Z79899 Other long term (current) drug therapy: Secondary | ICD-10-CM | POA: Diagnosis not present

## 2015-08-31 DIAGNOSIS — I252 Old myocardial infarction: Secondary | ICD-10-CM | POA: Diagnosis not present

## 2015-08-31 DIAGNOSIS — D649 Anemia, unspecified: Secondary | ICD-10-CM | POA: Diagnosis present

## 2015-08-31 DIAGNOSIS — Z86718 Personal history of other venous thrombosis and embolism: Secondary | ICD-10-CM

## 2015-08-31 DIAGNOSIS — E11649 Type 2 diabetes mellitus with hypoglycemia without coma: Secondary | ICD-10-CM | POA: Diagnosis present

## 2015-08-31 DIAGNOSIS — N179 Acute kidney failure, unspecified: Secondary | ICD-10-CM | POA: Diagnosis not present

## 2015-08-31 DIAGNOSIS — I340 Nonrheumatic mitral (valve) insufficiency: Secondary | ICD-10-CM | POA: Diagnosis present

## 2015-08-31 DIAGNOSIS — Z7901 Long term (current) use of anticoagulants: Secondary | ICD-10-CM

## 2015-08-31 DIAGNOSIS — IMO0002 Reserved for concepts with insufficient information to code with codable children: Secondary | ICD-10-CM | POA: Diagnosis present

## 2015-08-31 DIAGNOSIS — M25512 Pain in left shoulder: Secondary | ICD-10-CM

## 2015-08-31 DIAGNOSIS — I425 Other restrictive cardiomyopathy: Secondary | ICD-10-CM | POA: Diagnosis present

## 2015-08-31 DIAGNOSIS — N170 Acute kidney failure with tubular necrosis: Secondary | ICD-10-CM | POA: Diagnosis present

## 2015-08-31 DIAGNOSIS — I071 Rheumatic tricuspid insufficiency: Secondary | ICD-10-CM | POA: Diagnosis present

## 2015-08-31 DIAGNOSIS — Z923 Personal history of irradiation: Secondary | ICD-10-CM

## 2015-08-31 DIAGNOSIS — A419 Sepsis, unspecified organism: Secondary | ICD-10-CM | POA: Diagnosis not present

## 2015-08-31 DIAGNOSIS — R69 Illness, unspecified: Secondary | ICD-10-CM | POA: Diagnosis not present

## 2015-08-31 DIAGNOSIS — IMO0001 Reserved for inherently not codable concepts without codable children: Secondary | ICD-10-CM | POA: Diagnosis present

## 2015-08-31 DIAGNOSIS — N390 Urinary tract infection, site not specified: Secondary | ICD-10-CM

## 2015-08-31 LAB — URINALYSIS, ROUTINE W REFLEX MICROSCOPIC
Bilirubin Urine: NEGATIVE
GLUCOSE, UA: NEGATIVE mg/dL
Ketones, ur: NEGATIVE mg/dL
NITRITE: NEGATIVE
PH: 5.5 (ref 5.0–8.0)
PROTEIN: 30 mg/dL — AB
Specific Gravity, Urine: 1.015 (ref 1.005–1.030)
Urobilinogen, UA: 0.2 mg/dL (ref 0.0–1.0)

## 2015-08-31 LAB — CBC WITH DIFFERENTIAL/PLATELET
BASOS PCT: 0 %
Basophils Absolute: 0 10*3/uL (ref 0.0–0.1)
EOS ABS: 0 10*3/uL (ref 0.0–0.7)
Eosinophils Relative: 0 %
HCT: 31.1 % — ABNORMAL LOW (ref 36.0–46.0)
HEMOGLOBIN: 9.6 g/dL — AB (ref 12.0–15.0)
Lymphocytes Relative: 9 %
Lymphs Abs: 1.8 10*3/uL (ref 0.7–4.0)
MCH: 30.8 pg (ref 26.0–34.0)
MCHC: 30.9 g/dL (ref 30.0–36.0)
MCV: 99.7 fL (ref 78.0–100.0)
MONOS PCT: 8 %
Monocytes Absolute: 1.5 10*3/uL — ABNORMAL HIGH (ref 0.1–1.0)
NEUTROS ABS: 16.4 10*3/uL — AB (ref 1.7–7.7)
NEUTROS PCT: 83 %
Platelets: 146 10*3/uL — ABNORMAL LOW (ref 150–400)
RBC: 3.12 MIL/uL — AB (ref 3.87–5.11)
RDW: 13.8 % (ref 11.5–15.5)
WBC: 19.8 10*3/uL — AB (ref 4.0–10.5)

## 2015-08-31 LAB — GLUCOSE, CAPILLARY
GLUCOSE-CAPILLARY: 131 mg/dL — AB (ref 65–99)
GLUCOSE-CAPILLARY: 170 mg/dL — AB (ref 65–99)

## 2015-08-31 LAB — COMPREHENSIVE METABOLIC PANEL
ALBUMIN: 3.2 g/dL — AB (ref 3.5–5.0)
ALT: 8 U/L — ABNORMAL LOW (ref 14–54)
ANION GAP: 9 (ref 5–15)
AST: 22 U/L (ref 15–41)
Alkaline Phosphatase: 76 U/L (ref 38–126)
BUN: 28 mg/dL — AB (ref 6–20)
CHLORIDE: 91 mmol/L — AB (ref 101–111)
CO2: 37 mmol/L — ABNORMAL HIGH (ref 22–32)
Calcium: 8.7 mg/dL — ABNORMAL LOW (ref 8.9–10.3)
Creatinine, Ser: 1.77 mg/dL — ABNORMAL HIGH (ref 0.44–1.00)
GFR calc Af Amer: 29 mL/min — ABNORMAL LOW (ref >60)
GFR calc non Af Amer: 25 mL/min — ABNORMAL LOW (ref >60)
GLUCOSE: 211 mg/dL — AB (ref 65–99)
POTASSIUM: 4 mmol/L (ref 3.5–5.1)
Sodium: 137 mmol/L (ref 135–145)
Total Bilirubin: 1.1 mg/dL (ref 0.3–1.2)
Total Protein: 7.6 g/dL (ref 6.5–8.1)

## 2015-08-31 LAB — URINE MICROSCOPIC-ADD ON

## 2015-08-31 LAB — LACTIC ACID, PLASMA
LACTIC ACID, VENOUS: 1.7 mmol/L (ref 0.5–2.0)
LACTIC ACID, VENOUS: 2.2 mmol/L — AB (ref 0.5–2.0)

## 2015-08-31 LAB — I-STAT CG4 LACTIC ACID, ED
LACTIC ACID, VENOUS: 1.95 mmol/L (ref 0.5–2.0)
Lactic Acid, Venous: 1.81 mmol/L (ref 0.5–2.0)

## 2015-08-31 LAB — PROTIME-INR
INR: 1.59 — AB (ref 0.00–1.49)
INR: 1.71 — ABNORMAL HIGH (ref 0.00–1.49)
PROTHROMBIN TIME: 19 s — AB (ref 11.6–15.2)
PROTHROMBIN TIME: 20.1 s — AB (ref 11.6–15.2)

## 2015-08-31 LAB — PROCALCITONIN: Procalcitonin: 0.81 ng/mL

## 2015-08-31 LAB — BRAIN NATRIURETIC PEPTIDE: B Natriuretic Peptide: 874.5 pg/mL — ABNORMAL HIGH (ref 0.0–100.0)

## 2015-08-31 LAB — MRSA PCR SCREENING: MRSA by PCR: NEGATIVE

## 2015-08-31 LAB — APTT: aPTT: 35 seconds (ref 24–37)

## 2015-08-31 MED ORDER — WARFARIN - PHARMACIST DOSING INPATIENT
Freq: Every day | Status: DC
  Administered 2015-08-31 – 2015-09-05 (×4)

## 2015-08-31 MED ORDER — ACETAMINOPHEN 325 MG PO TABS: 325.0000 mg | ORAL_TABLET | Freq: Once | ORAL | Status: DC

## 2015-08-31 MED ORDER — SODIUM CHLORIDE 0.9 % IJ SOLN: 3.0000 mL | INTRAMUSCULAR | Status: DC | PRN

## 2015-08-31 MED ORDER — PANTOPRAZOLE SODIUM 40 MG PO TBEC
40.0000 mg | DELAYED_RELEASE_TABLET | Freq: Two times a day (BID) | ORAL | Status: DC
  Administered 2015-08-31 – 2015-09-06 (×12): 40 mg via ORAL
  Filled 2015-08-31 (×12): qty 1

## 2015-08-31 MED ORDER — WARFARIN SODIUM 4 MG PO TABS
4.0000 mg | ORAL_TABLET | Freq: Once | ORAL | Status: AC
  Administered 2015-08-31: 4 mg via ORAL
  Filled 2015-08-31: qty 1

## 2015-08-31 MED ORDER — METOPROLOL TARTRATE 1 MG/ML IV SOLN
INTRAVENOUS | Status: AC
  Filled 2015-08-31: qty 5

## 2015-08-31 MED ORDER — SODIUM CHLORIDE 0.9 % IV SOLN
INTRAVENOUS | Status: AC
  Administered 2015-08-31 (×2): via INTRAVENOUS

## 2015-08-31 MED ORDER — ACETAMINOPHEN 325 MG PO TABS
650.0000 mg | ORAL_TABLET | Freq: Four times a day (QID) | ORAL | Status: DC | PRN
  Administered 2015-09-01 – 2015-09-02 (×3): 650 mg via ORAL
  Filled 2015-08-31 (×4): qty 2

## 2015-08-31 MED ORDER — DOCUSATE SODIUM 100 MG PO CAPS
100.0000 mg | ORAL_CAPSULE | Freq: Two times a day (BID) | ORAL | Status: DC
  Administered 2015-08-31 – 2015-09-06 (×11): 100 mg via ORAL
  Filled 2015-08-31 (×12): qty 1

## 2015-08-31 MED ORDER — SODIUM CHLORIDE 0.9 % IV BOLUS (SEPSIS)
1000.0000 mL | Freq: Once | INTRAVENOUS | Status: AC
  Administered 2015-08-31: 1000 mL via INTRAVENOUS

## 2015-08-31 MED ORDER — ATORVASTATIN CALCIUM 20 MG PO TABS
20.0000 mg | ORAL_TABLET | Freq: Every day | ORAL | Status: DC
  Administered 2015-08-31 – 2015-09-06 (×7): 20 mg via ORAL
  Filled 2015-08-31 (×7): qty 1

## 2015-08-31 MED ORDER — VANCOMYCIN HCL 10 G IV SOLR
2000.0000 mg | INTRAVENOUS | Status: AC
  Administered 2015-08-31: 2000 mg via INTRAVENOUS
  Filled 2015-08-31: qty 2000

## 2015-08-31 MED ORDER — METOPROLOL TARTRATE 1 MG/ML IV SOLN
5.0000 mg | Freq: Once | INTRAVENOUS | Status: AC
  Administered 2015-08-31: 5 mg via INTRAVENOUS

## 2015-08-31 MED ORDER — CETYLPYRIDINIUM CHLORIDE 0.05 % MT LIQD
7.0000 mL | Freq: Two times a day (BID) | OROMUCOSAL | Status: DC
  Administered 2015-08-31 – 2015-09-06 (×11): 7 mL via OROMUCOSAL

## 2015-08-31 MED ORDER — INSULIN ASPART 100 UNIT/ML ~~LOC~~ SOLN
0.0000 [IU] | Freq: Three times a day (TID) | SUBCUTANEOUS | Status: DC
  Administered 2015-08-31: 2 [IU] via SUBCUTANEOUS
  Administered 2015-09-01: 5 [IU] via SUBCUTANEOUS
  Administered 2015-09-02 – 2015-09-06 (×5): 2 [IU] via SUBCUTANEOUS

## 2015-08-31 MED ORDER — ACETAMINOPHEN 325 MG PO TABS
650.0000 mg | ORAL_TABLET | Freq: Once | ORAL | Status: AC
  Administered 2015-08-31: 650 mg via ORAL
  Filled 2015-08-31: qty 2

## 2015-08-31 MED ORDER — METOPROLOL TARTRATE 1 MG/ML IV SOLN
5.0000 mg | Freq: Four times a day (QID) | INTRAVENOUS | Status: DC
  Administered 2015-08-31 (×2): 5 mg via INTRAVENOUS
  Filled 2015-08-31 (×2): qty 5

## 2015-08-31 MED ORDER — METOPROLOL TARTRATE 25 MG PO TABS: 12.5000 mg | ORAL_TABLET | Freq: Two times a day (BID) | ORAL | Status: DC

## 2015-08-31 MED ORDER — INSULIN GLARGINE 100 UNIT/ML ~~LOC~~ SOLN
5.0000 [IU] | Freq: Every day | SUBCUTANEOUS | Status: DC
  Administered 2015-08-31 – 2015-09-02 (×3): 5 [IU] via SUBCUTANEOUS
  Filled 2015-08-31 (×4): qty 0.05

## 2015-08-31 MED ORDER — SODIUM CHLORIDE 0.9 % IV SOLN: INTRAVENOUS | Status: DC

## 2015-08-31 MED ORDER — AMIODARONE HCL IN DEXTROSE 360-4.14 MG/200ML-% IV SOLN
30.0000 mg/h | INTRAVENOUS | Status: DC
  Administered 2015-09-01 (×2): 30 mg/h via INTRAVENOUS
  Filled 2015-08-31 (×2): qty 200

## 2015-08-31 MED ORDER — METOPROLOL TARTRATE 1 MG/ML IV SOLN: 10.0000 mg | Freq: Four times a day (QID) | INTRAVENOUS | Status: DC

## 2015-08-31 MED ORDER — SODIUM CHLORIDE 0.9 % IJ SOLN
3.0000 mL | Freq: Two times a day (BID) | INTRAMUSCULAR | Status: DC
  Administered 2015-08-31 – 2015-09-02 (×5): 3 mL via INTRAVENOUS

## 2015-08-31 MED ORDER — SODIUM CHLORIDE 0.9 % IJ SOLN
3.0000 mL | Freq: Two times a day (BID) | INTRAMUSCULAR | Status: DC
  Administered 2015-08-31 – 2015-09-03 (×6): 3 mL via INTRAVENOUS

## 2015-08-31 MED ORDER — AMIODARONE LOAD VIA INFUSION
150.0000 mg | Freq: Once | INTRAVENOUS | Status: AC
  Administered 2015-08-31: 150 mg via INTRAVENOUS
  Filled 2015-08-31 (×2): qty 83.34

## 2015-08-31 MED ORDER — HEPARIN (PORCINE) IN NACL 100-0.45 UNIT/ML-% IJ SOLN
1350.0000 [IU]/h | INTRAMUSCULAR | Status: DC
  Administered 2015-08-31: 1000 [IU]/h via INTRAVENOUS
  Filled 2015-08-31 (×2): qty 250

## 2015-08-31 MED ORDER — PIPERACILLIN-TAZOBACTAM IN DEX 2-0.25 GM/50ML IV SOLN
2.2500 g | Freq: Three times a day (TID) | INTRAVENOUS | Status: DC
Start: 2015-09-01 — End: 2015-09-01
  Administered 2015-09-01: 2.25 g via INTRAVENOUS
  Filled 2015-08-31 (×3): qty 50

## 2015-08-31 MED ORDER — ACETAMINOPHEN 650 MG RE SUPP: 650.0000 mg | Freq: Four times a day (QID) | RECTAL | Status: DC | PRN

## 2015-08-31 MED ORDER — DEXTROSE 5 % IV SOLN
2.0000 g | INTRAVENOUS | Status: AC
  Administered 2015-08-31: 2 g via INTRAVENOUS
  Filled 2015-08-31: qty 2

## 2015-08-31 MED ORDER — AMIODARONE HCL IN DEXTROSE 360-4.14 MG/200ML-% IV SOLN
60.0000 mg/h | INTRAVENOUS | Status: AC
  Administered 2015-08-31 – 2015-09-01 (×2): 60 mg/h via INTRAVENOUS
  Filled 2015-08-31 (×2): qty 200

## 2015-08-31 MED ORDER — SODIUM CHLORIDE 0.9 % IV SOLN: 250.0000 mL | INTRAVENOUS | Status: DC | PRN

## 2015-08-31 MED ORDER — VANCOMYCIN HCL 10 G IV SOLR
1500.0000 mg | INTRAVENOUS | Status: DC
  Administered 2015-09-02: 1500 mg via INTRAVENOUS
  Filled 2015-08-31: qty 1500

## 2015-08-31 NOTE — Progress Notes (Addendum)
ANTICOAGULATION/ANTIBIOTIC CONSULT NOTE - Initial Consult  Pharmacy Consult for warfarin, vanc/zosyn Indication: atrial fibrillation, sepsis  Allergies  Allergen Reactions  . Peach [Prunus Persica] Rash    AGENT: peach fuzz    Patient Measurements: Height: 5\' 2"  (157.5 cm) IBW/kg (Calculated) : 50.1  Vital Signs: Temp: 101.2 F (38.4 C) (09/23 1036) Temp Source: Rectal (09/23 1036) BP: 123/69 mmHg (09/23 1515) Pulse Rate: 137 (09/23 1515)  Labs:  Recent Labs  08/31/15 0952 08/31/15 0953  HGB 9.6*  --   HCT 31.1*  --   PLT 146*  --   LABPROT  --  19.0*  INR  --  1.59*  CREATININE 1.77*  --     Estimated Creatinine Clearance: 26 mL/min (by C-G formula based on Cr of 1.77).   Medical History: Past Medical History  Diagnosis Date  . 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 DVT (deep venous thrombosis)   . Arthritis   . Hyperlipidemia   . Hypertension   . CHF (congestive heart failure)   . Anemia   . Cholelithiasis   . GI bleed   . Breast cancer 2012    right  . Peripheral neuropathy   . Heart murmur   . Syncope 05/09/2015  . Shortness of breath dyspnea   . Diabetes mellitus   . GERD (gastroesophageal reflux disease)      Assessment: 50 yof to ED with vomiting and confusion. Code Sepsis called - Obtained verbal this AM for 1x order of vanc + ceftaz for sepsis, hand delivered to the bedside, instructed RN to give ceftaz first - Tmax 102.2, WBC 19.8, Scr 1.77, LA 1.81  Vanc/Zosyn for sepsis to continue per Dr. Thereasa Solo. Vanc kept due to recent admissions. CrCl~26  9/23 vanc>> 9/24 zosyn>> 9/23 ceftaz x 1 dose>>  9/23 UC>> 9/23 BCx2>>  Pharmacy also consulted to dose warfarin pta for afib. INR 1.59 on admit. Hg 9.6, plt 146. No bleed documented.   PTA warfarin dose: 2mg  daily, except 4mg  on Th/Sat (last dose 9/22 pta)   Goal of Therapy:  INR 2-3 Monitor platelets by anticoagulation  protocol: Yes  Vanc Trough 15-20   Plan:  - Vanc 2g x 1 given this AM; then Vanc 1500mg  IV 48h - Zosyn 2.25g IV q8h - start 9/24 @1000  - Mon clinical progress, c/s, renal function, abx plan - VT@SS  as indicated  - Warfarin 4mg  x 1 dose tonight - Daily INR, mon s/sx bleed  Elicia Lamp, PharmD Clinical Pharmacist Pager [phone] 08/31/2015 3:40 PM

## 2015-08-31 NOTE — H&P (Signed)
[facility] History and Physical                                                                                    [name], is a 81 y.o. female  MRN: [id number]   DOB - 1926/06/28  Admit Date - 08/31/2015  Outpatient Primary MD for the patient is Maximino Greenland, MD  Referring Physician:  Dr. Colvin Caroli, [place].  Chief Complaint:   Chief Complaint  Patient presents with  . Code Sepsis     HPI [name]  is a 81 y.o. female, with PHTN, D-HF, diabetes mellitus, with a history of GI bleed, DCIS.  She presents to the ER today with vomiting and confusion.  History is gathered from her daughter, [name] as the patient is lethargic and mildly confused.  Per [name] the patient has been doing well since discharge on 08/17/15.  During her admission for heart failure she was diuresed and her weight dropped from 270+ to 232.  Yesterday the patient weighed 228.  Yesterday evening the patient felt poorly, developed a fever and began to vomit.  She was unable to eat lunch or supper yesterday and has had no breakfast or medications today.  Today she has become confused (thinking her husband was still alive and living with her) and she has begun to cough up orange phlegm.    In the ER the patient has a temp of 102.2, pulse rate of 123, initial BP of 81/58.  Her BP has responded to IVF and is currently SBP = 106.  WBC is 19.8.  Baseline creatinine is 1.08.  Today (9/23) creatine is 1.77.  CXR shows chronic pulm edema and a small pleural effusion.  Review of Systems   In addition to the HPI above,   No Headache, No changes with Vision or hearing, No problems swallowing food or Liquids, No Chest pain,  Shortness of Breath, No Abdominal pain,  Bowel movements are regular, No Blood in stool or Urine, No dysuria, No new skin rashes or bruises, No new joints pains-aches,  No new weakness, tingling, numbness in any extremity,  A full 10 point Review of Systems was done, except as stated above,  all other Review of Systems were negative.  Past Medical History  Past Medical History  Diagnosis Date  . 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 DVT (deep venous thrombosis)   . Arthritis   . Hyperlipidemia   . Hypertension   . CHF (congestive heart failure)   . Anemia   . Cholelithiasis   . GI bleed   . Breast cancer 2012    right  . Peripheral neuropathy   . Heart murmur   . Syncope 05/09/2015  . Shortness of breath dyspnea   . Diabetes mellitus   . GERD (gastroesophageal reflux disease)     Past Surgical History  Procedure Laterality Date  . Breast surgery    . Esophagogastroduodenoscopy  09/01/2012    Procedure: ESOPHAGOGASTRODUODENOSCOPY (EGD);  Surgeon: Ladene Artist, MD,FACG;  Location: [facility] ENDOSCOPY;  Service: Endoscopy;  Laterality: N/A;  . Esophagogastroduodenoscopy  09/03/2012  Procedure: ESOPHAGOGASTRODUODENOSCOPY (EGD);  Surgeon: Inda Castle, MD;  Location: [place];  Service: Endoscopy;  Laterality: N/A;  . Endoscopic retrograde cholangiopancreatography (ercp) with propofol  10/14/2012    Procedure: ENDOSCOPIC RETROGRADE CHOLANGIOPANCREATOGRAPHY (ERCP) WITH PROPOFOL;  Surgeon: Milus Banister, MD;  Location: [facility] ENDOSCOPY;  Service: Endoscopy;  Laterality: N/A;  . Eus  10/14/2012    Procedure: UPPER ENDOSCOPIC ULTRASOUND (EUS) LINEAR;  Surgeon: Milus Banister, MD;  Location: [facility] ENDOSCOPY;  Service: Endoscopy;  Laterality: N/A;  . Eus  11/11/2012    Procedure: UPPER ENDOSCOPIC ULTRASOUND (EUS) LINEAR;  Surgeon: Milus Banister, MD;  Location: [facility] ENDOSCOPY;  Service: Endoscopy;  Laterality: N/A;  . Cataract extraction Left 05/08/2015  . Cardiac catheterization N/A 05/11/2015    Procedure: Left Heart Cath and Coronary Angiography;  Surgeon: [name], MD;  Location: [place] [facility];  Service: Cardiovascular;  Laterality: N/A;  . Cardiac catheterization N/A 08/09/2015    Procedure: Right Heart Cath;   Surgeon: [name], MD;  Location: [place] [facility];  Service: Cardiovascular;  Laterality: N/A;      Social History Social History  Substance Use Topics  . Smoking status: Never Smoker   . Smokeless tobacco: Never Used  . Alcohol Use: No  Lives at home with dtr.  Walks with walker.  Needed some assistance with ADLs.  Family History Patient unable to provide due to confusion. Unable to reach daughter at this time.  Prior to Admission medications   Medication Sig Start Date End Date Taking? Authorizing Provider  acetaminophen (TYLENOL) 500 MG tablet Take 1,000 mg by mouth 2 (two) times daily as needed for mild pain.    Yes Historical Provider, MD  atorvastatin (LIPITOR) 20 MG tablet Take 20 mg by mouth daily.   Yes Historical Provider, MD  gabapentin (NEURONTIN) 300 MG capsule Take 300 mg by mouth at bedtime.  04/09/15  Yes Historical Provider, MD  metoprolol tartrate (LOPRESSOR) 25 MG tablet Take 0.5 tablets (12.5 mg total) by mouth 2 (two) times daily. 08/17/15  Yes Shirley Friar, PA-C  pantoprazole (PROTONIX) 40 MG tablet Take 1 tablet (40 mg total) by mouth 2 (two) times daily before a meal. Patient taking differently: Take 40 mg by mouth 2 (two) times daily.  09/07/12  Yes [name], MD  potassium chloride SA (KLOR-CON M20) 20 MEQ tablet Take 2 tablets (40 mEq total) by mouth daily. 08/17/15  Yes Shirley Friar, PA-C  torsemide (DEMADEX) 20 MG tablet Take 2 tablets (40 mg total) every morning, and 1 tablet (20 mg) in the evening. 08/17/15  Yes Shirley Friar, PA-C  warfarin (COUMADIN) 2 MG tablet Take 2-4 mg by mouth daily. Take 2 mg daily except 4 mg on Thursday's and and Saturday's.   Yes Historical Provider, MD    Allergies  Allergen Reactions  . Peach [Prunus Persica] Rash    AGENT: peach fuzz    Physical Exam  Vitals  Blood pressure 132/86, pulse 110, temperature 101.2 F (38.4 C), temperature source Rectal, resp. rate 20, height 5\' 2"   (1.575 m), SpO2 95 %.  General:  Elderly female, obese, sleeping, awakens to physical exam. Confused and sleepy. Daughters at bedside.  Psych:  Confused and sleepy. Calm.  Neuro:   No F.N deficits, ALL C.Nerves Intact, Strength 5/5 all 4 extremities, Sensation intact all 4 extremities.  ENT:  Ears and Eyes appear Normal, Conjunctivae clear, PER. Moist oral mucosa without erythema or exudates.  Neck:  Supple, No  lymphadenopathy appreciated  Respiratory:  Symmetrical chest wall movement, Good air movement bilaterally, CTAB.  Cardiac:  Irregularly irregular, No Murmurs, bilateral 2-3+ pitting edema (appears chronic), ++ JVP  Abdomen:  Obese, Positive bowel sounds, Soft, Non tender, Non distended,  No masses appreciated  Skin:  No Cyanosis, Normal Skin Turgor, No Skin Rash or Bruise. Darkened areas of skin around the ankles and second left toe.  Extremities:  Able to move all 4. 5/5 strength in each,  no effusions.  Data Review  CBC  Recent Labs Lab 08/31/15 0952  WBC 19.8*  HGB 9.6*  HCT 31.1*  PLT 146*  MCV 99.7  MCH 30.8  MCHC 30.9  RDW 13.8  LYMPHSABS 1.8  MONOABS 1.5*  EOSABS 0.0  BASOSABS 0.0    Chemistries   Recent Labs Lab 08/28/15 1153 08/31/15 0952  NA 136 137  K 3.7 4.0  CL 90* 91*  CO2 39* 37*  GLUCOSE 195* 211*  BUN 23* 28*  CREATININE 1.29* 1.77*  CALCIUM 8.7* 8.7*  AST  --  22  ALT  --  8*  ALKPHOS  --  76  BILITOT  --  1.1     Coagulation profile  Recent Labs Lab 08/31/15 0953  INR 1.59*     Urinalysis    Component Value Date/Time   COLORURINE YELLOW 08/31/2015 1035   APPEARANCEUR TURBID* 08/31/2015 1035   LABSPEC 1.015 08/31/2015 1035   PHURINE 5.5 08/31/2015 1035   GLUCOSEU NEGATIVE 08/31/2015 1035   HGBUR MODERATE* 08/31/2015 1035   BILIRUBINUR NEGATIVE 08/31/2015 1035   [place] 08/31/2015 1035   PROTEINUR 30* 08/31/2015 1035   UROBILINOGEN 0.2 08/31/2015 1035   NITRITE NEGATIVE 08/31/2015 1035    LEUKOCYTESUR LARGE* 08/31/2015 1035   Imaging results:   Dg Chest 2 View  08/31/2015   CLINICAL DATA:  Fever today.  The patient feels ill.  EXAM: CHEST  2 VIEW  COMPARISON:  PA and lateral chest 06/08/2015, 10/31/2013 and 08/28/2012.  FINDINGS: There is cardiomegaly and pulmonary vascular congestion. No consolidative process or pneumothorax is identified. There may be a very small left pleural effusion. Aortic atherosclerosis is noted.  IMPRESSION: Very small left pleural effusion.  No evidence of pneumonia.  Cardiomegaly and chronic pulmonary vascular congestion.   Electronically Signed   By: Inge Rise M.D.   On: 08/31/2015 10:06    My personal review of EKG: afib, with PVCs.   Prolonged QT.   Assessment & Plan  Principal Problem:   Severe sepsis with acute organ dysfunction Active Problems:   Mitral valve regurgitation with secondary PH   HYPERCHOLESTEROLEMIA   Chronic atrial fibrillation- CHADs VASc =6   Chronic anticoagulation-Warfarin   Pulmonary hypertension, moderate to severe   Pyelonephritis   Atrial fibrillation with RVR   Subtherapeutic international normalized ratio (INR)   Acute renal failure   Severe sepsis with organ dysfunction - urinary source/pyelonephritis Acute renal failure with likely pyelonephritis.  BP soft but responding to fluid thus far.  Will need close monitoring in stepdown.  Given full code status I have a low threshold for calling CCM if her blood pressure drops again and does not respond to modest volume expansion.   Given careful volume resuscitation due to tenuous HF status, 1500 ml in the ER then 100 ml/hr x 20 hours. Blood and urine cultures obtained.  Antibiotics started in the ER.  Will change to Zosyn per sepsis order set. Follow lactic acid as I am concerned it may rise.  Acute renal failure with likely pyelonephritis Baseline creatinine is 1.08, current creatinine is 1.77. UA is positive for infection. Patient febrile at 102. Likely  simple prerenal v/s ATN - follow w/ hydration.    Chronic diastolic heart failure with Pulmonary Htn Dry weight approximately 232.  Recent echo on 08/09/15 showed LVEF of 55-60, with severely dilated right atrium and pulm artery filling pressure of 93 mm hg. Holding lasix for now.  Given careful volume resuscitation.  1500 ml in the ER then 100 ml/hr x 20 hours. Daily weights, strict I's and O's, heart healthy diet, beta blocker continued. CHF Team alerted to her admission, and asked to f/u 9/24.  Atrial fibrillation with RVR RVR likely due to infection plus patient's inability to take medications today. Heart rate stabilizing with forward resuscitation.  Continue Coumadin per pharmacy. INR currently subtherapeutic. Metoprolol for rate control.  Pulmonary HTN Holding lasix.  Daily weight.  Closely monitor volume status.  Repeat CXR in am.    Consultants Called:  Palliative Medicine.  Family Communication:   Daughters at bedside  Code Status:  Full Code (Per Dtr).  Patient is currently confused however she stated she did not want to be intubated.  As she is confused we will adhere to the daughter's wishes.  Palliative Medicine consulted.  Condition:  Guarded. Family understands that their mother's condition is poor and could rapidly deteriorate.  Potential Disposition: TBD.  Likely to home in 4-5 days pending improvement.  Time spent in minutes : [address],  PA-C on 08/31/2015 at 2:06 PM  [facility] Between 7am to 7pm - Pager - [phone] After 7pm go to [email or web address] - password [email or web address] And look for the night coverage person covering me after hours   Attending Attestation:  I have personally examined this patient and reviewed the entire database. I have reviewed the above note, made any necessary editorial changes, and agree with its content.  As noted above, in brief the patient is an 81 year old female with a complex medical history primarily focused about  significant pulmonary venous hypertension and a propensity for significant total body volume overload.  She's had multiple recent admissions with her most recent admission being to the congestive heart failure service at which time they diuresed her extensively.  She returned to the ED with generalized complaints to include nausea vomiting and fever.  Her workup is consistent with sepsis with urinary source.  Though she was initially significant tachycardic and hypotensive her lactate was not inordinately elevated.  She is responded to moderate volume resuscitation and given the special circumstance of her recent significant volume overload we are opting to slow her volume resuscitation.  She'll be monitored closely in stepdown unit with small to moderate dose additional boluses as indicated based upon close hemodynamic monitoring.  Cherene Altes, MD [facility]

## 2015-08-31 NOTE — Progress Notes (Signed)
Per Pt's daughter, plan is for a meeting with palliative care and family tomorrow 09/01/15 @ 12:00 pm. Haynes Dage, [place] aware.

## 2015-08-31 NOTE — Progress Notes (Signed)
ANTICOAGULATION CONSULT NOTE - Initial Consult  Pharmacy Consult:  Heparin Indication:  Afib with RVR  Allergies  Allergen Reactions  . Peach [Prunus Persica] Rash    AGENT: peach fuzz    Patient Measurements: Height: 5\' 2"  (157.5 cm) Weight: 229 lb 11.5 oz (104.2 kg) IBW/kg (Calculated) : 50.1 Heparin Dosing Weight: 75 kg  Vital Signs: Temp: 100.5 F (38.1 C) (09/23 1728) Temp Source: Oral (09/23 1728) BP: 130/88 mmHg (09/23 1845) Pulse Rate: 129 (09/23 1900)  Labs:  Recent Labs  08/31/15 0952 08/31/15 0953  HGB 9.6*  --   HCT 31.1*  --   PLT 146*  --   LABPROT  --  19.0*  INR  --  1.59*  CREATININE 1.77*  --     Estimated Creatinine Clearance: 25.8 mL/min (by C-G formula based on Cr of 1.77).   Medical History: Past Medical History  Diagnosis Date  . 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 DVT (deep venous thrombosis)   . Arthritis   . Hyperlipidemia   . Hypertension   . CHF (congestive heart failure)   . Anemia   . Cholelithiasis   . GI bleed   . Breast cancer 2012    right  . Peripheral neuropathy   . Heart murmur   . Syncope 05/09/2015  . Shortness of breath dyspnea   . Diabetes mellitus   . GERD (gastroesophageal reflux disease)       Assessment: 75 YOF known to Pharmacy from antibiotic on Coumadin dosing for Afib.  Patient now has RVR and Pharmacy consulted to initiate IV heparin while INR is sub-therapeutic.  No bleeding reported.   Goal of Therapy:  Heparin level 0.3-0.7 units/ml Monitor platelets by anticoagulation protocol: Yes    Plan:  - Heparin gtt at 1000 units/hr, no bolus - Check 8 hr HL - Daily HL / CBC    Thuy D. Mina Marble, PharmD, BCPS Pager:  [phone] 08/31/2015, 7:46 PM

## 2015-08-31 NOTE — ED Provider Notes (Signed)
CSN: [id number]     Arrival date & time 08/31/15  1062 History   First MD Initiated Contact with Patient 08/31/15 0900     Chief Complaint  Patient presents with  . Code Sepsis     (Consider location/radiation/quality/duration/timing/severity/associated sxs/prior Treatment) HPI Patient has felt generally ill for approximately 2 days. At home a temperature have been measured of 100.8. Patient denies pain. There has not specifically been vomiting or diarrhea. EMS noted a "foul-smelling urine" patient denies she's been experiencing burning or urgency. Cough just noted today has been productive of a whitish blood-tinged sputum. Patient denies chest pain. She denies shortness of breath. Patient is status post MI 2 weeks ago. Patient had A. fib with RVR on route and medics administered 10 mg of Cardizem. At this time the history is coming from the patient's daughter and the patient herself. Past Medical History  Diagnosis Date  . 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 DVT (deep venous thrombosis)   . Arthritis   . Hyperlipidemia   . Hypertension   . CHF (congestive heart failure)   . Anemia   . Cholelithiasis   . GI bleed   . Breast cancer 2012    right  . Peripheral neuropathy   . Heart murmur   . Syncope 05/09/2015  . Shortness of breath dyspnea   . Diabetes mellitus   . GERD (gastroesophageal reflux disease)    Past Surgical History  Procedure Laterality Date  . Breast surgery    . Esophagogastroduodenoscopy  09/01/2012    Procedure: ESOPHAGOGASTRODUODENOSCOPY (EGD);  Surgeon: Ladene Artist, MD,FACG;  Location: [facility] ENDOSCOPY;  Service: Endoscopy;  Laterality: N/A;  . Esophagogastroduodenoscopy  09/03/2012    Procedure: ESOPHAGOGASTRODUODENOSCOPY (EGD);  Surgeon: Inda Castle, MD;  Location: [place];  Service: Endoscopy;  Laterality: N/A;  . Endoscopic retrograde cholangiopancreatography (ercp) with propofol   10/14/2012    Procedure: ENDOSCOPIC RETROGRADE CHOLANGIOPANCREATOGRAPHY (ERCP) WITH PROPOFOL;  Surgeon: Milus Banister, MD;  Location: [facility] ENDOSCOPY;  Service: Endoscopy;  Laterality: N/A;  . Eus  10/14/2012    Procedure: UPPER ENDOSCOPIC ULTRASOUND (EUS) LINEAR;  Surgeon: Milus Banister, MD;  Location: [facility] ENDOSCOPY;  Service: Endoscopy;  Laterality: N/A;  . Eus  11/11/2012    Procedure: UPPER ENDOSCOPIC ULTRASOUND (EUS) LINEAR;  Surgeon: Milus Banister, MD;  Location: [facility]OSCOPY;  Service: Endoscopy;  Laterality: N/A;  . Cataract extraction Left 05/08/2015  . Cardiac catheterization N/A 05/11/2015    Procedure: Left Heart Cath and Coronary Angiography;  Surgeon: [name], MD;  Location: [place] [facility];  Service: Cardiovascular;  Laterality: N/A;  . Cardiac catheterization N/A 08/09/2015    Procedure: Right Heart Cath;  Surgeon: [name], MD;  Location: [place] [facility];  Service: Cardiovascular;  Laterality: N/A;   No family history on file. Social History  Substance Use Topics  . Smoking status: Never Smoker   . Smokeless tobacco: Never Used  . Alcohol Use: No   OB History    No data available     Review of Systems 10 Systems reviewed and are negative for acute change except as noted in the HPI.    Allergies  Peach  Home Medications   Prior to Admission medications   Medication Sig Start Date End Date Taking? Authorizing Nkenge Sonntag  acetaminophen (TYLENOL) 500 MG tablet Take 1,000 mg by mouth 2 (two) times daily as needed for mild pain.  Yes Historical Miliyah Luper, MD  atorvastatin (LIPITOR) 20 MG tablet Take 20 mg by mouth daily.   Yes Historical Iolani Twilley, MD  gabapentin (NEURONTIN) 300 MG capsule Take 300 mg by mouth at bedtime.  04/09/15  Yes Historical Edrian Melucci, MD  metoprolol tartrate (LOPRESSOR) 25 MG tablet Take 0.5 tablets (12.5 mg total) by mouth 2 (two) times daily. 08/17/15  Yes Shirley Friar, PA-C  pantoprazole (PROTONIX) 40 MG tablet Take 1 tablet  (40 mg total) by mouth 2 (two) times daily before a meal. Patient taking differently: Take 40 mg by mouth 2 (two) times daily.  09/07/12  Yes [name], MD  potassium chloride SA (KLOR-CON M20) 20 MEQ tablet Take 2 tablets (40 mEq total) by mouth daily. 08/17/15  Yes Shirley Friar, PA-C  torsemide (DEMADEX) 20 MG tablet Take 2 tablets (40 mg total) every morning, and 1 tablet (20 mg) in the evening. 08/17/15  Yes Shirley Friar, PA-C  warfarin (COUMADIN) 2 MG tablet Take 2-4 mg by mouth daily. Take 2 mg daily except 4 mg on Thursday's and and Saturday's.   Yes Historical Willis Kuipers, MD   BP 93/43 mmHg  Pulse 106  Temp(Src) 101.2 F (38.4 C) (Rectal)  Resp 18  Ht 5\' 2"  (1.575 m)  SpO2 100% Physical Exam  Constitutional: She is oriented to person, place, and time. She appears well-developed and well-nourished.  Patient is alert. She does not have acute respiratory distress. She is answering questions appropriately. She is nontoxic in appearance.  HENT:  Head: Normocephalic and atraumatic.  Right Ear: External ear normal.  Left Ear: External ear normal.  Nose: Nose normal.  Mouth/Throat: Oropharynx is clear and moist.  Eyes: EOM are normal. Pupils are equal, round, and reactive to light.  Neck: Neck supple.  Cardiovascular: Intact distal pulses.   Heart is irregularly irregular rate is approximately 100 on the monitor. No gross rub murmur gallop  Pulmonary/Chest: Effort normal. No respiratory distress. She has rales.  Fine Bibasilar rails.  Abdominal: Soft. Bowel sounds are normal. She exhibits no distension. There is no tenderness.  No intertriginous candidal rashes  Musculoskeletal: Normal range of motion. She exhibits edema. She exhibits no tenderness.  1+ edema bilateral lower extremities  Neurological: She is alert and oriented to person, place, and time. She has normal strength. No cranial nerve deficit. She exhibits normal muscle tone. Coordination normal. GCS eye  subscore is 4. GCS verbal subscore is 5. GCS motor subscore is 6.  Skin: Skin is warm, dry and intact. No rash noted.  Psychiatric: She has a normal mood and affect.    ED Course  Procedures (including critical care time) Labs Review Labs Reviewed  COMPREHENSIVE METABOLIC PANEL - Abnormal; Notable for the following:    Chloride 91 (*)    CO2 37 (*)    Glucose, Bld 211 (*)    BUN 28 (*)    Creatinine, Ser 1.77 (*)    Calcium 8.7 (*)    Albumin 3.2 (*)    ALT 8 (*)    GFR calc non Af Amer 25 (*)    GFR calc Af Amer 29 (*)    All other components within normal limits  URINALYSIS, ROUTINE W REFLEX MICROSCOPIC (NOT AT [facility]) - Abnormal; Notable for the following:    APPearance TURBID (*)    Hgb urine dipstick MODERATE (*)    Protein, ur 30 (*)    Leukocytes, UA LARGE (*)    All other components within normal limits  CBC WITH DIFFERENTIAL/PLATELET - Abnormal; Notable for the following:    WBC 19.8 (*)    RBC 3.12 (*)    Hemoglobin 9.6 (*)    HCT 31.1 (*)    Platelets 146 (*)    Neutro Abs 16.4 (*)    Monocytes Absolute 1.5 (*)    All other components within normal limits  PROTIME-INR - Abnormal; Notable for the following:    Prothrombin Time 19.0 (*)    INR 1.59 (*)    All other components within normal limits  URINE MICROSCOPIC-ADD ON - Abnormal; Notable for the following:    Bacteria, UA MANY (*)    All other components within normal limits  CULTURE, BLOOD (ROUTINE X 2)  CULTURE, BLOOD (ROUTINE X 2)  URINE CULTURE  I-STAT CG4 LACTIC ACID, ED  I-STAT CG4 LACTIC ACID, ED    Imaging Review Dg Chest 2 View  08/31/2015   CLINICAL DATA:  Fever today.  The patient feels ill.  EXAM: CHEST  2 VIEW  COMPARISON:  PA and lateral chest 06/08/2015, 10/31/2013 and 08/28/2012.  FINDINGS: There is cardiomegaly and pulmonary vascular congestion. No consolidative process or pneumothorax is identified. There may be a very small left pleural effusion. Aortic atherosclerosis is noted.   IMPRESSION: Very small left pleural effusion.  No evidence of pneumonia.  Cardiomegaly and chronic pulmonary vascular congestion.   Electronically Signed   By: Inge Rise M.D.   On: 08/31/2015 10:06   I have personally reviewed and evaluated these images and lab results as part of my medical decision-making.   EKG Interpretation   Date/Time:  Friday August 31 2015 08:57:13 EDT Ventricular Rate:  126 PR Interval:    QRS Duration: 74 QT Interval:  370 QTC Calculation: 536 R Axis:   -9 Text Interpretation:  Atrial fibrillation Ventricular premature complex  Abnormal R-wave progression, late transition Nonspecific T abnormalities,  lateral leads Prolonged QT interval agree.  no sig change from old  Confirmed by Johnney Killian, MD, Jeannie Done [phone]) on 08/31/2015 9:35:01 AM     CRITICAL CARE Performed by: Charlesetta Shanks   Total critical care time: 30  Critical care time was exclusive of separately billable procedures and treating other patients.  Critical care was necessary to treat or prevent imminent or life-threatening deterioration.  Critical care was time spent personally by me on the following activities: development of treatment plan with patient and/or surrogate as well as nursing, discussions with consultants, evaluation of patient's response to treatment, examination of patient, obtaining history from patient or surrogate, ordering and performing treatments and interventions, ordering and review of laboratory studies, ordering and review of radiographic studies, pulse oximetry and re-evaluation of patient's condition. MDM   Final diagnoses:  Sepsis, due to unspecified organism  UTI (lower urinary tract infection)   Patient presents with generalized malaise type symptoms. Fever documented at 102. Urine is grossly positive. Findings are consistent with sepsis. The patient's mental status is clear. She has no respiratory distress. She will be admitted for ongoing  treatment.    Charlesetta Shanks, MD 08/31/15 [phone]

## 2015-08-31 NOTE — ED Notes (Signed)
Pt to ED from home with c/o "not feeling well x 2 days" -- family reports strong urine odor also-- pt febrile for EMS-- given tylenol 1000 mg enroute-- pt also was in AFib RVR-- rate of 140's-- given Cardizem 10 mg bolus enroute.  Pt lives with daughter--

## 2015-08-31 NOTE — Consult Note (Signed)
Reason for Consult: chronic a fib now with RVR   Referring Physician: Dr. Thereasa Solo   PCP:  Maximino Greenland, MD  Primary Cardiologist:Dr. Andre Lefort CLYDE UPSHAW is an 81 y.o. female.    Chief Complaint: admitted today with sepsis.   HPI:  81 year old female with chronic atrial fibrillation on coumadin, chronic diastolic CHF, pulmonary hypertension, HTN, and MR. She has had several recent admissions. In 6/16, she had syncope while sitting on the toilet. She came to the ER. Troponin was 0.21 and temperature was up to 101. She was admitted and ended up having cardiac cath showing no obstructive CAD but 3+ MR. Echo showed normal EF with grade III diastolic dysfunction, severe pulmonary hypertension, and per report only trivial MR. She was re-admitted later in 6/16 after a fall and syncope again. She was found to be profoundly hypoglycemic. She is no longer on diabetes medications. Baseline weight 219 - 223.  She was seen in HF clinic on 8/26. She was found to be markedly volume overloaded and her diuretics were increased to lasix 80 mg BID with RHC 08/09/15 scheduled the following week. [place] 9/1 showed markedly elevated filling pressures and she was admitted for diuresis on 08/09/15.   Her meds were changed from lsix to torsemide, and losartan and amlodipine held due to hypotension.  Her a fib was stable.   On the 20th seen in HF clinic and she only had mild dyspnea with exertion.  Was sleeping on 3 pillows.  On 3L [place] at home.  Wt 227-230lbs.  Continued on low dose lopressore 12.5 BID.  Today pt presented to ER with not feeling well yesterday developed a fever and was vomiting. Unable to eat, and she became confused today.    Temp of 102.2 in ER, a fib with RVR of 123, hypotensive at 81/58.  With IVF BP increased to 256 systolic.  WBC at 19.8.  Cr up to 1.77 from 1.08.  CXR with pul edema and sm pl effusion.  INR is 1.59. EKG a fib with RVR 126   Ventricular premature  complex Abnormal R-wave progression, late transition Nonspecific T abnormalities, lateral leads Prolonged QT interval Qtc 536  We were called for HR 170.   Overall rate is 120 to 145, few beats up to 170 but I do not find prolonged episodes that fast.  Pt without chest pain and denies SOB.    Echo 08/09/15: Study Conclusions - Left ventricle: The cavity size was normal. There was severe concentric hypertrophy. Systolic function was normal. The estimated ejection fraction was in the range of 55% to 60%. Wall motion was normal; there were no regional wall motion abnormalities. Doppler parameters are consistent with high ventricular filling pressure. - Mitral valve: Moderately calcified annulus. Mildly thickened, moderately calcified leaflets . - Left atrium: The atrium was mildly dilated. - Right ventricle: The cavity size was moderately dilated. Wall thickness was normal. Systolic function was moderately reduced. - Right atrium: The atrium was severely dilated. - Tricuspid valve: There was moderate regurgitation. - Pulmonary arteries: Systolic pressure was severely increased. PA peak pressure: 93 mm Hg (S). Impressions: - Compared to the prior study, there has been no significant interval change.  Rt Heart Cath 08/09/15: Right Heart Pressures RHC Procedural Findings: Hemodynamics (mmHg) RA mean 29 RV 73/24 PA 74/33, mean 45 PCWP mean 33  Oxygen saturations: PA 49% AO 98%  Cardiac Output (Fick) 5.12  Cardiac Index (Fick) 2.27 PVR  2.3 WU 1. Markedly elevated right and left heart filling pressures in a restrictive pattern.  2. Pulmonary venous hypertension   Past Medical History  Diagnosis Date  . 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 DVT (deep venous thrombosis)   . Arthritis   . Hyperlipidemia   . Hypertension   . CHF (congestive heart failure)   . Anemia   . Cholelithiasis   . GI bleed     . Breast cancer 2012    right  . Peripheral neuropathy   . Heart murmur   . Syncope 05/09/2015  . Shortness of breath dyspnea   . Diabetes mellitus   . GERD (gastroesophageal reflux disease)     Past Surgical History  Procedure Laterality Date  . Breast surgery    . Esophagogastroduodenoscopy  09/01/2012    Procedure: ESOPHAGOGASTRODUODENOSCOPY (EGD);  Surgeon: Ladene Artist, MD,FACG;  Location[facility] ENDOSCOPY;  Service: Endoscopy;  Laterality: N/A;  . Esophagogastroduodenoscopy  09/03/2012    Procedure: ESOPHAGOGASTRODUODENOSCOPY (EGD);  Surgeon: Inda Castle, MD;  Location: [place];  Service: Endoscopy;  Laterality: N/A;  . Endoscopic retrograde cholangiopancreatography (ercp) with propofol  10/14/2012    Procedure: ENDOSCOPIC RETROGRADE CHOLANGIOPANCREATOGRAPHY (ERCP) WITH PROPOFOL;  Surgeon: Milus Banister, MD;  Location: [facility] ENDOSCOPY;  Service: Endoscopy;  Laterality: N/A;  . Eus  10/14/2012    Procedure: UPPER ENDOSCOPIC ULTRASOUND (EUS) LINEAR;  Surgeon: Milus Banister, MD;  Location: [facility] ENDOSCOPY;  Service: Endoscopy;  Laterality: N/A;  . Eus  11/11/2012    Procedure: UPPER ENDOSCOPIC ULTRASOUND (EUS) LINEAR;  Surgeon: Milus Banister, MD;  Location: [facility] ENDOSCOPY;  Service: Endoscopy;  Laterality: N/A;  . Cataract extraction Left 05/08/2015  . Cardiac catheterization N/A 05/11/2015    Procedure: Left Heart Cath and Coronary Angiography;  Surgeon: [name], MD;  Location: [place] [facility];  Service: Cardiovascular;  Laterality: N/A;  . Cardiac catheterization N/A 08/09/2015    Procedure: Right Heart Cath;  Surgeon: [name], MD;  Location: [place] [facility];  Service: Cardiovascular;  Laterality: N/A;    History reviewed. No pertinent family history. Social History:  reports that she has never smoked. She has never used smokeless tobacco. She reports that she does not drink alcohol or use illicit drugs.  Allergies:  Allergies  Allergen Reactions  . Peach  [Prunus Persica] Rash    AGENT: peach fuzz    OUTPATIENT MEDICATIONS: No current facility-administered medications on file prior to encounter.   Current Outpatient Prescriptions on File Prior to Encounter  Medication Sig Dispense Refill  . acetaminophen (TYLENOL) 500 MG tablet Take 1,000 mg by mouth 2 (two) times daily as needed for mild pain.     Marland Kitchen atorvastatin (LIPITOR) 20 MG tablet Take 20 mg by mouth daily.    Marland Kitchen gabapentin (NEURONTIN) 300 MG capsule Take 300 mg by mouth at bedtime.   3  . metoprolol tartrate (LOPRESSOR) 25 MG tablet Take 0.5 tablets (12.5 mg total) by mouth 2 (two) times daily. 15 tablet 6  . pantoprazole (PROTONIX) 40 MG tablet Take 1 tablet (40 mg total) by mouth 2 (two) times daily before a meal. (Patient taking differently: Take 40 mg by mouth 2 (two) times daily. ) 60 tablet 3  . potassium chloride SA (KLOR-CON M20) 20 MEQ tablet Take 2 tablets (40 mEq total) by mouth daily. 60 tablet 6  . torsemide (DEMADEX) 20 MG tablet Take 2 tablets (  40 mg total) every morning, and 1 tablet (20 mg) in the evening. 90 tablet 6  . warfarin (COUMADIN) 2 MG tablet Take 2-4 mg by mouth daily. Take 2 mg daily except 4 mg on Thursday's and and Saturday's.       Results for orders placed or performed during the hospital encounter of 08/31/15 (from the past 48 hour(s))  Comprehensive metabolic panel     Status: Abnormal   Collection Time: 08/31/15  9:52 AM  Result Value Ref Range   Sodium 137 135 - 145 mmol/L   Potassium 4.0 3.5 - 5.1 mmol/L   Chloride 91 (L) 101 - 111 mmol/L   CO2 37 (H) 22 - 32 mmol/L   Glucose, Bld 211 (H) 65 - 99 mg/dL   BUN 28 (H) 6 - 20 mg/dL   Creatinine, Ser 1.77 (H) 0.44 - 1.00 mg/dL   Calcium 8.7 (L) 8.9 - 10.3 mg/dL   Total Protein 7.6 6.5 - 8.1 g/dL   Albumin 3.2 (L) 3.5 - 5.0 g/dL   AST 22 15 - 41 U/L   ALT 8 (L) 14 - 54 U/L   Alkaline Phosphatase 76 38 - 126 U/L   Total Bilirubin 1.1 0.3 - 1.2 mg/dL   GFR calc non Af Amer 25 (L) >60 mL/min    GFR calc Af Amer 29 (L) >60 mL/min    Comment: (NOTE) The eGFR has been calculated using the CKD EPI equation. This calculation has not been validated in all clinical situations. eGFR's persistently <60 mL/min signify possible Chronic Kidney Disease.    Anion gap 9 5 - 15  I-Stat CG4 Lactic Acid, ED  (not at [facility])     Status: None   Collection Time: 08/31/15  9:52 AM  Result Value Ref Range   Lactic Acid, Venous 1.81 0.5 - 2.0 mmol/L  CBC with Differential     Status: Abnormal   Collection Time: 08/31/15  9:52 AM  Result Value Ref Range   WBC 19.8 (H) 4.0 - 10.5 K/uL   RBC 3.12 (L) 3.87 - 5.11 MIL/uL   Hemoglobin 9.6 (L) 12.0 - 15.0 g/dL   HCT 31.1 (L) 36.0 - 46.0 %   MCV 99.7 78.0 - 100.0 fL   MCH 30.8 26.0 - 34.0 pg   MCHC 30.9 30.0 - 36.0 g/dL   RDW 13.8 11.5 - 15.5 %   Platelets 146 (L) 150 - 400 K/uL   Neutrophils Relative % 83 %   Neutro Abs 16.4 (H) 1.7 - 7.7 K/uL   Lymphocytes Relative 9 %   Lymphs Abs 1.8 0.7 - 4.0 K/uL   Monocytes Relative 8 %   Monocytes Absolute 1.5 (H) 0.1 - 1.0 K/uL   Eosinophils Relative 0 %   Eosinophils Absolute 0.0 0.0 - 0.7 K/uL   Basophils Relative 0 %   Basophils Absolute 0.0 0.0 - 0.1 K/uL  Protime-INR     Status: Abnormal   Collection Time: 08/31/15  9:53 AM  Result Value Ref Range   Prothrombin Time 19.0 (H) 11.6 - 15.2 seconds   INR 1.59 (H) 0.00 - 1.49  Urinalysis, Routine w reflex microscopic (not at [facility])     Status: Abnormal   Collection Time: 08/31/15 10:35 AM  Result Value Ref Range   Color, Urine YELLOW YELLOW   APPearance TURBID (A) CLEAR   Specific Gravity, Urine 1.015 1.005 - 1.030   pH 5.5 5.0 - 8.0   Glucose, UA NEGATIVE NEGATIVE mg/dL   Hgb urine dipstick  MODERATE (A) NEGATIVE   Bilirubin Urine NEGATIVE NEGATIVE   Ketones, ur NEGATIVE NEGATIVE mg/dL   Protein, ur 30 (A) NEGATIVE mg/dL   Urobilinogen, UA 0.2 0.0 - 1.0 mg/dL   Nitrite NEGATIVE NEGATIVE   Leukocytes, UA LARGE (A) NEGATIVE  Urine microscopic-add  on     Status: Abnormal   Collection Time: 08/31/15 10:35 AM  Result Value Ref Range   WBC, UA TOO NUMEROUS TO COUNT <3 WBC/hpf   RBC / HPF 7-10 <3 RBC/hpf   Bacteria, UA MANY (A) RARE  I-Stat CG4 Lactic Acid, ED  (not at [facility])     Status: None   Collection Time: 08/31/15  1:06 PM  Result Value Ref Range   Lactic Acid, Venous 1.95 0.5 - 2.0 mmol/L   Dg Chest 2 View  08/31/2015   CLINICAL DATA:  Fever today.  The patient feels ill.  EXAM: CHEST  2 VIEW  COMPARISON:  PA and lateral chest 06/08/2015, 10/31/2013 and 08/28/2012.  FINDINGS: There is cardiomegaly and pulmonary vascular congestion. No consolidative process or pneumothorax is identified. There may be a very small left pleural effusion. Aortic atherosclerosis is noted.  IMPRESSION: Very small left pleural effusion.  No evidence of pneumonia.  Cardiomegaly and chronic pulmonary vascular congestion.   Electronically Signed   By: Inge Rise M.D.   On: 08/31/2015 10:06    ROS: General:no colds + fever, no weight changes- yesterday 228 lbs, did not weigh today, some confusion cannot answer questions well Skin:no rashes or ulcers HEENT:no blurred vision, no congestion CV:see HPI PUL:see HPI GI:no diarrhea constipation or melena, no indigestion-per family GU:no hematuria, no dysuria-per family MS:no joint pain, no claudication Neuro:no syncope, no lightheadedness Endo:+ diabetes, no thyroid disease   Blood pressure 154/89, pulse 98, temperature 100.5 F (38.1 C), temperature source Oral, resp. rate 24, height 5' 2"  (1.575 m), weight 229 lb 11.5 oz (104.2 kg), SpO2 100 %.  Wt Readings from Last 3 Encounters:  08/31/15 229 lb 11.5 oz (104.2 kg)  08/28/15 232 lb 4 oz (105.348 kg)  08/17/15 232 lb 12.9 oz (105.6 kg)    PE: General:Pleasant affect, NAD Skin: hot and dry, brisk capillary refill HEENT:normocephalic, sclera clear, mucus membranes moist Neck:supple, JVP to jaw, no bruits  Heart:irreg irreg rapid without murmur,  gallup, rub or click Lungs:fairly clear without rales, rhonchi, or wheezes JAS:NKNL, non tender, + distended + BS, do not palpate liver spleen or masses Ext:no lower ext edema and tr at feet and ankles,  2+ radial pulses Neuro:alert and oriented to person and [place] but not building or date, MAE, follows commands, + facial symmetry  tele a fib with RVR   Assessment/Plan Principal Problem:   Severe sepsis with acute organ dysfunction Active Problems:   HYPERCHOLESTEROLEMIA   Chronic atrial fibrillation- CHADs VASc =6   Mitral valve regurgitation with secondary PH   Chronic anticoagulation-Warfarin   Pulmonary hypertension, moderate to severe   Pyelonephritis   Atrial fibrillation with RVR   Subtherapeutic international normalized ratio (INR)   Acute renal failure  1 A fib with RVR- rates up to 150 at times combination of illness- while CXR notes pul edema seems more euvolemic.  Receiving fluids now.  Lopressor on hold for hypotension, but BP improved would resume- with nausea-difficulty taking po 5 mg IV lopressor has been ordered- Dr. Haroldine Laws has seen will stop fluids and add IV amiodarone.  We can stop once she is recovering from her sepsis.    2. Chronic a  fib on coumadin with CHA2DS2VASc score of 6, INR is not therapeutic would begin IV heparin.  3.  Chronic diastolic HF- stable now but could easily become volume overloaded. Recent echo with normal EF, moderate RV dilation/moderate RV dysfunction. Restrictive picture on RHC from 9/1 with markedly elevated filling pressures.   4. Mitral regurgitation - on recent echo trivial MR.   [facility] R  Nurse Practitioner Certified [place] Pager [phone] or after 5pm or weekends call [phone] 08/31/2015, 6:09 PM   Patient seen and examined with Cecilie Kicks NP-C. We discussed all aspects of the encounter. I agree with the assessment and plan as stated above.   I have reviewed recent [place] and clinic  notes. She has severe restrictive cardiomyopathy and chronic AF. Now admitted with AF with RVR and hypotension in setting of probable urosepsis. BP improved with fluid resuscitation. HR remains fast. As neck veins are up significantly would stop IVF. If BP drops again would start neosynephrine. Watch volume status closely. Would use IV amio for rate control for now and can stop when she has improved. We will follow.   Bensimhon, Daniel,MD 7:34 PM

## 2015-08-31 NOTE — ED Notes (Signed)
Report given to [place], RN on 2600.

## 2015-08-31 NOTE — ED Notes (Signed)
Dr. Colvin Caroli made aware of pt's BP-- orders for 500cc bolus of NS. Bolus started.

## 2015-09-01 ENCOUNTER — Inpatient Hospital Stay (HOSPITAL_COMMUNITY): Payer: Commercial Managed Care - HMO

## 2015-09-01 DIAGNOSIS — R69 Illness, unspecified: Secondary | ICD-10-CM

## 2015-09-01 DIAGNOSIS — IMO0002 Reserved for concepts with insufficient information to code with codable children: Secondary | ICD-10-CM | POA: Diagnosis present

## 2015-09-01 DIAGNOSIS — Z515 Encounter for palliative care: Secondary | ICD-10-CM | POA: Diagnosis present

## 2015-09-01 DIAGNOSIS — I429 Cardiomyopathy, unspecified: Secondary | ICD-10-CM | POA: Diagnosis present

## 2015-09-01 DIAGNOSIS — N10 Acute tubulo-interstitial nephritis: Secondary | ICD-10-CM | POA: Diagnosis present

## 2015-09-01 DIAGNOSIS — IMO0001 Reserved for inherently not codable concepts without codable children: Secondary | ICD-10-CM | POA: Diagnosis present

## 2015-09-01 DIAGNOSIS — I071 Rheumatic tricuspid insufficiency: Secondary | ICD-10-CM | POA: Diagnosis present

## 2015-09-01 LAB — HEPARIN LEVEL (UNFRACTIONATED)
HEPARIN UNFRACTIONATED: 0.15 [IU]/mL — AB (ref 0.30–0.70)
Heparin Unfractionated: 0.28 IU/mL — ABNORMAL LOW (ref 0.30–0.70)
Heparin Unfractionated: 0.28 IU/mL — ABNORMAL LOW (ref 0.30–0.70)

## 2015-09-01 LAB — BASIC METABOLIC PANEL
ANION GAP: 13 (ref 5–15)
BUN: 23 mg/dL — ABNORMAL HIGH (ref 6–20)
CHLORIDE: 92 mmol/L — AB (ref 101–111)
CO2: 30 mmol/L (ref 22–32)
Calcium: 8.3 mg/dL — ABNORMAL LOW (ref 8.9–10.3)
Creatinine, Ser: 1.42 mg/dL — ABNORMAL HIGH (ref 0.44–1.00)
GFR calc Af Amer: 38 mL/min — ABNORMAL LOW (ref >60)
GFR, EST NON AFRICAN AMERICAN: 32 mL/min — AB (ref >60)
GLUCOSE: 166 mg/dL — AB (ref 65–99)
POTASSIUM: 3.9 mmol/L (ref 3.5–5.1)
Sodium: 135 mmol/L (ref 135–145)

## 2015-09-01 LAB — COMPREHENSIVE METABOLIC PANEL
ALBUMIN: 2.7 g/dL — AB (ref 3.5–5.0)
ALK PHOS: 75 U/L (ref 38–126)
ALT: 7 U/L — AB (ref 14–54)
ANION GAP: 9 (ref 5–15)
AST: 20 U/L (ref 15–41)
BILIRUBIN TOTAL: 1.2 mg/dL (ref 0.3–1.2)
BUN: 24 mg/dL — AB (ref 6–20)
CALCIUM: 8.2 mg/dL — AB (ref 8.9–10.3)
CO2: 33 mmol/L — AB (ref 22–32)
CREATININE: 1.4 mg/dL — AB (ref 0.44–1.00)
Chloride: 93 mmol/L — ABNORMAL LOW (ref 101–111)
GFR calc Af Amer: 38 mL/min — ABNORMAL LOW (ref >60)
GFR calc non Af Amer: 33 mL/min — ABNORMAL LOW (ref >60)
GLUCOSE: 161 mg/dL — AB (ref 65–99)
Potassium: 3.6 mmol/L (ref 3.5–5.1)
SODIUM: 135 mmol/L (ref 135–145)
TOTAL PROTEIN: 6.8 g/dL (ref 6.5–8.1)

## 2015-09-01 LAB — HEMOGLOBIN A1C
Hgb A1c MFr Bld: 7.1 % — ABNORMAL HIGH (ref 4.8–5.6)
MEAN PLASMA GLUCOSE: 157 mg/dL

## 2015-09-01 LAB — PROTIME-INR
INR: 1.95 — ABNORMAL HIGH (ref 0.00–1.49)
PROTHROMBIN TIME: 22.1 s — AB (ref 11.6–15.2)

## 2015-09-01 LAB — CBC
HCT: 29.8 % — ABNORMAL LOW (ref 36.0–46.0)
HEMATOCRIT: 29.8 % — AB (ref 36.0–46.0)
HEMOGLOBIN: 9.5 g/dL — AB (ref 12.0–15.0)
HEMOGLOBIN: 9.4 g/dL — AB (ref 12.0–15.0)
MCH: 31.6 pg (ref 26.0–34.0)
MCH: 30.9 pg (ref 26.0–34.0)
MCHC: 31.9 g/dL (ref 30.0–36.0)
MCHC: 31.5 g/dL (ref 30.0–36.0)
MCV: 98 fL (ref 78.0–100.0)
MCV: 99 fL (ref 78.0–100.0)
Platelets: 112 10*3/uL — ABNORMAL LOW (ref 150–400)
Platelets: 117 10*3/uL — ABNORMAL LOW (ref 150–400)
RBC: 3.01 MIL/uL — ABNORMAL LOW (ref 3.87–5.11)
RBC: 3.04 MIL/uL — ABNORMAL LOW (ref 3.87–5.11)
RDW: 13.8 % (ref 11.5–15.5)
RDW: 13.8 % (ref 11.5–15.5)
WBC: 18.3 10*3/uL — ABNORMAL HIGH (ref 4.0–10.5)
WBC: 20.4 10*3/uL — ABNORMAL HIGH (ref 4.0–10.5)

## 2015-09-01 LAB — GLUCOSE, CAPILLARY
GLUCOSE-CAPILLARY: 120 mg/dL — AB (ref 65–99)
Glucose-Capillary: 205 mg/dL — ABNORMAL HIGH (ref 65–99)
Glucose-Capillary: 82 mg/dL (ref 65–99)
Glucose-Capillary: 91 mg/dL (ref 65–99)

## 2015-09-01 MED ORDER — METOPROLOL TARTRATE 1 MG/ML IV SOLN
2.5000 mg | Freq: Three times a day (TID) | INTRAVENOUS | Status: DC
  Administered 2015-09-01 (×2): 2.5 mg via INTRAVENOUS
  Filled 2015-09-01 (×2): qty 5

## 2015-09-01 MED ORDER — WARFARIN SODIUM 2 MG PO TABS
2.0000 mg | ORAL_TABLET | Freq: Once | ORAL | Status: AC
Start: 2015-09-01 — End: 2015-09-01
  Administered 2015-09-01: 2 mg via ORAL
  Filled 2015-09-01 (×3): qty 1

## 2015-09-01 MED ORDER — PIPERACILLIN-TAZOBACTAM 3.375 G IVPB
3.3750 g | Freq: Three times a day (TID) | INTRAVENOUS | Status: DC
  Administered 2015-09-01 – 2015-09-02 (×3): 3.375 g via INTRAVENOUS
  Filled 2015-09-01 (×5): qty 50

## 2015-09-01 MED ORDER — METOPROLOL TARTRATE 1 MG/ML IV SOLN: 5.0000 mg | Freq: Three times a day (TID) | INTRAVENOUS | Status: DC

## 2015-09-01 MED ORDER — METOPROLOL TARTRATE 1 MG/ML IV SOLN
5.0000 mg | Freq: Four times a day (QID) | INTRAVENOUS | Status: DC
  Administered 2015-09-01 – 2015-09-03 (×8): 5 mg via INTRAVENOUS
  Filled 2015-09-01 (×8): qty 5

## 2015-09-01 NOTE — Progress Notes (Signed)
Patient ID: [name], female   DOB: 03-24-1926, 81 y.o.   MRN: [id number]    Primary cardiologist:  Subjective:   No complaints   Objective:   Temp:  [98.7 F (37.1 C)-102.2 F (39 C)] 98.7 F (37.1 C) (09/24 0344) Pulse Rate:  [68-172] 113 (09/24 0800) Resp:  [17-38] 27 (09/24 0800) BP: (81-173)/(36-146) 130/40 mmHg (09/24 0800) SpO2:  [80 %-100 %] 100 % (09/24 0800) Weight:  [229 lb 11.5 oz (104.2 kg)-235 lb 7.2 oz (106.8 kg)] 235 lb 7.2 oz (106.8 kg) (09/24 0500)    Filed Weights   08/31/15 1627 09/01/15 0500  Weight: 229 lb 11.5 oz (104.2 kg) 235 lb 7.2 oz (106.8 kg)    Intake/Output Summary (Last 24 hours) at 09/01/15 0847 Last data filed at 09/01/15 0800  Gross per 24 hour  Intake 2111.62 ml  Output      0 ml  Net 2111.62 ml    Telemetry: afib rates 100-130s  Exam:  General: NAD  HEENT: sclera clear  Resp: CTAB  Cardiac: irreg, tachycardia, no m/r/g, elevated JVD  GI: abdomen soft, NT, ND  MSK: trace bilateral LE edema  Neuro: no focal deficits  Psych: appropriate affect  Lab Results:  Basic Metabolic Panel:  Recent Labs Lab 08/31/15 0952 08/31/15 2344 09/01/15 0416  NA 137 135 135  K 4.0 3.9 3.6  CL 91* 92* 93*  CO2 37* 30 33*  GLUCOSE 211* 166* 161*  BUN 28* 23* 24*  CREATININE 1.77* 1.42* 1.40*  CALCIUM 8.7* 8.3* 8.2*    Liver Function Tests:  Recent Labs Lab 08/31/15 0952 09/01/15 0416  AST 22 20  ALT 8* 7*  ALKPHOS 76 75  BILITOT 1.1 1.2  PROT 7.6 6.8  ALBUMIN 3.2* 2.7*    CBC:  Recent Labs Lab 08/31/15 0952 08/31/15 2344 09/01/15 0416  WBC 19.8* 20.4* 18.3*  HGB 9.6* 9.5* 9.4*  HCT 31.1* 29.8* 29.8*  MCV 99.7 99.0 98.0  PLT 146* 117* 112*    Cardiac Enzymes: No results for input(s): CKTOTAL, CKMB, CKMBINDEX, TROPONINI in the last 168 hours.  BNP:  Recent Labs  06/08/15 1619  PROBNP 370.0*    Coagulation:  Recent Labs Lab 08/31/15 0953 08/31/15 2035 09/01/15 0416  INR 1.59* 1.71*  1.95*    ECG:   Medications:   Scheduled Medications: . antiseptic oral rinse  7 mL Mouth Rinse BID  . atorvastatin  20 mg Oral Daily  . docusate sodium  100 mg Oral BID  . insulin aspart  0-15 Units Subcutaneous TID WC  . insulin glargine  5 Units Subcutaneous QHS  . pantoprazole  40 mg Oral BID AC  . piperacillin-tazobactam (ZOSYN)  IV  2.25 g Intravenous 3 times per day  . sodium chloride  3 mL Intravenous Q12H  . sodium chloride  3 mL Intravenous Q12H  . vancomycin  1,500 mg Intravenous Q48H  . Warfarin - Pharmacist Dosing Inpatient   Does not apply q1800     Infusions: . sodium chloride 10 mL/hr at 08/31/15 1941  . amiodarone 30 mg/hr (09/01/15 0207)  . heparin 1,250 Units/hr (09/01/15 0512)     PRN Medications:  sodium chloride, acetaminophen **OR** acetaminophen, sodium chloride     Assessment/Plan   1. Afib with RVR - management complicated by sepsis and low bp's. Staredt on amiodarone in short term, once bp's stabilized plan would be to change to av nodal agents. -bp's improved this AM, will start lopressor 2.5mg  IV tid with hold  parameters, continue amio today potentially stop tomorrow if bp's continue to hold - on heparin gtt while awaiting coumadin to be therapeutic, followed by pharm  2. Chronic diastolic HF - 03/3605 echo LVEF 55-60%, no WMAs, high LA pressure, mod RV dysfunction, PASP 93 - UOP not documented, strict I/Os ordered  - some evidence of volume overload on exam, given recent hypotension and sepsis will not diurese at this time. She remains asymptomatic, potentially start diuretics tomorrow if bp continues to hold.   3. Pulmonary HTN - RHC 08/2015, mean PA 45 with wedge of 33. Pulmonary HTN due to left sided heart disease - continue management of diastolic HF  4. Urosepsis - management per primary team     Carlyle Dolly, M.D.

## 2015-09-01 NOTE — Progress Notes (Signed)
[place] TEAM 1 - Stepdown/ICU TEAM Progress Note  PEIGHTYN ROBERSON QQI:[id number] DOB: [birth year] DOA: 08/31/2015 PCP: Maximino Greenland, MD  Admit HPI / Brief Narrative: Misa Fedorko is a 81 y.o. BF PMHx Pulmonary Hypertension, A. Fib with RVR, Diastolic CHF, HTN, Syncope, HLD, DM Type 2, GI bleed, Rt breast Ductal Carcinoma in Situ, DVT.  Presents to the ER today with vomiting and confusion. History is gathered from her daughter, [name] as the patient is lethargic and mildly confused. Per [name] the patient has been doing well since discharge on 08/17/15. During her admission for heart failure she was diuresed and her weight dropped from 270+ to 232. Yesterday the patient weighed 228. Yesterday evening the patient felt poorly, developed a fever and began to vomit. She was unable to eat lunch or supper yesterday and has had no breakfast or medications today. Today she has become confused (thinking her husband was still alive and living with her) and she has begun to cough up orange phlegm.   In the ER the patient has a temp of 102.2, pulse rate of 123, initial BP of 81/58. Her BP has responded to IVF and is currently SBP = 106. WBC is 19.8. Baseline creatinine is 1.08. Today (9/23) creatine is 1.77. CXR shows chronic pulm edema and a small pleural effusion.  HPI/Subjective: 9/24 A/O 4, negative CP, negative increased SOB (on 3 L O2 at home chronically), negative N/V. States upon admission had some CP  Assessment/Plan: Multisystem organ failure  Severe sepsis with organ dysfunction - urinary source/pyelonephritis Acute renal failure with likely pyelonephritis. BP soft but responding to fluid thus far. Will need close monitoring in stepdown. Given full code status I have a low threshold for calling CCM if her blood pressure drops again and does not respond to modest volume expansion.  Given careful volume resuscitation due to tenuous HF status, 1500 ml in the ER then 100  ml/hr x 20 hours. Blood and urine cultures obtained. Antibiotics started in the ER. Will change to Zosyn per sepsis order set. Follow lactic acid as I am concerned it may rise.  Acute renal failure with likely pyelonephritis Baseline creatinine is 1.08, current creatinine is 1.77. UA is positive for infection. Patient febrile at 102. Likely simple prerenal v/s ATN - follow w/ hydration.   Chronic diastolic heart failure/ Pulmonary HTN/cardiomyopathy/Tricuspid valve regurgitation Dry weight approximately 232. Recent echo on 08/09/15 showed LVEF of 55-60, with severely dilated right atrium and pulm artery filling pressure of 93 mm hg. Holding lasix for now. Given careful volume resuscitation. 1500 ml in the ER then 100 ml/hr x 20 hours. -Daily weights -Strict I's and O's, -CHF Team following   Atrial fibrillation with RVR -RVR likely due to infection plus patient's inability to take medications today. -Continue amiodarone drip  -Patient's HR still not well-controlled increase metoprolol 5 mg QID -Continue Coumadin per pharmacy. INR currently subtherapeutic.      Code Status: FULL Family Communication: family present at time of exam Disposition Plan: Home per family    Consultants: Dr.Jonathan F Branch (cardiology)    Procedure/Significant Events: 9/1 echocardiogram;- Left ventricle: severe concentric hypertrophy.-LVEF=  55% to 60%. - Right ventricle: moderately dilated - Right atrium:  severely dilated. - Tricuspid valve: moderate regurgitation. - Pulmonary arteries:  PApeak pressure: 93 mm Hg (S).    Culture   Antibiotics: Tressie Ellis 9/231 dose Zosyn 9/23>> Vancomycin 9/23>>  DVT prophylaxis: Heparin drip   Devices    LINES / TUBES:  Continuous Infusions: . amiodarone 30 mg/hr (09/01/15 1322)  . heparin 1,250 Units/hr (09/01/15 0512)    Objective: VITAL SIGNS: Temp: 98.5 F (36.9 C) (09/24 1630) Temp Source: Oral (09/24 1630) BP: 142/97  mmHg (09/24 1630) Pulse Rate: 136 (09/24 1630) SPO2; FIO2:   Intake/Output Summary (Last 24 hours) at 09/01/15 2020 Last data filed at 09/01/15 0800  Gross per 24 hour  Intake 642.12 ml  Output      0 ml  Net 642.12 ml     Exam: General: A/O 4, chronic respiratory distress, No acute respiratory distress, frail elderly patient Eyes: Negative headache, negative scleral hemorrhage ENT: Negative Runny nose, negative gingival bleeding, Neck:  Negative scars, masses, torticollis, lymphadenopathy, JVD Lungs: Clear to auscultation bilaterally without wheezes or crackles Cardiovascular: Irregular irregular rhythm and rate without murmur gallop or rub normal S1 and S2 Abdomen:negative abdominal pain, nondistended, positive soft, bowel sounds, no rebound, no ascites, no appreciable mass, left CVA tenderness Extremities: No significant cyanosis, clubbing, or edema bilateral lower extremities Psychiatric:  Negative depression, negative anxiety, negative fatigue, negative mania  Neurologic:  Cranial nerves II through XII intact, tongue/uvula midline, all extremities muscle strength 5/5, sensation intact throughout, negative dysarthria, negative expressive aphasia, negative receptive aphasia.   Data Reviewed: Basic Metabolic Panel:  Recent Labs Lab 08/28/15 1153 08/31/15 0952 08/31/15 2344 09/01/15 0416  NA 136 137 135 135  K 3.7 4.0 3.9 3.6  CL 90* 91* 92* 93*  CO2 39* 37* 30 33*  GLUCOSE 195* 211* 166* 161*  BUN 23* 28* 23* 24*  CREATININE 1.29* 1.77* 1.42* 1.40*  CALCIUM 8.7* 8.7* 8.3* 8.2*   Liver Function Tests:  Recent Labs Lab 08/31/15 0952 09/01/15 0416  AST 22 20  ALT 8* 7*  ALKPHOS 76 75  BILITOT 1.1 1.2  PROT 7.6 6.8  ALBUMIN 3.2* 2.7*   No results for input(s): LIPASE, AMYLASE in the last 168 hours. No results for input(s): AMMONIA in the last 168 hours. CBC:  Recent Labs Lab 08/31/15 0952 08/31/15 2344 09/01/15 0416  WBC 19.8* 20.4* 18.3*  NEUTROABS  16.4*  --   --   HGB 9.6* 9.5* 9.4*  HCT 31.1* 29.8* 29.8*  MCV 99.7 99.0 98.0  PLT 146* 117* 112*   Cardiac Enzymes: No results for input(s): CKTOTAL, CKMB, CKMBINDEX, TROPONINI in the last 168 hours. BNP (last 3 results)  Recent Labs  08/03/15 1314 08/09/15 1155 08/31/15 2035  BNP 570.3* 561.8* 874.5*    ProBNP (last 3 results)  Recent Labs  06/08/15 1619  PROBNP 370.0*    CBG:  Recent Labs Lab 08/31/15 1727 08/31/15 2033 09/01/15 0825 09/01/15 1215 09/01/15 1627  GLUCAP 131* 170* 120* 205* 82    Recent Results (from the past 240 hour(s))  Culture, blood (routine x 2)     Status: None (Preliminary result)   Collection Time: 08/31/15  9:40 AM  Result Value Ref Range Status   Specimen Description BLOOD RIGHT HAND  Final   Special Requests BOTTLES DRAWN AEROBIC AND ANAEROBIC 5CC  Final   Culture NO GROWTH 1 DAY  Final   Report Status PENDING  Incomplete  Culture, blood (routine x 2)     Status: None (Preliminary result)   Collection Time: 08/31/15  9:46 AM  Result Value Ref Range Status   Specimen Description BLOOD LEFT HAND  Final   Special Requests BOTTLES DRAWN AEROBIC AND ANAEROBIC 5CC  Final   Culture NO GROWTH 1 DAY  Final   Report Status PENDING  Incomplete  Urine culture     Status: None (Preliminary result)   Collection Time: 08/31/15 10:35 AM  Result Value Ref Range Status   Specimen Description URINE, CATHETERIZED  Final   Special Requests NONE  Final   Culture >=100,000 COLONIES/mL ESCHERICHIA COLI  Final   Report Status PENDING  Incomplete  MRSA PCR Screening     Status: None   Collection Time: 08/31/15  4:49 PM  Result Value Ref Range Status   MRSA by PCR NEGATIVE NEGATIVE Final    Comment:        The GeneXpert MRSA Assay (FDA approved for NASAL specimens only), is one component of a comprehensive MRSA colonization surveillance program. It is not intended to diagnose MRSA infection nor to guide or monitor treatment for MRSA  infections.      Studies:  Recent x-ray studies have been reviewed in detail by the Attending Physician  Scheduled Meds:  Scheduled Meds: . antiseptic oral rinse  7 mL Mouth Rinse BID  . atorvastatin  20 mg Oral Daily  . docusate sodium  100 mg Oral BID  . insulin aspart  0-15 Units Subcutaneous TID WC  . insulin glargine  5 Units Subcutaneous QHS  . metoprolol  5 mg Intravenous Q6H  . pantoprazole  40 mg Oral BID AC  . piperacillin-tazobactam (ZOSYN)  IV  3.375 g Intravenous 3 times per day  . sodium chloride  3 mL Intravenous Q12H  . sodium chloride  3 mL Intravenous Q12H  . vancomycin  1,500 mg Intravenous Q48H  . Warfarin - Pharmacist Dosing Inpatient   Does not apply q1800    Time spent on care of this patient: 40 mins   WOODS, Geraldo Docker , MD  [facility] Office  [phone] Pager - [phone]  On-Call/Text Page:      [email or web address]      password [email or web address]  If 7PM-7AM, please contact night-coverage [email or web address] Password [email or web address] 09/01/2015, 8:20 PM   LOS: 1 day   Care during the described time interval was provided by me .  I have reviewed this patient's available data, including medical history, events of note, physical examination, and all test results as part of my evaluation. I have personally reviewed and interpreted all radiology studies.   Dia Crawford, MD [phone] Pager

## 2015-09-01 NOTE — Progress Notes (Signed)
PT Cancellation Note  Patient Details Name: [name] MRN: [id number] DOB: January 20, 1926   Cancelled Treatment:    Reason Eval/Treat Not Completed: Medical issues which prohibited therapy.  Patient with HR at 123-145 at rest.  Will hold PT today.  Will return at later time for PT eval.   Despina Pole 09/01/2015, 3:16 PM Carita Pian. Sanjuana Kava, [place] Pager [phone]

## 2015-09-01 NOTE — Evaluation (Signed)
Clinical/Bedside Swallow Evaluation Patient Details  Name: [name] MRN: [id number] Date of Birth: 1926/06/03  Today's Date: 09/01/2015 Time: SLP Start Time (ACUTE ONLY): 1308 SLP Stop Time (ACUTE ONLY): 1320 SLP Time Calculation (min) (ACUTE ONLY): 12 min  Past Medical History:  Past Medical History  Diagnosis Date  . 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 DVT (deep venous thrombosis)   . Arthritis   . Hyperlipidemia   . Hypertension   . CHF (congestive heart failure)   . Anemia   . Cholelithiasis   . GI bleed   . Breast cancer 2012    right  . Peripheral neuropathy   . Heart murmur   . Syncope 05/09/2015  . Shortness of breath dyspnea   . Diabetes mellitus   . GERD (gastroesophageal reflux disease)    Past Surgical History:  Past Surgical History  Procedure Laterality Date  . Breast surgery    . Esophagogastroduodenoscopy  09/01/2012    Procedure: ESOPHAGOGASTRODUODENOSCOPY (EGD);  Surgeon: Ladene Artist, MD,FACG;  Location: [facility] ENDOSCOPY;  Service: Endoscopy;  Laterality: N/A;  . Esophagogastroduodenoscopy  09/03/2012    Procedure: ESOPHAGOGASTRODUODENOSCOPY (EGD);  Surgeon: Inda Castle, MD;  Location: [facility];  Service: Endoscopy;  Laterality: N/A;  . Endoscopic retrograde cholangiopancreatography (ercp) with propofol  10/14/2012    Procedure: ENDOSCOPIC RETROGRADE CHOLANGIOPANCREATOGRAPHY (ERCP) WITH PROPOFOL;  Surgeon: Milus Banister, MD;  Location: [facility] ENDOSCOPY;  Service: Endoscopy;  Laterality: N/A;  . Eus  10/14/2012    Procedure: UPPER ENDOSCOPIC ULTRASOUND (EUS) LINEAR;  Surgeon: Milus Banister, MD;  Location: [facility] ENDOSCOPY;  Service: Endoscopy;  Laterality: N/A;  . Eus  11/11/2012    Procedure: UPPER ENDOSCOPIC ULTRASOUND (EUS) LINEAR;  Surgeon: Milus Banister, MD;  Location: [facility] ENDOSCOPY;  Service: Endoscopy;  Laterality: N/A;  . Cataract extraction Left 05/08/2015  . Cardiac  catheterization N/A 05/11/2015    Procedure: Left Heart Cath and Coronary Angiography;  Surgeon: [name], MD;  Location: [facility];  Service: Cardiovascular;  Laterality: N/A;  . Cardiac catheterization N/A 08/09/2015    Procedure: Right Heart Cath;  Surgeon: [name], MD;  Location: [facility];  Service: Cardiovascular;  Laterality: N/A;   HPI:  [name] is a 81 y.o. female, with PHTN, D-HF, diabetes mellitus, with a history of GI bleed, DCIS. She presents to the ER with vomiting and confusion.  Dx severe sepsis with organ dysfunction, acute renal failure with likely pyelonephritis, afib, pulm HTN.   Assessment / Plan / Recommendation Clinical Impression  Pt presents with functional oropharyngeal swallow with mildly prolonged but adequate oral preparation of POs; brisk swallow response; no overt s/s of aspiration.  She has had a limited appetite, and declined all of the foods on her lunch tray, but the mechanics of her swallow appear to be wnl.  Recommend continuing current diet - SLP will sign off.           Diet Recommendation Dysphagia 3 (Mech soft);Thin   Medication Administration: Whole meds with liquid    Other  Recommendations Oral Care Recommendations: Oral care BID   Swallow Study Prior Functional Status       General Date of Onset: 08/31/15 Other Pertinent Information: [name] is a 81 y.o. female, with PHTN, D-HF, diabetes mellitus, with a history of GI bleed, DCIS. She presents to the ER with vomiting and confusion.  Dx severe sepsis with  organ dysfunction, acute renal failure with likely pyelonephritis, afib, pulm HTN. Type of Study: Bedside swallow evaluation Previous Swallow Assessment: no Diet Prior to this Study: Dysphagia 3 (soft);Thin liquids Temperature Spikes Noted: No Respiratory Status: Supplemental O2 delivered via (comment) History of Recent Intubation: No Behavior/Cognition: Alert;Cooperative Oral Cavity -  Dentition: Missing dentition Self-Feeding Abilities: Needs assist;Able to feed self Patient Positioning: Upright in bed Baseline Vocal Quality: Normal Volitional Cough: Strong Volitional Swallow: Able to elicit    Oral/Motor/Sensory Function Overall Oral Motor/Sensory Function: Appears within functional limits for tasks assessed   Ice Chips Ice chips: Within functional limits   Thin Liquid Thin Liquid: Within functional limits Presentation: Straw    Nectar Thick Nectar Thick Liquid: Not tested   Honey Thick Honey Thick Liquid: Not tested   Puree Puree: Within functional limits Presentation: [place]. [place], [place] CCC/SLP Pager [phone]     Solid: Not tested       [name] 09/01/2015,1:23 PM

## 2015-09-01 NOTE — Progress Notes (Signed)
ANTICOAGULATION CONSULT NOTE - Follow Up Consult  Pharmacy Consult for heparin Indication: atrial fibrillation   Labs:  Recent Labs  08/31/15 0952 08/31/15 0953 08/31/15 2035 08/31/15 2344 09/01/15 0416  HGB 9.6*  --   --  9.5* 9.4*  HCT 31.1*  --   --  29.8* 29.8*  PLT 146*  --   --  117* 112*  APTT  --   --  35  --   --   LABPROT  --  19.0* 20.1*  --  22.1*  INR  --  1.59* 1.71*  --  1.95*  HEPARINUNFRC  --   --   --   --  0.15*  CREATININE 1.77*  --   --  1.42* 1.40*     Assessment: 81yo female subtherapeutic on heparin with initial dosing for low INR.  Goal of Therapy:  Heparin level 0.3-0.7 units/ml   Plan:  Will increase gtt by ~3 units/kg/hr to 1250 units/hr and check level in [place], PharmD, BCPS  09/01/2015,5:09 AM

## 2015-09-01 NOTE — Progress Notes (Signed)
Utilization review completed.  

## 2015-09-01 NOTE — Consult Note (Signed)
Consultation Note Date: 09/01/2015   Patient Name: [name]  DOB: June 23, 1926  MRN: [id number]  Age / Sex: 81 y.o., female   PCP: Diane Chard, MD Referring Physician: Allie Bossier, MD  Reason for Consultation: Establishing goals of care  Palliative Care Assessment and Plan Summary of Established Goals of Care and Medical Treatment Preferences   Clinical Assessment/Narrative: Pt is a 81 yo female admitted with vomiting and confusion. Her temp in ED was 102.2, BP 81/58. She responded well to fluid bolus and BP improved. Pt has a PMT of pulmonary HTN, dCHF, a-fib, MR. Her Lactic acid was 1.95, pulmonary pressure 93 vs 76 on 8/26. Her creatinine 1.77, and WBC in the 20's. She has been started abx and WBC's trending down slightly to 18.3 today as well; as lactic acid improving. Pt is alert. She can answer simple questions but overall defers conversations to her family. In the ED pt was heard to say she would not want to be intubated but daughter, [name], stated this was not true, that she was a full code, which is what her status is listed as. She has multi-system dysfunction and asked to see pt and family for [name] in this setting. Pt seen with multiple family members present : her daughter [name], [name], son [name], as well as SIL, DIL and granddaughter. Pt has another son who is sick and could not be present but is in contact with his siblings. [name] is the spokesperson for the family but they do all seem in agreement. She is very quick to state that pt's goal is Full Code including intubation. Pt herself does not participate in this level of conversation and closes her eyes when I attempt to question her directly. [name] husband is a HD patient, double amputee as well as having the experience of needing ventilation and coming off successfully. This is likely a factor in family's decision as well as their faith. She and her siblings state they know  how sick she is but feel " for now" this is our decision. Privately later [name] tells me she knows she is losing her mother, and her hope is that she passes peacefully without aggressive measures. Pt was living independently up until June and has been WC bound since living with her daughter Diane Dr. Sherral Hammers joins our conversation and we at that point step out of the room to address further pt's status. Family still in agreement for Full Code as well as all measures. Pt's daughter [name] shares she is a DNR, and son [name], very emotional and crying . I did introduce the idea of hospice in the home as a supportive option. Family polite but not receptive at this time  Contacts/Participants in Discussion: Primary Decision Maker: The 4 children   HCPOA: no  4 children as noted above and SIL, DIL and granddaughter.   Code Status/Advance Care Planning:  Full code including intubation and pressors  Transfer to ICU if necesary  Symptom Management:   Pain: No acute pain. Cont to monitor  Dyspnea: No work of breathing   Additional Recommendations (Limitations, Scope, Preferences):  To continue to process clinical condition going forward as a family, especially given [name]'s statement that she hopes she dies peacefully without intubation, etc.   Recommend Palliative Care follow and monitor for decline. I suspect if [name] clearly presents herself as dying family might reconsider their current care plan decisions Psycho-social/Spiritual:   Support System: yes  Desire for further Chaplaincy support:no  Prognosis: < 6 months. Likely meets hospice in-home criteria for services  Discharge Planning:  Home with [place]       Chief Complaint/History of Present Illness: Pt is a 81 yo female admitted with confusion, n/v and fever. She is septic, likely pyelonephritis.   Primary Diagnoses  Present on Admission:  . Pulmonary hypertension, moderate to severe . Mitral valve  regurgitation with secondary PH . Severe sepsis with acute organ dysfunction . Pyelonephritis . HYPERCHOLESTEROLEMIA . Chronic atrial fibrillation- CHADs VASc =6 . Atrial fibrillation with RVR . Acute renal failure  Palliative Review of Systems: Minimally conversant but she does deny pain and dyspnea. No further n/v I have reviewed the medical record, interviewed the patient and family, and examined the patient. The following aspects are pertinent.  Past Medical History  Diagnosis Date  . 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 DVT (deep venous thrombosis)   . Arthritis   . Hyperlipidemia   . Hypertension   . CHF (congestive heart failure)   . Anemia   . Cholelithiasis   . GI bleed   . Breast cancer 2012    right  . Peripheral neuropathy   . Heart murmur   . Syncope 05/09/2015  . Shortness of breath dyspnea   . Diabetes mellitus   . GERD (gastroesophageal reflux disease)    Social History   Social History  . Marital Status: Widowed    Spouse Name: N/A  . Number of Children: 4  . Years of Education: N/A   Occupational History  . RETIRED    Social History Main Topics  . Smoking status: Never Smoker   . Smokeless tobacco: Never Used  . Alcohol Use: No  . Drug Use: No  . Sexual Activity: Not Currently   Other Topics Concern  . None   Social History Narrative   History reviewed. No pertinent family history. Scheduled Meds: . antiseptic oral rinse  7 mL Mouth Rinse BID  . atorvastatin  20 mg Oral Daily  . docusate sodium  100 mg Oral BID  . insulin aspart  0-15 Units Subcutaneous TID WC  . insulin glargine  5 Units Subcutaneous QHS  . metoprolol  2.5 mg Intravenous 3 times per day  . pantoprazole  40 mg Oral BID AC  . piperacillin-tazobactam (ZOSYN)  IV  2.25 g Intravenous 3 times per day  . sodium chloride  3 mL Intravenous Q12H  . sodium chloride  3 mL Intravenous Q12H  . vancomycin  1,500 mg  Intravenous Q48H  . Warfarin - Pharmacist Dosing Inpatient   Does not apply q1800   Continuous Infusions: . amiodarone 30 mg/hr (09/01/15 1322)  . heparin 1,250 Units/hr (09/01/15 0512)   PRN Meds:.sodium chloride, acetaminophen **OR** acetaminophen, sodium chloride Medications Prior to Admission:  Prior to Admission medications   Medication Sig Start Date End Date Taking? Authorizing Provider  acetaminophen (TYLENOL) 500 MG tablet Take 1,000 mg by mouth 2 (two) times daily as needed for mild pain.    Yes Historical Provider, MD  atorvastatin (LIPITOR) 20 MG tablet Take 20 mg by mouth daily.   Yes Historical Provider, MD  gabapentin (NEURONTIN) 300 MG capsule Take 300 mg by mouth at bedtime.  04/09/15  Yes Historical Provider, MD  metoprolol tartrate (LOPRESSOR) 25 MG tablet Take 0.5 tablets (12.5 mg total) by mouth 2 (two) times daily. 08/17/15  Yes Satira Mccallum  T[place], PA-C  pantoprazole (PROTONIX) 40 MG tablet Take 1 tablet (40 mg total) by mouth 2 (two) times daily before a meal. Patient taking differently: Take 40 mg by mouth 2 (two) times daily.  09/07/12  Yes [name], MD  potassium chloride SA (KLOR-CON M20) 20 MEQ tablet Take 2 tablets (40 mEq total) by mouth daily. 08/17/15  Yes Shirley Friar, PA-C  torsemide (DEMADEX) 20 MG tablet Take 2 tablets (40 mg total) every morning, and 1 tablet (20 mg) in the evening. 08/17/15  Yes Shirley Friar, PA-C  warfarin (COUMADIN) 2 MG tablet Take 2-4 mg by mouth daily. Take 2 mg daily except 4 mg on Thursday's and and Saturday's.   Yes Historical Provider, MD   Allergies  Allergen Reactions  . Peach [Prunus Persica] Rash    AGENT: peach fuzz   CBC:    Component Value Date/Time   WBC 18.3* 09/01/2015 0416   WBC 6.8 05/03/2015 1422   HGB 9.4* 09/01/2015 0416   HGB 10.9* 05/03/2015 1422   HCT 29.8* 09/01/2015 0416   HCT 34.9 05/03/2015 1422   PLT 112* 09/01/2015 0416   PLT 169 05/03/2015 1422   MCV 98.0 09/01/2015 0416     MCV 93.1 05/03/2015 1422   NEUTROABS 16.4* 08/31/2015 0952   NEUTROABS 3.6 05/03/2015 1422   LYMPHSABS 1.8 08/31/2015 0952   LYMPHSABS 2.3 05/03/2015 1422   MONOABS 1.5* 08/31/2015 0952   MONOABS 0.6 05/03/2015 1422   EOSABS 0.0 08/31/2015 0952   EOSABS 0.2 05/03/2015 1422   BASOSABS 0.0 08/31/2015 0952   BASOSABS 0.0 05/03/2015 1422   Comprehensive Metabolic Panel:    Component Value Date/Time   NA 135 09/01/2015 0416   NA 137 05/03/2015 1422   K 3.6 09/01/2015 0416   K 4.5 05/03/2015 1422   CL 93* 09/01/2015 0416   CL 104 04/22/2013 1125   CO2 33* 09/01/2015 0416   CO2 26 05/03/2015 1422   BUN 24* 09/01/2015 0416   BUN 18.8 05/03/2015 1422   CREATININE 1.40* 09/01/2015 0416   CREATININE 1.4* 05/03/2015 1422   GLUCOSE 161* 09/01/2015 0416   GLUCOSE 104 05/03/2015 1422   GLUCOSE 102* 04/22/2013 1125   CALCIUM 8.2* 09/01/2015 0416   CALCIUM 8.5 05/03/2015 1422   AST 20 09/01/2015 0416   AST 15 05/03/2015 1422   ALT 7* 09/01/2015 0416   ALT 6 05/03/2015 1422   ALKPHOS 75 09/01/2015 0416   ALKPHOS 72 05/03/2015 1422   BILITOT 1.2 09/01/2015 0416   BILITOT 0.90 05/03/2015 1422   PROT 6.8 09/01/2015 0416   PROT 7.4 05/03/2015 1422   ALBUMIN 2.7* 09/01/2015 0416   ALBUMIN 3.5 05/03/2015 1422    Physical Exam: Vital Signs: BP 160/48 mmHg  Pulse 114  Temp(Src) 99 F (37.2 C) (Oral)  Resp 23  Ht 5\' 2"  (1.575 m)  Wt 106.8 kg (235 lb 7.2 oz)  BMI 43.05 kg/m2  SpO2 100% SpO2: SpO2: 100 % O2 Device: O2 Device: Nasal Cannula O2 Flow Rate: O2 Flow Rate (L/min): 3 L/min Intake/output summary:  Intake/Output Summary (Last 24 hours) at 09/01/15 1342 Last data filed at 09/01/15 0800  Gross per 24 hour  Intake 1061.62 ml  Output      0 ml  Net 1061.62 ml   LBM:   Baseline Weight: Weight: 104.2 kg (229 lb 11.5 oz) Most recent weight: Weight: 106.8 kg (235 lb 7.2 oz)  Exam Findings:  General: Well nourished elderly female. Somnolent but awakens easily and  can  answer simple questions only Resp: No work of breathing at rest         Palliative Performance Scale: 30-40              Additional Data Reviewed: Recent Labs     08/31/15  2344  09/01/15  0416  WBC  20.4*  18.3*  HGB  9.5*  9.4*  PLT  117*  112*  NA  135  135  BUN  23*  24*  CREATININE  1.42*  1.40*     Time In: 1200 Time Out: 1315 Time Total: 75 min Greater than 50%  of this time was spent counseling and coordinating care related to the above assessment and plan. Staffed with Dr. Sherral Hammers  Signed by: Dory Horn, NP  Dory Horn, NP  09/01/2015, 1:42 PM  Please contact Palliative Medicine Team phone at [phone] for questions and concerns.

## 2015-09-01 NOTE — Progress Notes (Addendum)
ANTICOAGULATION CONSULT NOTE - Follow Up Consult  Pharmacy Consult for Heparin anc Coumadin Indication: atrial fibrillation  Allergies  Allergen Reactions  . Peach [Prunus Persica] Rash    AGENT: peach fuzz    Patient Measurements: Height: 5\' 2"  (157.5 cm) Weight: 235 lb 7.2 oz (106.8 kg) IBW/kg (Calculated) : 50.1 Heparin Dosing Weight:   Vital Signs: Temp: 99 F (37.2 C) (09/24 1200) Temp Source: Oral (09/24 1200) BP: 160/48 mmHg (09/24 1100) Pulse Rate: 114 (09/24 1200)  Labs:  Recent Labs  08/31/15 7408 08/31/15 0953 08/31/15 2035 08/31/15 2344 09/01/15 0416 09/01/15 1240  HGB 9.6*  --   --  9.5* 9.4*  --   HCT 31.1*  --   --  29.8* 29.8*  --   PLT 146*  --   --  117* 112*  --   APTT  --   --  35  --   --   --   LABPROT  --  19.0* 20.1*  --  22.1*  --   INR  --  1.59* 1.71*  --  1.95*  --   HEPARINUNFRC  --   --   --   --  0.15* 0.28*  CREATININE 1.77*  --   --  1.42* 1.40*  --     Estimated Creatinine Clearance: 33.2 mL/min (by C-G formula based on Cr of 1.4).   Medications:  Scheduled:  . antiseptic oral rinse  7 mL Mouth Rinse BID  . atorvastatin  20 mg Oral Daily  . docusate sodium  100 mg Oral BID  . insulin aspart  0-15 Units Subcutaneous TID WC  . insulin glargine  5 Units Subcutaneous QHS  . metoprolol  2.5 mg Intravenous 3 times per day  . pantoprazole  40 mg Oral BID AC  . piperacillin-tazobactam (ZOSYN)  IV  2.25 g Intravenous 3 times per day  . sodium chloride  3 mL Intravenous Q12H  . sodium chloride  3 mL Intravenous Q12H  . vancomycin  1,500 mg Intravenous Q48H  . Warfarin - Pharmacist Dosing Inpatient   Does not apply q1800    Assessment: 81yo female on Coumadin pta for AFib, admitted with subtherapeutic INR.  Heparin added as developed RVR, to bridge to therapeutic INR.  Amiodarone has been added as well, a new medication for this pt which may increase Coumadin effect.   Heparin level 0.28, INR 1.95, Hg and pltc stable.  No  bleeding noted.  Per d/w RN, pt has been occluding Heparin IV with pump alerting due to bending her arm.  Measures have been taken to prevent this and it has been happening less frequently than early AM; HL is nearly at goal  Goal of Therapy:  INR 2-3 Heparin level 0.3-0.7 units/ml Monitor platelets by anticoagulation protocol: Yes   Plan:  Cont Heparin 1250 units/hr Coumadin 2mg  Repeat HL 6hr Daily HL, CBC, INR Watch for s/s of bleeding  Gracy Bruins, PharmD Clinical Pharmacist [facility]   ________________________________________ Addendum:  HL 0.28, increase to 1350 hl with am labs  Levester Fresh, PharmD, [facility] Clinical Pharmacist Pager [phone] 09/01/2015 7:38 PM

## 2015-09-02 DIAGNOSIS — E876 Hypokalemia: Secondary | ICD-10-CM

## 2015-09-02 DIAGNOSIS — A4151 Sepsis due to Escherichia coli [E. coli]: Principal | ICD-10-CM

## 2015-09-02 LAB — COMPREHENSIVE METABOLIC PANEL
ALBUMIN: 2.5 g/dL — AB (ref 3.5–5.0)
ALK PHOS: 70 U/L (ref 38–126)
ALT: 8 U/L — AB (ref 14–54)
ANION GAP: 9 (ref 5–15)
AST: 22 U/L (ref 15–41)
BUN: 22 mg/dL — ABNORMAL HIGH (ref 6–20)
CALCIUM: 8 mg/dL — AB (ref 8.9–10.3)
CO2: 33 mmol/L — AB (ref 22–32)
Chloride: 91 mmol/L — ABNORMAL LOW (ref 101–111)
Creatinine, Ser: 1.37 mg/dL — ABNORMAL HIGH (ref 0.44–1.00)
GFR calc Af Amer: 39 mL/min — ABNORMAL LOW (ref >60)
GFR calc non Af Amer: 34 mL/min — ABNORMAL LOW (ref >60)
GLUCOSE: 92 mg/dL (ref 65–99)
Potassium: 3.1 mmol/L — ABNORMAL LOW (ref 3.5–5.1)
SODIUM: 133 mmol/L — AB (ref 135–145)
Total Bilirubin: 1.5 mg/dL — ABNORMAL HIGH (ref 0.3–1.2)
Total Protein: 6.9 g/dL (ref 6.5–8.1)

## 2015-09-02 LAB — CBC WITH DIFFERENTIAL/PLATELET
BASOS PCT: 0 %
Basophils Absolute: 0 10*3/uL (ref 0.0–0.1)
Eosinophils Absolute: 0 10*3/uL (ref 0.0–0.7)
Eosinophils Relative: 0 %
HEMATOCRIT: 29 % — AB (ref 36.0–46.0)
Hemoglobin: 9.1 g/dL — ABNORMAL LOW (ref 12.0–15.0)
Lymphocytes Relative: 17 %
Lymphs Abs: 2 10*3/uL (ref 0.7–4.0)
MCH: 30.7 pg (ref 26.0–34.0)
MCHC: 31.4 g/dL (ref 30.0–36.0)
MCV: 98 fL (ref 78.0–100.0)
MONO ABS: 1 10*3/uL (ref 0.1–1.0)
MONOS PCT: 9 %
NEUTROS ABS: 8.7 10*3/uL — AB (ref 1.7–7.7)
Neutrophils Relative %: 74 %
Platelets: 112 10*3/uL — ABNORMAL LOW (ref 150–400)
RBC: 2.96 MIL/uL — ABNORMAL LOW (ref 3.87–5.11)
RDW: 13.8 % (ref 11.5–15.5)
WBC: 11.8 10*3/uL — ABNORMAL HIGH (ref 4.0–10.5)

## 2015-09-02 LAB — GLUCOSE, CAPILLARY
Glucose-Capillary: 67 mg/dL (ref 65–99)
Glucose-Capillary: 97 mg/dL (ref 65–99)
Glucose-Capillary: 127 mg/dL — ABNORMAL HIGH (ref 65–99)
Glucose-Capillary: 97 mg/dL (ref 65–99)

## 2015-09-02 LAB — URINE CULTURE

## 2015-09-02 LAB — PROTIME-INR
INR: 2.26 — ABNORMAL HIGH (ref 0.00–1.49)
Prothrombin Time: 24.7 seconds — ABNORMAL HIGH (ref 11.6–15.2)

## 2015-09-02 LAB — HEPARIN LEVEL (UNFRACTIONATED): Heparin Unfractionated: 0.11 IU/mL — ABNORMAL LOW (ref 0.30–0.70)

## 2015-09-02 LAB — MAGNESIUM: Magnesium: 1.7 mg/dL (ref 1.7–2.4)

## 2015-09-02 LAB — LACTIC ACID, PLASMA: LACTIC ACID, VENOUS: 1.1 mmol/L (ref 0.5–2.0)

## 2015-09-02 MED ORDER — POTASSIUM CHLORIDE 10 MEQ/100ML IV SOLN
10.0000 meq | INTRAVENOUS | Status: AC
  Administered 2015-09-02 (×3): 10 meq via INTRAVENOUS
  Filled 2015-09-02 (×4): qty 100

## 2015-09-02 MED ORDER — POTASSIUM CHLORIDE 10 MEQ/100ML IV SOLN
10.0000 meq | INTRAVENOUS | Status: AC
  Administered 2015-09-02 (×2): 10 meq via INTRAVENOUS
  Filled 2015-09-02: qty 100

## 2015-09-02 MED ORDER — DEXTROSE 50 % IV SOLN
INTRAVENOUS | Status: AC
  Administered 2015-09-02: 50 mL
  Filled 2015-09-02: qty 50

## 2015-09-02 MED ORDER — ACETAMINOPHEN 325 MG PO TABS
325.0000 mg | ORAL_TABLET | Freq: Once | ORAL | Status: AC
  Administered 2015-09-02: 325 mg via ORAL

## 2015-09-02 MED ORDER — FUROSEMIDE 10 MG/ML IJ SOLN
40.0000 mg | Freq: Once | INTRAMUSCULAR | Status: AC
  Administered 2015-09-02: 40 mg via INTRAVENOUS

## 2015-09-02 MED ORDER — MAGNESIUM SULFATE 2 GM/50ML IV SOLN
2.0000 g | Freq: Once | INTRAVENOUS | Status: AC
  Administered 2015-09-02: 2 g via INTRAVENOUS
  Filled 2015-09-02: qty 50

## 2015-09-02 MED ORDER — WARFARIN SODIUM 2 MG PO TABS
2.0000 mg | ORAL_TABLET | Freq: Once | ORAL | Status: AC
  Administered 2015-09-02: 2 mg via ORAL
  Filled 2015-09-02: qty 1

## 2015-09-02 MED ORDER — DEXTROSE 5 % IV SOLN
2.0000 g | INTRAVENOUS | Status: DC
  Administered 2015-09-02 – 2015-09-05 (×4): 2 g via INTRAVENOUS
  Filled 2015-09-02 (×4): qty 2

## 2015-09-02 NOTE — Progress Notes (Signed)
ANTICOAGULATION and ANTIBIOTIC CONSULT NOTE - Follow Up Consult  Pharmacy Consult for Heparin, Coumadin, and Rocephin Indication: atrial fibrillation and EColi UTI  Allergies  Allergen Reactions  . Peach [Prunus Persica] Rash    AGENT: peach fuzz    Patient Measurements: Height: 5\' 2"  (157.5 cm) Weight: 235 lb 3.7 oz (106.7 kg) IBW/kg (Calculated) : 50.1 Heparin Dosing Weight:   Vital Signs: Temp: 98 F (36.7 C) (09/25 0829) Temp Source: Oral (09/25 0829) BP: 127/68 mmHg (09/25 0829) Pulse Rate: 34 (09/25 0829)  Labs:  Recent Labs  08/31/15 2035 08/31/15 2344  09/01/15 0416 09/01/15 1240 09/01/15 1844 09/02/15 0333  HGB  --  9.5*  --  9.4*  --   --  9.1*  HCT  --  29.8*  --  29.8*  --   --  29.0*  PLT  --  117*  --  112*  --   --  112*  APTT 35  --   --   --   --   --   --   LABPROT 20.1*  --   --  22.1*  --   --  24.7*  INR 1.71*  --   --  1.95*  --   --  2.26*  HEPARINUNFRC  --   --   < > 0.15* 0.28* 0.28* 0.11*  CREATININE  --  1.42*  --  1.40*  --   --  1.37*  < > = values in this interval not displayed.  Estimated Creatinine Clearance: 33.8 mL/min (by C-G formula based on Cr of 1.37).   Assessment: 81yo female on Heparin bridge to Coumadin for AFib.  Heparin level was low this AM, but INR was therapeutic.  I paged MD, ok to d/c heparin.  INR 2.26, Hg 9.4, pltc 112.  No bleeding noted.  Pt is on Amiodarone, which may inc INR  Pt has been receiving Vancomycin and Zosyn, now changing to Rocephin for [facility] UTI.  No dosage adjustments required for renal fxn.  Today will be day#3 of antibiotics.  Goal of Therapy:  INR 2-3 Treatment of UTI Monitor platelets by anticoagulation protocol: Yes   Plan:  Rocephin 2g IV q24 D/C heparin and all heparin labs Repeat Coumadin 2mg  Daily INR Watch for s/s of bleeding  Gracy Bruins, PharmD Clinical Pharmacist [facility]

## 2015-09-02 NOTE — Progress Notes (Signed)
Patient ID: [name], female   DOB: [birth year], 81 y.o.   MRN: [id number]    Subjective:    No complaints  Objective:   Temp:  [97.1 F (36.2 C)-101.5 F (38.6 C)] 97.1 F (36.2 C) (09/25 0444) Pulse Rate:  [94-145] 94 (09/25 0444) Resp:  [18-36] 19 (09/25 0444) BP: (99-160)/(38-97) 117/52 mmHg (09/25 0444) SpO2:  [96 %-100 %] 100 % (09/25 0444) Weight:  [235 lb 3.7 oz (106.7 kg)] 235 lb 3.7 oz (106.7 kg) (09/25 0500)    Filed Weights   08/31/15 1627 09/01/15 0500 09/02/15 0500  Weight: 229 lb 11.5 oz (104.2 kg) 235 lb 7.2 oz (106.8 kg) 235 lb 3.7 oz (106.7 kg)    Intake/Output Summary (Last 24 hours) at 09/02/15 0550 Last data filed at 09/01/15 2320  Gross per 24 hour  Intake  434.1 ml  Output      0 ml  Net  434.1 ml    Telemetry: afib rates 80s-100s  Exam:  General: NAD  Resp: crackles bilateral bases  Cardiac: irreg, no m/r/g, +elevated JVD  GI: abdomen soft, NT, ND  MSK: 1+ bilateral LE edema  Neuro: no focal deficits   Lab Results:  Basic Metabolic Panel:  Recent Labs Lab 08/31/15 2344 09/01/15 0416 09/02/15 0333  NA 135 135 133*  K 3.9 3.6 3.1*  CL 92* 93* 91*  CO2 30 33* 33*  GLUCOSE 166* 161* 92  BUN 23* 24* 22*  CREATININE 1.42* 1.40* 1.37*  CALCIUM 8.3* 8.2* 8.0*  MG  --   --  1.7    Liver Function Tests:  Recent Labs Lab 08/31/15 0952 09/01/15 0416 09/02/15 0333  AST 22 20 22   ALT 8* 7* 8*  ALKPHOS 76 75 70  BILITOT 1.1 1.2 1.5*  PROT 7.6 6.8 6.9  ALBUMIN 3.2* 2.7* 2.5*    CBC:  Recent Labs Lab 08/31/15 2344 09/01/15 0416 09/02/15 0333  WBC 20.4* 18.3* 11.8*  HGB 9.5* 9.4* 9.1*  HCT 29.8* 29.8* 29.0*  MCV 99.0 98.0 98.0  PLT 117* 112* 112*    Cardiac Enzymes: No results for input(s): CKTOTAL, CKMB, CKMBINDEX, TROPONINI in the last 168 hours.  BNP:  Recent Labs  06/08/15 1619  PROBNP 370.0*    Coagulation:  Recent Labs Lab 08/31/15 2035 09/01/15 0416 09/02/15 0333  INR 1.71* 1.95*  2.26*    ECG:   Medications:   Scheduled Medications: . antiseptic oral rinse  7 mL Mouth Rinse BID  . atorvastatin  20 mg Oral Daily  . docusate sodium  100 mg Oral BID  . insulin aspart  0-15 Units Subcutaneous TID WC  . insulin glargine  5 Units Subcutaneous QHS  . metoprolol  5 mg Intravenous Q6H  . pantoprazole  40 mg Oral BID AC  . piperacillin-tazobactam (ZOSYN)  IV  3.375 g Intravenous 3 times per day  . sodium chloride  3 mL Intravenous Q12H  . sodium chloride  3 mL Intravenous Q12H  . vancomycin  1,500 mg Intravenous Q48H  . Warfarin - Pharmacist Dosing Inpatient   Does not apply q1800     Infusions: . amiodarone 30 mg/hr (09/01/15 2320)  . heparin 1,350 Units/hr (09/01/15 2112)     PRN Medications:  sodium chloride, acetaminophen **OR** acetaminophen, sodium chloride     Assessment/Plan    1. Afib with RVR - management complicated by sepsis and low bp's. Started on amiodarone in short term - bp's improved yesterday, started on lopressor 2.5mg  tid. Increased to  5mg  q6 hrs later in the day. Can change to oral once she is taking PO more regularly.  - on heparin gtt while awaiting coumadin to be therapeutic, followed by pharm - will stop amio  2. Chronic diastolic HF - 04/8831 echo LVEF 55-60%, no WMAs, high LA pressure, mod RV dysfunction, PASP 93 - UOP not documented, strict I/Os ordered  - some evidence of volume overload on exam with LE edema and JVD. Will give lasix IV 40mg  x 1 today, follow response and likely further diuresis over next few days as her hemodynamics continue to stabilize from recent infection. Follow renal function, baseline appears to be 1-1.2. Recent AKI in setting of infection that is resolving, will try gentle diuresis.   3. Pulmonary HTN - RHC 08/2015, mean PA 45 with wedge of 33. Pulmonary HTN due to left sided heart disease - continue management of diastolic HF  4. Urosepsis - management per primary team       Carlyle Dolly, M.D.

## 2015-09-02 NOTE — Evaluation (Signed)
Physical Therapy Evaluation Patient Details Name: [name] MRN: [id number] DOB: [birth year] Today's Date: 09/02/2015   History of Present Illness  Patient is an 81 yo female admitted 08/31/15 with severe sepsis, confusion, Afib with RVR, CHF.    PMH:  HTN, CHF, DM, neuropathy, Afib   Clinical Impression  Patient presents with problems listed below.  Will benefit from acute PT to address mobility prior to discharge.  Patient requiring +2 for mobility today.  Poor tolerance to activity with increased HR and resp rate.   Recommend SNF at discharge for continued therapy.  If declines SNF, would recommend HHPT and hospital bed for home.    Follow Up Recommendations SNF;Supervision/Assistance - 24 hour    Equipment Recommendations  Hospital bed    Recommendations for Other Services       Precautions / Restrictions Precautions Precautions: Fall Precaution Comments: Watch HR Restrictions Weight Bearing Restrictions: No      Mobility  Bed Mobility Overal bed mobility: Needs Assistance;+2 for physical assistance Bed Mobility: Rolling Rolling: Mod assist;+2 for physical assistance         General bed mobility comments: Patient resting in supine with HR 117-124.  Provided verbal cues for bed mobility technique.  Patient able to reach for rail with RUE.  Required +2 assist to roll upper body and LE's.  While in sidelying, patient's HR increased to 141 and resp rate to 35.  Patient reports difficulty breathing.  Returned to supine and repositioned.    Transfers                    Ambulation/Gait                Stairs            Wheelchair Mobility    Modified Rankin (Stroke Patients Only)       Balance                                             Pertinent Vitals/Pain Pain Assessment: No/denies pain    Home Living Family/patient expects to be discharged to:: Private residence Living Arrangements: Children  (Daughter) Available Help at Discharge: Family;Available 24 hours/day Type of Home: House Home Access: Ramped entrance     Home Layout: Two level;Bed/bath upstairs Home Equipment: Walker - 2 wheels;Walker - 4 wheels;Wheelchair - [employer];Shower seat      Prior Function Level of Independence: Needs assistance   Gait / Transfers Assistance Needed: Ambulating short distances with RW and assist since June.  Using w/c.  Has been walking up stairs with 2 person assist  ADL's / Homemaking Assistance Needed: Daughter assists with bathing/dressing.  Daughter does meal prep and housekeeping        Hand Dominance   Dominant Hand: Right    Extremity/Trunk Assessment   Upper Extremity Assessment: RUE deficits/detail;LUE deficits/detail RUE Deficits / Details: Strength grossly 3-/5.  Decreased shoulder flexion     LUE Deficits / Details: Strength grossly 2+/5.  Very limited AROM Lt shoulder - prior injury   Lower Extremity Assessment: RLE deficits/detail;LLE deficits/detail RLE Deficits / Details: Strength grossly 3-/5 LLE Deficits / Details: Strength grossly 2+/5     Communication   Communication: No difficulties  Cognition Arousal/Alertness: Awake/alert Behavior During Therapy: WFL for tasks assessed/performed;Flat affect Overall Cognitive Status: Within Functional Limits for tasks assessed (Oriented x3)  General Comments      Exercises        Assessment/Plan    PT Assessment Patient needs continued PT services  PT Diagnosis Difficulty walking;Generalized weakness   PT Problem List Decreased strength;Decreased activity tolerance;Decreased range of motion;Decreased balance;Decreased mobility;Decreased knowledge of use of DME;Cardiopulmonary status limiting activity;Obesity  PT Treatment Interventions DME instruction;Gait training;Functional mobility training;Therapeutic activities;Therapeutic exercise;Balance  training;Patient/family education   PT Goals (Current goals can be found in the Care Plan section) Acute Rehab PT Goals Patient Stated Goal: None stated PT Goal Formulation: With patient Time For Goal Achievement: 09/09/15 Potential to Achieve Goals: Fair    Frequency Min 3X/week   Barriers to discharge        Co-evaluation               End of Session Equipment Utilized During Treatment: Oxygen Activity Tolerance: Patient limited by fatigue;Treatment limited secondary to medical complications (Comment) (HR increased to 141 and resp rate to 35) Patient left: in bed;with call bell/phone within reach Nurse Communication: Mobility status (Vital signs with mobility)         Time: 8022-3361 PT Time Calculation (min) (ACUTE ONLY): 14 min   Charges:   PT Evaluation $Initial PT Evaluation Tier I: 1 Procedure     PT G CodesDespina Pole 2015-10-01, 1:19 PM Carita Pian. Sanjuana Kava, [place] Pager [phone]

## 2015-09-02 NOTE — Progress Notes (Signed)
[place] TEAM 1 - Stepdown/ICU TEAM Progress Note  [name] ZSW:[id number] DOB: [birth year] DOA: 08/31/2015 PCP: Maximino Greenland, MD  Admit HPI / Brief Narrative: [name] is a 81 y.o. BF PMHx Pulmonary Hypertension, A. Fib with RVR, Diastolic CHF, HTN, Syncope, HLD, DM Type 2, GI bleed, Rt breast Ductal Carcinoma in Situ, DVT.  Presents to the ER today with vomiting and confusion. History is gathered from her daughter, [name] as the patient is lethargic and mildly confused. Per [name] the patient has been doing well since discharge on 08/17/15. During her admission for heart failure she was diuresed and her weight dropped from 270+ to 232. Yesterday the patient weighed 228. Yesterday evening the patient felt poorly, developed a fever and began to vomit. She was unable to eat lunch or supper yesterday and has had no breakfast or medications today. Today she has become confused (thinking her husband was still alive and living with her) and she has begun to cough up orange phlegm.   In the ER the patient has a temp of 102.2, pulse rate of 123, initial BP of 81/58. Her BP has responded to IVF and is currently SBP = 106. WBC is 19.8. Baseline creatinine is 1.08. Today (9/23) creatine is 1.77. CXR shows chronic pulm edema and a small pleural effusion.  HPI/Subjective: 9/25 overnight MAXIMUM TEMPERATURE= 38.9, A/O 4, negative CP, negative increased SOB (on 3 L O2 at home chronically), negative N/V. States overnight serious sweating which soaked through her nightgown.   Assessment/Plan: Multisystem organ failure -See below  Severe sepsis with organ dysfunction - Pyelonephritis (Escherichia coli) -Acute renal failure with likely pyelonephritis. But BP responding to hydration  -Lactic acidosis resolved -DC Zosyn and vancomycin start ceftriaxone  Acute renal failure with likely pyelonephritis(Baseline Cr~1.08) - Trending down now 1.37   Chronic diastolic heart  failure/ Pulmonary HTN/cardiomyopathy/Tricuspid valve regurgitation -Dry weight approximately 232lbs (105.2 kg).  -Recent echo on 08/09/15 showed LVEF of 55-60, with severely dilated right atrium and pulm artery filling pressure of 93 mm hg. -Daily weights; 9/25 weight= 106.7 kg -Strict I's and O's since admission,+ 2.4 L -CHF Team following   Atrial fibrillation with RVR -RVR likely due to infection plus patient's inability to take medications. Still not well controlled but improved. -Per CHF team DC Amiodarone drip, and one dose of Lasix 40 mg  -Patient's HR still not well-controlled increase metoprolol 5 mg QID -Continue Coumadin per pharmacy. INR therapeutic.  Hypokalemia  -Potassium goal> 4 -Potassium IV 50 mEq  Hypomagnesemia -Magnesium goal> 2 -Magnesium IV 2 gm       Code Status: FULL Family Communication: family present at time of exam Disposition Plan: Home per family; 43 hours?    Consultants: Dr.Jonathan F Harl Bowie (cardiology)    Procedure/Significant Events: 9/1 echocardiogram;- Left ventricle: severe concentric hypertrophy.-LVEF=  55% to 60%. - Right ventricle: moderately dilated - Right atrium:  severely dilated. - Tricuspid valve: moderate regurgitation. - Pulmonary arteries:  PApeak pressure: 93 mm Hg (S).    Culture 9/23 blood Rt/Lt hand NGTD 9/23 urine positive Escherichia coli 9/23 MRSA by PCR negative   Antibiotics: Tressie Ellis 9/231 dose Zosyn 9/23>> stopped 9/25 Vancomycin 9/23>> stopped 9/25 Ceftriaxone>> 9/23  DVT prophylaxis: Heparin drip   Devices    LINES / TUBES:      Continuous Infusions: . heparin 1,350 Units/hr (09/01/15 2112)    Objective: VITAL SIGNS: Temp: 97.1 F (36.2 C) (09/25 0444) Temp Source: Axillary (09/25 0444) BP: 117/52 mmHg (09/25 0444) Pulse Rate:  94 (09/25 0444) SPO2; FIO2:   Intake/Output Summary (Last 24 hours) at 09/02/15 1118 Last data filed at 09/01/15 2320  Gross per 24 hour    Intake    344 ml  Output      0 ml  Net    344 ml     Exam: General: A/O 4, chronic respiratory distress, No acute respiratory distress, frail elderly patient Eyes: Negative headache, negative scleral hemorrhage ENT: Negative Runny nose, negative gingival bleeding, Neck:  Negative scars, masses, torticollis, lymphadenopathy, JVD Lungs: Clear to auscultation bilaterally without wheezes or crackles Cardiovascular: Irregular irregular rhythm and rate without murmur gallop or rub normal S1 and S2 Abdomen:negative abdominal pain, nondistended, positive soft, bowel sounds, no rebound, no ascites, no appreciable mass, negative CVA tenderness Extremities: No significant cyanosis, clubbing, or edema bilateral lower extremities Psychiatric:  Negative depression, negative anxiety, negative fatigue, negative mania  Neurologic:  Cranial nerves II through XII intact, tongue/uvula midline, all extremities muscle strength 5/5, sensation intact throughout, negative dysarthria, negative expressive aphasia, negative receptive aphasia.   Data Reviewed: Basic Metabolic Panel:  Recent Labs Lab 08/28/15 1153 08/31/15 0952 08/31/15 2344 09/01/15 0416 09/02/15 0333  NA 136 137 135 135 133*  K 3.7 4.0 3.9 3.6 3.1*  CL 90* 91* 92* 93* 91*  CO2 39* 37* 30 33* 33*  GLUCOSE 195* 211* 166* 161* 92  BUN 23* 28* 23* 24* 22*  CREATININE 1.29* 1.77* 1.42* 1.40* 1.37*  CALCIUM 8.7* 8.7* 8.3* 8.2* 8.0*  MG  --   --   --   --  1.7   Liver Function Tests:  Recent Labs Lab 08/31/15 0952 09/01/15 0416 09/02/15 0333  AST 22 20 22   ALT 8* 7* 8*  ALKPHOS 76 75 70  BILITOT 1.1 1.2 1.5*  PROT 7.6 6.8 6.9  ALBUMIN 3.2* 2.7* 2.5*   No results for input(s): LIPASE, AMYLASE in the last 168 hours. No results for input(s): AMMONIA in the last 168 hours. CBC:  Recent Labs Lab 08/31/15 0952 08/31/15 2344 09/01/15 0416 09/02/15 0333  WBC 19.8* 20.4* 18.3* 11.8*  NEUTROABS 16.4*  --   --  8.7*  HGB 9.6*  9.5* 9.4* 9.1*  HCT 31.1* 29.8* 29.8* 29.0*  MCV 99.7 99.0 98.0 98.0  PLT 146* 117* 112* 112*   Cardiac Enzymes: No results for input(s): CKTOTAL, CKMB, CKMBINDEX, TROPONINI in the last 168 hours. BNP (last 3 results)  Recent Labs  08/03/15 1314 08/09/15 1155 08/31/15 2035  BNP 570.3* 561.8* 874.5*    ProBNP (last 3 results)  Recent Labs  06/08/15 1619  PROBNP 370.0*    CBG:  Recent Labs Lab 08/31/15 2033 09/01/15 0825 09/01/15 1215 09/01/15 1627 09/01/15 2146  GLUCAP 170* 120* 205* 82 91    Recent Results (from the past 240 hour(s))  Culture, blood (routine x 2)     Status: None (Preliminary result)   Collection Time: 08/31/15  9:40 AM  Result Value Ref Range Status   Specimen Description BLOOD RIGHT HAND  Final   Special Requests BOTTLES DRAWN AEROBIC AND ANAEROBIC 5CC  Final   Culture NO GROWTH 1 DAY  Final   Report Status PENDING  Incomplete  Culture, blood (routine x 2)     Status: None (Preliminary result)   Collection Time: 08/31/15  9:46 AM  Result Value Ref Range Status   Specimen Description BLOOD LEFT HAND  Final   Special Requests BOTTLES DRAWN AEROBIC AND ANAEROBIC 5CC  Final   Culture NO GROWTH 1  DAY  Final   Report Status PENDING  Incomplete  Urine culture     Status: None   Collection Time: 08/31/15 10:35 AM  Result Value Ref Range Status   Specimen Description URINE, CATHETERIZED  Final   Special Requests NONE  Final   Culture >=100,000 COLONIES/mL ESCHERICHIA COLI  Final   Report Status 09/02/2015 FINAL  Final   Organism ID, Bacteria ESCHERICHIA COLI  Final      Susceptibility   Escherichia coli - MIC*    AMPICILLIN >=32 RESISTANT Resistant     CEFAZOLIN <=4 SENSITIVE Sensitive     CEFTRIAXONE <=1 SENSITIVE Sensitive     CIPROFLOXACIN <=0.25 SENSITIVE Sensitive     GENTAMICIN <=1 SENSITIVE Sensitive     IMIPENEM <=0.25 SENSITIVE Sensitive     NITROFURANTOIN <=16 SENSITIVE Sensitive     TRIMETH/SULFA >=320 RESISTANT Resistant      AMPICILLIN/SULBACTAM 16 INTERMEDIATE Intermediate     PIP/TAZO <=4 SENSITIVE Sensitive     * >=100,000 COLONIES/mL ESCHERICHIA COLI  MRSA PCR Screening     Status: None   Collection Time: 08/31/15  4:49 PM  Result Value Ref Range Status   MRSA by PCR NEGATIVE NEGATIVE Final    Comment:        The GeneXpert MRSA Assay (FDA approved for NASAL specimens only), is one component of a comprehensive MRSA colonization surveillance program. It is not intended to diagnose MRSA infection nor to guide or monitor treatment for MRSA infections.      Studies:  Recent x-ray studies have been reviewed in detail by the Attending Physician  Scheduled Meds:  Scheduled Meds: . antiseptic oral rinse  7 mL Mouth Rinse BID  . atorvastatin  20 mg Oral Daily  . docusate sodium  100 mg Oral BID  . furosemide  40 mg Intravenous Once  . insulin aspart  0-15 Units Subcutaneous TID WC  . insulin glargine  5 Units Subcutaneous QHS  . magnesium sulfate 1 - 4 g bolus IVPB  2 g Intravenous Once  . metoprolol  5 mg Intravenous Q6H  . pantoprazole  40 mg Oral BID AC  . potassium chloride  10 mEq Intravenous Q1 Hr x 5  . sodium chloride  3 mL Intravenous Q12H  . sodium chloride  3 mL Intravenous Q12H  . Warfarin - Pharmacist Dosing Inpatient   Does not apply q1800    Time spent on care of this patient: 40 mins   WOODS, Geraldo Docker , MD  [facility] Office  [phone] Pager - [phone]  On-Call/Text Page:      [email or web address]      password [email or web address]  If 7PM-7AM, please contact night-coverage [email or web address] Password [email or web address] 09/02/2015, 11:18 AM   LOS: 2 days   Care during the described time interval was provided by me .  I have reviewed this patient's available data, including medical history, events of note, physical examination, and all test results as part of my evaluation. I have personally reviewed and interpreted all radiology studies.   Dia Crawford, MD [phone] Pager

## 2015-09-03 ENCOUNTER — Ambulatory Visit: Payer: Self-pay | Admitting: *Deleted

## 2015-09-03 DIAGNOSIS — I5033 Acute on chronic diastolic (congestive) heart failure: Secondary | ICD-10-CM

## 2015-09-03 DIAGNOSIS — I482 Chronic atrial fibrillation: Secondary | ICD-10-CM

## 2015-09-03 DIAGNOSIS — I071 Rheumatic tricuspid insufficiency: Secondary | ICD-10-CM

## 2015-09-03 DIAGNOSIS — I509 Heart failure, unspecified: Secondary | ICD-10-CM | POA: Insufficient documentation

## 2015-09-03 LAB — COMPREHENSIVE METABOLIC PANEL
ALBUMIN: 2.3 g/dL — AB (ref 3.5–5.0)
ALT: 11 U/L — ABNORMAL LOW (ref 14–54)
ANION GAP: 6 (ref 5–15)
AST: 37 U/L (ref 15–41)
Alkaline Phosphatase: 67 U/L (ref 38–126)
BILIRUBIN TOTAL: 2 mg/dL — AB (ref 0.3–1.2)
BUN: 24 mg/dL — ABNORMAL HIGH (ref 6–20)
CO2: 32 mmol/L (ref 22–32)
Calcium: 7.8 mg/dL — ABNORMAL LOW (ref 8.9–10.3)
Chloride: 94 mmol/L — ABNORMAL LOW (ref 101–111)
Creatinine, Ser: 1.33 mg/dL — ABNORMAL HIGH (ref 0.44–1.00)
GFR calc Af Amer: 41 mL/min — ABNORMAL LOW (ref >60)
GFR calc non Af Amer: 35 mL/min — ABNORMAL LOW (ref >60)
GLUCOSE: 97 mg/dL (ref 65–99)
POTASSIUM: 4.7 mmol/L (ref 3.5–5.1)
SODIUM: 132 mmol/L — AB (ref 135–145)
Total Protein: 5.9 g/dL — ABNORMAL LOW (ref 6.5–8.1)

## 2015-09-03 LAB — GLUCOSE, CAPILLARY
GLUCOSE-CAPILLARY: 93 mg/dL (ref 65–99)
GLUCOSE-CAPILLARY: 102 mg/dL — AB (ref 65–99)
GLUCOSE-CAPILLARY: 130 mg/dL — AB (ref 65–99)
Glucose-Capillary: 124 mg/dL — ABNORMAL HIGH (ref 65–99)

## 2015-09-03 LAB — PROTIME-INR
INR: 2.71 — ABNORMAL HIGH (ref 0.00–1.49)
PROTHROMBIN TIME: 28.3 s — AB (ref 11.6–15.2)

## 2015-09-03 LAB — MAGNESIUM: Magnesium: 2.3 mg/dL (ref 1.7–2.4)

## 2015-09-03 MED ORDER — FUROSEMIDE 10 MG/ML IJ SOLN
80.0000 mg | Freq: Once | INTRAMUSCULAR | Status: AC
  Administered 2015-09-03: 80 mg via INTRAVENOUS
  Filled 2015-09-03: qty 8

## 2015-09-03 MED ORDER — METOPROLOL TARTRATE 25 MG PO TABS
25.0000 mg | ORAL_TABLET | Freq: Four times a day (QID) | ORAL | Status: DC
  Administered 2015-09-03 – 2015-09-05 (×7): 25 mg via ORAL
  Filled 2015-09-03 (×7): qty 1

## 2015-09-03 MED ORDER — WARFARIN SODIUM 2 MG PO TABS
2.0000 mg | ORAL_TABLET | Freq: Once | ORAL | Status: DC
  Filled 2015-09-03: qty 1

## 2015-09-03 MED ORDER — WARFARIN SODIUM 3 MG PO TABS
1.5000 mg | ORAL_TABLET | Freq: Once | ORAL | Status: AC
  Administered 2015-09-03: 1.5 mg via ORAL
  Filled 2015-09-03: qty 0.5
  Filled 2015-09-03: qty 1

## 2015-09-03 NOTE — Progress Notes (Signed)
ANTICOAGULATION CONSULT NOTE - Follow Up Consult  Pharmacy Consult:  Coumadin Indication: atrial fibrillation  Allergies  Allergen Reactions  . Peach [Prunus Persica] Rash    AGENT: peach fuzz    Patient Measurements: Height: 5\' 2"  (157.5 cm) Weight: 236 lb 8.9 oz (107.3 kg) IBW/kg (Calculated) : 50.1  Vital Signs: Temp: 97.4 F (36.3 C) (09/26 0720) Temp Source: Oral (09/26 0720) BP: 112/67 mmHg (09/26 1000) Pulse Rate: 89 (09/26 1000)  Labs:  Recent Labs  08/31/15 2035  08/31/15 2344  09/01/15 0416 09/01/15 1240 09/01/15 1844 09/02/15 0333 09/03/15 0226  HGB  --   < > 9.5*  --  9.4*  --   --  9.1*  --   HCT  --   --  29.8*  --  29.8*  --   --  29.0*  --   PLT  --   --  117*  --  112*  --   --  112*  --   APTT 35  --   --   --   --   --   --   --   --   LABPROT 20.1*  --   --   --  22.1*  --   --  24.7* 28.3*  INR 1.71*  --   --   --  1.95*  --   --  2.26* 2.71*  HEPARINUNFRC  --   --   --   < > 0.15* 0.28* 0.28* 0.11*  --   CREATININE  --   < > 1.42*  --  1.40*  --   --  1.37* 1.33*  < > = values in this interval not displayed.  Estimated Creatinine Clearance: 35 mL/min (by C-G formula based on Cr of 1.33).      Assessment: 85 YOF with history of Afib to continue on Coumadin.  INR therapeutic and no bleeding reported.  Patient's platelet count has been trending down in the past several months.   Goal of Therapy:  INR 2-3    Plan:  - Coumadin 1.5mg  PO today - Daily PT / INR - F/U resume home gabapentin    Thuy D. Mina Marble, PharmD, BCPS Pager:  [phone] 09/03/2015, 11:15 AM

## 2015-09-03 NOTE — Evaluation (Signed)
Occupational Therapy Evaluation Patient Details Name: [name] MRN: [id number] DOB: 11/09/1926 Today's Date: 09/03/2015    History of Present Illness Patient is an 81 yo female admitted 08/31/15 with severe sepsis, confusion, Afib with RVR, CHF.    PMH:  HTN, CHF, DM, neuropathy, Afib    Clinical Impression   This 81 yo female admitted with above presents to acute OT with decreased balance/mobility, decreased use of LUE, obesity all affecting her ability to help care for herself. She will benefit from acute OT with follow up at SNF.    Follow Up Recommendations  SNF;Supervision/Assistance - 24 hour;Other (comment) (if continues to progress well then [facility] would be appropriate)          Precautions / Restrictions Precautions Precautions: Fall Restrictions Weight Bearing Restrictions: No      Mobility Bed Mobility Overal bed mobility: Needs Assistance Bed Mobility: Supine to Sit     Supine to sit: Mod assist;HOB elevated     General bed mobility comments: had pt get out on right hand side of bed since this is the side she gets out on at home, cannot reach with LUE for rail due to injury to left shoulder (05/19/15).  Transfers Overall transfer level: Needs assistance Equipment used: Rolling walker (2 wheeled) Transfers: Sit to/from [facility] Sit to Stand: Min assist Stand pivot transfers: Min assist            Balance Overall balance assessment: Needs assistance Sitting-balance support: No upper extremity supported;Feet supported Sitting balance-Leahy Scale: Fair     Standing balance support: Bilateral upper extremity supported Standing balance-Leahy Scale: Poor                              ADL Overall ADL's : Needs assistance/impaired Eating/Feeding: Set up;Sitting   Grooming: Set up;Sitting   Upper Body Bathing: Moderate assistance;Sitting   Lower Body Bathing: Maximal assistance (with min A sit<>stand)   Upper  Body Dressing : Maximal assistance;Sitting   Lower Body Dressing: Total assistance (with min A sit<>stand)   Toilet Transfer: Minimal assistance;Stand-pivot;RW;BSC   Toileting- Clothing Manipulation and Hygiene: Moderate assistance (with min A sit<>stand)               Vision Additional Comments: wears glasses for near and far          Pertinent Vitals/Pain Pain Assessment: No/denies pain     Hand Dominance Right   Extremity/Trunk Assessment Upper Extremity Assessment Upper Extremity Assessment: LUE deficits/detail RUE Deficits / Details: Grossly 3/5 LUE Deficits / Details: Unable to raise shoulder on her own and minimal movement of elbow on her own; fell on 05/19/15 with resultant anterior/inferior dislocation with closed reduction; rest of arm WNL grossly 3/5 LUE Coordination: decreased gross motor           Communication Communication Communication: No difficulties   Cognition Arousal/Alertness: Awake/alert Behavior During Therapy: WFL for tasks assessed/performed Overall Cognitive Status: Within Functional Limits for tasks assessed                                Home Living Family/patient expects to be discharged to:: Private residence Living Arrangements: Children (daughter and grand-daughter) Available Help at Discharge: Family;Available 24 hours/day Type of Home: House Home Access: Ramped entrance     Home Layout: Two level;Bed/bath upstairs Alternate Level Stairs-Number of Steps: 6--landing-6 Alternate Level Stairs-Rails: Left Bathroom  Shower/Tub: Tub/shower unit;Curtain Shower/tub characteristics: Architectural technologist: Standard     Home Equipment: Environmental consultant - 2 wheels;Walker - 4 wheels;Wheelchair - [employer];Shower seat          Prior Functioning/Environment Level of Independence: Needs assistance  Gait / Transfers Assistance Needed: Ambulating short distances with RW and assist since June.  Using w/c.  Has been  walking up stairs with 2 person assist ADL's / Homemaking Assistance Needed: Daughter and grand daughter assists with bathing/dressing (per pt she does most of her dressing).  Daughter does meal prep and housekeeping        OT Diagnosis: Generalized weakness (decreased use of LUE)   OT Problem List: Decreased strength;Decreased range of motion;Impaired UE functional use;Obesity;Impaired balance (sitting and/or standing)   OT Treatment/Interventions: Self-care/ADL training;DME and/or AE instruction;Therapeutic activities;Patient/family education;Balance training    OT Goals(Current goals can be found in he care plan section) Acute Rehab OT Goals Patient Stated Goal: to go home OT Goal Formulation: With patient Time For Goal Achievement: 09/10/15 Potential to Achieve Goals: Good  OT Frequency: Min 2X/week              End of Session Equipment Utilized During Treatment: Rolling walker;Gait belt Nurse Communication:  (NT A'd me with gettting her up)  Activity Tolerance: Patient tolerated treatment well Patient left: in chair;with call bell/phone within reach   Time: 1030-1059 OT Time Calculation (min): 29 min Charges:  OT General Charges $OT Visit: 1 Procedure OT Evaluation $Initial OT Evaluation Tier I: 1 Procedure OT Treatments $Self Care/Home Management : 8-22 mins  Almon Register [phone] 09/03/2015, 11:17 AM

## 2015-09-03 NOTE — Progress Notes (Signed)
Patient: [name] / Admit Date: 08/31/2015 / Date of Encounter: 09/03/2015, 1:47 PM   Subjective: Feeling better with a little more energy. No SOB. Doesn't quite feel up to eating full meals yet but is now taking some pills orally.   Objective: Telemetry: atrial fib, occ PVC HR hovering 95-110 Physical Exam: Blood pressure 104/73, pulse 94, temperature 98.2 F (36.8 C), temperature source Oral, resp. rate 25, height 5\' 2"  (1.575 m), weight 236 lb 8.9 oz (107.3 kg), SpO2 99 %. General: Well developed obese AAF in no acute distress. Head: Normocephalic, atraumatic, sclera non-icteric, no xanthomas, nares are without discharge. Neck: Negative for carotid bruits. JVP not elevated. Lungs: Slight crackles R lung base, otherwise clear bilaterally to auscultation without wheezes, rales, or rhonchi. Breathing is unlabored. Heart: Irregularly irregular S1 S2 without murmurs, rubs, or gallops.  Abdomen: Soft, non-tender, non-distended with normoactive bowel sounds. No rebound/guarding. Extremities: No clubbing or cyanosis. Trace-1+ BLE edema- difficult to assess given large leg habitus but she has chronic skin thickening/striations which give the appearance that edema was worse at one time. Neuro: Alert and oriented X 3. Moves all extremities spontaneously. Psych:  Responds to questions appropriately with a normal affect.   Intake/Output Summary (Last 24 hours) at 09/03/15 1347 Last data filed at 09/03/15 0843  Gross per 24 hour  Intake  860.3 ml  Output      0 ml  Net  860.3 ml    Inpatient Medications:  . antiseptic oral rinse  7 mL Mouth Rinse BID  . atorvastatin  20 mg Oral Daily  . cefTRIAXone (ROCEPHIN)  IV  2 g Intravenous Q24H  . docusate sodium  100 mg Oral BID  . furosemide  80 mg Intravenous Once  . insulin aspart  0-15 Units Subcutaneous TID WC  . metoprolol tartrate  25 mg Oral 4 times per day  . pantoprazole  40 mg Oral BID AC  . warfarin  1.5 mg Oral ONCE-1800  .  Warfarin - Pharmacist Dosing Inpatient   Does not apply q1800   Infusions:    Labs:  Recent Labs  09/02/15 0333 09/03/15 0226  NA 133* 132*  K 3.1* 4.7  CL 91* 94*  CO2 33* 32  GLUCOSE 92 97  BUN 22* 24*  CREATININE 1.37* 1.33*  CALCIUM 8.0* 7.8*  MG 1.7 2.3    Recent Labs  09/02/15 0333 09/03/15 0226  AST 22 37  ALT 8* 11*  ALKPHOS 70 67  BILITOT 1.5* 2.0*  PROT 6.9 5.9*  ALBUMIN 2.5* 2.3*    Recent Labs  09/01/15 0416 09/02/15 0333  WBC 18.3* 11.8*  NEUTROABS  --  8.7*  HGB 9.4* 9.1*  HCT 29.8* 29.0*  MCV 98.0 98.0  PLT 112* 112*   No results for input(s): CKTOTAL, CKMB, TROPONINI in the last 72 hours. Invalid input(s): POCBNP No results for input(s): HGBA1C in the last 72 hours.   Radiology/Studies:  Dg Chest 2 View  09/01/2015   CLINICAL DATA:  CHF.  EXAM: CHEST  2 VIEW  COMPARISON:  08/31/2015  FINDINGS: Cardiomegaly with vascular congestion. Diffuse interstitial prominence throughout the lungs again noted, stable. This could reflect chronic interstitial lung disease or edema. Favor chronic interstitial disease. No effusions. No acute bony abnormality.  IMPRESSION: Cardiomegaly with vascular congestion.  Stable diffuse interstitial prominence which could reflect chronic interstitial lung disease or edema.   Electronically Signed   By: Rolm Baptise M.D.   On: 09/01/2015 09:24   Dg Chest  2 View  08/31/2015   CLINICAL DATA:  Fever today.  The patient feels ill.  EXAM: CHEST  2 VIEW  COMPARISON:  PA and lateral chest 06/08/2015, 10/31/2013 and 08/28/2012.  FINDINGS: There is cardiomegaly and pulmonary vascular congestion. No consolidative process or pneumothorax is identified. There may be a very small left pleural effusion. Aortic atherosclerosis is noted.  IMPRESSION: Very small left pleural effusion.  No evidence of pneumonia.  Cardiomegaly and chronic pulmonary vascular congestion.   Electronically Signed   By: Inge Rise M.D.   On: 08/31/2015 10:06      Assessment and Plan  26F with chronic atrial fibrillation on Coumadin, HTN, chronic diastolic HF, DM, GIB, DCIS, mitral regurgitation (unclear how significant - trivial per 08/2015 echo but 3+ by 05/2015 cath), OHS on home O2, morbid obesity, HLD, minimal CAD by cath 05/2015. Admitted with vomiting, confusion (acute toxic metabolic encephalopathy), fever/leukocytosis and found to have severe sepsis with multiorgan failure due to E. Coli pyelonephritis. Hospital course notable for AKI 2/2 pre-renal azotemia and ATN, hypokalemia, hypomagnesemia, and atrial fib RVR.  1. Chronic atrial fib with RVR - elevated rates likely driven by underlying illness, management initially complicated by sepsis and low BP (had to be on IV amiodarone)  - on IV metoprolol 5mg  q6hr now with HR 95-110  - see below - Coumadin per pharmacy  2. Chronic diastolic CHF - 04/3613 echo LVEF 55-60%, no WMAs, high LA pressure, mod RV dysfunction, PASP 93 - UOP not documented, strict I/Os ordered (?incontinence) - weight up slightly from baseline (232 in CHF office when feeling well) - will discuss further diuresis with MD. On torsemide 40mg  QAM/20mg  QPM at home. S/p 40mg  IV Lasix yesterday with no sig change in wt. Cr generally stable compared to yesterday at 1.33. (Baseline appears 1.1-1.2). Also suspect some component of volume retention 2/2 hypoalbuminemia. Given softer BP will review plan for diuretic before changing IV metoprolol to oral form.  3. Pulmonary HTN - RHC 08/2015, mean PA 45 with wedge of 33. Pulmonary HTN due to left sided heart disease - continue management of diastolic HF  4. Sepsis due to UTI  - per primary team.   Signed, Melina Copa PA-C Pager: [phone]  I have seen, examined and evaluated the patient this PM  along with Mrs. Dunn, PA.  After reviewing all the available data and chart,  I agree with her findings, examination as well as impression recommendations.  Seems more stable from Sepsis  standpoint.   HR much improved, but remains elevated -- will gradually increase BB - & change to PO. Mild rales on exam & weight is up -- will need more diuresis; no response (although not recorded) -- will give IV Lasix today & restart home Torsemide in AM   [facility], Leonie Green, M.D., M.S. Interventional Cardiologist   Pager # [phone]

## 2015-09-03 NOTE — Consult Note (Signed)
   [facility] CM Inpatient Consult   09/03/2015  CYNTHYA YAM 1926/03/27 [id number] Patient is currently active [up to [place] with [facility] for chronic disease management services.  Patient has been engaged by a [employer].  Our community based plan of care has focused on disease management and community resource support.  Met with the patient and her daughter, [name],  at the bedside.  Daughter endorses that Dr. Glendale Chard is the patient's primary care provider. She also states that the patient was active with [facility]. Daughter states they plan to return home for the disposition.  She endorses that they may need more resources and plan to apply for Medicaid.  Will update community care Freight forwarder, as well.  Spoke with inpatient care manager, [place] following.   Patient will receive a post discharge transition of care call and will be evaluated for monthly home visits for assessments and disease process education.  Made Inpatient Case Manager aware that [facility] following. Of note, [facility] Care Management services does not replace or interfere with any services that are arranged by inpatient case management or social work.  For additional questions or referrals please contact: Natividad Brood, RN BSN [facility] Liaison  [phone] business mobile phone

## 2015-09-03 NOTE — Progress Notes (Addendum)
[facility] TEAM 1 - Stepdown/ICU TEAM PROGRESS NOTE  [name] EKC:[id number] DOB: 08/18/1926 DOA: 08/31/2015 PCP: Maximino Greenland, MD  Admit HPI / Brief Narrative: 81 y.o. female, with hx HTN, D-HF, diabetes mellitus, GI bleed, DCIS who presented to the ER with vomiting and confusion.  The patient felt poorly, developed a fever, and began to vomit. She was unable to eat meals. When she also became confused she was brought to the ED.   In the ER the patient had a temp of 102.2, pulse rate of 123, BP of 81/58. WBC is 19.8. Baseline creatinine 1.08 > admit (9/23) creatine 1.77. CXR showed chronic pulm edema and a small pleural effusion.  HPI/Subjective: Patient is awake and conversant.  She denies chest pain shortness breath fevers chills nausea or vomiting.  Assessment/Plan:  Severe sepsis with Multisystem organ failure - Pyelonephritis (Escherichia coli) -Lactic acidosis resolved -Zosyn and vancomycin > ceftriaxone -Clinically much improved with resolution of septic hemodynamics  Acute renal failure due to prerenal azotemia +/- ATN -baseline Cr ~ 1.08 - crt slowly improving   Chronic diastolic heart failure / Pulmonary HTN / Cardiomyopathy / Tricuspid valve regurgitation -Dry weight approximately 105.2 kg - currently 107.3kg - net + 4.4L thus far  -CHF Team following   Atrial fibrillation with RVR -RVR likely due to infection plus patient's inability to take medications -Care per Cardiology - now off IV amio  -Continue Coumadin per pharmacy. INR therapeutic.  Hypokalemia  -resolved w/ correction   Hypomagnesemia -resolved w/ correction   DM  CBG well controlled - A1c 7.1 - does not appear to be on meds at home   Code Status: FULL Family Communication: no family present at time of exam Disposition Plan: transfer to CHF floor - PT/OT - cont gently diuresis - follow rate - cont abx   Consultants: [facility] Cardiology Palliative Care     Procedures: none  Antibiotics: Fortaz 9/23 Zosyn 9/23 > 9/25 Vancomycin 9/23 > 9/25 Ceftriaxone 9/23 >   DVT prophylaxis: Warfarin   Objective: Blood pressure 98/51, pulse 101, temperature 97.4 F (36.3 C), temperature source Oral, resp. rate 22, height 5\' 2"  (1.575 m), weight 107.3 kg (236 lb 8.9 oz), SpO2 100 %.  Intake/Output Summary (Last 24 hours) at 09/03/15 1020 Last data filed at 09/03/15 0843  Gross per 24 hour  Intake 1666.25 ml  Output      0 ml  Net 1666.25 ml    Exam: General: No acute respiratory distress - alert  Lungs: Clear to auscultation bilaterally without wheezes or crackles Cardiovascular: Regular rate and rhythm without gallop or rub normal S1 and S2 Abdomen: Nontender, nondistended, soft, bowel sounds positive, no rebound, no ascites, no appreciable mass Extremities: No significant cyanosis, or clubbing;  1+ edema bilateral lower extremities  Data Reviewed: Basic Metabolic Panel:  Recent Labs Lab 08/31/15 0952 08/31/15 2344 09/01/15 0416 09/02/15 0333 09/03/15 0226  NA 137 135 135 133* 132*  K 4.0 3.9 3.6 3.1* 4.7  CL 91* 92* 93* 91* 94*  CO2 37* 30 33* 33* 32  GLUCOSE 211* 166* 161* 92 97  BUN 28* 23* 24* 22* 24*  CREATININE 1.77* 1.42* 1.40* 1.37* 1.33*  CALCIUM 8.7* 8.3* 8.2* 8.0* 7.8*  MG  --   --   --  1.7 2.3    CBC:  Recent Labs Lab 08/31/15 0952 08/31/15 2344 09/01/15 0416 09/02/15 0333  WBC 19.8* 20.4* 18.3* 11.8*  NEUTROABS 16.4*  --   --  8.7*  HGB  9.6* 9.5* 9.4* 9.1*  HCT 31.1* 29.8* 29.8* 29.0*  MCV 99.7 99.0 98.0 98.0  PLT 146* 117* 112* 112*    Liver Function Tests:  Recent Labs Lab 08/31/15 0952 09/01/15 0416 09/02/15 0333 09/03/15 0226  AST 22 20 22  37  ALT 8* 7* 8* 11*  ALKPHOS 76 75 70 67  BILITOT 1.1 1.2 1.5* 2.0*  PROT 7.6 6.8 6.9 5.9*  ALBUMIN 3.2* 2.7* 2.5* 2.3*   Coags:  Recent Labs Lab 08/31/15 0953 08/31/15 2035 09/01/15 0416 09/02/15 0333 09/03/15 0226  INR 1.59* 1.71*  1.95* 2.26* 2.71*    Recent Labs Lab 08/31/15 2035  APTT 35   CBG:  Recent Labs Lab 09/02/15 0822 09/02/15 1254 09/02/15 1644 09/02/15 2203 09/03/15 0724  GLUCAP 67 97 127* 97 93    Recent Results (from the past 240 hour(s))  Culture, blood (routine x 2)     Status: None (Preliminary result)   Collection Time: 08/31/15  9:40 AM  Result Value Ref Range Status   Specimen Description BLOOD RIGHT HAND  Final   Special Requests BOTTLES DRAWN AEROBIC AND ANAEROBIC 5CC  Final   Culture NO GROWTH 2 DAYS  Final   Report Status PENDING  Incomplete  Culture, blood (routine x 2)     Status: None (Preliminary result)   Collection Time: 08/31/15  9:46 AM  Result Value Ref Range Status   Specimen Description BLOOD LEFT HAND  Final   Special Requests BOTTLES DRAWN AEROBIC AND ANAEROBIC 5CC  Final   Culture NO GROWTH 2 DAYS  Final   Report Status PENDING  Incomplete  Urine culture     Status: None   Collection Time: 08/31/15 10:35 AM  Result Value Ref Range Status   Specimen Description URINE, CATHETERIZED  Final   Special Requests NONE  Final   Culture >=100,000 COLONIES/mL ESCHERICHIA COLI  Final   Report Status 09/02/2015 FINAL  Final   Organism ID, Bacteria ESCHERICHIA COLI  Final      Susceptibility   Escherichia coli - MIC*    AMPICILLIN >=32 RESISTANT Resistant     CEFAZOLIN <=4 SENSITIVE Sensitive     CEFTRIAXONE <=1 SENSITIVE Sensitive     CIPROFLOXACIN <=0.25 SENSITIVE Sensitive     GENTAMICIN <=1 SENSITIVE Sensitive     IMIPENEM <=0.25 SENSITIVE Sensitive     NITROFURANTOIN <=16 SENSITIVE Sensitive     TRIMETH/SULFA >=320 RESISTANT Resistant     AMPICILLIN/SULBACTAM 16 INTERMEDIATE Intermediate     PIP/TAZO <=4 SENSITIVE Sensitive     * >=100,000 COLONIES/mL ESCHERICHIA COLI  MRSA PCR Screening     Status: None   Collection Time: 08/31/15  4:49 PM  Result Value Ref Range Status   MRSA by PCR NEGATIVE NEGATIVE Final    Comment:        The GeneXpert MRSA Assay  (FDA approved for NASAL specimens only), is one component of a comprehensive MRSA colonization surveillance program. It is not intended to diagnose MRSA infection nor to guide or monitor treatment for MRSA infections.      Studies:   Recent x-ray studies have been reviewed in detail by the Attending Physician  Scheduled Meds:  Scheduled Meds: . antiseptic oral rinse  7 mL Mouth Rinse BID  . atorvastatin  20 mg Oral Daily  . cefTRIAXone (ROCEPHIN)  IV  2 g Intravenous Q24H  . docusate sodium  100 mg Oral BID  . insulin aspart  0-15 Units Subcutaneous TID WC  . insulin glargine  5  Units Subcutaneous QHS  . metoprolol  5 mg Intravenous Q6H  . pantoprazole  40 mg Oral BID AC  . sodium chloride  3 mL Intravenous Q12H  . sodium chloride  3 mL Intravenous Q12H  . Warfarin - Pharmacist Dosing Inpatient   Does not apply q1800    Time spent on care of this patient: 35 mins   [facility] T , MD   [facility] Office  [phone] Pager - Text Page per Amion as per below:  On-Call/Text Page:      [email or web address]      password [email or web address]  If 7PM-7AM, please contact night-coverage [email or web address] Password [email or web address] 09/03/2015, 10:20 AM   LOS: 3 days

## 2015-09-03 NOTE — Care Management Important Message (Signed)
Important Message  Patient Details  Name: [name] MRN: [id number] Date of Birth: 1926-09-23   Medicare Important Message Given:  Yes-second notification given    Delorse Lek 09/03/2015, 1:52 PM

## 2015-09-03 NOTE — Clinical Documentation Improvement (Signed)
  Hospitalist  Can the diagnosis of acute renal failure be further specified?  Acute Tubular Necrosis  Acute Renal Cortical Necrosis  Acute Renal Medullary Necrosis  Acute on Chronic Renal Failure  Chronic Renal Failure  Other  Clinically Undetermined  Document any associated diagnoses/conditions.  Supporting Information: 09/01/15 progr note.Marland KitchenMarland Kitchen"Acute renal failure with likely pyelonephritis"..."Likely simple prerenal v/s ATN - follow w/ hydration."... 09/01/15 Cards progr note..."sepsis and low bp's.".Marland KitchenMarland Kitchen  Please exercise your independent, professional judgment when responding. A specific answer is not anticipated or expected.  Thank You,  Ermelinda Das, RN, BSN, CCDS Certified Clinical Documentation Specialist Pager: [place]: Health Information Management

## 2015-09-04 ENCOUNTER — Encounter (HOSPITAL_COMMUNITY): Payer: Commercial Managed Care - HMO

## 2015-09-04 DIAGNOSIS — I429 Cardiomyopathy, unspecified: Secondary | ICD-10-CM

## 2015-09-04 DIAGNOSIS — Z7901 Long term (current) use of anticoagulants: Secondary | ICD-10-CM

## 2015-09-04 DIAGNOSIS — N390 Urinary tract infection, site not specified: Secondary | ICD-10-CM | POA: Insufficient documentation

## 2015-09-04 DIAGNOSIS — I5031 Acute diastolic (congestive) heart failure: Secondary | ICD-10-CM

## 2015-09-04 LAB — PROTIME-INR
INR: 2.75 — ABNORMAL HIGH (ref 0.00–1.49)
Prothrombin Time: 28.7 seconds — ABNORMAL HIGH (ref 11.6–15.2)

## 2015-09-04 LAB — COMPREHENSIVE METABOLIC PANEL
ALBUMIN: 2.6 g/dL — AB (ref 3.5–5.0)
ALK PHOS: 66 U/L (ref 38–126)
ALT: 10 U/L — ABNORMAL LOW (ref 14–54)
ANION GAP: 9 (ref 5–15)
AST: 25 U/L (ref 15–41)
BUN: 18 mg/dL (ref 6–20)
CALCIUM: 7.9 mg/dL — AB (ref 8.9–10.3)
CHLORIDE: 89 mmol/L — AB (ref 101–111)
CO2: 35 mmol/L — AB (ref 22–32)
Creatinine, Ser: 1.23 mg/dL — ABNORMAL HIGH (ref 0.44–1.00)
GFR calc Af Amer: 45 mL/min — ABNORMAL LOW (ref >60)
GFR calc non Af Amer: 39 mL/min — ABNORMAL LOW (ref >60)
GLUCOSE: 114 mg/dL — AB (ref 65–99)
POTASSIUM: 3.5 mmol/L (ref 3.5–5.1)
SODIUM: 133 mmol/L — AB (ref 135–145)
Total Bilirubin: 1.1 mg/dL (ref 0.3–1.2)
Total Protein: 6.9 g/dL (ref 6.5–8.1)

## 2015-09-04 LAB — CBC
HEMATOCRIT: 28.9 % — AB (ref 36.0–46.0)
HEMOGLOBIN: 9 g/dL — AB (ref 12.0–15.0)
MCH: 30.5 pg (ref 26.0–34.0)
MCHC: 31.1 g/dL (ref 30.0–36.0)
MCV: 98 fL (ref 78.0–100.0)
Platelets: 113 10*3/uL — ABNORMAL LOW (ref 150–400)
RBC: 2.95 MIL/uL — ABNORMAL LOW (ref 3.87–5.11)
RDW: 13.7 % (ref 11.5–15.5)
WBC: 6.5 10*3/uL (ref 4.0–10.5)

## 2015-09-04 LAB — GLUCOSE, CAPILLARY
Glucose-Capillary: 104 mg/dL — ABNORMAL HIGH (ref 65–99)
Glucose-Capillary: 98 mg/dL (ref 65–99)
Glucose-Capillary: 148 mg/dL — ABNORMAL HIGH (ref 65–99)
Glucose-Capillary: 141 mg/dL — ABNORMAL HIGH (ref 65–99)

## 2015-09-04 MED ORDER — WARFARIN SODIUM 4 MG PO TABS
2.0000 mg | ORAL_TABLET | Freq: Once | ORAL | Status: AC
  Administered 2015-09-04: 2 mg via ORAL
  Filled 2015-09-04: qty 0.5

## 2015-09-04 MED ORDER — FUROSEMIDE 10 MG/ML IJ SOLN
60.0000 mg | Freq: Two times a day (BID) | INTRAMUSCULAR | Status: AC
  Administered 2015-09-04 (×2): 60 mg via INTRAVENOUS
  Filled 2015-09-04 (×2): qty 6

## 2015-09-04 NOTE — Progress Notes (Signed)
CSW met with pt and pt daughter to discuss resources.  Pt dtr is interested in anything that can help them financially.   CSW will follow up with list of available resources.  Domenica Reamer, [place] Social Worker [phone]

## 2015-09-04 NOTE — Progress Notes (Signed)
Pt a/o no c/o pain, pt oob to chair, pt receiving IV abx, VSS, pt stable

## 2015-09-04 NOTE — Progress Notes (Signed)
ANTICOAGULATION CONSULT NOTE - Follow Up Consult  Pharmacy Consult:  Coumadin Indication: atrial fibrillation  Allergies  Allergen Reactions  . Peach [Prunus Persica] Rash    AGENT: peach fuzz    Patient Measurements: Height: 5\' 4"  (162.6 cm) Weight: 231 lb 1.6 oz (104.826 kg) IBW/kg (Calculated) : 54.7  Vital Signs: Temp: 98.9 F (37.2 C) (09/27 0734) Temp Source: Oral (09/27 0734) BP: 123/64 mmHg (09/27 0734) Pulse Rate: 87 (09/27 0734)  Labs:  Recent Labs  09/01/15 1240 09/01/15 1844 09/02/15 0333 09/03/15 0226 09/04/15 0338  HGB  --   --  9.1*  --  9.0*  HCT  --   --  29.0*  --  28.9*  PLT  --   --  112*  --  113*  LABPROT  --   --  24.7* 28.3* 28.7*  INR  --   --  2.26* 2.71* 2.75*  HEPARINUNFRC 0.28* 0.28* 0.11*  --   --   CREATININE  --   --  1.37* 1.33* 1.23*    Estimated Creatinine Clearance: 38.7 mL/min (by C-G formula based on Cr of 1.23).   Assessment: 5 YOF with history of Afib to continue on Coumadin.  INR therapeutic and no bleeding reported.  She is requiring a low dose compared to PTA and this may be due to low po intake (low vitamin K intake) and/or treatment with antibiotics. Patient's platelet count has been trending down in the past several months.  Home coumadin dose: 2mg  daily, except 4mg  on Th/Sat  Goal of Therapy:  INR 2-3    Plan:  - Coumadin 2mg  PO today - Daily PT / INR  Hildred Laser, Pharm D 09/04/2015 8:24 AM

## 2015-09-04 NOTE — Progress Notes (Signed)
Physical Therapy Treatment Patient Details Name: [name] MRN: [id number] DOB: Apr 08, 1926 Today's Date: 09/04/2015    History of Present Illness Patient is an 81 yo female admitted 08/31/15 with severe sepsis, confusion, Afib with RVR, CHF.    PMH:  HTN, CHF, DM, neuropathy, Afib     PT Comments    Pt admitted with above diagnosis. Pt currently with functional limitations due to balance and endurance deficits. Pt able to ambulate short distance with fair safety.  Pt states she didn't walk a lot at home.  Pt did state she needs to incr her endurance as her legs feel like they may give way when she walks even short distances which is why NHP is recommended.  Pt will benefit from skilled PT to increase their independence and safety with mobility to allow discharge to the venue listed below.    Follow Up Recommendations  SNF;Supervision/Assistance - 24 hour     Equipment Recommendations  Hospital bed    Recommendations for Other Services       Precautions / Restrictions Precautions Precautions: Fall Precaution Comments: Watch HR Restrictions Weight Bearing Restrictions: No    Mobility  Bed Mobility Overal bed mobility: Needs Assistance Bed Mobility: Supine to Sit     Supine to sit: Min assist;+2 for physical assistance     General bed mobility comments: had pt get out on right hand side of bed since this is the side she gets out on at home, cannot reach with LUE for rail due to injury to left shoulder (05/19/15).  Transfers Overall transfer level: Needs assistance Equipment used: Rolling walker (2 wheeled) Transfers: Sit to/from [facility] Sit to Stand: Min assist Stand pivot transfers: Min assist       General transfer comment: cues for hand placement and safety.  Pt initially stand pivot transfer to 3N1.  Used bathroom and stoood with min guard assist with pt holding onto RW and steadying herself while PT performing pericare.     Ambulation/Gait Ambulation/Gait assistance: Min guard Ambulation Distance (Feet): 20 Feet Assistive device: Rolling walker (2 wheeled) Gait Pattern/deviations: Step-through pattern;Decreased stride length;Wide base of support   Gait velocity interpretation: Below normal speed for age/gender General Gait Details:  Cues for posture, position in RW, looking up and safety.  Pt tends to almost lean on RW.  Pt stated "my legs are going to give out" just prior to gettitng to chair however she did make it to chair with slightly uncontrolled descent into chair.     Stairs            Wheelchair Mobility    Modified Rankin (Stroke Patients Only)       Balance Overall balance assessment: Needs assistance;History of Falls Sitting-balance support: No upper extremity supported;Feet supported Sitting balance-Leahy Scale: Fair     Standing balance support: Bilateral upper extremity supported;During functional activity Standing balance-Leahy Scale: Poor Standing balance comment: relies on bil UE support.                      Cognition Arousal/Alertness: Awake/alert Behavior During Therapy: WFL for tasks assessed/performed Overall Cognitive Status: Within Functional Limits for tasks assessed                      Exercises General Exercises - Lower Extremity Long Arc Quad: AROM;Seated;Both;10 reps Hip Flexion/Marching: AROM;Seated;Both;10 reps    General Comments        Pertinent Vitals/Pain Pain Assessment: No/denies pain  VSS    Home Living                      Prior Function            PT Goals (current goals can now be found in the care plan section) Progress towards PT goals: Progressing toward goals    Frequency  Min 3X/week    PT Plan Current plan remains appropriate    Co-evaluation             End of Session Equipment Utilized During Treatment: Gait belt;Oxygen Activity Tolerance: Patient limited by fatigue Patient left:  in chair;with call bell/phone within reach;with chair alarm set     Time: 2841-3244 PT Time Calculation (min) (ACUTE ONLY): 31 min  Charges:  $Gait Training: 8-22 mins $Self Care/Home Management: 8-22                    G CodesIrwin Brakeman F 17-Sep-2015, 11:03 AM  [facility] [phone] (pager)

## 2015-09-04 NOTE — Progress Notes (Addendum)
[facility]                                                                              Patient Demographics  [name], is a 81 y.o. female, DOB - 05/17/1926, [id number]  Admit date - 08/31/2015   Admitting Physician Allie Bossier, MD  Outpatient Primary MD for the patient is Maximino Greenland, MD  LOS - 4   Chief Complaint  Patient presents with  . Code Sepsis      Interim summary 81 y.o. female, with hx HTN, D-HF, diabetes mellitus, GI bleed, DCIS who presented to the ER with vomiting and confusion. The patient felt poorly, developed a fever, and began to vomit. She was unable to eat meals. When she also became confused she was brought to the ED.  In the ER the patient had a temp of 102.2, pulse rate of 123, BP of 81/58. WBC is 19.8. Baseline creatinine 1.08 > admit (9/23) creatine 1.77. CXR showed chronic pulm edema and a small pleural effusion. Cardiology consulted, diuresing with IV lasix.   Assessment & Plan   Severe sepsis with Multisystem organ failure - Pyelonephritis (Escherichia coli) -Lactic acidosis and leukocytosis resolved -Initially placed on Zosyn and vancomycin, transitioned to ceftriaxone -Clinically much improved with resolution of septic hemodynamics -Palliative care was consulted, patient remains full code  Acute renal failure due to prerenal azotemia +/- ATN -baseline Cr ~ 1.08, Creatinine currently 1.23 -Continue to monitor BMP  Acute diastolic heart failure / Pulmonary HTN / Cardiomyopathy / Tricuspid valve regurgitation -Cardiology consultation appreciated -Continues to need IV diuresis -Continue to monitor intake, output, daily weights -Echo September 2016 shows EF 55-60% -Cardiology would like to torsemide 09/05/2015, 1 patients closer to her dry weight  Atrial fibrillation with RVR -RVR likely due to infection plus patient's inability to take medications -Cardiology consultation appreciated -Patient did require IV  amiodarone, transition to oral metoprolol -Continue Coumadin per pharmacy, INR 2.75  Hypokalemia  -Resolved, continue to monitor BMP and replace as needed  Hypomagnesemia -resolved w/ correction   Diabetes mellitus, type II -CBG well controlled - A1c 7.1 - does not appear to be on meds at home    Chronic normocytic anemia -Baseline hemoglobin of proximal line 9-10, currently 9 -Continue to monitor CBC  Physical deconditioning  -PT consulted, recommended SNF  Code Status: Full  Family Communication: None at bedside  Disposition Plan: Admitted. Currently on IV diuretics, plan for transition to PO 9/28.   Time Spent in minutes   30 minutes  Procedures  None  Consults   Cardiology Palliative care  DVT Prophylaxis  Coumadin  Lab Results  Component Value Date   PLT 113* 09/04/2015    Medications  Scheduled Meds: . antiseptic oral rinse  7 mL Mouth Rinse BID  . atorvastatin  20 mg Oral Daily  . cefTRIAXone (ROCEPHIN)  IV  2 g Intravenous Q24H  . docusate sodium  100 mg Oral BID  . furosemide  60 mg Intravenous BID  . insulin aspart  0-15 Units Subcutaneous TID WC  . metoprolol tartrate  25 mg Oral 4 times per day  . pantoprazole  40 mg Oral BID AC  . warfarin  2 mg Oral ONCE-1800  . Warfarin - Pharmacist Dosing Inpatient   Does not apply q1800   Continuous Infusions:  PRN Meds:.acetaminophen **OR** acetaminophen  Antibiotics    Anti-infectives    Start     Dose/Rate Route Frequency Ordered Stop   09/02/15 1400  cefTRIAXone (ROCEPHIN) 2 g in dextrose 5 % 50 mL IVPB     2 g 100 mL/hr over 30 Minutes Intravenous Every 24 hours 09/02/15 1303     09/01/15 1600  piperacillin-tazobactam (ZOSYN) IVPB 3.375 g  Status:  Discontinued     3.375 g 12.5 mL/hr over 240 Minutes Intravenous 3 times per day 09/01/15 1455 09/02/15 1106   09/01/15 1000  piperacillin-tazobactam (ZOSYN) IVPB 2.25 g  Status:  Discontinued     2.25 g 100 mL/hr over 30 Minutes Intravenous 3  times per day 08/31/15 1557 09/01/15 1455   08/31/15 1030  vancomycin (VANCOCIN) 1,500 mg in sodium chloride 0.9 % 500 mL IVPB  Status:  Discontinued     1,500 mg 250 mL/hr over 120 Minutes Intravenous Every 48 hours 08/31/15 1557 09/02/15 1106   08/31/15 1000  vancomycin (VANCOCIN) 2,000 mg in sodium chloride 0.9 % 500 mL IVPB     2,000 mg 250 mL/hr over 120 Minutes Intravenous STAT 08/31/15 0954 08/31/15 1305   08/31/15 1000  cefTAZidime (FORTAZ) 2 g in dextrose 5 % 50 mL IVPB     2 g 100 mL/hr over 30 Minutes Intravenous STAT 08/31/15 0954 08/31/15 1059      Subjective:   [name] seen and examined today.  Patient denies any chest pain at this time. Feels that her breathing has improved slightly. Denies any abdominal pain, nausea, vomiting, diarrhea or constipation.  Objective:   Filed Vitals:   09/03/15 2316 09/04/15 0400 09/04/15 0616 09/04/15 0734  BP: 116/68 103/54 115/69 123/64  Pulse: 110 85 120 87  Temp: 98.6 F (37 C) 99.9 F (37.7 C)  98.9 F (37.2 C)  TempSrc: Oral Oral  Oral  Resp: 18 18  18   Height:      Weight:  104.826 kg (231 lb 1.6 oz)    SpO2: 100% 94%  94%    Wt Readings from Last 3 Encounters:  09/04/15 104.826 kg (231 lb 1.6 oz)  08/28/15 105.348 kg (232 lb 4 oz)  08/17/15 105.6 kg (232 lb 12.9 oz)     Intake/Output Summary (Last 24 hours) at 09/04/15 1127 Last data filed at 09/04/15 1125  Gross per 24 hour  Intake    850 ml  Output   1750 ml  Net   -900 ml    Exam  General: Well developed, well nourished, NAD, appears stated age  HEENT: NCAT, mucous membranes moist.   Neck: Supple, + JVD, no masses  Cardiovascular: S1 S2 auscultated, Irregularly irregular  Respiratory: Diminished breath sounds particularly in the bases.  Left sided rales  Abdomen: Soft, nontender, nondistended, + bowel sounds  Extremities: warm dry without cyanosis clubbing. Trace LE edema  Neuro: AAOx3, nonfocal   Psych: Normal affect and  demeanor  Data Review   Micro Results Recent Results (from the past 240 hour(s))  Culture, blood (routine x 2)     Status: None (Preliminary result)   Collection Time: 08/31/15  9:40 AM  Result Value Ref Range Status   Specimen Description BLOOD RIGHT HAND  Final   Special Requests BOTTLES DRAWN AEROBIC AND ANAEROBIC 5CC  Final   Culture NO GROWTH 3 DAYS  Final  Report Status PENDING  Incomplete  Culture, blood (routine x 2)     Status: None (Preliminary result)   Collection Time: 08/31/15  9:46 AM  Result Value Ref Range Status   Specimen Description BLOOD LEFT HAND  Final   Special Requests BOTTLES DRAWN AEROBIC AND ANAEROBIC 5CC  Final   Culture NO GROWTH 3 DAYS  Final   Report Status PENDING  Incomplete  Urine culture     Status: None   Collection Time: 08/31/15 10:35 AM  Result Value Ref Range Status   Specimen Description URINE, CATHETERIZED  Final   Special Requests NONE  Final   Culture >=100,000 COLONIES/mL ESCHERICHIA COLI  Final   Report Status 09/02/2015 FINAL  Final   Organism ID, Bacteria ESCHERICHIA COLI  Final      Susceptibility   Escherichia coli - MIC*    AMPICILLIN >=32 RESISTANT Resistant     CEFAZOLIN <=4 SENSITIVE Sensitive     CEFTRIAXONE <=1 SENSITIVE Sensitive     CIPROFLOXACIN <=0.25 SENSITIVE Sensitive     GENTAMICIN <=1 SENSITIVE Sensitive     IMIPENEM <=0.25 SENSITIVE Sensitive     NITROFURANTOIN <=16 SENSITIVE Sensitive     TRIMETH/SULFA >=320 RESISTANT Resistant     AMPICILLIN/SULBACTAM 16 INTERMEDIATE Intermediate     PIP/TAZO <=4 SENSITIVE Sensitive     * >=100,000 COLONIES/mL ESCHERICHIA COLI  MRSA PCR Screening     Status: None   Collection Time: 08/31/15  4:49 PM  Result Value Ref Range Status   MRSA by PCR NEGATIVE NEGATIVE Final    Comment:        The GeneXpert MRSA Assay (FDA approved for NASAL specimens only), is one component of a comprehensive MRSA colonization surveillance program. It is not intended to diagnose  MRSA infection nor to guide or monitor treatment for MRSA infections.     Radiology Reports Dg Chest 2 View  09/01/2015   CLINICAL DATA:  CHF.  EXAM: CHEST  2 VIEW  COMPARISON:  08/31/2015  FINDINGS: Cardiomegaly with vascular congestion. Diffuse interstitial prominence throughout the lungs again noted, stable. This could reflect chronic interstitial lung disease or edema. Favor chronic interstitial disease. No effusions. No acute bony abnormality.  IMPRESSION: Cardiomegaly with vascular congestion.  Stable diffuse interstitial prominence which could reflect chronic interstitial lung disease or edema.   Electronically Signed   By: Rolm Baptise M.D.   On: 09/01/2015 09:24   Dg Chest 2 View  08/31/2015   CLINICAL DATA:  Fever today.  The patient feels ill.  EXAM: CHEST  2 VIEW  COMPARISON:  PA and lateral chest 06/08/2015, 10/31/2013 and 08/28/2012.  FINDINGS: There is cardiomegaly and pulmonary vascular congestion. No consolidative process or pneumothorax is identified. There may be a very small left pleural effusion. Aortic atherosclerosis is noted.  IMPRESSION: Very small left pleural effusion.  No evidence of pneumonia.  Cardiomegaly and chronic pulmonary vascular congestion.   Electronically Signed   By: Inge Rise M.D.   On: 08/31/2015 10:06    CBC  Recent Labs Lab 08/31/15 0952 08/31/15 2344 09/01/15 0416 09/02/15 0333 09/04/15 0338  WBC 19.8* 20.4* 18.3* 11.8* 6.5  HGB 9.6* 9.5* 9.4* 9.1* 9.0*  HCT 31.1* 29.8* 29.8* 29.0* 28.9*  PLT 146* 117* 112* 112* 113*  MCV 99.7 99.0 98.0 98.0 98.0  MCH 30.8 31.6 30.9 30.7 30.5  MCHC 30.9 31.9 31.5 31.4 31.1  RDW 13.8 13.8 13.8 13.8 13.7  LYMPHSABS 1.8  --   --  2.0  --  MONOABS 1.5*  --   --  1.0  --   EOSABS 0.0  --   --  0.0  --   BASOSABS 0.0  --   --  0.0  --     Chemistries   Recent Labs Lab 08/31/15 0952 08/31/15 2344 09/01/15 0416 09/02/15 0333 09/03/15 0226 09/04/15 0338  NA 137 135 135 133* 132* 133*  K 4.0  3.9 3.6 3.1* 4.7 3.5  CL 91* 92* 93* 91* 94* 89*  CO2 37* 30 33* 33* 32 35*  GLUCOSE 211* 166* 161* 92 97 114*  BUN 28* 23* 24* 22* 24* 18  CREATININE 1.77* 1.42* 1.40* 1.37* 1.33* 1.23*  CALCIUM 8.7* 8.3* 8.2* 8.0* 7.8* 7.9*  MG  --   --   --  1.7 2.3  --   AST 22  --  20 22 37 25  ALT 8*  --  7* 8* 11* 10*  ALKPHOS 76  --  75 70 67 66  BILITOT 1.1  --  1.2 1.5* 2.0* 1.1   ------------------------------------------------------------------------------------------------------------------ estimated creatinine clearance is 38.7 mL/min (by C-G formula based on Cr of 1.23). ------------------------------------------------------------------------------------------------------------------ No results for input(s): HGBA1C in the last 72 hours. ------------------------------------------------------------------------------------------------------------------ No results for input(s): CHOL, HDL, LDLCALC, TRIG, CHOLHDL, LDLDIRECT in the last 72 hours. ------------------------------------------------------------------------------------------------------------------ No results for input(s): TSH, T4TOTAL, T3FREE, THYROIDAB in the last 72 hours.  Invalid input(s): FREET3 ------------------------------------------------------------------------------------------------------------------ No results for input(s): VITAMINB12, FOLATE, FERRITIN, TIBC, IRON, RETICCTPCT in the last 72 hours.  Coagulation profile  Recent Labs Lab 08/31/15 2035 09/01/15 0416 09/02/15 0333 09/03/15 0226 09/04/15 0338  INR 1.71* 1.95* 2.26* 2.71* 2.75*    No results for input(s): DDIMER in the last 72 hours.  Cardiac Enzymes No results for input(s): CKMB, TROPONINI, MYOGLOBIN in the last 168 hours.  Invalid input(s): CK ------------------------------------------------------------------------------------------------------------------ Invalid input(s): POCBNP    MIKHAIL, MARYANN D.O. on 09/04/2015 at 11:27  AM  Between 7am to 7pm - Pager - [phone]  After 7pm go to [email or web address] - password [email or web address]  And look for the night coverage person covering for me after hours  [facility] Office  [phone]

## 2015-09-04 NOTE — Progress Notes (Signed)
CSW met with pt family and provided information on transportation, food, and DHHS resources.  No further CSW needs identified at this time.  CSW signing off.  Domenica Reamer, [place] Social Worker [phone]

## 2015-09-04 NOTE — Progress Notes (Signed)
Patient Name: [name] Date of Encounter: 09/04/2015  Primary Cardiologist: Dr. Aundra Dubin (for CHF) ; Primary Cardiology - [facility]   Principal Problem:   Severe sepsis with acute organ dysfunction Active Problems:   HYPERCHOLESTEROLEMIA   Chronic atrial fibrillation- CHADs VASc =6   Mitral valve regurgitation with secondary PH   Chronic anticoagulation-Warfarin   Pulmonary hypertension, moderate to severe   Pyelonephritis   Atrial fibrillation with RVR   Subtherapeutic international normalized ratio (INR)   Acute renal failure   Palliative care encounter   Multisystem organ failure   Cardiomyopathy   Pulmonary hypertension   Tricuspid valve regurgitation   Blood poisoning   Acute pyelonephritis   Sepsis due to Escherichia coli   Hypokalemia   Hypomagnesemia   CHF (congestive heart failure)    SUBJECTIVE  Denies any CP, still a little SOB.  CURRENT MEDS . antiseptic oral rinse  7 mL Mouth Rinse BID  . atorvastatin  20 mg Oral Daily  . cefTRIAXone (ROCEPHIN)  IV  2 g Intravenous Q24H  . docusate sodium  100 mg Oral BID  . insulin aspart  0-15 Units Subcutaneous TID WC  . metoprolol tartrate  25 mg Oral 4 times per day  . pantoprazole  40 mg Oral BID AC  . Warfarin - Pharmacist Dosing Inpatient   Does not apply q1800    OBJECTIVE  Filed Vitals:   09/03/15 2316 09/04/15 0400 09/04/15 0616 09/04/15 0734  BP: 116/68 103/54 115/69 123/64  Pulse: 110 85 120 87  Temp: 98.6 F (37 C) 99.9 F (37.7 C)  98.9 F (37.2 C)  TempSrc: Oral Oral  Oral  Resp: 18 18  18   Height:      Weight:  231 lb 1.6 oz (104.826 kg)    SpO2: 100% 94%  94%    Intake/Output Summary (Last 24 hours) at 09/04/15 0807 Last data filed at 09/04/15 0433  Gross per 24 hour  Intake   1010 ml  Output   1300 ml  Net   -290 ml   Filed Weights   09/03/15 0500 09/03/15 1509 09/04/15 0400  Weight: 236 lb 8.9 oz (107.3 kg) 233 lb 11.2 oz (106.006 kg) 231 lb 1.6 oz (104.826 kg)     PHYSICAL EXAM  General: Pleasant, NAD. Neuro: Alert and oriented X 3. Moves all extremities spontaneously. Psych: Normal affect. HEENT:  Normal  Neck: Supple without bruits +JVD. - (from Pulm HTN & TR) Lungs:  Resp regular and unlabored. Decreased breath sound in bilateral bases, rale in L base. Heart: tachycardic, irregular. no s3, s4, or murmurs. Abdomen: Soft, non-tender, non-distended, BS + x 4.  Extremities: No clubbing, cyanosis. DP/PT/Radials 2+ and equal bilaterally. Trace edema in bilateral LE; but less than yesterday.  Accessory Clinical Findings  CBC  Recent Labs  09/02/15 0333 09/04/15 0338  WBC 11.8* 6.5  NEUTROABS 8.7*  --   HGB 9.1* 9.0*  HCT 29.0* 28.9*  MCV 98.0 98.0  PLT 112* 081*   Basic Metabolic Panel  Recent Labs  09/02/15 0333 09/03/15 0226 09/04/15 0338  NA 133* 132* 133*  K 3.1* 4.7 3.5  CL 91* 94* 89*  CO2 33* 32 35*  GLUCOSE 92 97 114*  BUN 22* 24* 18  CREATININE 1.37* 1.33* 1.23*  CALCIUM 8.0* 7.8* 7.9*  MG 1.7 2.3  --    Liver Function Tests  Recent Labs  09/03/15 0226 09/04/15 0338  AST 37 25  ALT 11* 10*  ALKPHOS 67 66  BILITOT 2.0* 1.1  PROT 5.9* 6.9  ALBUMIN 2.3* 2.6*    TELE afib with HR high 90s-100s    ECG  No new EKG  Echocardiogram 08/09/2015  LV EF: 55% -  60%  ------------------------------------------------------------------- Indications:   CHF - 428.0.  ------------------------------------------------------------------- History:  PMH: Mitral regurgitation. Dyspnea. Primary pulmonary hypertension. Risk factors: Hypertension. Dyslipidemia.  ------------------------------------------------------------------- Study Conclusions  - Left ventricle: The cavity size was normal. There was severe concentric hypertrophy. Systolic function was normal. The estimated ejection fraction was in the range of 55% to 60%. Wall motion was normal; there were no regional wall motion abnormalities. Doppler  parameters are consistent with high ventricular filling pressure. - Mitral valve: Moderately calcified annulus. Mildly thickened, moderately calcified leaflets . - Left atrium: The atrium was mildly dilated. - Right ventricle: The cavity size was moderately dilated. Wall thickness was normal. Systolic function was moderately reduced. - Right atrium: The atrium was severely dilated. - Tricuspid valve: There was moderate regurgitation. - Pulmonary arteries: Systolic pressure was severely increased. PA peak pressure: 93 mm Hg (S).  Impressions:  - Compared to the prior study, there has been no significant interval change.    Radiology/Studies  Dg Chest 2 View  09/01/2015   CLINICAL DATA:  CHF.  EXAM: CHEST  2 VIEW  COMPARISON:  08/31/2015  FINDINGS: Cardiomegaly with vascular congestion. Diffuse interstitial prominence throughout the lungs again noted, stable. This could reflect chronic interstitial lung disease or edema. Favor chronic interstitial disease. No effusions. No acute bony abnormality.  IMPRESSION: Cardiomegaly with vascular congestion.  Stable diffuse interstitial prominence which could reflect chronic interstitial lung disease or edema.   Electronically Signed   By: Rolm Baptise M.D.   On: 09/01/2015 09:24   Dg Chest 2 View  08/31/2015   CLINICAL DATA:  Fever today.  The patient feels ill.  EXAM: CHEST  2 VIEW  COMPARISON:  PA and lateral chest 06/08/2015, 10/31/2013 and 08/28/2012.  FINDINGS: There is cardiomegaly and pulmonary vascular congestion. No consolidative process or pneumothorax is identified. There may be a very small left pleural effusion. Aortic atherosclerosis is noted.  IMPRESSION: Very small left pleural effusion.  No evidence of pneumonia.  Cardiomegaly and chronic pulmonary vascular congestion.   Electronically Signed   By: Inge Rise M.D.   On: 08/31/2015 10:06    ASSESSMENT AND PLAN  60F with chronic atrial fibrillation on Coumadin, HTN, chronic  diastolic HF, DM, GIB, DCIS, mitral regurgitation (unclear how significant - trivial per 08/2015 echo but 3+ by 05/2015 cath), OHS on home O2, morbid obesity, HLD, minimal CAD by cath 05/2015. Admitted with vomiting, confusion (acute toxic metabolic encephalopathy), fever/leukocytosis and found to have severe sepsis with multiorgan failure due to E. Coli pyelonephritis. Hospital course notable for AKI 2/2 pre-renal azotemia and ATN, hypokalemia, hypomagnesemia, and atrial fib RVR. We were asked to follow as she has been seen by Dr. Aundra Dubin, but she does not Dr. Terrence Dupont is her cardiologist.   1. Chronic atrial fibrillation with RVR - likely exacerbated by sepsis; rate improved from yesterday   - previously on metoprolol 12.5 BID and coumadin  - metoprolol changed to IV lopressor after septic shock began to improve, transitioned to metoprolol tartrate 25 q6Hr yesterday  - currently HR high 90s-100s, expect HR continue to improve as urosepsis and HF improve. If HR continue to be high, may consider low dose digoxin despite stage III CKD.   2. Acute on chronic diastolic HF (with Pulm HTN)  - 08/2015  echo LVEF 55-60%, no WMAs, high LA pressure, mod RV dysfunction, PASP 93  - on torsemide 40mg  in AM and 20mg  in PM at home  - she still appears to be volume overloaded, only trace amount of edema in the LE, but has JVD and diminished breath sound in bilateral base with L basilar rale on exam.   - will give 2 doses of IV lasix 60mg  BID and see response; then place back on home doses.  3. Pulmonary HTN - likley cause of JVD; plan is to get her back to baseline wgt ~230-232lb   4. Urosepsis - per IM  Signed, Almyra Deforest PA-C Pager: [phone]  I have seen, examined and evaluated the patient this Am along with Mr. Eulas Post, [place].  After reviewing all the available data and chart,  I agree with his findings, examination as well as impression recommendations.  HR seems better controlled & less volume overloaded - still a  bit volume overload, so giving a couple of IV lasix doses today, but would convert to PO Torsemide tomorrow since she is close to dry wgt.   Therapeutic INR per pharmacy.    Leonie Man, M.D., M.S. Interventional Cardiologist   Pager # [phone]

## 2015-09-05 ENCOUNTER — Inpatient Hospital Stay: Admit: 2015-09-05 | Payer: Commercial Managed Care - HMO

## 2015-09-05 DIAGNOSIS — N183 Chronic kidney disease, stage 3 unspecified: Secondary | ICD-10-CM

## 2015-09-05 DIAGNOSIS — N179 Acute kidney failure, unspecified: Secondary | ICD-10-CM

## 2015-09-05 DIAGNOSIS — N10 Acute tubulo-interstitial nephritis: Secondary | ICD-10-CM

## 2015-09-05 LAB — CBC
HEMATOCRIT: 28 % — AB (ref 36.0–46.0)
Hemoglobin: 8.4 g/dL — ABNORMAL LOW (ref 12.0–15.0)
MCH: 29.2 pg (ref 26.0–34.0)
MCHC: 30 g/dL (ref 30.0–36.0)
MCV: 97.2 fL (ref 78.0–100.0)
PLATELETS: 129 10*3/uL — AB (ref 150–400)
RBC: 2.88 MIL/uL — ABNORMAL LOW (ref 3.87–5.11)
RDW: 13.5 % (ref 11.5–15.5)
WBC: 7.4 10*3/uL (ref 4.0–10.5)

## 2015-09-05 LAB — BASIC METABOLIC PANEL
Anion gap: 8 (ref 5–15)
BUN: 14 mg/dL (ref 6–20)
CHLORIDE: 89 mmol/L — AB (ref 101–111)
CO2: 36 mmol/L — AB (ref 22–32)
CREATININE: 1.06 mg/dL — AB (ref 0.44–1.00)
Calcium: 7.9 mg/dL — ABNORMAL LOW (ref 8.9–10.3)
GFR calc Af Amer: 54 mL/min — ABNORMAL LOW (ref >60)
GFR calc non Af Amer: 46 mL/min — ABNORMAL LOW (ref >60)
GLUCOSE: 109 mg/dL — AB (ref 65–99)
POTASSIUM: 3.2 mmol/L — AB (ref 3.5–5.1)
Sodium: 133 mmol/L — ABNORMAL LOW (ref 135–145)

## 2015-09-05 LAB — CULTURE, BLOOD (ROUTINE X 2)
CULTURE: NO GROWTH
CULTURE: NO GROWTH

## 2015-09-05 LAB — GLUCOSE, CAPILLARY
Glucose-Capillary: 112 mg/dL — ABNORMAL HIGH (ref 65–99)
Glucose-Capillary: 120 mg/dL — ABNORMAL HIGH (ref 65–99)
Glucose-Capillary: 135 mg/dL — ABNORMAL HIGH (ref 65–99)

## 2015-09-05 LAB — PROTIME-INR
INR: 2.62 — ABNORMAL HIGH (ref 0.00–1.49)
Prothrombin Time: 27.6 seconds — ABNORMAL HIGH (ref 11.6–15.2)

## 2015-09-05 MED ORDER — METOPROLOL TARTRATE 50 MG PO TABS
50.0000 mg | ORAL_TABLET | Freq: Two times a day (BID) | ORAL | Status: DC
  Administered 2015-09-05 – 2015-09-06 (×2): 50 mg via ORAL
  Filled 2015-09-05 (×2): qty 1

## 2015-09-05 MED ORDER — WARFARIN SODIUM 4 MG PO TABS
2.0000 mg | ORAL_TABLET | Freq: Once | ORAL | Status: AC
  Administered 2015-09-05: 2 mg via ORAL
  Filled 2015-09-05: qty 0.5

## 2015-09-05 MED ORDER — POTASSIUM CHLORIDE CRYS ER 20 MEQ PO TBCR
40.0000 meq | EXTENDED_RELEASE_TABLET | Freq: Once | ORAL | Status: AC
  Administered 2015-09-05: 40 meq via ORAL
  Filled 2015-09-05: qty 2

## 2015-09-05 MED ORDER — DEXTROSE 5 % IV SOLN
1.0000 g | INTRAVENOUS | Status: DC
  Filled 2015-09-05: qty 10

## 2015-09-05 MED ORDER — FUROSEMIDE 10 MG/ML IJ SOLN
60.0000 mg | Freq: Once | INTRAMUSCULAR | Status: AC
  Administered 2015-09-05: 60 mg via INTRAVENOUS
  Filled 2015-09-05: qty 6

## 2015-09-05 MED ORDER — TORSEMIDE 20 MG PO TABS
40.0000 mg | ORAL_TABLET | Freq: Two times a day (BID) | ORAL | Status: DC
  Administered 2015-09-05 – 2015-09-06 (×2): 40 mg via ORAL
  Filled 2015-09-05 (×2): qty 2

## 2015-09-05 NOTE — Care Management Note (Signed)
Case Management Note  Patient Details  Name: VONZELLA ALTHAUS MRN: [id number] Date of Birth: [birth year]  Subjective/Objective:      Admitted with Severe sepsis              Action/Plan: Patient is active with [facility] as prior to admission for nursing/ pt/ot/ nurses aide. CM talked to patient and she is refusing to go to SNF at this time and states that she wants to return home at discharge.  Expected Discharge Date:    possibly 09/06/2015              Expected Discharge Plan:  [place]  Discharge planning Services  CM Consult  Choice offered to:  Patient  HH Arranged:  RN, PT, OT, Nurse's Aide [place] Agency:  [place]  Status of Service:  In process, will continue to follow  Medicare Important Message Given:  [facility] notification given  Sherrilyn Rist [phone] 09/05/2015, 2:54 PM

## 2015-09-05 NOTE — Progress Notes (Signed)
ANTICOAGULATION CONSULT NOTE - Follow Up Consult  Pharmacy Consult:  Coumadin Indication: atrial fibrillation  Allergies  Allergen Reactions  . Peach [Prunus Persica] Rash    AGENT: peach fuzz    Patient Measurements: Height: 5\' 4"  (162.6 cm) Weight: 229 lb 6.4 oz (104.055 kg) (scale A) IBW/kg (Calculated) : 54.7  Vital Signs: Temp: 98.7 F (37.1 C) (09/28 0414) Temp Source: Oral (09/28 0414) BP: 114/67 mmHg (09/28 0436) Pulse Rate: 97 (09/28 0414)  Labs:  Recent Labs  09/03/15 0226 09/04/15 0338 09/05/15 0359  HGB  --  9.0* 8.4*  HCT  --  28.9* 28.0*  PLT  --  113* 129*  LABPROT 28.3* 28.7* 27.6*  INR 2.71* 2.75* 2.62*  CREATININE 1.33* 1.23* 1.06*    Estimated Creatinine Clearance: 44.8 mL/min (by C-G formula based on Cr of 1.06).   Assessment: 73 YOF with history of Afib to continue on Coumadin.  INR therapeutic and no bleeding reported.  She is requiring a low dose compared to PTA and this may be due to low po intake (low vitamin K intake) and/or treatment with antibiotics.   Home coumadin dose: 2mg  daily, except 4mg  on Th/Sat  Goal of Therapy:  INR 2-3    Plan:  - Coumadin 2mg  PO today - Daily PT / INR  Hildred Laser, Pharm D 09/05/2015 8:25 AM

## 2015-09-05 NOTE — Progress Notes (Signed)
recheck

## 2015-09-05 NOTE — Progress Notes (Signed)
Patient Name: [name] Date of Encounter: 09/05/2015  Principal Problem:   Severe sepsis with acute organ dysfunction Active Problems:   HYPERCHOLESTEROLEMIA   Chronic atrial fibrillation- CHADs VASc =6   Mitral valve regurgitation with secondary PH   Chronic anticoagulation-Warfarin   Pulmonary hypertension, moderate to severe   Pyelonephritis   Atrial fibrillation with RVR   Subtherapeutic international normalized ratio (INR)   Acute renal failure   Palliative care encounter   Multisystem organ failure   Cardiomyopathy   Pulmonary hypertension   Tricuspid valve regurgitation   Blood poisoning   Acute pyelonephritis   Sepsis due to Escherichia coli   Hypokalemia   Hypomagnesemia   CHF (congestive heart failure)   UTI (lower urinary tract infection)   Acute renal failure superimposed on stage 3 chronic kidney disease   Primary Cardiologist: Dr. Aundra Dubin (for CHF); Dr. Terrence Dupont (Primary Cardiologist) Patient Profile: [phone] w/ PMF of chronic a-fib (on Coumadin), HTN, HLD, chronic diastolic HF, OHS, DM, GIB, DCIS, mitral regurgitation, Stage 3 CKD, and morbid obesity admitted 08/31/2015 for vomiting, confusion (acute toxic metabolic encephalopathy), fever/leukocytosis and found to have severe sepsis with multiorgan failure due to E. Coli pyelonephritis. Hospital course notable for AKI 2/2 pre-renal azotemia and ATN, hypokalemia, hypomagnesemia, and atrial fib w/ RVR  SUBJECTIVE: Says her breathing is "about the same". Denies any chest pain or palpitations.  OBJECTIVE Filed Vitals:   09/05/15 0035 09/05/15 0414 09/05/15 0436 09/05/15 0825  BP: 124/68 69/50 114/67 114/60  Pulse: 91 97  85  Temp: 98.4 F (36.9 C) 98.7 F (37.1 C)  98.7 F (37.1 C)  TempSrc: Oral Oral  Oral  Resp: 18 20  20   Height:      Weight:  229 lb 6.4 oz (104.055 kg)    SpO2: 98% 99%  100%    Intake/Output Summary (Last 24 hours) at 09/05/15 0936 Last data filed at 09/05/15 0900  Gross  per 24 hour  Intake    440 ml  Output   1302 ml  Net   -862 ml   Filed Weights   09/03/15 1509 09/04/15 0400 09/05/15 0414  Weight: 233 lb 11.2 oz (106.006 kg) 231 lb 1.6 oz (104.826 kg) 229 lb 6.4 oz (104.055 kg)    PHYSICAL EXAM General: Well developed, well nourished, female in no acute distress. Head: Normocephalic, atraumatic.  Neck: Supple without bruits, JVD mildly elevated. Lungs:  Resp regular and unlabored, decreased breath sounds at bases bilaterally, minimal rales. Heart: Irregularly irregular , S1, S2, no S3, S4, 3/6 murmur at apex; no rub. Abdomen: Soft, non-tender, non-distended with normoactive bowel sounds. No hepatomegaly. No rebound/guarding. No obvious abdominal masses. Extremities: No clubbing or cyanosis, trace edema. Distal pedal pulses are 2+ bilaterally. Neuro: Alert and oriented X 3. Moves all extremities spontaneously. Psych: Normal affect.   LABS: CBC: Recent Labs  09/04/15 0338 09/05/15 0359  WBC 6.5 7.4  HGB 9.0* 8.4*  HCT 28.9* 28.0*  MCV 98.0 97.2  PLT 113* 129*   INR: Recent Labs  09/05/15 0359  INR 5.40*   Basic Metabolic Panel: Recent Labs  09/03/15 0226 09/04/15 0338 09/05/15 0359  NA 132* 133* 133*  K 4.7 3.5 3.2*  CL 94* 89* 89*  CO2 32 35* 36*  GLUCOSE 97 114* 109*  BUN 24* 18 14  CREATININE 1.33* 1.23* 1.06*  CALCIUM 7.8* 7.9* 7.9*  MG 2.3  --   --    Liver Function Tests: Recent Labs  09/03/15  0226 09/04/15 0338  AST 37 25  ALT 11* 10*  ALKPHOS 67 66  BILITOT 2.0* 1.1  PROT 5.9* 6.9  ALBUMIN 2.3* 2.6*   BNP:  B NATRIURETIC PEPTIDE  Date/Time Value Ref Range Status  08/31/2015 08:35 PM 874.5* 0.0 - 100.0 pg/mL Final  08/09/2015 11:55 AM 561.8* 0.0 - 100.0 pg/mL Final   PRO B NATRIURETIC PEPTIDE (BNP)  Date/Time Value Ref Range Status  06/08/2015 04:19 PM 370.0* 0.0 - 100.0 pg/mL Final  10/31/2013 11:28 AM 1548.0* 0 - 450 pg/mL Final   TELE: Atrial fibrillation with rate in 80's - 90's.         ECHO: 08/09/2015 Study Conclusions - Left ventricle: The cavity size was normal. There was severe concentric hypertrophy. Systolic function was normal. The estimated ejection fraction was in the range of 55% to 60%. Wall motion was normal; there were no regional wall motion abnormalities. Doppler parameters are consistent with high ventricular filling pressure. - Mitral valve: Moderately calcified annulus. Mildly thickened, moderately calcified leaflets . - Left atrium: The atrium was mildly dilated. - Right ventricle: The cavity size was moderately dilated. Wall thickness was normal. Systolic function was moderately reduced. - Right atrium: The atrium was severely dilated. - Tricuspid valve: There was moderate regurgitation. - Pulmonary arteries: Systolic pressure was severely increased. PA peak pressure: 93 mm Hg (S).  Impressions: - Compared to the prior study, there has been no significant interval change.   Current Medications:  . antiseptic oral rinse  7 mL Mouth Rinse BID  . atorvastatin  20 mg Oral Daily  . cefTRIAXone (ROCEPHIN)  IV  2 g Intravenous Q24H  . docusate sodium  100 mg Oral BID  . insulin aspart  0-15 Units Subcutaneous TID WC  . metoprolol tartrate  50 mg Oral BID  . pantoprazole  40 mg Oral BID AC  . warfarin  2 mg Oral ONCE-1800  . Warfarin - Pharmacist Dosing Inpatient   Does not apply q1800      ASSESSMENT AND PLAN:  1. Chronic atrial fibrillation with RVR  - likely exacerbated by sepsis. Had previously been on metoprolol 12.5 BID and coumadin - Was on Metoprolol Tartrate 25mg  QID. Switched to 50mg  BID today. Rate has been in 80' -90's.  - INR therapeutic  2. Acute on chronic diastolic HF (with Pulm HTN) - 08/2015 echo LVEF 55-60%, no WMAs, high LA pressure, mod RV dysfunction, PASP 93. PTA was on torsemide 40mg   - dry weight documented as being between 230lbs - 232lbs and 218-222lbs. Weight was 229lbs at admission, at that  current weight today (family noted dry wgt ~228 lb). Patient is unable to say how much she had been weighing at home recently. In looking at records, her weight was 222lbs in 04/2015, 222lbs in 05/2015, and 232lbs at discharge on 08/17/2015. - was net -929mL yesterday. Overall, +3.0L since admission.  - given IV Lasix 60mg  IV BID on 09/04/2015 and an additional dose of 60mg  IV this AM. Will switch to PO Torsemide 40 mg this evening, then resume 40mg  dosing in AM and 20mg  dosing in the PM (Dosing PTA).  3. Pulmonary HTN  - receiving Lasix 60mg  IV BID. Will switch to PO Torsemide as outlined above.  4. Severe Sepsis with multisystem organ failure secondary to Pyelonephritis  - per admitting team  5. Acute on Chronic Renal Failure -baseline creatinine 0.9-1.2. Improved to 1.06 on 09/05/2015.    Signed, Erma Heritage , PA-C 9:36 AM 09/05/2015 Pager: [phone]  I have seen, examined and evaluated the patient this AM along with Ms. Lynne Leader.  After reviewing all the available data and chart,  I agree with her findings, examination as well as impression recommendations.  HR still can go up some, but reluctant to push BB further due to BP concerns.  I believe the dry wgt of ~228-229 per her Dtr (who clearly is more read in on her condition). -- OK with IV Lasix this AM,but would restart PO Torsemide with her evening dose & home regimen in AM.  I think that she is essentially stable & near baseline from a cardiac standpoint for possible d/c tomorrow.    Leonie Man, M.D., M.S. Interventional Cardiologist   Pager # [phone]

## 2015-09-05 NOTE — Progress Notes (Signed)
PROGRESS NOTE  [name] FYB:[id number] DOB: 17-Jun-1926 DOA: 08/31/2015 PCP: Maximino Greenland, MD  Brief History 81 y.o. female, with hx HTN, D-HF, diabetes mellitus, GI bleed, DCIS who presented to the ER with vomiting and confusion. The patient felt poorly, developed a fever, and began to vomit. She was unable to eat meals. When she also became confused she was brought to the ED.  In the ER the patient had a temp of 102.2, pulse rate of 123, BP of 81/58. WBC is 19.8. Initial lactic acid 2.2. Baseline creatinine 1.08 > admit (9/23) creatine 1.77. CXR showed chronic pulm edema and a small pleural effusion. Cardiology consulted, diuresing with IV lasix Assessment/Plan: Severe sepsis with Multisystem organ failure - Pyelonephritis (Escherichia coli) -Lactic acidosis and leukocytosis resolved -Initially placed on Zosyn and vancomycin, transitioned to ceftriaxone -Clinically much improved with resolution of septic hemodynamics -Palliative care was consulted, patient remains full code  Acute on chronic renal failure (CKD 2-3) -due to prerenal azotemia +/- ATN -baseline Cr ~ 0.9-1.2 -improved -Continue to monitor BMP  Acute diastolic heart failure / Pulmonary HTN / Cardiomyopathy / Tricuspid valve regurgitation -Cardiology consultation appreciated -Continues to need IV diuresis -moderate TR may be contributing to JVD -Echo September 2016 shows EF 55-60% -09/05/15--still appears a little hypervolemic---give another dose IV furosemide today -DRY WEIGHT 218-222  -negative 6 lbs since admission -give another dose IV furosemide today -switch to po torsemide when closer to dry weight  Atrial fibrillation with RVR -RVR likely due to infection plus patient's inability to take medications -Cardiology consultation appreciated -Patient did require IV amiodarone, transition to oral metoprolol -Continue Coumadin per pharmacy -CHADS-VASc= 6  Hypokalemia  -Resolved,  continue to monitor BMP and replace as needed  Hypomagnesemia -resolved w/ correction   Diabetes mellitus, type II -CBG well controlled - A1c 7.1 - does not appear to be on meds at home   Chronic normocytic anemia -Baseline hemoglobin of proximal line 9-10, currently 9 -Continue to monitor CBC  Physical deconditioning  -PT consulted, recommended SNF--pt refuses  Code Status: Full  Family Communication: None at bedside  Disposition Plan: Admitted. Currently on IV diuretics,          Procedures/Studies: Dg Chest 2 View  09/01/2015   CLINICAL DATA:  CHF.  EXAM: CHEST  2 VIEW  COMPARISON:  08/31/2015  FINDINGS: Cardiomegaly with vascular congestion. Diffuse interstitial prominence throughout the lungs again noted, stable. This could reflect chronic interstitial lung disease or edema. Favor chronic interstitial disease. No effusions. No acute bony abnormality.  IMPRESSION: Cardiomegaly with vascular congestion.  Stable diffuse interstitial prominence which could reflect chronic interstitial lung disease or edema.   Electronically Signed   By: Rolm Baptise M.D.   On: 09/01/2015 09:24   Dg Chest 2 View  08/31/2015   CLINICAL DATA:  Fever today.  The patient feels ill.  EXAM: CHEST  2 VIEW  COMPARISON:  PA and lateral chest 06/08/2015, 10/31/2013 and 08/28/2012.  FINDINGS: There is cardiomegaly and pulmonary vascular congestion. No consolidative process or pneumothorax is identified. There may be a very small left pleural effusion. Aortic atherosclerosis is noted.  IMPRESSION: Very small left pleural effusion.  No evidence of pneumonia.  Cardiomegaly and chronic pulmonary vascular congestion.   Electronically Signed   By: Inge Rise M.D.   On: 08/31/2015 10:06         Subjective: Patient denies fevers, chills, headache, chest pain, dyspnea, nausea, vomiting, diarrhea, abdominal  pain, dysuria, hematuria   Objective: Filed Vitals:   09/05/15 0035 09/05/15 0414 09/05/15  0436 09/05/15 0825  BP: 124/68 69/50 114/67 114/60  Pulse: 91 97  85  Temp: 98.4 F (36.9 C) 98.7 F (37.1 C)  98.7 F (37.1 C)  TempSrc: Oral Oral  Oral  Resp: 18 20  20   Height:      Weight:  104.055 kg (229 lb 6.4 oz)    SpO2: 98% 99%  100%    Intake/Output Summary (Last 24 hours) at 09/05/15 0849 Last data filed at 09/05/15 0429  Gross per 24 hour  Intake    320 ml  Output   1302 ml  Net   -982 ml   Weight change: -1.95 kg (-4 lb 4.8 oz) Exam:   General:  Pt is alert, follows commands appropriately, not in acute distress  HEENT: No icterus, No thrush, No neck mass, [place]/AT  Cardiovascular: IRRR, S1/S2, no rubs, no gallops  Respiratory: bibasilar crackles, no wheezes  Abdomen: Soft/+BS, non tender, non distended, no guarding  Extremities: 1+LE edema, No lymphangitis, No petechiae, No rashes, no synovitis  Data Reviewed: Basic Metabolic Panel:  Recent Labs Lab 09/01/15 0416 09/02/15 0333 09/03/15 0226 09/04/15 0338 09/05/15 0359  NA 135 133* 132* 133* 133*  K 3.6 3.1* 4.7 3.5 3.2*  CL 93* 91* 94* 89* 89*  CO2 33* 33* 32 35* 36*  GLUCOSE 161* 92 97 114* 109*  BUN 24* 22* 24* 18 14  CREATININE 1.40* 1.37* 1.33* 1.23* 1.06*  CALCIUM 8.2* 8.0* 7.8* 7.9* 7.9*  MG  --  1.7 2.3  --   --    Liver Function Tests:  Recent Labs Lab 08/31/15 0952 09/01/15 0416 09/02/15 0333 09/03/15 0226 09/04/15 0338  AST 22 20 22  37 25  ALT 8* 7* 8* 11* 10*  ALKPHOS 76 75 70 67 66  BILITOT 1.1 1.2 1.5* 2.0* 1.1  PROT 7.6 6.8 6.9 5.9* 6.9  ALBUMIN 3.2* 2.7* 2.5* 2.3* 2.6*   No results for input(s): LIPASE, AMYLASE in the last 168 hours. No results for input(s): AMMONIA in the last 168 hours. CBC:  Recent Labs Lab 08/31/15 0952 08/31/15 2344 09/01/15 0416 09/02/15 0333 09/04/15 0338 09/05/15 0359  WBC 19.8* 20.4* 18.3* 11.8* 6.5 7.4  NEUTROABS 16.4*  --   --  8.7*  --   --   HGB 9.6* 9.5* 9.4* 9.1* 9.0* 8.4*  HCT 31.1* 29.8* 29.8* 29.0* 28.9* 28.0*  MCV  99.7 99.0 98.0 98.0 98.0 97.2  PLT 146* 117* 112* 112* 113* 129*   Cardiac Enzymes: No results for input(s): CKTOTAL, CKMB, CKMBINDEX, TROPONINI in the last 168 hours. BNP: Invalid input(s): POCBNP CBG:  Recent Labs Lab 09/04/15 0612 09/04/15 1118 09/04/15 1635 09/04/15 2106 09/05/15 0558  GLUCAP 104* 98 148* 141* 112*    Recent Results (from the past 240 hour(s))  Culture, blood (routine x 2)     Status: None (Preliminary result)   Collection Time: 08/31/15  9:40 AM  Result Value Ref Range Status   Specimen Description BLOOD RIGHT HAND  Final   Special Requests BOTTLES DRAWN AEROBIC AND ANAEROBIC 5CC  Final   Culture NO GROWTH 4 DAYS  Final   Report Status PENDING  Incomplete  Culture, blood (routine x 2)     Status: None (Preliminary result)   Collection Time: 08/31/15  9:46 AM  Result Value Ref Range Status   Specimen Description BLOOD LEFT HAND  Final   Special Requests BOTTLES DRAWN  AEROBIC AND ANAEROBIC 5CC  Final   Culture NO GROWTH 4 DAYS  Final   Report Status PENDING  Incomplete  Urine culture     Status: None   Collection Time: 08/31/15 10:35 AM  Result Value Ref Range Status   Specimen Description URINE, CATHETERIZED  Final   Special Requests NONE  Final   Culture >=100,000 COLONIES/mL ESCHERICHIA COLI  Final   Report Status 09/02/2015 FINAL  Final   Organism ID, Bacteria ESCHERICHIA COLI  Final      Susceptibility   Escherichia coli - MIC*    AMPICILLIN >=32 RESISTANT Resistant     CEFAZOLIN <=4 SENSITIVE Sensitive     CEFTRIAXONE <=1 SENSITIVE Sensitive     CIPROFLOXACIN <=0.25 SENSITIVE Sensitive     GENTAMICIN <=1 SENSITIVE Sensitive     IMIPENEM <=0.25 SENSITIVE Sensitive     NITROFURANTOIN <=16 SENSITIVE Sensitive     TRIMETH/SULFA >=320 RESISTANT Resistant     AMPICILLIN/SULBACTAM 16 INTERMEDIATE Intermediate     PIP/TAZO <=4 SENSITIVE Sensitive     * >=100,000 COLONIES/mL ESCHERICHIA COLI  MRSA PCR Screening     Status: None   Collection  Time: 08/31/15  4:49 PM  Result Value Ref Range Status   MRSA by PCR NEGATIVE NEGATIVE Final    Comment:        The GeneXpert MRSA Assay (FDA approved for NASAL specimens only), is one component of a comprehensive MRSA colonization surveillance program. It is not intended to diagnose MRSA infection nor to guide or monitor treatment for MRSA infections.      Scheduled Meds: . antiseptic oral rinse  7 mL Mouth Rinse BID  . atorvastatin  20 mg Oral Daily  . cefTRIAXone (ROCEPHIN)  IV  2 g Intravenous Q24H  . docusate sodium  100 mg Oral BID  . furosemide  60 mg Intravenous Once  . insulin aspart  0-15 Units Subcutaneous TID WC  . metoprolol tartrate  50 mg Oral BID  . pantoprazole  40 mg Oral BID AC  . warfarin  2 mg Oral ONCE-1800  . Warfarin - Pharmacist Dosing Inpatient   Does not apply q1800   Continuous Infusions:    Kodee Ravert, DO  [facility] Pager [phone]  If 7PM-7AM, please contact night-coverage [email or web address] Password [facility] 09/05/2015, 8:49 AM   LOS: 5 days

## 2015-09-06 ENCOUNTER — Inpatient Hospital Stay (HOSPITAL_COMMUNITY): Payer: Commercial Managed Care - HMO

## 2015-09-06 DIAGNOSIS — I5033 Acute on chronic diastolic (congestive) heart failure: Secondary | ICD-10-CM | POA: Insufficient documentation

## 2015-09-06 LAB — BASIC METABOLIC PANEL
Anion gap: 10 (ref 5–15)
BUN: 11 mg/dL (ref 6–20)
CHLORIDE: 90 mmol/L — AB (ref 101–111)
CO2: 35 mmol/L — ABNORMAL HIGH (ref 22–32)
Calcium: 8 mg/dL — ABNORMAL LOW (ref 8.9–10.3)
Creatinine, Ser: 1.02 mg/dL — ABNORMAL HIGH (ref 0.44–1.00)
GFR, EST AFRICAN AMERICAN: 56 mL/min — AB (ref >60)
GFR, EST NON AFRICAN AMERICAN: 48 mL/min — AB (ref >60)
Glucose, Bld: 136 mg/dL — ABNORMAL HIGH (ref 65–99)
POTASSIUM: 4.1 mmol/L (ref 3.5–5.1)
SODIUM: 135 mmol/L (ref 135–145)

## 2015-09-06 LAB — PROTIME-INR
INR: 2.81 — AB (ref 0.00–1.49)
Prothrombin Time: 29.1 seconds — ABNORMAL HIGH (ref 11.6–15.2)

## 2015-09-06 LAB — GLUCOSE, CAPILLARY
GLUCOSE-CAPILLARY: 173 mg/dL — AB (ref 65–99)
GLUCOSE-CAPILLARY: 122 mg/dL — AB (ref 65–99)
Glucose-Capillary: 101 mg/dL — ABNORMAL HIGH (ref 65–99)

## 2015-09-06 LAB — MAGNESIUM: MAGNESIUM: 1.6 mg/dL — AB (ref 1.7–2.4)

## 2015-09-06 MED ORDER — CEPHALEXIN 500 MG PO CAPS: 500.0000 mg | ORAL_CAPSULE | Freq: Four times a day (QID) | ORAL | Status: DC

## 2015-09-06 MED ORDER — CEPHALEXIN 500 MG PO CAPS
500.0000 mg | ORAL_CAPSULE | Freq: Four times a day (QID) | ORAL | Status: DC
  Administered 2015-09-06: 500 mg via ORAL
  Filled 2015-09-06: qty 1

## 2015-09-06 MED ORDER — MAGNESIUM SULFATE 2 GM/50ML IV SOLN
2.0000 g | Freq: Once | INTRAVENOUS | Status: AC
  Administered 2015-09-06: 2 g via INTRAVENOUS
  Filled 2015-09-06: qty 50

## 2015-09-06 MED ORDER — WARFARIN SODIUM 4 MG PO TABS: 2.0000 mg | ORAL_TABLET | Freq: Once | ORAL | Status: DC

## 2015-09-06 MED ORDER — METOPROLOL TARTRATE 50 MG PO TABS: 50.0000 mg | ORAL_TABLET | Freq: Two times a day (BID) | ORAL | Status: DC

## 2015-09-06 NOTE — Progress Notes (Addendum)
ANTICOAGULATION CONSULT NOTE - Follow Up Consult  Pharmacy Consult:  Coumadin Indication: atrial fibrillation  Allergies  Allergen Reactions  . Peach [Prunus Persica] Rash    AGENT: peach fuzz    Patient Measurements: Height: 5\' 4"  (162.6 cm) Weight: 225 lb 7 oz (102.259 kg) (scale A) IBW/kg (Calculated) : 54.7  Vital Signs: Temp: 98 F (36.7 C) (09/29 0739) Temp Source: Oral (09/29 0739) BP: 104/44 mmHg (09/29 0739) Pulse Rate: 82 (09/29 0739)  Labs:  Recent Labs  09/04/15 0338 09/05/15 0359 09/06/15 0314  HGB 9.0* 8.4*  --   HCT 28.9* 28.0*  --   PLT 113* 129*  --   LABPROT 28.7* 27.6* 29.1*  INR 2.75* 2.62* 2.81*  CREATININE 1.23* 1.06* 1.02*    Estimated Creatinine Clearance: 46.1 mL/min (by C-G formula based on Cr of 1.02).   Assessment: 57 YOF with history of Afib to continue on Coumadin.  INR therapeutic and no bleeding reported.  She is requiring a low dose compared to PTA and this may be due to low po intake (low vitamin K intake) and/or treatment with antibiotics.   Home coumadin dose: 2mg  daily, except 4mg  on Th/Sat  Goal of Therapy:  INR 2-3    Plan:  - Coumadin 2mg  PO today - If dc/d today would continue 2 mg dose with close f/u. - Daily PT / INR  Vincenza Hews, PharmD, BCPS 09/06/2015, 8:56 AM Pager: [phone]

## 2015-09-06 NOTE — Care Management Important Message (Signed)
Important Message  Patient Details  Name: [name] MRN: [id number] Date of Birth: [birth year]   Medicare Important Message Given:  Yes-third notification given    Delorse Lek 09/06/2015, 11:53 AM

## 2015-09-06 NOTE — Progress Notes (Signed)
Physical Therapy Treatment Patient Details Name: [name] MRN: [id number] DOB: 12/06/1926 Today's Date: 09/06/2015    History of Present Illness Patient is an 81 yo female admitted 08/31/15 with severe sepsis, confusion, Afib with RVR, CHF.    PMH:  HTN, CHF, DM, neuropathy, Afib     PT Comments    Pt admitted with above diagnosis. Pt currently with functional limitations due to balance and endurance deficits. Pt ambulated with RW with good safety overall.  Feel that pt should be safe to go home with HHPT f/u with daughter at home to assist as needed.  Pt will benefit from skilled PT to increase their independence and safety with mobility to allow discharge to the venue listed below.    Follow Up Recommendations  Home health PT;Supervision/Assistance - 24 hour, HHOT and HH aide recommended as well.       Equipment Recommendations  Hospital bed    Recommendations for Other Services       Precautions / Restrictions Precautions Precautions: Fall Precaution Comments: Watch HR Restrictions Weight Bearing Restrictions: No    Mobility  Bed Mobility Overal bed mobility: Independent                Transfers Overall transfer level: Independent               General transfer comment: Took pt a few attempts to get her feet placed in correct position but she was able to do it herself.    Ambulation/Gait Ambulation/Gait assistance: Modified independent (Device/Increase time) Ambulation Distance (Feet): 25 Feet Assistive device: Rolling walker (2 wheeled) Gait Pattern/deviations: Step-through pattern;Decreased stride length;Wide base of support   Gait velocity interpretation: Below normal speed for age/gender General Gait Details: Pt still leans on RW however pt and granddaughter state this is pts baseline status.  Demonstrates safety with RW.   Stairs            Wheelchair Mobility    Modified Rankin (Stroke Patients Only)       Balance Overall  balance assessment: Modified Independent         Standing balance support: Bilateral upper extremity supported;During functional activity Standing balance-Leahy Scale: Fair Standing balance comment: can stand statically without challenges to balance.             High level balance activites: Direction changes;Turns;Sudden stops High Level Balance Comments: supervision only    Cognition Arousal/Alertness: Awake/alert Behavior During Therapy: WFL for tasks assessed/performed Overall Cognitive Status: Within Functional Limits for tasks assessed                      Exercises      General Comments        Pertinent Vitals/Pain Pain Assessment: No/denies pain  VSS    Home Living                      Prior Function            PT Goals (current goals can now be found in the care plan section) Progress towards PT goals: Progressing toward goals    Frequency  Min 3X/week    PT Plan Discharge plan needs to be updated    Co-evaluation             End of Session Equipment Utilized During Treatment: Gait belt;Oxygen Activity Tolerance: Patient limited by fatigue Patient left: in chair;with call bell/phone within reach;with chair alarm set     Time:  5929-2446 PT Time Calculation (min) (ACUTE ONLY): 9 min  Charges:  $Gait Training: 8-22 mins                    G Codes:      [name] 21-Sep-2015, 10:17 AM [name] Acute Rehabilitation [phone] (pager)

## 2015-09-06 NOTE — Discharge Summary (Addendum)
Physician Discharge Summary  [name] EYC:[id number] DOB: 08-Jul-1926 DOA: 08/31/2015  PCP: Maximino Greenland, MD  Admit date: 08/31/2015 Discharge date: 09/06/2015  Recommendations for Outpatient Follow-up:  1. Pt will need to follow up with PCP in 2 weeks post discharge 2. 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) -due to prerenal azotemia +/- ATN -baseline Cr ~ 0.9-1.2 -improved  -serum creatinine 1.02 on day of d/c   Acute diastolic heart failure / Pulmonary HTN / Cardiomyopathy / Tricuspid valve regurgitation -Cardiology consultation appreciated -IV lasix transitioned to po torsemide 40 mg bid -moderate TR may be contributing to JVD -Echo September 2016 shows EF 55-60% -09/05/15--still appears a little hypervolemic---give another dose IV furosemide today -DRY WEIGHT 225-230 -negative 10 lbs since admission Atrial fibrillation with RVR -RVR likely due to infection plus patient's inability to take medications -Cardiology consultation appreciated -Patient did require IV amiodarone, transition to oral metoprolol -Continue Coumadin per pharmacy -CHADS-VASc= 6  Hypokalemia  -Resolved, continue to monitor BMP and replace as needed  Hypomagnesemia -resolved w/ correction   Diabetes mellitus, type II -CBG well controlled - A1c 7.1 - does not appear to be on meds at home   Chronic normocytic anemia -Baseline hemoglobin of proximal line 9-10, currently 9 -Continue to monitor CBC  Physical deconditioning/Left shoulder  pain -PT consulted, recommended SNF--pt refuses -repeat xray of left shoulder negative for any acute abnormality  Discharge Condition: stable  Disposition:  Follow-up Information    Follow up with [facility] On 09/13/2015.   Specialty:  Cardiology   Why:  11:20am. Amy Clegg Heart failure followup. Garage  0900   Contact information:   [address] [id number] [place] [phone]    home--refused SNF  Diet:heart healthy Wt Readings from Last 3 Encounters:  09/06/15 102.259 kg (225 lb 7 oz)  08/28/15 105.348 kg (232 lb 4 oz)  08/17/15 105.6 kg (232 lb 12.9 oz)    History of present illness:  81 y.o. female, with hx HTN, D-HF, diabetes mellitus, GI bleed, DCIS who presented to the ER with vomiting and confusion. The patient felt poorly, developed a fever, and began to vomit. She was unable to eat meals. When she also became confused she was brought to the ED.  In the ER the patient had a temp of 102.2, pulse rate of 123, BP of 81/58. WBC is 19.8. Initial lactic acid 2.2. Baseline creatinine 1.08 > admit (9/23) creatine 1.77. CXR showed chronic pulm edema and a small pleural effusion. Cardiology consulted, diuresing with IV lasix  With good clinical response. The patient was transitioned to oral torsemide. She was -10 pounds for the admission. The patient was initially septic upon admission.  she was started on intravenous vancomycin and Zosyn. Antibiotics for de-escalated. The patient will go home with oral cephalexin for 10 additional days to complete 14 days therapy.  Consultants: cardiology  Discharge Exam: Filed Vitals:   09/06/15 0739  BP: 104/44  Pulse: 82  Temp: 98 F (36.7 C)  Resp: 18   Filed Vitals:   09/06/15 0009 09/06/15 0427 09/06/15 0502  09/06/15 0739  BP: 111/58  124/64 104/44  Pulse: 74  91 82  Temp: 97.8 F (36.6 C)  98.2 F (36.8 C) 98 F (36.7 C)  TempSrc: Oral   Oral Oral  Resp: 19  18 18   Height:      Weight:  102.259 kg (225 lb 7 oz)    SpO2: 100%  100% 100%   General: A&O x 3, NAD, pleasant, cooperative Cardiovascular: IRRR, no rub, no gallop, no S3 Respiratory: bibasilar crackles. No wheeze. Good air movement.  Abdomen:soft, nontender, nondistended, positive bowel sounds Extremities: 1+ edema, No lymphangitis, no petechiae  Discharge Instructions      Discharge Instructions    Diet - low sodium heart healthy    Complete by:  As directed      Increase activity slowly    Complete by:  As directed             Medication List    TAKE these medications        acetaminophen 500 MG tablet  Commonly known as:  TYLENOL  Take 1,000 mg by mouth 2 (two) times daily as needed for mild pain.     atorvastatin 20 MG tablet  Commonly known as:  LIPITOR  Take 20 mg by mouth daily.     cephALEXin 500 MG capsule  Commonly known as:  KEFLEX  Take 1 capsule (500 mg total) by mouth every 6 (six) hours.     gabapentin 300 MG capsule  Commonly known as:  NEURONTIN  Take 300 mg by mouth at bedtime.     metoprolol 50 MG tablet  Commonly known as:  LOPRESSOR  Take 1 tablet (50 mg total) by mouth 2 (two) times daily.     pantoprazole 40 MG tablet  Commonly known as:  PROTONIX  Take 1 tablet (40 mg total) by mouth 2 (two) times daily before a meal.     potassium chloride SA 20 MEQ tablet  Commonly known as:  KLOR-CON M20  Take 2 tablets (40 mEq total) by mouth daily.     torsemide 20 MG tablet  Commonly known as:  DEMADEX  Take 2 tablets (40 mg total) every morning, and 1 tablet (20 mg) in the evening.     warfarin 2 MG tablet  Commonly known as:  COUMADIN  Take 2-4 mg by mouth daily. Take 2 mg daily except 4 mg on Thursday's and and Saturday's.         The results of significant diagnostics from this hospitalization (including imaging, microbiology, ancillary and laboratory) are listed below for reference.    Significant  Diagnostic Studies: Dg Chest 2 View  09/01/2015   CLINICAL DATA:  CHF.  EXAM: CHEST  2 VIEW  COMPARISON:  08/31/2015  FINDINGS: Cardiomegaly with vascular congestion. Diffuse interstitial prominence throughout the lungs again noted, stable. This could reflect chronic interstitial lung disease or edema. Favor chronic interstitial disease. No effusions. No acute bony abnormality.  IMPRESSION: Cardiomegaly with vascular congestion.  Stable diffuse interstitial prominence which could reflect chronic interstitial lung disease or edema.   Electronically Signed   By: Rolm Baptise M.D.   On: 09/01/2015 09:24   Dg Chest 2 View  08/31/2015   CLINICAL DATA:  Fever today.  The patient feels ill.  EXAM: CHEST  2 VIEW  COMPARISON:  PA and lateral chest 06/08/2015, 10/31/2013 and 08/28/2012.  FINDINGS: There is cardiomegaly and pulmonary vascular congestion. No consolidative process or pneumothorax is identified. There may be a  very small left pleural effusion. Aortic atherosclerosis is noted.  IMPRESSION: Very small left pleural effusion.  No evidence of pneumonia.  Cardiomegaly and chronic pulmonary vascular congestion.   Electronically Signed   By: Inge Rise M.D.   On: 08/31/2015 10:06   Dg Shoulder Left  09/06/2015   CLINICAL DATA:  Shoulder pain. Acute LEFT shoulder pain. History of shoulder dislocation.  EXAM: LEFT SHOULDER - 2+ VIEW  COMPARISON:  None.  FINDINGS: No acute osseous abnormality. Mild AC joint osteoarthritis. No fracture. Small calcification is present adjacent to the medial aspect of the proximal humeral metaphysis, which may represent a loose body or some heterotopic bone. There is obliquity on the scapular Y-view is an no axillary view is submitted making definitive assessment of location impossible.  IMPRESSION: No acute osseous abnormality.   Electronically Signed   By: Dereck Ligas M.D.   On: 09/06/2015 10:29     Microbiology: Recent Results (from the past 240 hour(s))  Culture,  blood (routine x 2)     Status: None   Collection Time: 08/31/15  9:40 AM  Result Value Ref Range Status   Specimen Description BLOOD RIGHT HAND  Final   Special Requests BOTTLES DRAWN AEROBIC AND ANAEROBIC 5CC  Final   Culture NO GROWTH 5 DAYS  Final   Report Status 09/05/2015 FINAL  Final  Culture, blood (routine x 2)     Status: None   Collection Time: 08/31/15  9:46 AM  Result Value Ref Range Status   Specimen Description BLOOD LEFT HAND  Final   Special Requests BOTTLES DRAWN AEROBIC AND ANAEROBIC 5CC  Final   Culture NO GROWTH 5 DAYS  Final   Report Status 09/05/2015 FINAL  Final  Urine culture     Status: None   Collection Time: 08/31/15 10:35 AM  Result Value Ref Range Status   Specimen Description URINE, CATHETERIZED  Final   Special Requests NONE  Final   Culture >=100,000 COLONIES/mL ESCHERICHIA COLI  Final   Report Status 09/02/2015 FINAL  Final   Organism ID, Bacteria ESCHERICHIA COLI  Final      Susceptibility   Escherichia coli - MIC*    AMPICILLIN >=32 RESISTANT Resistant     CEFAZOLIN <=4 SENSITIVE Sensitive     CEFTRIAXONE <=1 SENSITIVE Sensitive     CIPROFLOXACIN <=0.25 SENSITIVE Sensitive     GENTAMICIN <=1 SENSITIVE Sensitive     IMIPENEM <=0.25 SENSITIVE Sensitive     NITROFURANTOIN <=16 SENSITIVE Sensitive     TRIMETH/SULFA >=320 RESISTANT Resistant     AMPICILLIN/SULBACTAM 16 INTERMEDIATE Intermediate     PIP/TAZO <=4 SENSITIVE Sensitive     * >=100,000 COLONIES/mL ESCHERICHIA COLI  MRSA PCR Screening     Status: None   Collection Time: 08/31/15  4:49 PM  Result Value Ref Range Status   MRSA by PCR NEGATIVE NEGATIVE Final    Comment:        The GeneXpert MRSA Assay (FDA approved for NASAL specimens only), is one component of a comprehensive MRSA colonization surveillance program. It is not intended to diagnose MRSA infection nor to guide or monitor treatment for MRSA infections.      Labs: Basic Metabolic Panel:  Recent Labs Lab  09/02/15 0333 09/03/15 0226 09/04/15 0338 09/05/15 0359 09/06/15 0314  NA 133* 132* 133* 133* 135  K 3.1* 4.7 3.5 3.2* 4.1  CL 91* 94* 89* 89* 90*  CO2 33* 32 35* 36* 35*  GLUCOSE 92 97 114* 109* 136*  BUN  22* 24* 18 14 11   CREATININE 1.37* 1.33* 1.23* 1.06* 1.02*  CALCIUM 8.0* 7.8* 7.9* 7.9* 8.0*  MG 1.7 2.3  --   --  1.6*   Liver Function Tests:  Recent Labs Lab 08/31/15 0952 09/01/15 0416 09/02/15 0333 09/03/15 0226 09/04/15 0338  AST 22 20 22  37 25  ALT 8* 7* 8* 11* 10*  ALKPHOS 76 75 70 67 66  BILITOT 1.1 1.2 1.5* 2.0* 1.1  PROT 7.6 6.8 6.9 5.9* 6.9  ALBUMIN 3.2* 2.7* 2.5* 2.3* 2.6*   No results for input(s): LIPASE, AMYLASE in the last 168 hours. No results for input(s): AMMONIA in the last 168 hours. CBC:  Recent Labs Lab 08/31/15 0952 08/31/15 2344 09/01/15 0416 09/02/15 0333 09/04/15 0338 09/05/15 0359  WBC 19.8* 20.4* 18.3* 11.8* 6.5 7.4  NEUTROABS 16.4*  --   --  8.7*  --   --   HGB 9.6* 9.5* 9.4* 9.1* 9.0* 8.4*  HCT 31.1* 29.8* 29.8* 29.0* 28.9* 28.0*  MCV 99.7 99.0 98.0 98.0 98.0 97.2  PLT 146* 117* 112* 112* 113* 129*   Cardiac Enzymes: No results for input(s): CKTOTAL, CKMB, CKMBINDEX, TROPONINI in the last 168 hours. BNP: Invalid input(s): POCBNP CBG:  Recent Labs Lab 09/05/15 0558 09/05/15 1108 09/05/15 1653 09/05/15 2109 09/06/15 0603  GLUCAP 112* 120* 135* 173* 122*    Time coordinating discharge:  Greater than 30 minutes  Signed:  Serenitie Vinton, DO [facility] Pager: [phone] 09/06/2015, 11:07 AM

## 2015-09-06 NOTE — Progress Notes (Signed)
Patient Name: [name] Date of Encounter: 09/06/2015  Principal Problem:   Severe sepsis with acute organ dysfunction Active Problems:   HYPERCHOLESTEROLEMIA   Chronic atrial fibrillation- CHADs VASc =6   Mitral valve regurgitation with secondary PH   Chronic anticoagulation-Warfarin   Pulmonary hypertension, moderate to severe   Pyelonephritis   Atrial fibrillation with RVR   Subtherapeutic international normalized ratio (INR)   Acute renal failure   Palliative care encounter   Multisystem organ failure   Cardiomyopathy   Pulmonary hypertension   Tricuspid valve regurgitation   Blood poisoning   Acute pyelonephritis   Sepsis due to Escherichia coli   Hypokalemia   Hypomagnesemia   CHF (congestive heart failure)   UTI (lower urinary tract infection)   Acute renal failure superimposed on stage 3 chronic kidney disease   Primary Cardiologist: Dr. Aundra Dubin (for CHF) -- it was made clear that she will be following with Dr. Aundra Dubin and not Dr. Terrence Dupont  Patient Profile: 46F w/ PMF of chronic a-fib (on Coumadin), HTN, HLD, chronic diastolic HF, OHS, DM, GIB, DCIS, mitral regurgitation, Stage 3 CKD, and morbid obesity admitted 08/31/2015 for vomiting, confusion (acute toxic metabolic encephalopathy), fever/leukocytosis and found to have severe sepsis with multiorgan failure due to E. Coli pyelonephritis. Hospital course notable for AKI 2/2 pre-renal azotemia and ATN, hypokalemia, hypomagnesemia, and atrial fib w/ RVR  SUBJECTIVE: She says she has it feels pretty good today. Eating out quite a bit of breakfast. No significant dyspnea. Walking around a little bit, as much as she is usually able.  OBJECTIVE Filed Vitals:   09/06/15 0009 09/06/15 0427 09/06/15 0502 09/06/15 0739  BP: 111/58  124/64 104/44  Pulse: 74  91 82  Temp: 97.8 F (36.6 C)  98.2 F (36.8 C) 98 F (36.7 C)  TempSrc: Oral  Oral Oral  Resp: 19  18 18   Height:      Weight:  225 lb 7 oz (102.259 kg)     SpO2: 100%  100% 100%    Intake/Output Summary (Last 24 hours) at 09/06/15 1216 Last data filed at 09/06/15 0957  Gross per 24 hour  Intake    460 ml  Output   1278 ml  Net   -818 ml   Filed Weights   09/04/15 0400 09/05/15 0414 09/06/15 0427  Weight: 231 lb 1.6 oz (104.826 kg) 229 lb 6.4 oz (104.055 kg) 225 lb 7 oz (102.259 kg)    PHYSICAL EXAM General: Well developed, well nourished, female in no acute distress. Head: Normocephalic, atraumatic.  Neck: Supple without bruits, JVD mildly elevated - likely related to TR Lungs:  Resp regular and unlabored, decreased breath sounds at bases bilaterally, minimal rales. Heart: Irregularly irregular , S1, S2, no S3, S4, 3/6 murmur at apex; no rub. Abdomen: Soft, non-tender, non-distended no HSM. Extremities: No clubbing or cyanosis, trace edema. Distal pedal pulses are 2+ bilaterally. Neuro: Alert and oriented X 3. Moves all extremities spontaneously. Psych: Normal affect.   LABS: CBC:  Recent Labs  09/04/15 0338 09/05/15 0359  WBC 6.5 7.4  HGB 9.0* 8.4*  HCT 28.9* 28.0*  MCV 98.0 97.2  PLT 113* 129*   INR:  Recent Labs  09/06/15 0314  INR 9.62*   Basic Metabolic Panel:  Recent Labs  09/05/15 0359 09/06/15 0314  NA 133* 135  K 3.2* 4.1  CL 89* 90*  CO2 36* 35*  GLUCOSE 109* 136*  BUN 14 11  CREATININE 1.06* 1.02*  CALCIUM  7.9* 8.0*  MG  --  1.6*   Liver Function Tests:  Recent Labs  09/04/15 0338  AST 25  ALT 10*  ALKPHOS 66  BILITOT 1.1  PROT 6.9  ALBUMIN 2.6*   BNP:  B NATRIURETIC PEPTIDE  Date/Time Value Ref Range Status  08/31/2015 08:35 PM 874.5* 0.0 - 100.0 pg/mL Final  08/09/2015 11:55 AM 561.8* 0.0 - 100.0 pg/mL Final   PRO B NATRIURETIC PEPTIDE (BNP)  Date/Time Value Ref Range Status  06/08/2015 04:19 PM 370.0* 0.0 - 100.0 pg/mL Final  10/31/2013 11:28 AM 1548.0* 0 - 450 pg/mL Final   TELE: Atrial fibrillation with rate in 80's - 90's.        ECHO: 08/09/2015 Study  Conclusions - Left ventricle: The cavity size was normal. There was severe concentric hypertrophy. Systolic function was normal. The estimated ejection fraction was in the range of 55% to 60%. Wall motion was normal; there were no regional wall motion abnormalities. Doppler parameters are consistent with high ventricular filling pressure. - Mitral valve: Moderately calcified annulus. Mildly thickened, moderately calcified leaflets . - Left atrium: The atrium was mildly dilated. - Right ventricle: The cavity size was moderately dilated. Wall thickness was normal. Systolic function was moderately reduced. - Right atrium: The atrium was severely dilated. - Tricuspid valve: There was moderate regurgitation. - Pulmonary arteries: Systolic pressure was severely increased. PA peak pressure: 93 mm Hg (S).  Impressions: - Compared to the prior study, there has been no significant interval change.   Current Medications:  . antiseptic oral rinse  7 mL Mouth Rinse BID  . atorvastatin  20 mg Oral Daily  . cephALEXin  500 mg Oral 4 times per day  . docusate sodium  100 mg Oral BID  . insulin aspart  0-15 Units Subcutaneous TID WC  . metoprolol tartrate  50 mg Oral BID  . pantoprazole  40 mg Oral BID AC  . torsemide  40 mg Oral BID  . warfarin  2 mg Oral ONCE-1800  . Warfarin - Pharmacist Dosing Inpatient   Does not apply q1800      ASSESSMENT AND PLAN:  1. Chronic atrial fibrillation with RVR  - likely exacerbated by sepsis. Had previously been on metoprolol 12.5 BID and coumadin - Was on Metoprolol Tartrate 25mg  QID. Switched to 50mg  BID today. Rate has been in 80' -90's.  - INR therapeutic  2. Acute on chronic diastolic HF (with Pulm HTN) - 08/2015 echo LVEF 55-60%, no WMAs, high LA pressure, mod RV dysfunction, PASP 93. PTA was on torsemide 40mg   - dry weight documented - most likely somewhere around 228 pounds by family recollection. She is currently 225 pounds. At  this point I think were fine with putting her back on her home dose of torsemide. My recommendation was for her to take 40 of by mouth torsemide this afternoon as well as his twice a day tomorrow and then switched to 40/20 after that.  3. Pulmonary HTN  - receiving Lasix 60mg  IV BID. Will switch to PO Torsemide as outlined above.  4. Severe Sepsis with multisystem organ failure secondary to Pyelonephritis  - per admitting team  5. Acute on Chronic Renal Failure -baseline creatinine 0.9-1.2. Improved to 1.02 - now stable on diuretic dosing   Anticipate that she is stable for discharge today. We will arrange heart failure clinic follow-up for 7 days. She has a appointment already with Dr. Aundra Dubin on October 18.   Leonie Man, M.D., M.S. Interventional  Cardiologist   Pager # [phone]

## 2015-09-06 NOTE — Progress Notes (Signed)
Occupational Therapy Treatment Patient Details Name: [name] MRN: [id number] DOB: [birth year] Today's Date: 09/06/2015    History of present illness Patient is an 81 yo female admitted 08/31/15 with severe sepsis, confusion, Afib with RVR, CHF.    PMH:  HTN, CHF, DM, neuropathy, Afib    OT comments  Pt is excited about going home today. Pt tolerated session well and on 2L O2 via [place] throughout session. OT encouraged pt to engage in dressing tasks in order to increase independence. Pt refusing to attempt to thread clothing secondary to L UE pain and fearful. OT also educating pt on energy conservation techniques throughout session in preparation to go home.   Follow Up Recommendations  Home health OT;Supervision/Assistance - 24 hour    Equipment Recommendations  Tub/shower bench    Recommendations for Other Services      Precautions / Restrictions Precautions Precautions: Fall Precaution Comments: Watch HR Restrictions Weight Bearing Restrictions: No       Mobility Bed Mobility               General bed mobility comments: pt seated on 3 in 1 commode chair upon entering the room.   Transfers Overall transfer level: Needs assistance Equipment used: Rolling walker (2 wheeled) Transfers: Sit to/from [facility] Sit to Stand: Min assist Stand pivot transfers: Min assist       General transfer comment: assist with RW advancement and foot placement    Balance Overall balance assessment: Needs assistance         Standing balance support: Single extremity supported;During functional activity Standing balance-Leahy Scale: Fair Standing balance comment: reliance on RW for balance during clothing management.                    ADL Overall ADL's : Needs assistance/impaired                     Lower Body Dressing: Maximal assistance;Sit to/from stand               Functional mobility during ADLs: Rolling walker;Minimal  assistance General ADL Comments: Upon entering the room, NT present in room attempting to dress pt. Pt transitioning easily into OT intervention. Pt requiring assistance to thread B feet into underwear and pants. Min A sit > stand from 3 in 1. Pt able to pull up pants with R UE onto R hip with min A for standing balance. Assist to complete clothing management. Pt taking seated rest break secondary to fatigue. Pt ambulate 10' with Rw and min A to recliner chair.      Vision                     Perception     Praxis      Cognition   Behavior During Therapy: WFL for tasks assessed/performed Overall Cognitive Status: Within Functional Limits for tasks assessed                                    Pertinent Vitals/ Pain       Pain Assessment: No/denies pain         Frequency Min 2X/week     Progress Toward Goals  OT Goals(current goals can now be found in the care plan section)  Progress towards OT goals: Progressing toward goals     Plan Discharge plan needs to be updated  End of Session Equipment Utilized During Treatment: Rolling walker   Activity Tolerance Patient tolerated treatment well   Patient Left in chair;with call bell/phone within reach   Nurse Communication          Time: 2119-4174 OT Time Calculation (min): 14 min  Charges: OT General Charges $OT Visit: 1 Procedure OT Treatments $Self Care/Home Management : 8-22 mins  Phineas Semen, MS, OTR/L 09/06/2015, 2:06 PM

## 2015-09-07 ENCOUNTER — Other Ambulatory Visit: Payer: Self-pay | Admitting: *Deleted

## 2015-09-07 DIAGNOSIS — I270 Primary pulmonary hypertension: Secondary | ICD-10-CM | POA: Diagnosis not present

## 2015-09-07 DIAGNOSIS — E119 Type 2 diabetes mellitus without complications: Secondary | ICD-10-CM | POA: Diagnosis not present

## 2015-09-07 DIAGNOSIS — I482 Chronic atrial fibrillation: Secondary | ICD-10-CM | POA: Diagnosis not present

## 2015-09-07 DIAGNOSIS — Z9981 Dependence on supplemental oxygen: Secondary | ICD-10-CM | POA: Diagnosis not present

## 2015-09-07 DIAGNOSIS — E662 Morbid (severe) obesity with alveolar hypoventilation: Secondary | ICD-10-CM | POA: Diagnosis not present

## 2015-09-07 DIAGNOSIS — I340 Nonrheumatic mitral (valve) insufficiency: Secondary | ICD-10-CM | POA: Diagnosis not present

## 2015-09-07 DIAGNOSIS — Z9181 History of falling: Secondary | ICD-10-CM | POA: Diagnosis not present

## 2015-09-07 DIAGNOSIS — I5033 Acute on chronic diastolic (congestive) heart failure: Secondary | ICD-10-CM | POA: Diagnosis not present

## 2015-09-07 DIAGNOSIS — I10 Essential (primary) hypertension: Secondary | ICD-10-CM | POA: Diagnosis not present

## 2015-09-07 NOTE — Patient Outreach (Signed)
Member was readmitted to the hospital on 9/23, discharged yesterday, 9/29.  Initial transition of care call placed to daughter, [place].  Daughter states that the member has been doing well, and that her vitals have been "good."  She denies any concerns at this time, stating that the nurse from [facility] will be there for a visit this afternoon to resume care that was established prior to the readmission.  Daughter reports that she continues to weigh the member every day daily, and has adjusted her medications in her pill box according to the most recent medication list.  This care manager had initial home visit scheduled for this past Monday, however member was hospitalized, initial home visit rescheduled for next week.  Daughter encouraged to contact this care manager with any concerns.  Valente David, BSN, [facility]  [facility] Care Manager [phone]

## 2015-09-10 ENCOUNTER — Encounter: Payer: Self-pay | Admitting: *Deleted

## 2015-09-10 ENCOUNTER — Other Ambulatory Visit: Payer: Self-pay | Admitting: *Deleted

## 2015-09-10 DIAGNOSIS — Z9181 History of falling: Secondary | ICD-10-CM | POA: Diagnosis not present

## 2015-09-10 DIAGNOSIS — E662 Morbid (severe) obesity with alveolar hypoventilation: Secondary | ICD-10-CM | POA: Diagnosis not present

## 2015-09-10 DIAGNOSIS — I270 Primary pulmonary hypertension: Secondary | ICD-10-CM | POA: Diagnosis not present

## 2015-09-10 DIAGNOSIS — I340 Nonrheumatic mitral (valve) insufficiency: Secondary | ICD-10-CM | POA: Diagnosis not present

## 2015-09-10 DIAGNOSIS — I10 Essential (primary) hypertension: Secondary | ICD-10-CM | POA: Diagnosis not present

## 2015-09-10 DIAGNOSIS — I482 Chronic atrial fibrillation: Secondary | ICD-10-CM | POA: Diagnosis not present

## 2015-09-10 DIAGNOSIS — I5033 Acute on chronic diastolic (congestive) heart failure: Secondary | ICD-10-CM | POA: Diagnosis not present

## 2015-09-10 DIAGNOSIS — E119 Type 2 diabetes mellitus without complications: Secondary | ICD-10-CM | POA: Diagnosis not present

## 2015-09-10 DIAGNOSIS — Z9981 Dependence on supplemental oxygen: Secondary | ICD-10-CM | POA: Diagnosis not present

## 2015-09-10 NOTE — Patient Outreach (Signed)
[facility]) Care Management   09/10/2015  [name] 12/23/1925 [id number]  [name] is an 81 y.o. female  Subjective:   Member states that she is "doing better."  She reports that she does have some shortness of breath with activity.  She states that she has home health nursing and physical therapy involved in her care, and she lives at home with her daughter, who manages her care.  Objective:   Review of Systems  Constitutional: Negative.   HENT: Negative.   Eyes: Negative.   Respiratory: Positive for shortness of breath.        With activity   Cardiovascular: Positive for leg swelling.  Gastrointestinal: Negative.   Genitourinary: Negative.   Musculoskeletal: Negative.   Skin: Negative.   Neurological: Negative.   Endo/Heme/Allergies: Negative.   Psychiatric/Behavioral: Negative.     Physical Exam  Constitutional: She is oriented to person, place, and time. She appears well-developed and well-nourished.  Neck: Normal range of motion.  Cardiovascular: Normal heart sounds.   Irregular   Respiratory: Effort normal and breath sounds normal.  GI: Soft. Bowel sounds are normal.  Musculoskeletal: Normal range of motion.  Neurological: She is alert and oriented to person, place, and time.  Skin: Skin is warm and dry.   BP 118/70 mmHg  Pulse 80  Resp 20  Ht 1.626 m (5\' 4" )  Wt 227 lb (102.967 kg)  BMI 38.95 kg/m2  SpO2 97%   Current Medications:   Current Outpatient Prescriptions  Medication Sig Dispense Refill  . acetaminophen (TYLENOL) 500 MG tablet Take 1,000 mg by mouth 2 (two) times daily as needed for mild pain.     Marland Kitchen atorvastatin (LIPITOR) 20 MG tablet Take 20 mg by mouth daily.    . cephALEXin (KEFLEX) 500 MG capsule Take 1 capsule (500 mg total) by mouth every 6 (six) hours. 40 capsule 0  . gabapentin (NEURONTIN) 300 MG capsule Take 300 mg by mouth at bedtime.   3  . metoprolol (LOPRESSOR) 50 MG tablet Take 1 tablet (50  mg total) by mouth 2 (two) times daily. 60 tablet 0  . pantoprazole (PROTONIX) 40 MG tablet Take 1 tablet (40 mg total) by mouth 2 (two) times daily before a meal. (Patient taking differently: Take 40 mg by mouth 2 (two) times daily. ) 60 tablet 3  . potassium chloride SA (KLOR-CON M20) 20 MEQ tablet Take 2 tablets (40 mEq total) by mouth daily. 60 tablet 6  . torsemide (DEMADEX) 20 MG tablet Take 2 tablets (40 mg total) every morning, and 1 tablet (20 mg) in the evening. 90 tablet 6  . warfarin (COUMADIN) 2 MG tablet Take 2-4 mg by mouth daily. Take 2 mg daily except 4 mg on Thursday's and and Saturday's.     No current facility-administered medications for this visit.    Functional Status:   In your present state of health, do you have any difficulty performing the following activities: 09/10/2015 08/31/2015  Hearing? Y N  Vision? Y Y  Difficulty concentrating or making decisions? N Y  Walking or climbing stairs? Y Y  Dressing or bathing? Y Y  Doing errands, shopping? [name]  Preparing Food and eating ? Y -  Using the Toilet? N -  In the past six months, have you accidently leaked urine? Y -  Do you have problems with loss of bowel control? N -  Managing your Medications? Y -  Managing your Finances? Y -  Housekeeping  or managing your Housekeeping? Y -    Fall/Depression Screening:    PHQ 2/9 Scores 09/10/2015  PHQ - 2 Score 0   Fall Risk  09/10/2015  Falls in the past year? Yes  Number falls in past yr: 2 or more  Injury with Fall? Yes  Risk Factor Category  High Fall Risk  Risk for fall due to : History of fall(s)  Follow up Falls prevention discussed     Assessment:    Initial home visit with member, daughter present and states that the member has continued to improve since being discharged.  She reports that she has been administering the member's medications without any problems.  She denies needing any assistance with affording or understanding medications.  She monitors  the member's weight, blood pressure, heart rate, and oxygen level on a daily basis and records readings.  She is aware of when to notify the physician accoriding the heart failure instructions and heart failure zones.  Member has a follow up appointment with the heart failure clinic this Thursday and in two weeks.  She missed an appointment with her primary care physician due to being hospitalized, daughter will reschedule.  Member and daughter both denies any concerns at this time.  Contact information provided to member, encouraged to contact with any questions.  Plan:   Will continue with weekly transition of care calls next week. Routine home visit scheduled for next month.  THN CM Care Plan Problem One        Most Recent Value   Care Plan Problem One  Recent hospitalization    Role Documenting the Problem One  Care Management Coordinator   Care Plan for Problem One  Active   THN Long Term Goal (31-90 days)  Member will not be readmitted to hospital with COPD related condition within the next 31 days   THN Long Term Goal Start Date  09/07/15   Interventions for Problem One Long Term Goal  Discussed with member the importance of following discharge instructions, including follow up appointments, medications, diet, and home health involvement, to decrease the risk of readmission   THN CM Short Term Goal #1 (0-30 days)  Member will take medications as prescribed over the next 4 weeks   THN CM Short Term Goal #1 Start Date  09/07/15   Interventions for Short Term Goal #1  Discussed with member the importance of following discharge instructions, including follow up appointments, medications, diet, and home health involvement, to decrease the risk of readmission   THN CM Short Term Goal #2 (0-30 days)  Member will have follow up appointment with primary care provider and heart failure clinic within the next 4 weeks   THN CM Short Term Goal #2 Start Date  09/07/15   Interventions for Short Term  Goal #2  Discussed with member the importance of following discharge instructions, including follow up appointments, medications, diet, and home health involvement, to decrease the risk of readmission     Valente David, BSN, [place] Manager [phone]

## 2015-09-11 DIAGNOSIS — E119 Type 2 diabetes mellitus without complications: Secondary | ICD-10-CM | POA: Diagnosis not present

## 2015-09-11 DIAGNOSIS — Z9181 History of falling: Secondary | ICD-10-CM | POA: Diagnosis not present

## 2015-09-11 DIAGNOSIS — I270 Primary pulmonary hypertension: Secondary | ICD-10-CM | POA: Diagnosis not present

## 2015-09-11 DIAGNOSIS — E662 Morbid (severe) obesity with alveolar hypoventilation: Secondary | ICD-10-CM | POA: Diagnosis not present

## 2015-09-11 DIAGNOSIS — I5033 Acute on chronic diastolic (congestive) heart failure: Secondary | ICD-10-CM | POA: Diagnosis not present

## 2015-09-11 DIAGNOSIS — I10 Essential (primary) hypertension: Secondary | ICD-10-CM | POA: Diagnosis not present

## 2015-09-11 DIAGNOSIS — I482 Chronic atrial fibrillation: Secondary | ICD-10-CM | POA: Diagnosis not present

## 2015-09-11 DIAGNOSIS — Z9981 Dependence on supplemental oxygen: Secondary | ICD-10-CM | POA: Diagnosis not present

## 2015-09-11 DIAGNOSIS — I340 Nonrheumatic mitral (valve) insufficiency: Secondary | ICD-10-CM | POA: Diagnosis not present

## 2015-09-12 DIAGNOSIS — E119 Type 2 diabetes mellitus without complications: Secondary | ICD-10-CM | POA: Diagnosis not present

## 2015-09-12 DIAGNOSIS — I10 Essential (primary) hypertension: Secondary | ICD-10-CM | POA: Diagnosis not present

## 2015-09-12 DIAGNOSIS — Z9981 Dependence on supplemental oxygen: Secondary | ICD-10-CM | POA: Diagnosis not present

## 2015-09-12 DIAGNOSIS — M170 Bilateral primary osteoarthritis of knee: Secondary | ICD-10-CM | POA: Diagnosis not present

## 2015-09-12 DIAGNOSIS — I5033 Acute on chronic diastolic (congestive) heart failure: Secondary | ICD-10-CM | POA: Diagnosis not present

## 2015-09-12 DIAGNOSIS — I4891 Unspecified atrial fibrillation: Secondary | ICD-10-CM | POA: Diagnosis not present

## 2015-09-12 DIAGNOSIS — K264 Chronic or unspecified duodenal ulcer with hemorrhage: Secondary | ICD-10-CM | POA: Diagnosis not present

## 2015-09-12 DIAGNOSIS — I340 Nonrheumatic mitral (valve) insufficiency: Secondary | ICD-10-CM | POA: Diagnosis not present

## 2015-09-12 DIAGNOSIS — K922 Gastrointestinal hemorrhage, unspecified: Secondary | ICD-10-CM | POA: Diagnosis not present

## 2015-09-12 DIAGNOSIS — I482 Chronic atrial fibrillation: Secondary | ICD-10-CM | POA: Diagnosis not present

## 2015-09-12 DIAGNOSIS — E662 Morbid (severe) obesity with alveolar hypoventilation: Secondary | ICD-10-CM | POA: Diagnosis not present

## 2015-09-12 DIAGNOSIS — Z9181 History of falling: Secondary | ICD-10-CM | POA: Diagnosis not present

## 2015-09-12 DIAGNOSIS — I5043 Acute on chronic combined systolic (congestive) and diastolic (congestive) heart failure: Secondary | ICD-10-CM | POA: Diagnosis not present

## 2015-09-12 DIAGNOSIS — I270 Primary pulmonary hypertension: Secondary | ICD-10-CM | POA: Diagnosis not present

## 2015-09-13 ENCOUNTER — Ambulatory Visit (HOSPITAL_COMMUNITY)
Admit: 2015-09-13 | Discharge: 2015-09-13 | Disposition: A | Discharge status: Home / Self Care | Payer: Commercial Managed Care - HMO | Source: Ambulatory Visit | Attending: Cardiology | Admitting: Cardiology

## 2015-09-13 ENCOUNTER — Encounter (HOSPITAL_COMMUNITY): Payer: Self-pay

## 2015-09-13 VITALS — BP 110/66 | HR 84 | Wt 224.0 lb

## 2015-09-13 DIAGNOSIS — I270 Primary pulmonary hypertension: Secondary | ICD-10-CM | POA: Diagnosis not present

## 2015-09-13 DIAGNOSIS — I272 Other secondary pulmonary hypertension: Secondary | ICD-10-CM

## 2015-09-13 DIAGNOSIS — I5033 Acute on chronic diastolic (congestive) heart failure: Secondary | ICD-10-CM | POA: Diagnosis not present

## 2015-09-13 DIAGNOSIS — R42 Dizziness and giddiness: Secondary | ICD-10-CM | POA: Diagnosis not present

## 2015-09-13 DIAGNOSIS — I5032 Chronic diastolic (congestive) heart failure: Secondary | ICD-10-CM | POA: Diagnosis not present

## 2015-09-13 DIAGNOSIS — I482 Chronic atrial fibrillation, unspecified: Secondary | ICD-10-CM

## 2015-09-13 DIAGNOSIS — Z9181 History of falling: Secondary | ICD-10-CM | POA: Diagnosis not present

## 2015-09-13 DIAGNOSIS — E119 Type 2 diabetes mellitus without complications: Secondary | ICD-10-CM | POA: Diagnosis not present

## 2015-09-13 DIAGNOSIS — I2729 Other secondary pulmonary hypertension: Secondary | ICD-10-CM

## 2015-09-13 DIAGNOSIS — Z9981 Dependence on supplemental oxygen: Secondary | ICD-10-CM | POA: Diagnosis not present

## 2015-09-13 DIAGNOSIS — I110 Hypertensive heart disease with heart failure: Secondary | ICD-10-CM

## 2015-09-13 DIAGNOSIS — I340 Nonrheumatic mitral (valve) insufficiency: Secondary | ICD-10-CM | POA: Diagnosis not present

## 2015-09-13 DIAGNOSIS — I10 Essential (primary) hypertension: Secondary | ICD-10-CM | POA: Diagnosis not present

## 2015-09-13 DIAGNOSIS — E662 Morbid (severe) obesity with alveolar hypoventilation: Secondary | ICD-10-CM | POA: Diagnosis not present

## 2015-09-13 LAB — BASIC METABOLIC PANEL
Anion gap: 10 (ref 5–15)
BUN: 18 mg/dL (ref 6–20)
CHLORIDE: 85 mmol/L — AB (ref 101–111)
CO2: 43 mmol/L — ABNORMAL HIGH (ref 22–32)
Calcium: 9 mg/dL (ref 8.9–10.3)
Creatinine, Ser: 1.51 mg/dL — ABNORMAL HIGH (ref 0.44–1.00)
GFR calc Af Amer: 35 mL/min — ABNORMAL LOW (ref >60)
GFR, EST NON AFRICAN AMERICAN: 30 mL/min — AB (ref >60)
GLUCOSE: 177 mg/dL — AB (ref 65–99)
POTASSIUM: 4.3 mmol/L (ref 3.5–5.1)
Sodium: 138 mmol/L (ref 135–145)

## 2015-09-13 MED ORDER — METOPROLOL TARTRATE 25 MG PO TABS: 25.0000 mg | ORAL_TABLET | Freq: Two times a day (BID) | ORAL | Status: DC

## 2015-09-13 NOTE — Progress Notes (Signed)
Patient ID: [name], female   DOB: 06-05-1926, 81 y.o.   MRN: [id number] Patient ID: [name], female   DOB: [birth year], 81 y.o.   MRN: [id number] PCP: Dr. Doy Hutching Referring: Dr. Melvyn Novas  81 yo with chronic atrial fibrillation on coumadin, chronic diastolic CHF, and OHS presents for cardiology evaluation.  She has had several recent admissions.  In 6/16, she had syncope while sitting on the toilet.  She came to the ER.  Troponin was 0.21 and temperature was up to 101.  She was admitted and ended up having cardiac cath showing no obstructive CAD but 3+ MR.  Echo showed normal EF with grade III diastolic dysfunction, severe pulmonary hypertension, and per report only trivial MR.  She was re-admitted later in 6/16 after a fall and syncope again.  She was found to be profoundly hypoglycemic.  She is no longer on diabetes medications.    She was referred to Dr Melvyn Novas and has been on home oxygen for OHS.  She has been wheelchair-bound for the most part since 6/16.  She used to live alone prior to 6/16 but now lives with her daughter.  The only walking she does now if from her bed to the bathroom.  She is dyspneic with any exertion.  This is stable.  She has orthopnea and sleeps almost sitting up.  No chest pain.  BP has been fluctuating a lot and is very high today.  She has been wearing compression stockings.  She thinks that she has gained > 50 lbs since 6/16.  She has been coughing.    Admitted to [facility] September 1st with marked volume overload. Diuresed with IV lasix and transitioned to torsemide 40 mg mg in am and 20 mg in pm. Had RHC with elevated filling pressures in a restrictive pattern and pulmonary venous htn. Discharge weight was 232 pounds.   Today she returns for post hospital follow up with her family. Recently admitted with pyelonephritis. Completing antibiotic course. While hospitalized metoprolol was increased to 50 mg twice a day. Now reporting a couple episodes of presycope and  fatigue. Ongoing mild dyspnea with exertion with steps. Limited mobility. Uses wheel chair out of the house. Weight at home 224-226 pounds. Taking all medications. Wearing oxygen 24 hours a day.   Labs (7/16): K 3.7, creatinine 0.93, BNP 370  PMH: 1. HTN 2. Type II diabetes: Currently on no meds.  3. Syncope 4. Chronic atrial fibrillation: No history of CVA.  5. Chronic diastolic CHF: Echo (3/01) with EF 55-60%, grade III diastolic dysfunction, PA systolic pressure 76 mmHg, trivial MR.  6. Cardiolite (2/16) with EF 48%, no ischemia.  LHC (6/16) with 20% distal LAD stenosis, EF 55-60%, 3+ MR.  7. Mitral regurgitation: Unclear how significant this is.  Trivial per 6/16 echo but 3+ per 6/16 cath.  8. H/o DVT 9. DCIS s/p lumpectomy and radiation in 2011.   10. OA 11. Hyperlipidemia 12. OHS: On home oxygen.  13. Morbid obesity 14. RHC 08/09/2015 RA mean 29, RV 73/24, PA 74/33, mean 45,PCWP mean 33,Oxygen saturations:PA 49%AO 98% ,CO 5.12 /CI2.27, PVR 2.3 WU  SH: Lives in [place] with family, never smoked.   FH: No premature CAD. +CHF.   ROS: All systems reviewed and negative except as per HPI.   Current Outpatient Prescriptions  Medication Sig Dispense Refill  . acetaminophen (TYLENOL) 500 MG tablet Take 1,000 mg by mouth 2 (two) times daily as needed for mild pain.     Marland Kitchen  atorvastatin (LIPITOR) 20 MG tablet Take 20 mg by mouth daily.    . cephALEXin (KEFLEX) 500 MG capsule Take 1 capsule (500 mg total) by mouth every 6 (six) hours. 40 capsule 0  . gabapentin (NEURONTIN) 300 MG capsule Take 300 mg by mouth at bedtime.   3  . metoprolol (LOPRESSOR) 50 MG tablet Take 1 tablet (50 mg total) by mouth 2 (two) times daily. 60 tablet 0  . pantoprazole (PROTONIX) 40 MG tablet Take 1 tablet (40 mg total) by mouth 2 (two) times daily before a meal. (Patient taking differently: Take 40 mg by mouth 2 (two) times daily. ) 60 tablet 3  . potassium chloride SA (KLOR-CON M20) 20 MEQ tablet Take 2  tablets (40 mEq total) by mouth daily. 60 tablet 6  . torsemide (DEMADEX) 20 MG tablet Take 2 tablets (40 mg total) every morning, and 1 tablet (20 mg) in the evening. 90 tablet 6  . warfarin (COUMADIN) 2 MG tablet Take 2-4 mg by mouth daily. Take 2 mg daily except 4 mg on Thursday's and and Saturday's.     No current facility-administered medications for this encounter.   BP 110/66 mmHg  Pulse 84  Wt 224 lb (101.606 kg)  SpO2 100%  General: NAD, obese Arrived in wheel chair . Daughter present .  Neck: JVP 6-7cm, no thyromegaly or thyroid nodule.  Lungs: Clear on 3 liters oxygen.  CV: Nondisplaced PMI.  Heart regular S1/S2, no S3/S4, 3/6 HSM at apex.  No lower extremity edema. No carotid bruit.  Normal pedal pulses.  Abdomen: Soft, nontender, no hepatosplenomegaly, no distention.  Skin: Intact without lesions or rashes.  Neurologic: Alert and oriented x 3.  Psych: Normal affect. Extremities: No clubbing or cyanosis. R and LLE compression stockings in place. HEENT: Normal.   Assessment/Plan: 1. Chronic diastolic CHF:  NYHA III. Volume status stable. Continue torsemide 40 mg twice a day.   Reinforced daily weights, low salt food choices, and limiting fluid intake to < 2 liters per day.  Check BMET today. 2. HTN: Stable.   3. Pulmonary hypertension: Likely pulmonary venous hypertension that  improved with diuresis. Continue continuous oxygen.  4. Mitral regurgitation: ECHO 08/2015 with trivial MR  5. Atrial fibrillation: Chronic. Continue rate control/anticoagulation strategy. Cut back metolprolol to 25 mg twice a day due to fatigue and presyncope.     6. H/O Pyelonephritis: Completing antibiotic course.  7. Presyncope: Cut back metoprolol to 25 mg twice a day. Place 30 day event monitor   Follow up in a few weeks.  CLEGG,AMY NP-C  09/13/2015

## 2015-09-13 NOTE — Patient Instructions (Signed)
Decrease Metoprolol 25 mg twice a day  30 day event monitor  Follow up as scheduled.   Do the following things EVERYDAY: 1. Weigh yourself in the morning before breakfast. Write it down and keep it in a log. 2. Take your medicines as prescribed 3. Eat low salt foods-Limit salt (sodium) to 2000 mg per day.  4. Stay as active as you can everyday 5. Limit all fluids for the day to less than 2 liters

## 2015-09-14 ENCOUNTER — Ambulatory Visit (INDEPENDENT_AMBULATORY_CARE_PROVIDER_SITE_OTHER): Payer: Commercial Managed Care - HMO

## 2015-09-14 ENCOUNTER — Other Ambulatory Visit (HOSPITAL_COMMUNITY): Payer: Self-pay | Admitting: Adult Health

## 2015-09-14 DIAGNOSIS — I5033 Acute on chronic diastolic (congestive) heart failure: Secondary | ICD-10-CM | POA: Diagnosis not present

## 2015-09-14 DIAGNOSIS — E119 Type 2 diabetes mellitus without complications: Secondary | ICD-10-CM | POA: Diagnosis not present

## 2015-09-14 DIAGNOSIS — I482 Chronic atrial fibrillation, unspecified: Secondary | ICD-10-CM

## 2015-09-14 DIAGNOSIS — R42 Dizziness and giddiness: Secondary | ICD-10-CM | POA: Diagnosis not present

## 2015-09-14 DIAGNOSIS — I10 Essential (primary) hypertension: Secondary | ICD-10-CM | POA: Diagnosis not present

## 2015-09-14 DIAGNOSIS — Z9181 History of falling: Secondary | ICD-10-CM | POA: Diagnosis not present

## 2015-09-14 DIAGNOSIS — I270 Primary pulmonary hypertension: Secondary | ICD-10-CM | POA: Diagnosis not present

## 2015-09-14 DIAGNOSIS — E662 Morbid (severe) obesity with alveolar hypoventilation: Secondary | ICD-10-CM | POA: Diagnosis not present

## 2015-09-14 DIAGNOSIS — Z9981 Dependence on supplemental oxygen: Secondary | ICD-10-CM | POA: Diagnosis not present

## 2015-09-14 DIAGNOSIS — I340 Nonrheumatic mitral (valve) insufficiency: Secondary | ICD-10-CM | POA: Diagnosis not present

## 2015-09-17 DIAGNOSIS — I10 Essential (primary) hypertension: Secondary | ICD-10-CM | POA: Diagnosis not present

## 2015-09-17 DIAGNOSIS — I482 Chronic atrial fibrillation: Secondary | ICD-10-CM | POA: Diagnosis not present

## 2015-09-17 DIAGNOSIS — Z9181 History of falling: Secondary | ICD-10-CM | POA: Diagnosis not present

## 2015-09-17 DIAGNOSIS — E662 Morbid (severe) obesity with alveolar hypoventilation: Secondary | ICD-10-CM | POA: Diagnosis not present

## 2015-09-17 DIAGNOSIS — I5033 Acute on chronic diastolic (congestive) heart failure: Secondary | ICD-10-CM | POA: Diagnosis not present

## 2015-09-17 DIAGNOSIS — Z9981 Dependence on supplemental oxygen: Secondary | ICD-10-CM | POA: Diagnosis not present

## 2015-09-17 DIAGNOSIS — I340 Nonrheumatic mitral (valve) insufficiency: Secondary | ICD-10-CM | POA: Diagnosis not present

## 2015-09-17 DIAGNOSIS — E119 Type 2 diabetes mellitus without complications: Secondary | ICD-10-CM | POA: Diagnosis not present

## 2015-09-17 DIAGNOSIS — I270 Primary pulmonary hypertension: Secondary | ICD-10-CM | POA: Diagnosis not present

## 2015-09-18 DIAGNOSIS — E119 Type 2 diabetes mellitus without complications: Secondary | ICD-10-CM | POA: Diagnosis not present

## 2015-09-18 DIAGNOSIS — I270 Primary pulmonary hypertension: Secondary | ICD-10-CM | POA: Diagnosis not present

## 2015-09-18 DIAGNOSIS — Z9181 History of falling: Secondary | ICD-10-CM | POA: Diagnosis not present

## 2015-09-18 DIAGNOSIS — I340 Nonrheumatic mitral (valve) insufficiency: Secondary | ICD-10-CM | POA: Diagnosis not present

## 2015-09-18 DIAGNOSIS — I482 Chronic atrial fibrillation: Secondary | ICD-10-CM | POA: Diagnosis not present

## 2015-09-18 DIAGNOSIS — I5033 Acute on chronic diastolic (congestive) heart failure: Secondary | ICD-10-CM | POA: Diagnosis not present

## 2015-09-18 DIAGNOSIS — I10 Essential (primary) hypertension: Secondary | ICD-10-CM | POA: Diagnosis not present

## 2015-09-18 DIAGNOSIS — E662 Morbid (severe) obesity with alveolar hypoventilation: Secondary | ICD-10-CM | POA: Diagnosis not present

## 2015-09-18 DIAGNOSIS — Z9981 Dependence on supplemental oxygen: Secondary | ICD-10-CM | POA: Diagnosis not present

## 2015-09-19 ENCOUNTER — Ambulatory Visit (HOSPITAL_COMMUNITY)
Admission: RE | Admit: 2015-09-19 | Discharge: 2015-09-19 | Disposition: A | Discharge status: Home / Self Care | Payer: Commercial Managed Care - HMO | Source: Ambulatory Visit | Attending: Cardiology | Admitting: Cardiology

## 2015-09-19 DIAGNOSIS — I270 Primary pulmonary hypertension: Secondary | ICD-10-CM | POA: Diagnosis not present

## 2015-09-19 DIAGNOSIS — I5033 Acute on chronic diastolic (congestive) heart failure: Secondary | ICD-10-CM | POA: Diagnosis not present

## 2015-09-19 DIAGNOSIS — I5022 Chronic systolic (congestive) heart failure: Secondary | ICD-10-CM

## 2015-09-19 DIAGNOSIS — I5032 Chronic diastolic (congestive) heart failure: Secondary | ICD-10-CM | POA: Insufficient documentation

## 2015-09-19 DIAGNOSIS — Z9181 History of falling: Secondary | ICD-10-CM | POA: Diagnosis not present

## 2015-09-19 DIAGNOSIS — Z9981 Dependence on supplemental oxygen: Secondary | ICD-10-CM | POA: Diagnosis not present

## 2015-09-19 DIAGNOSIS — M170 Bilateral primary osteoarthritis of knee: Secondary | ICD-10-CM | POA: Diagnosis not present

## 2015-09-19 DIAGNOSIS — I10 Essential (primary) hypertension: Secondary | ICD-10-CM | POA: Diagnosis not present

## 2015-09-19 DIAGNOSIS — I482 Chronic atrial fibrillation: Secondary | ICD-10-CM | POA: Diagnosis not present

## 2015-09-19 DIAGNOSIS — E119 Type 2 diabetes mellitus without complications: Secondary | ICD-10-CM | POA: Diagnosis not present

## 2015-09-19 DIAGNOSIS — E662 Morbid (severe) obesity with alveolar hypoventilation: Secondary | ICD-10-CM | POA: Diagnosis not present

## 2015-09-19 DIAGNOSIS — I340 Nonrheumatic mitral (valve) insufficiency: Secondary | ICD-10-CM | POA: Diagnosis not present

## 2015-09-19 LAB — BASIC METABOLIC PANEL
Anion gap: 9 (ref 5–15)
BUN: 15 mg/dL (ref 6–20)
CO2: 41 mmol/L — ABNORMAL HIGH (ref 22–32)
CREATININE: 1.41 mg/dL — AB (ref 0.44–1.00)
Calcium: 8.7 mg/dL — ABNORMAL LOW (ref 8.9–10.3)
Chloride: 85 mmol/L — ABNORMAL LOW (ref 101–111)
GFR calc Af Amer: 38 mL/min — ABNORMAL LOW (ref >60)
GFR, EST NON AFRICAN AMERICAN: 33 mL/min — AB (ref >60)
Glucose, Bld: 130 mg/dL — ABNORMAL HIGH (ref 65–99)
Potassium: 4.3 mmol/L (ref 3.5–5.1)
SODIUM: 135 mmol/L (ref 135–145)

## 2015-09-20 DIAGNOSIS — I340 Nonrheumatic mitral (valve) insufficiency: Secondary | ICD-10-CM | POA: Diagnosis not present

## 2015-09-20 DIAGNOSIS — E119 Type 2 diabetes mellitus without complications: Secondary | ICD-10-CM | POA: Diagnosis not present

## 2015-09-20 DIAGNOSIS — Z9981 Dependence on supplemental oxygen: Secondary | ICD-10-CM | POA: Diagnosis not present

## 2015-09-20 DIAGNOSIS — E662 Morbid (severe) obesity with alveolar hypoventilation: Secondary | ICD-10-CM | POA: Diagnosis not present

## 2015-09-20 DIAGNOSIS — I270 Primary pulmonary hypertension: Secondary | ICD-10-CM | POA: Diagnosis not present

## 2015-09-20 DIAGNOSIS — I10 Essential (primary) hypertension: Secondary | ICD-10-CM | POA: Diagnosis not present

## 2015-09-20 DIAGNOSIS — I482 Chronic atrial fibrillation: Secondary | ICD-10-CM | POA: Diagnosis not present

## 2015-09-20 DIAGNOSIS — Z9181 History of falling: Secondary | ICD-10-CM | POA: Diagnosis not present

## 2015-09-20 DIAGNOSIS — I5033 Acute on chronic diastolic (congestive) heart failure: Secondary | ICD-10-CM | POA: Diagnosis not present

## 2015-09-21 ENCOUNTER — Other Ambulatory Visit: Payer: Self-pay | Admitting: *Deleted

## 2015-09-21 DIAGNOSIS — I482 Chronic atrial fibrillation: Secondary | ICD-10-CM | POA: Diagnosis not present

## 2015-09-21 DIAGNOSIS — E662 Morbid (severe) obesity with alveolar hypoventilation: Secondary | ICD-10-CM | POA: Diagnosis not present

## 2015-09-21 DIAGNOSIS — I5033 Acute on chronic diastolic (congestive) heart failure: Secondary | ICD-10-CM | POA: Diagnosis not present

## 2015-09-21 DIAGNOSIS — Z9181 History of falling: Secondary | ICD-10-CM | POA: Diagnosis not present

## 2015-09-21 DIAGNOSIS — I10 Essential (primary) hypertension: Secondary | ICD-10-CM | POA: Diagnosis not present

## 2015-09-21 DIAGNOSIS — I340 Nonrheumatic mitral (valve) insufficiency: Secondary | ICD-10-CM | POA: Diagnosis not present

## 2015-09-21 DIAGNOSIS — I270 Primary pulmonary hypertension: Secondary | ICD-10-CM | POA: Diagnosis not present

## 2015-09-21 DIAGNOSIS — E119 Type 2 diabetes mellitus without complications: Secondary | ICD-10-CM | POA: Diagnosis not present

## 2015-09-21 DIAGNOSIS — Z9981 Dependence on supplemental oxygen: Secondary | ICD-10-CM | POA: Diagnosis not present

## 2015-09-21 NOTE — Patient Outreach (Signed)
[facility]) Care Management  09/21/2015  [name] [birth year] [id number]  Covering for [facility] Ongoing transition of care outreach  RN spoke with pt today and introduced the purpose of today's call. Pt states she is doing well and weighing everyday. When inquiry was made pt reports she weight 226 lbs today and yesterday. States she has weighed this weight over the last month with no problems. Denies any swelling to upper and lower extremities and experiences only some SOB upon exertion however this is her norm "not very much". Confirmed pt is attending all her medical appointments and taking all her prescribed medication with no delays. No other issues or request at this time as pt receptive to future outreach transition of care call by [facility]. Will report off to [facility] as noted above.  Raina Mina, RN Care Management Coordinator [facility] Main Office [phone]

## 2015-09-24 DIAGNOSIS — E119 Type 2 diabetes mellitus without complications: Secondary | ICD-10-CM | POA: Diagnosis not present

## 2015-09-24 DIAGNOSIS — I10 Essential (primary) hypertension: Secondary | ICD-10-CM | POA: Diagnosis not present

## 2015-09-24 DIAGNOSIS — I340 Nonrheumatic mitral (valve) insufficiency: Secondary | ICD-10-CM | POA: Diagnosis not present

## 2015-09-24 DIAGNOSIS — E662 Morbid (severe) obesity with alveolar hypoventilation: Secondary | ICD-10-CM | POA: Diagnosis not present

## 2015-09-24 DIAGNOSIS — I270 Primary pulmonary hypertension: Secondary | ICD-10-CM | POA: Diagnosis not present

## 2015-09-24 DIAGNOSIS — Z9181 History of falling: Secondary | ICD-10-CM | POA: Diagnosis not present

## 2015-09-24 DIAGNOSIS — Z9981 Dependence on supplemental oxygen: Secondary | ICD-10-CM | POA: Diagnosis not present

## 2015-09-24 DIAGNOSIS — I5033 Acute on chronic diastolic (congestive) heart failure: Secondary | ICD-10-CM | POA: Diagnosis not present

## 2015-09-24 DIAGNOSIS — I482 Chronic atrial fibrillation: Secondary | ICD-10-CM | POA: Diagnosis not present

## 2015-09-25 ENCOUNTER — Ambulatory Visit (HOSPITAL_COMMUNITY)
Admission: RE | Admit: 2015-09-25 | Discharge: 2015-09-25 | Disposition: A | Discharge status: Home / Self Care | Payer: Commercial Managed Care - HMO | Source: Ambulatory Visit | Attending: Cardiology | Admitting: Cardiology

## 2015-09-25 VITALS — BP 112/64 | Wt 232.8 lb

## 2015-09-25 DIAGNOSIS — R42 Dizziness and giddiness: Secondary | ICD-10-CM | POA: Insufficient documentation

## 2015-09-25 DIAGNOSIS — I5032 Chronic diastolic (congestive) heart failure: Secondary | ICD-10-CM | POA: Insufficient documentation

## 2015-09-25 DIAGNOSIS — R011 Cardiac murmur, unspecified: Secondary | ICD-10-CM | POA: Insufficient documentation

## 2015-09-25 DIAGNOSIS — Z7901 Long term (current) use of anticoagulants: Secondary | ICD-10-CM | POA: Insufficient documentation

## 2015-09-25 DIAGNOSIS — I272 Other secondary pulmonary hypertension: Secondary | ICD-10-CM | POA: Diagnosis not present

## 2015-09-25 DIAGNOSIS — I482 Chronic atrial fibrillation, unspecified: Secondary | ICD-10-CM

## 2015-09-25 DIAGNOSIS — I2729 Other secondary pulmonary hypertension: Secondary | ICD-10-CM

## 2015-09-25 DIAGNOSIS — I110 Hypertensive heart disease with heart failure: Secondary | ICD-10-CM | POA: Diagnosis not present

## 2015-09-25 MED ORDER — TORSEMIDE 20 MG PO TABS: 40.0000 mg | ORAL_TABLET | Freq: Two times a day (BID) | ORAL | Status: DC

## 2015-09-25 NOTE — Patient Instructions (Signed)
INCREASE Torsemide to 40 mg (2 tabs) twice a day  Labs needed in 2 weeks  Your physician recommends that you schedule a follow-up appointment in: 3 weeks with Dr.McLean  Do the following things EVERYDAY: 1) Weigh yourself in the morning before breakfast. Write it down and keep it in a log. 2) Take your medicines as prescribed 3) Eat low salt foods-Limit salt (sodium) to 2000 mg per day.  4) Stay as active as you can everyday 5) Limit all fluids for the day to less than 2 liters 6)

## 2015-09-26 DIAGNOSIS — I340 Nonrheumatic mitral (valve) insufficiency: Secondary | ICD-10-CM | POA: Diagnosis not present

## 2015-09-26 DIAGNOSIS — E662 Morbid (severe) obesity with alveolar hypoventilation: Secondary | ICD-10-CM | POA: Diagnosis not present

## 2015-09-26 DIAGNOSIS — I10 Essential (primary) hypertension: Secondary | ICD-10-CM | POA: Diagnosis not present

## 2015-09-26 DIAGNOSIS — E119 Type 2 diabetes mellitus without complications: Secondary | ICD-10-CM | POA: Diagnosis not present

## 2015-09-26 DIAGNOSIS — I482 Chronic atrial fibrillation: Secondary | ICD-10-CM | POA: Diagnosis not present

## 2015-09-26 DIAGNOSIS — Z9981 Dependence on supplemental oxygen: Secondary | ICD-10-CM | POA: Diagnosis not present

## 2015-09-26 DIAGNOSIS — Z9181 History of falling: Secondary | ICD-10-CM | POA: Diagnosis not present

## 2015-09-26 DIAGNOSIS — I5033 Acute on chronic diastolic (congestive) heart failure: Secondary | ICD-10-CM | POA: Diagnosis not present

## 2015-09-26 DIAGNOSIS — I270 Primary pulmonary hypertension: Secondary | ICD-10-CM | POA: Diagnosis not present

## 2015-09-26 NOTE — Progress Notes (Signed)
Patient ID: [name], female   DOB: Jul 12, 1926, 81 y.o.   MRN: [id number] PCP: Dr. Doy Hutching Referring: Dr. Melvyn Novas Cardiology: Dr. Aundra Dubin  81 yo with chronic atrial fibrillation on coumadin, chronic diastolic CHF, and OHS presents for cardiology evaluation.  She has had several recent admissions.  In 6/16, she had syncope while sitting on the toilet.  She came to the ER.  Troponin was 0.21 and temperature was up to 101.  She was admitted and ended up having cardiac cath showing no obstructive CAD but 3+ MR.  Echo showed normal EF with grade III diastolic dysfunction, severe pulmonary hypertension, and per report only trivial MR.  She was re-admitted later in 6/16 after a fall and syncope again.  She was found to be profoundly hypoglycemic.  She is no longer on diabetes medications.    She was referred to Dr Melvyn Novas and has been on home oxygen for OHS.  She has been wheelchair-bound for the most part since 6/16.  She used to live alone prior to 6/16 but now lives with her daughter.  Admitted to [facility] 9/16 with marked volume overload. Diuresed with IV lasix and transitioned to torsemide 40 mg mg in am and 20 mg in pm. Had RHC with elevated filling pressures in a restrictive pattern and pulmonary venous htn. Discharge weight was 232 pounds.   Today she returns for post hospital follow up with her family.  Weight is up 8 lbs by our scales but has been stable at home. Still with limited mobility, dyspnea walking around her house but doing more walking now.  Wears oxygen continuously.  At last appointment, she reported "dizzy spells" and is wearing a 30 day monitor.  These have improved after decreasing metoprolol at last appointment.  For about a week, orthopnea has been worse.  She has not been sleeping in her bed (using chair).    Labs (7/16): K 3.7, creatinine 0.93, BNP 370 Labs (9/16): hgb 8.4 Labs (10/16): K 4.3, creatinine 1.41  PMH: 1. HTN 2. Type II diabetes: Currently on no meds.  3. Syncope 4.  Chronic atrial fibrillation: No history of CVA.  5. Chronic diastolic CHF: Echo (6/60) with EF 55-60%, grade III diastolic dysfunction, PA systolic pressure 76 mmHg, trivial MR.  6. Cardiolite (2/16) with EF 48%, no ischemia.  LHC (6/16) with 20% distal LAD stenosis, EF 55-60%, 3+ MR.  7. Mitral regurgitation: Unclear how significant this is.  Trivial per 6/16 echo but 3+ per 6/16 cath.  8. H/o DVT 9. DCIS s/p lumpectomy and radiation in 2011.   10. OA 11. Hyperlipidemia 12. OHS: On home oxygen.  13. Morbid obesity 14. RHC 08/09/2015 RA mean 29, RV 73/24, PA 74/33, mean 45,PCWP mean 33,Oxygen saturations:PA 49%AO 98% ,CO 5.12 /CI2.27, PVR 2.3 WU  SH: Lives in [place] with family, never smoked.   FH: No premature CAD. +CHF.   ROS: All systems reviewed and negative except as per HPI.   Current Outpatient Prescriptions  Medication Sig Dispense Refill  . acetaminophen (TYLENOL) 500 MG tablet Take 1,000 mg by mouth 2 (two) times daily as needed for mild pain.     Marland Kitchen atorvastatin (LIPITOR) 20 MG tablet Take 20 mg by mouth daily.    Marland Kitchen gabapentin (NEURONTIN) 300 MG capsule Take 300 mg by mouth at bedtime.   3  . metoprolol tartrate (LOPRESSOR) 25 MG tablet Take 1 tablet (25 mg total) by mouth 2 (two) times daily. 180 tablet 3  . pantoprazole (PROTONIX) 40  MG tablet Take 1 tablet (40 mg total) by mouth 2 (two) times daily before a meal. (Patient taking differently: Take 40 mg by mouth 2 (two) times daily. ) 60 tablet 3  . potassium chloride SA (KLOR-CON M20) 20 MEQ tablet Take 2 tablets (40 mEq total) by mouth daily. 60 tablet 6  . torsemide (DEMADEX) 20 MG tablet Take 2 tablets (40 mg total) by mouth 2 (two) times daily. 120 tablet 6  . warfarin (COUMADIN) 2 MG tablet Take 2-4 mg by mouth daily. Take 2 mg daily except 4 mg on Thursday's and and Saturday's.     No current facility-administered medications for this encounter.   BP 112/64 mmHg  Wt 232 lb 12.8 oz (105.597 kg)  General: NAD,  obese Arrived in wheel chair . Daughter present .  Neck: JVP 8-9 cm, no thyromegaly or thyroid nodule.  Lungs: Crackles at bases bilaterally  CV: Nondisplaced PMI.  Heart irregular S1/S2, no S3/S4, 2/6 HSM at apex.  No lower extremity edema. No carotid bruit.  Normal pedal pulses.  Abdomen: Soft, nontender, no hepatosplenomegaly, no distention.  Skin: Intact without lesions or rashes.  Neurologic: Alert and oriented x 3.  Psych: Normal affect. Extremities: No clubbing or cyanosis. R and LLE compression stockings in place with 1+ ankle edema. HEENT: Normal.   Assessment/Plan: 1. Chronic diastolic CHF: NYHA III. She appears to be starting to re-develop volume overload.  Weight is up.   - Increase torsemide to 40 mg bid today.  BMET/BNP in 1 week.  2. HTN: Stable.   3. Pulmonary hypertension: Likely pulmonary venous hypertension that improved with diuresis. Continue continuous oxygen.  4. Murmur: Moderate TR on echo.   5. Atrial fibrillation: Chronic. Continue rate control/anticoagulation strategy. Remains on warfarin with stable anemia. 6. "Dizzy spells:" She is wearing 30 day monitor. These are improved.   Loralie Champagne 09/26/2015

## 2015-09-27 ENCOUNTER — Other Ambulatory Visit: Payer: Self-pay | Admitting: *Deleted

## 2015-09-27 DIAGNOSIS — I340 Nonrheumatic mitral (valve) insufficiency: Secondary | ICD-10-CM | POA: Diagnosis not present

## 2015-09-27 DIAGNOSIS — Z9181 History of falling: Secondary | ICD-10-CM | POA: Diagnosis not present

## 2015-09-27 DIAGNOSIS — I5033 Acute on chronic diastolic (congestive) heart failure: Secondary | ICD-10-CM | POA: Diagnosis not present

## 2015-09-27 DIAGNOSIS — E119 Type 2 diabetes mellitus without complications: Secondary | ICD-10-CM | POA: Diagnosis not present

## 2015-09-27 DIAGNOSIS — I482 Chronic atrial fibrillation: Secondary | ICD-10-CM | POA: Diagnosis not present

## 2015-09-27 DIAGNOSIS — Z9981 Dependence on supplemental oxygen: Secondary | ICD-10-CM | POA: Diagnosis not present

## 2015-09-27 DIAGNOSIS — I270 Primary pulmonary hypertension: Secondary | ICD-10-CM | POA: Diagnosis not present

## 2015-09-27 DIAGNOSIS — E662 Morbid (severe) obesity with alveolar hypoventilation: Secondary | ICD-10-CM | POA: Diagnosis not present

## 2015-09-27 DIAGNOSIS — I10 Essential (primary) hypertension: Secondary | ICD-10-CM | POA: Diagnosis not present

## 2015-09-27 NOTE — Patient Outreach (Signed)
Weekly transition of care call placed to [facility] daughter/caregiver.  Daughter states that the member has been "progressing well."  She does state that the member has gained a couple pounds and has not been able to sleep in her bed (shes been sleeping in her recliner).  Daughter states that the heart failure clinic physicians has increased her "fluid pill" and the member "seem to be doing better."  Member's daughter denies any concerns at this time, denies scheduling a visit with the member's primary care physician, states that when the member's visits with the heart failure clinic decrease she will make an appointment with the PCP.  Encouraged to contact this care manager with any questions.  Will continue with weekly transition of care calls next week.  Valente David, BSN, [place] Management  [facility] Care Manager [phone]

## 2015-09-28 DIAGNOSIS — I340 Nonrheumatic mitral (valve) insufficiency: Secondary | ICD-10-CM | POA: Diagnosis not present

## 2015-09-28 DIAGNOSIS — E119 Type 2 diabetes mellitus without complications: Secondary | ICD-10-CM | POA: Diagnosis not present

## 2015-09-28 DIAGNOSIS — Z9181 History of falling: Secondary | ICD-10-CM | POA: Diagnosis not present

## 2015-09-28 DIAGNOSIS — I5033 Acute on chronic diastolic (congestive) heart failure: Secondary | ICD-10-CM | POA: Diagnosis not present

## 2015-09-28 DIAGNOSIS — Z9981 Dependence on supplemental oxygen: Secondary | ICD-10-CM | POA: Diagnosis not present

## 2015-09-28 DIAGNOSIS — I482 Chronic atrial fibrillation: Secondary | ICD-10-CM | POA: Diagnosis not present

## 2015-09-28 DIAGNOSIS — M170 Bilateral primary osteoarthritis of knee: Secondary | ICD-10-CM | POA: Diagnosis not present

## 2015-09-28 DIAGNOSIS — I270 Primary pulmonary hypertension: Secondary | ICD-10-CM | POA: Diagnosis not present

## 2015-09-28 DIAGNOSIS — I10 Essential (primary) hypertension: Secondary | ICD-10-CM | POA: Diagnosis not present

## 2015-09-28 DIAGNOSIS — E662 Morbid (severe) obesity with alveolar hypoventilation: Secondary | ICD-10-CM | POA: Diagnosis not present

## 2015-10-01 DIAGNOSIS — I340 Nonrheumatic mitral (valve) insufficiency: Secondary | ICD-10-CM | POA: Diagnosis not present

## 2015-10-01 DIAGNOSIS — I10 Essential (primary) hypertension: Secondary | ICD-10-CM | POA: Diagnosis not present

## 2015-10-01 DIAGNOSIS — I482 Chronic atrial fibrillation: Secondary | ICD-10-CM | POA: Diagnosis not present

## 2015-10-01 DIAGNOSIS — Z9981 Dependence on supplemental oxygen: Secondary | ICD-10-CM | POA: Diagnosis not present

## 2015-10-01 DIAGNOSIS — I270 Primary pulmonary hypertension: Secondary | ICD-10-CM | POA: Diagnosis not present

## 2015-10-01 DIAGNOSIS — E119 Type 2 diabetes mellitus without complications: Secondary | ICD-10-CM | POA: Diagnosis not present

## 2015-10-01 DIAGNOSIS — Z9181 History of falling: Secondary | ICD-10-CM | POA: Diagnosis not present

## 2015-10-01 DIAGNOSIS — E662 Morbid (severe) obesity with alveolar hypoventilation: Secondary | ICD-10-CM | POA: Diagnosis not present

## 2015-10-01 DIAGNOSIS — I5033 Acute on chronic diastolic (congestive) heart failure: Secondary | ICD-10-CM | POA: Diagnosis not present

## 2015-10-02 DIAGNOSIS — I340 Nonrheumatic mitral (valve) insufficiency: Secondary | ICD-10-CM | POA: Diagnosis not present

## 2015-10-02 DIAGNOSIS — I482 Chronic atrial fibrillation: Secondary | ICD-10-CM | POA: Diagnosis not present

## 2015-10-02 DIAGNOSIS — Z9981 Dependence on supplemental oxygen: Secondary | ICD-10-CM | POA: Diagnosis not present

## 2015-10-02 DIAGNOSIS — I270 Primary pulmonary hypertension: Secondary | ICD-10-CM | POA: Diagnosis not present

## 2015-10-02 DIAGNOSIS — E119 Type 2 diabetes mellitus without complications: Secondary | ICD-10-CM | POA: Diagnosis not present

## 2015-10-02 DIAGNOSIS — I10 Essential (primary) hypertension: Secondary | ICD-10-CM | POA: Diagnosis not present

## 2015-10-02 DIAGNOSIS — E662 Morbid (severe) obesity with alveolar hypoventilation: Secondary | ICD-10-CM | POA: Diagnosis not present

## 2015-10-02 DIAGNOSIS — I5033 Acute on chronic diastolic (congestive) heart failure: Secondary | ICD-10-CM | POA: Diagnosis not present

## 2015-10-02 DIAGNOSIS — Z9181 History of falling: Secondary | ICD-10-CM | POA: Diagnosis not present

## 2015-10-03 DIAGNOSIS — I482 Chronic atrial fibrillation: Secondary | ICD-10-CM | POA: Diagnosis not present

## 2015-10-03 DIAGNOSIS — I5033 Acute on chronic diastolic (congestive) heart failure: Secondary | ICD-10-CM | POA: Diagnosis not present

## 2015-10-03 DIAGNOSIS — Z9981 Dependence on supplemental oxygen: Secondary | ICD-10-CM | POA: Diagnosis not present

## 2015-10-03 DIAGNOSIS — I10 Essential (primary) hypertension: Secondary | ICD-10-CM | POA: Diagnosis not present

## 2015-10-03 DIAGNOSIS — E662 Morbid (severe) obesity with alveolar hypoventilation: Secondary | ICD-10-CM | POA: Diagnosis not present

## 2015-10-03 DIAGNOSIS — I270 Primary pulmonary hypertension: Secondary | ICD-10-CM | POA: Diagnosis not present

## 2015-10-03 DIAGNOSIS — I340 Nonrheumatic mitral (valve) insufficiency: Secondary | ICD-10-CM | POA: Diagnosis not present

## 2015-10-03 DIAGNOSIS — E119 Type 2 diabetes mellitus without complications: Secondary | ICD-10-CM | POA: Diagnosis not present

## 2015-10-03 DIAGNOSIS — Z9181 History of falling: Secondary | ICD-10-CM | POA: Diagnosis not present

## 2015-10-04 DIAGNOSIS — Z9981 Dependence on supplemental oxygen: Secondary | ICD-10-CM | POA: Diagnosis not present

## 2015-10-04 DIAGNOSIS — I10 Essential (primary) hypertension: Secondary | ICD-10-CM | POA: Diagnosis not present

## 2015-10-04 DIAGNOSIS — E662 Morbid (severe) obesity with alveolar hypoventilation: Secondary | ICD-10-CM | POA: Diagnosis not present

## 2015-10-04 DIAGNOSIS — I5033 Acute on chronic diastolic (congestive) heart failure: Secondary | ICD-10-CM | POA: Diagnosis not present

## 2015-10-04 DIAGNOSIS — Z9181 History of falling: Secondary | ICD-10-CM | POA: Diagnosis not present

## 2015-10-04 DIAGNOSIS — E119 Type 2 diabetes mellitus without complications: Secondary | ICD-10-CM | POA: Diagnosis not present

## 2015-10-04 DIAGNOSIS — I340 Nonrheumatic mitral (valve) insufficiency: Secondary | ICD-10-CM | POA: Diagnosis not present

## 2015-10-04 DIAGNOSIS — I270 Primary pulmonary hypertension: Secondary | ICD-10-CM | POA: Diagnosis not present

## 2015-10-04 DIAGNOSIS — I482 Chronic atrial fibrillation: Secondary | ICD-10-CM | POA: Diagnosis not present

## 2015-10-05 ENCOUNTER — Other Ambulatory Visit: Payer: Self-pay | Admitting: *Deleted

## 2015-10-05 DIAGNOSIS — I340 Nonrheumatic mitral (valve) insufficiency: Secondary | ICD-10-CM | POA: Diagnosis not present

## 2015-10-05 DIAGNOSIS — E662 Morbid (severe) obesity with alveolar hypoventilation: Secondary | ICD-10-CM | POA: Diagnosis not present

## 2015-10-05 DIAGNOSIS — I270 Primary pulmonary hypertension: Secondary | ICD-10-CM | POA: Diagnosis not present

## 2015-10-05 DIAGNOSIS — I482 Chronic atrial fibrillation: Secondary | ICD-10-CM | POA: Diagnosis not present

## 2015-10-05 DIAGNOSIS — I10 Essential (primary) hypertension: Secondary | ICD-10-CM | POA: Diagnosis not present

## 2015-10-05 DIAGNOSIS — Z9981 Dependence on supplemental oxygen: Secondary | ICD-10-CM | POA: Diagnosis not present

## 2015-10-05 DIAGNOSIS — Z9181 History of falling: Secondary | ICD-10-CM | POA: Diagnosis not present

## 2015-10-05 DIAGNOSIS — I5033 Acute on chronic diastolic (congestive) heart failure: Secondary | ICD-10-CM | POA: Diagnosis not present

## 2015-10-05 DIAGNOSIS — E119 Type 2 diabetes mellitus without complications: Secondary | ICD-10-CM | POA: Diagnosis not present

## 2015-10-05 NOTE — Patient Outreach (Signed)
Weekly transition of care call placed to daughter, Arn Medal, who is the caregiver for the member.  Daughter states that the member is "doing good" and is currently working with the occupational therapist.  She states that her fluid status and weight has been maintained with the increased Lasix dose from last week.  She states that the member has to go in for labs next week, which will determine any further medication changes.  Daughter denies any concerns at this time.  Confirms routine home visit for next week.  Encouraged to contact this care manager with any questions.  Valente David, BSN, [place] Management  [facility] Care Manager [phone]

## 2015-10-08 DIAGNOSIS — I270 Primary pulmonary hypertension: Secondary | ICD-10-CM | POA: Diagnosis not present

## 2015-10-08 DIAGNOSIS — I5033 Acute on chronic diastolic (congestive) heart failure: Secondary | ICD-10-CM | POA: Diagnosis not present

## 2015-10-08 DIAGNOSIS — I482 Chronic atrial fibrillation: Secondary | ICD-10-CM | POA: Diagnosis not present

## 2015-10-08 DIAGNOSIS — I10 Essential (primary) hypertension: Secondary | ICD-10-CM | POA: Diagnosis not present

## 2015-10-08 DIAGNOSIS — Z9981 Dependence on supplemental oxygen: Secondary | ICD-10-CM | POA: Diagnosis not present

## 2015-10-08 DIAGNOSIS — E119 Type 2 diabetes mellitus without complications: Secondary | ICD-10-CM | POA: Diagnosis not present

## 2015-10-08 DIAGNOSIS — I340 Nonrheumatic mitral (valve) insufficiency: Secondary | ICD-10-CM | POA: Diagnosis not present

## 2015-10-08 DIAGNOSIS — Z9181 History of falling: Secondary | ICD-10-CM | POA: Diagnosis not present

## 2015-10-08 DIAGNOSIS — E662 Morbid (severe) obesity with alveolar hypoventilation: Secondary | ICD-10-CM | POA: Diagnosis not present

## 2015-10-09 ENCOUNTER — Other Ambulatory Visit: Payer: Self-pay | Admitting: *Deleted

## 2015-10-09 ENCOUNTER — Ambulatory Visit (HOSPITAL_COMMUNITY)
Admission: RE | Admit: 2015-10-09 | Discharge: 2015-10-09 | Disposition: A | Discharge status: Home / Self Care | Payer: Commercial Managed Care - HMO | Source: Ambulatory Visit | Attending: Cardiology | Admitting: Cardiology

## 2015-10-09 ENCOUNTER — Ambulatory Visit: Payer: Commercial Managed Care - HMO | Admitting: *Deleted

## 2015-10-09 DIAGNOSIS — I5022 Chronic systolic (congestive) heart failure: Secondary | ICD-10-CM | POA: Diagnosis not present

## 2015-10-09 DIAGNOSIS — I5032 Chronic diastolic (congestive) heart failure: Secondary | ICD-10-CM | POA: Diagnosis not present

## 2015-10-09 LAB — BASIC METABOLIC PANEL WITH GFR
Anion gap: 9 (ref 5–15)
BUN: 26 mg/dL — ABNORMAL HIGH (ref 6–20)
CO2: 39 mmol/L — ABNORMAL HIGH (ref 22–32)
Calcium: 9.2 mg/dL (ref 8.9–10.3)
Chloride: 96 mmol/L — ABNORMAL LOW (ref 101–111)
Creatinine, Ser: 1.49 mg/dL — ABNORMAL HIGH (ref 0.44–1.00)
GFR calc Af Amer: 35 mL/min — ABNORMAL LOW
GFR calc non Af Amer: 31 mL/min — ABNORMAL LOW
Glucose, Bld: 176 mg/dL — ABNORMAL HIGH (ref 65–99)
Potassium: 3.7 mmol/L (ref 3.5–5.1)
Sodium: 144 mmol/L (ref 135–145)

## 2015-10-09 LAB — BRAIN NATRIURETIC PEPTIDE: B Natriuretic Peptide: 412.6 pg/mL — ABNORMAL HIGH (ref 0.0–100.0)

## 2015-10-09 NOTE — Patient Outreach (Signed)
Arrived to [facility] home, member and daughter not available.  Member had appointment for labs this morning, daughter stated last week that they would be back home in time for home visit.  Call placed to member's daughter to inquire about home visit.  Daughter answer's phone and states that she will call back.  She calls back and states that she will have to reschedule.  She states that she took the member to vote thinking that she would be back in time for the home visit.  Daughter apologizes and states that she will call back when she has her calendar to reschedule.  Will await call back.  [name], BSN, [place] Management  [facility] Care Manager [phone]

## 2015-10-10 DIAGNOSIS — I340 Nonrheumatic mitral (valve) insufficiency: Secondary | ICD-10-CM | POA: Diagnosis not present

## 2015-10-10 DIAGNOSIS — I5033 Acute on chronic diastolic (congestive) heart failure: Secondary | ICD-10-CM | POA: Diagnosis not present

## 2015-10-10 DIAGNOSIS — E662 Morbid (severe) obesity with alveolar hypoventilation: Secondary | ICD-10-CM | POA: Diagnosis not present

## 2015-10-10 DIAGNOSIS — I482 Chronic atrial fibrillation: Secondary | ICD-10-CM | POA: Diagnosis not present

## 2015-10-10 DIAGNOSIS — I10 Essential (primary) hypertension: Secondary | ICD-10-CM | POA: Diagnosis not present

## 2015-10-10 DIAGNOSIS — Z9981 Dependence on supplemental oxygen: Secondary | ICD-10-CM | POA: Diagnosis not present

## 2015-10-10 DIAGNOSIS — E119 Type 2 diabetes mellitus without complications: Secondary | ICD-10-CM | POA: Diagnosis not present

## 2015-10-10 DIAGNOSIS — Z9181 History of falling: Secondary | ICD-10-CM | POA: Diagnosis not present

## 2015-10-10 DIAGNOSIS — I270 Primary pulmonary hypertension: Secondary | ICD-10-CM | POA: Diagnosis not present

## 2015-10-11 DIAGNOSIS — Z9981 Dependence on supplemental oxygen: Secondary | ICD-10-CM | POA: Diagnosis not present

## 2015-10-11 DIAGNOSIS — Z9181 History of falling: Secondary | ICD-10-CM | POA: Diagnosis not present

## 2015-10-11 DIAGNOSIS — I10 Essential (primary) hypertension: Secondary | ICD-10-CM | POA: Diagnosis not present

## 2015-10-11 DIAGNOSIS — E119 Type 2 diabetes mellitus without complications: Secondary | ICD-10-CM | POA: Diagnosis not present

## 2015-10-11 DIAGNOSIS — E662 Morbid (severe) obesity with alveolar hypoventilation: Secondary | ICD-10-CM | POA: Diagnosis not present

## 2015-10-11 DIAGNOSIS — I5033 Acute on chronic diastolic (congestive) heart failure: Secondary | ICD-10-CM | POA: Diagnosis not present

## 2015-10-11 DIAGNOSIS — I482 Chronic atrial fibrillation: Secondary | ICD-10-CM | POA: Diagnosis not present

## 2015-10-11 DIAGNOSIS — I270 Primary pulmonary hypertension: Secondary | ICD-10-CM | POA: Diagnosis not present

## 2015-10-11 DIAGNOSIS — I340 Nonrheumatic mitral (valve) insufficiency: Secondary | ICD-10-CM | POA: Diagnosis not present

## 2015-10-13 DIAGNOSIS — I10 Essential (primary) hypertension: Secondary | ICD-10-CM | POA: Diagnosis not present

## 2015-10-13 DIAGNOSIS — I4891 Unspecified atrial fibrillation: Secondary | ICD-10-CM | POA: Diagnosis not present

## 2015-10-13 DIAGNOSIS — I5043 Acute on chronic combined systolic (congestive) and diastolic (congestive) heart failure: Secondary | ICD-10-CM | POA: Diagnosis not present

## 2015-10-13 DIAGNOSIS — K922 Gastrointestinal hemorrhage, unspecified: Secondary | ICD-10-CM | POA: Diagnosis not present

## 2015-10-13 DIAGNOSIS — K264 Chronic or unspecified duodenal ulcer with hemorrhage: Secondary | ICD-10-CM | POA: Diagnosis not present

## 2015-10-15 DIAGNOSIS — E662 Morbid (severe) obesity with alveolar hypoventilation: Secondary | ICD-10-CM | POA: Diagnosis not present

## 2015-10-15 DIAGNOSIS — I270 Primary pulmonary hypertension: Secondary | ICD-10-CM | POA: Diagnosis not present

## 2015-10-15 DIAGNOSIS — I482 Chronic atrial fibrillation: Secondary | ICD-10-CM | POA: Diagnosis not present

## 2015-10-15 DIAGNOSIS — E119 Type 2 diabetes mellitus without complications: Secondary | ICD-10-CM | POA: Diagnosis not present

## 2015-10-15 DIAGNOSIS — I5033 Acute on chronic diastolic (congestive) heart failure: Secondary | ICD-10-CM | POA: Diagnosis not present

## 2015-10-15 DIAGNOSIS — Z9981 Dependence on supplemental oxygen: Secondary | ICD-10-CM | POA: Diagnosis not present

## 2015-10-15 DIAGNOSIS — Z9181 History of falling: Secondary | ICD-10-CM | POA: Diagnosis not present

## 2015-10-15 DIAGNOSIS — I340 Nonrheumatic mitral (valve) insufficiency: Secondary | ICD-10-CM | POA: Diagnosis not present

## 2015-10-15 DIAGNOSIS — I10 Essential (primary) hypertension: Secondary | ICD-10-CM | POA: Diagnosis not present

## 2015-10-16 ENCOUNTER — Other Ambulatory Visit: Payer: Self-pay | Admitting: *Deleted

## 2015-10-16 DIAGNOSIS — E662 Morbid (severe) obesity with alveolar hypoventilation: Secondary | ICD-10-CM | POA: Diagnosis not present

## 2015-10-16 DIAGNOSIS — Z9981 Dependence on supplemental oxygen: Secondary | ICD-10-CM | POA: Diagnosis not present

## 2015-10-16 DIAGNOSIS — I482 Chronic atrial fibrillation: Secondary | ICD-10-CM | POA: Diagnosis not present

## 2015-10-16 DIAGNOSIS — I10 Essential (primary) hypertension: Secondary | ICD-10-CM | POA: Diagnosis not present

## 2015-10-16 DIAGNOSIS — I340 Nonrheumatic mitral (valve) insufficiency: Secondary | ICD-10-CM | POA: Diagnosis not present

## 2015-10-16 DIAGNOSIS — I5033 Acute on chronic diastolic (congestive) heart failure: Secondary | ICD-10-CM | POA: Diagnosis not present

## 2015-10-16 DIAGNOSIS — I270 Primary pulmonary hypertension: Secondary | ICD-10-CM | POA: Diagnosis not present

## 2015-10-16 DIAGNOSIS — E119 Type 2 diabetes mellitus without complications: Secondary | ICD-10-CM | POA: Diagnosis not present

## 2015-10-16 DIAGNOSIS — Z9181 History of falling: Secondary | ICD-10-CM | POA: Diagnosis not present

## 2015-10-16 NOTE — Patient Outreach (Signed)
Call placed to daughter to reschedule last week's missed appointment.  No answer, HIPPA compliant voice message left.  Will await call back.  Valente David, BSN, [place] Management  [facility] Care Manager [phone]

## 2015-10-17 DIAGNOSIS — Z9181 History of falling: Secondary | ICD-10-CM | POA: Diagnosis not present

## 2015-10-17 DIAGNOSIS — Z9981 Dependence on supplemental oxygen: Secondary | ICD-10-CM | POA: Diagnosis not present

## 2015-10-17 DIAGNOSIS — I270 Primary pulmonary hypertension: Secondary | ICD-10-CM | POA: Diagnosis not present

## 2015-10-17 DIAGNOSIS — E119 Type 2 diabetes mellitus without complications: Secondary | ICD-10-CM | POA: Diagnosis not present

## 2015-10-17 DIAGNOSIS — I5033 Acute on chronic diastolic (congestive) heart failure: Secondary | ICD-10-CM | POA: Diagnosis not present

## 2015-10-17 DIAGNOSIS — I10 Essential (primary) hypertension: Secondary | ICD-10-CM | POA: Diagnosis not present

## 2015-10-17 DIAGNOSIS — E662 Morbid (severe) obesity with alveolar hypoventilation: Secondary | ICD-10-CM | POA: Diagnosis not present

## 2015-10-17 DIAGNOSIS — I482 Chronic atrial fibrillation: Secondary | ICD-10-CM | POA: Diagnosis not present

## 2015-10-17 DIAGNOSIS — I340 Nonrheumatic mitral (valve) insufficiency: Secondary | ICD-10-CM | POA: Diagnosis not present

## 2015-10-18 ENCOUNTER — Ambulatory Visit (HOSPITAL_COMMUNITY)
Admission: RE | Admit: 2015-10-18 | Discharge: 2015-10-18 | Disposition: A | Discharge status: Home / Self Care | Payer: Commercial Managed Care - HMO | Source: Ambulatory Visit | Attending: Cardiology | Admitting: Cardiology

## 2015-10-18 VITALS — BP 100/52 | HR 94 | Wt 227.5 lb

## 2015-10-18 DIAGNOSIS — I5033 Acute on chronic diastolic (congestive) heart failure: Secondary | ICD-10-CM | POA: Diagnosis not present

## 2015-10-18 DIAGNOSIS — Z9181 History of falling: Secondary | ICD-10-CM | POA: Diagnosis not present

## 2015-10-18 DIAGNOSIS — Z79899 Other long term (current) drug therapy: Secondary | ICD-10-CM | POA: Diagnosis not present

## 2015-10-18 DIAGNOSIS — R011 Cardiac murmur, unspecified: Secondary | ICD-10-CM | POA: Diagnosis not present

## 2015-10-18 DIAGNOSIS — E785 Hyperlipidemia, unspecified: Secondary | ICD-10-CM | POA: Diagnosis not present

## 2015-10-18 DIAGNOSIS — Z9981 Dependence on supplemental oxygen: Secondary | ICD-10-CM | POA: Diagnosis not present

## 2015-10-18 DIAGNOSIS — I272 Other secondary pulmonary hypertension: Secondary | ICD-10-CM | POA: Diagnosis not present

## 2015-10-18 DIAGNOSIS — N183 Chronic kidney disease, stage 3 (moderate): Secondary | ICD-10-CM | POA: Diagnosis not present

## 2015-10-18 DIAGNOSIS — I340 Nonrheumatic mitral (valve) insufficiency: Secondary | ICD-10-CM | POA: Diagnosis not present

## 2015-10-18 DIAGNOSIS — E662 Morbid (severe) obesity with alveolar hypoventilation: Secondary | ICD-10-CM | POA: Diagnosis not present

## 2015-10-18 DIAGNOSIS — I482 Chronic atrial fibrillation: Secondary | ICD-10-CM | POA: Insufficient documentation

## 2015-10-18 DIAGNOSIS — Z993 Dependence on wheelchair: Secondary | ICD-10-CM | POA: Insufficient documentation

## 2015-10-18 DIAGNOSIS — D649 Anemia, unspecified: Secondary | ICD-10-CM | POA: Insufficient documentation

## 2015-10-18 DIAGNOSIS — Z7901 Long term (current) use of anticoagulants: Secondary | ICD-10-CM | POA: Diagnosis not present

## 2015-10-18 DIAGNOSIS — E119 Type 2 diabetes mellitus without complications: Secondary | ICD-10-CM | POA: Diagnosis not present

## 2015-10-18 DIAGNOSIS — E1122 Type 2 diabetes mellitus with diabetic chronic kidney disease: Secondary | ICD-10-CM | POA: Diagnosis not present

## 2015-10-18 DIAGNOSIS — I270 Primary pulmonary hypertension: Secondary | ICD-10-CM | POA: Diagnosis not present

## 2015-10-18 DIAGNOSIS — Z86718 Personal history of other venous thrombosis and embolism: Secondary | ICD-10-CM | POA: Insufficient documentation

## 2015-10-18 DIAGNOSIS — I10 Essential (primary) hypertension: Secondary | ICD-10-CM | POA: Diagnosis not present

## 2015-10-18 DIAGNOSIS — I130 Hypertensive heart and chronic kidney disease with heart failure and stage 1 through stage 4 chronic kidney disease, or unspecified chronic kidney disease: Secondary | ICD-10-CM | POA: Insufficient documentation

## 2015-10-18 DIAGNOSIS — I5032 Chronic diastolic (congestive) heart failure: Secondary | ICD-10-CM | POA: Diagnosis not present

## 2015-10-18 NOTE — Patient Instructions (Signed)
Your physician recommends that you schedule a follow-up appointment in: 6 weeks  

## 2015-10-18 NOTE — Progress Notes (Signed)
Patient ID: [name], female   DOB: January 27, 1926, 81 y.o.   MRN: [id number] PCP: Dr. Doy Hutching Referring: Dr. Melvyn Novas Cardiology: Dr. Aundra Dubin  81 yo with chronic atrial fibrillation on coumadin, chronic diastolic CHF, and OHS presents for cardiology evaluation.  She has had several recent admissions.  In 6/16, she had syncope while sitting on the toilet.  She came to the ER.  Troponin was 0.21 and temperature was up to 101.  She was admitted and ended up having cardiac cath showing no obstructive CAD but 3+ MR.  Echo showed normal EF with grade III diastolic dysfunction, severe pulmonary hypertension, and per report only trivial MR.  She was re-admitted later in 6/16 after a fall and syncope again.  She was found to be profoundly hypoglycemic.  She is no longer on diabetes medications.    She was referred to Dr Melvyn Novas and has been on home oxygen for OHS.  She has been wheelchair-bound for the most part since 6/16.  She used to live alone prior to 6/16 but now lives with her daughter.  Admitted to [facility] 9/16 with marked volume overload. Diuresed with IV lasix and transitioned to torsemide 40 mg mg in am and 20 mg in pm. Had RHC with elevated filling pressures in a restrictive pattern and pulmonary venous htn. Discharge weight was 232 pounds.   Today she returns for post hospital follow up with her family.  Weight is down 5 lbs since increasing torsemide. Still with limited mobility, dyspnea walking around her house but doing more walking now. She is able to get up and down the hall in her house without problem.  Wears oxygen continuously.  At a prior appointment, she reported "dizzy spells" and just completed a 30 day monitor.  These seem to have resolved.  She has chronic orthopnea and sleeps in a recliner.  No chest pain.  She is on warfarin, no BRBPR or melena.    Labs (7/16): K 3.7, creatinine 0.93, BNP 370 Labs (9/16): hgb 8.4 Labs (10/16): K 4.3, creatinine 1.41 Labs (11/16): K 3.7, creatinine 1.49, BNP  413  ECG:   PMH: 1. HTN 2. Type II diabetes: Currently on no meds.  3. Syncope 4. Chronic atrial fibrillation: No history of CVA.  5. Chronic diastolic CHF: Echo (123XX123) with EF 55-60%, grade III diastolic dysfunction, PA systolic pressure 76 mmHg, trivial MR.  6. Cardiolite (2/16) with EF 48%, no ischemia.  LHC (6/16) with 20% distal LAD stenosis, EF 55-60%, 3+ MR.  7. Mitral regurgitation: Unclear how significant this is.  Trivial per 6/16 echo but 3+ per 6/16 cath.  8. H/o DVT 9. DCIS s/p lumpectomy and radiation in 2011.   10. OA 11. Hyperlipidemia 12. OHS: On home oxygen.  13. Morbid obesity 14. RHC 08/09/2015 RA mean 29, RV 73/24, PA 74/33, mean 45,PCWP mean 33,Oxygen saturations:PA 49%AO 98% ,CO 5.12 /CI2.27, PVR 2.3 WU  SH: Lives in [place] with family, never smoked.   FH: No premature CAD. +CHF.   ROS: All systems reviewed and negative except as per HPI.   Current Outpatient Prescriptions  Medication Sig Dispense Refill  . acetaminophen (TYLENOL) 500 MG tablet Take 1,000 mg by mouth 2 (two) times daily as needed for mild pain.     Marland Kitchen atorvastatin (LIPITOR) 20 MG tablet Take 20 mg by mouth daily.    Marland Kitchen gabapentin (NEURONTIN) 300 MG capsule Take 300 mg by mouth at bedtime.   3  . metoprolol tartrate (LOPRESSOR) 25 MG tablet  Take 1 tablet (25 mg total) by mouth 2 (two) times daily. 180 tablet 3  . pantoprazole (PROTONIX) 40 MG tablet Take 1 tablet (40 mg total) by mouth 2 (two) times daily before a meal. 60 tablet 3  . potassium chloride SA (KLOR-CON M20) 20 MEQ tablet Take 2 tablets (40 mEq total) by mouth daily. 60 tablet 6  . torsemide (DEMADEX) 20 MG tablet Take 2 tablets (40 mg total) by mouth 2 (two) times daily. 120 tablet 6  . warfarin (COUMADIN) 2 MG tablet Take 2-4 mg by mouth daily. Take 2 mg daily except 4 mg on Thursday's and and Saturday's.     No current facility-administered medications for this encounter.   BP 100/52 mmHg  Pulse 94  Wt 227 lb 8 oz  (103.193 kg)  SpO2 98%  General: NAD, obese Arrived in wheel chair . Daughter present .  Neck: JVP 8 cm, no thyromegaly or thyroid nodule.  Lungs: Crackles at bases bilaterally  CV: Nondisplaced PMI.  Heart irregular S1/S2, no S3/S4, 2/6 HSM at apex.  No lower extremity edema. No carotid bruit.  Normal pedal pulses.  Abdomen: Soft, nontender, no hepatosplenomegaly, no distention.  Skin: Intact without lesions or rashes.  Neurologic: Alert and oriented x 3.  Psych: Normal affect. Extremities: No clubbing or cyanosis. R and LLE compression stockings in place with 1+ ankle edema. HEENT: Normal.   Assessment/Plan: 1. Chronic diastolic CHF: NYHA III. Volume status looks better and weight is down. - Continue torsemide 40 mg bid today.  Recent BMET stable.  2. HTN: Stable.   3. Pulmonary hypertension: Likely pulmonary venous hypertension that improved with diuresis. Continue continuous oxygen.  4. Murmur: Moderate TR on echo.   5. Atrial fibrillation: Chronic. Continue rate control/anticoagulation strategy. Remains on warfarin with stable anemia. 6. "Dizzy spells:" She just completed 30 day monitor. These are improved.  7. CKD: Stage III. This has been stable.   Followup in 6 wks.   Loralie Champagne 10/18/2015

## 2015-10-19 DIAGNOSIS — I482 Chronic atrial fibrillation: Secondary | ICD-10-CM | POA: Diagnosis not present

## 2015-10-19 DIAGNOSIS — I5033 Acute on chronic diastolic (congestive) heart failure: Secondary | ICD-10-CM | POA: Diagnosis not present

## 2015-10-19 DIAGNOSIS — Z9181 History of falling: Secondary | ICD-10-CM | POA: Diagnosis not present

## 2015-10-19 DIAGNOSIS — I270 Primary pulmonary hypertension: Secondary | ICD-10-CM | POA: Diagnosis not present

## 2015-10-19 DIAGNOSIS — E119 Type 2 diabetes mellitus without complications: Secondary | ICD-10-CM | POA: Diagnosis not present

## 2015-10-19 DIAGNOSIS — E662 Morbid (severe) obesity with alveolar hypoventilation: Secondary | ICD-10-CM | POA: Diagnosis not present

## 2015-10-19 DIAGNOSIS — I340 Nonrheumatic mitral (valve) insufficiency: Secondary | ICD-10-CM | POA: Diagnosis not present

## 2015-10-19 DIAGNOSIS — I10 Essential (primary) hypertension: Secondary | ICD-10-CM | POA: Diagnosis not present

## 2015-10-19 DIAGNOSIS — Z9981 Dependence on supplemental oxygen: Secondary | ICD-10-CM | POA: Diagnosis not present

## 2015-10-22 DIAGNOSIS — I5033 Acute on chronic diastolic (congestive) heart failure: Secondary | ICD-10-CM | POA: Diagnosis not present

## 2015-10-22 DIAGNOSIS — Z9181 History of falling: Secondary | ICD-10-CM | POA: Diagnosis not present

## 2015-10-22 DIAGNOSIS — E119 Type 2 diabetes mellitus without complications: Secondary | ICD-10-CM | POA: Diagnosis not present

## 2015-10-22 DIAGNOSIS — I10 Essential (primary) hypertension: Secondary | ICD-10-CM | POA: Diagnosis not present

## 2015-10-22 DIAGNOSIS — I340 Nonrheumatic mitral (valve) insufficiency: Secondary | ICD-10-CM | POA: Diagnosis not present

## 2015-10-22 DIAGNOSIS — Z9981 Dependence on supplemental oxygen: Secondary | ICD-10-CM | POA: Diagnosis not present

## 2015-10-22 DIAGNOSIS — I482 Chronic atrial fibrillation: Secondary | ICD-10-CM | POA: Diagnosis not present

## 2015-10-22 DIAGNOSIS — E662 Morbid (severe) obesity with alveolar hypoventilation: Secondary | ICD-10-CM | POA: Diagnosis not present

## 2015-10-22 DIAGNOSIS — I270 Primary pulmonary hypertension: Secondary | ICD-10-CM | POA: Diagnosis not present

## 2015-10-23 DIAGNOSIS — I10 Essential (primary) hypertension: Secondary | ICD-10-CM | POA: Diagnosis not present

## 2015-10-23 DIAGNOSIS — I340 Nonrheumatic mitral (valve) insufficiency: Secondary | ICD-10-CM | POA: Diagnosis not present

## 2015-10-23 DIAGNOSIS — Z9981 Dependence on supplemental oxygen: Secondary | ICD-10-CM | POA: Diagnosis not present

## 2015-10-23 DIAGNOSIS — I270 Primary pulmonary hypertension: Secondary | ICD-10-CM | POA: Diagnosis not present

## 2015-10-23 DIAGNOSIS — Z9181 History of falling: Secondary | ICD-10-CM | POA: Diagnosis not present

## 2015-10-23 DIAGNOSIS — I5033 Acute on chronic diastolic (congestive) heart failure: Secondary | ICD-10-CM | POA: Diagnosis not present

## 2015-10-23 DIAGNOSIS — E662 Morbid (severe) obesity with alveolar hypoventilation: Secondary | ICD-10-CM | POA: Diagnosis not present

## 2015-10-23 DIAGNOSIS — I482 Chronic atrial fibrillation: Secondary | ICD-10-CM | POA: Diagnosis not present

## 2015-10-23 DIAGNOSIS — E119 Type 2 diabetes mellitus without complications: Secondary | ICD-10-CM | POA: Diagnosis not present

## 2015-10-24 DIAGNOSIS — I5033 Acute on chronic diastolic (congestive) heart failure: Secondary | ICD-10-CM | POA: Diagnosis not present

## 2015-10-24 DIAGNOSIS — E119 Type 2 diabetes mellitus without complications: Secondary | ICD-10-CM | POA: Diagnosis not present

## 2015-10-24 DIAGNOSIS — Z9981 Dependence on supplemental oxygen: Secondary | ICD-10-CM | POA: Diagnosis not present

## 2015-10-24 DIAGNOSIS — I340 Nonrheumatic mitral (valve) insufficiency: Secondary | ICD-10-CM | POA: Diagnosis not present

## 2015-10-24 DIAGNOSIS — I482 Chronic atrial fibrillation: Secondary | ICD-10-CM | POA: Diagnosis not present

## 2015-10-24 DIAGNOSIS — Z9181 History of falling: Secondary | ICD-10-CM | POA: Diagnosis not present

## 2015-10-24 DIAGNOSIS — I270 Primary pulmonary hypertension: Secondary | ICD-10-CM | POA: Diagnosis not present

## 2015-10-24 DIAGNOSIS — E662 Morbid (severe) obesity with alveolar hypoventilation: Secondary | ICD-10-CM | POA: Diagnosis not present

## 2015-10-24 DIAGNOSIS — I10 Essential (primary) hypertension: Secondary | ICD-10-CM | POA: Diagnosis not present

## 2015-10-25 DIAGNOSIS — I340 Nonrheumatic mitral (valve) insufficiency: Secondary | ICD-10-CM | POA: Diagnosis not present

## 2015-10-25 DIAGNOSIS — I10 Essential (primary) hypertension: Secondary | ICD-10-CM | POA: Diagnosis not present

## 2015-10-25 DIAGNOSIS — Z9981 Dependence on supplemental oxygen: Secondary | ICD-10-CM | POA: Diagnosis not present

## 2015-10-25 DIAGNOSIS — I5033 Acute on chronic diastolic (congestive) heart failure: Secondary | ICD-10-CM | POA: Diagnosis not present

## 2015-10-25 DIAGNOSIS — E662 Morbid (severe) obesity with alveolar hypoventilation: Secondary | ICD-10-CM | POA: Diagnosis not present

## 2015-10-25 DIAGNOSIS — I482 Chronic atrial fibrillation: Secondary | ICD-10-CM | POA: Diagnosis not present

## 2015-10-25 DIAGNOSIS — E119 Type 2 diabetes mellitus without complications: Secondary | ICD-10-CM | POA: Diagnosis not present

## 2015-10-25 DIAGNOSIS — Z9181 History of falling: Secondary | ICD-10-CM | POA: Diagnosis not present

## 2015-10-25 DIAGNOSIS — I270 Primary pulmonary hypertension: Secondary | ICD-10-CM | POA: Diagnosis not present

## 2015-10-26 ENCOUNTER — Encounter: Payer: Self-pay | Admitting: *Deleted

## 2015-10-26 ENCOUNTER — Other Ambulatory Visit: Payer: Self-pay | Admitting: *Deleted

## 2015-10-26 DIAGNOSIS — I270 Primary pulmonary hypertension: Secondary | ICD-10-CM | POA: Diagnosis not present

## 2015-10-26 DIAGNOSIS — I5033 Acute on chronic diastolic (congestive) heart failure: Secondary | ICD-10-CM | POA: Diagnosis not present

## 2015-10-26 DIAGNOSIS — E662 Morbid (severe) obesity with alveolar hypoventilation: Secondary | ICD-10-CM | POA: Diagnosis not present

## 2015-10-26 DIAGNOSIS — I482 Chronic atrial fibrillation: Secondary | ICD-10-CM | POA: Diagnosis not present

## 2015-10-26 DIAGNOSIS — Z9181 History of falling: Secondary | ICD-10-CM | POA: Diagnosis not present

## 2015-10-26 DIAGNOSIS — I340 Nonrheumatic mitral (valve) insufficiency: Secondary | ICD-10-CM | POA: Diagnosis not present

## 2015-10-26 DIAGNOSIS — E119 Type 2 diabetes mellitus without complications: Secondary | ICD-10-CM | POA: Diagnosis not present

## 2015-10-26 DIAGNOSIS — Z9981 Dependence on supplemental oxygen: Secondary | ICD-10-CM | POA: Diagnosis not present

## 2015-10-26 DIAGNOSIS — I10 Essential (primary) hypertension: Secondary | ICD-10-CM | POA: Diagnosis not present

## 2015-10-26 NOTE — Patient Outreach (Signed)
Call placed to member's daughter to follow up on member's status.  Daughter states that the member has been doing well and that she has had no health issues.  She states that she has been monitoring the member's weight, blood pressure, and oxygen level daily and communicating results with physicians.  She expresses no further need for [facility] care management services, but does express the need to have help with them member's ADLs.  Discussed the [place] with the daughter and she is interested in receiving more information.  Request made to care management assistant to send information packet.  Daughter made aware that the member's case would be closed, but that if there were any concerns in the future to contact the main office.  She verbalizes understanding.  Will notify care management assistant and physician of case closure.  Valente David, BSN, [facility]  [facility] Care Manager [phone]

## 2015-10-26 NOTE — Patient Outreach (Signed)
[facility]) Care Management  10/26/2015  KIPPY CRUME [birth year] [id number]   Received request from Valente David, RN to send patient information on [place] sent 10/26/15.   Anorah Trias L. Melvin Whiteford, [place] Care Management Assistant

## 2015-10-29 DIAGNOSIS — Z9181 History of falling: Secondary | ICD-10-CM | POA: Diagnosis not present

## 2015-10-29 DIAGNOSIS — I5033 Acute on chronic diastolic (congestive) heart failure: Secondary | ICD-10-CM | POA: Diagnosis not present

## 2015-10-29 DIAGNOSIS — I482 Chronic atrial fibrillation: Secondary | ICD-10-CM | POA: Diagnosis not present

## 2015-10-29 DIAGNOSIS — E119 Type 2 diabetes mellitus without complications: Secondary | ICD-10-CM | POA: Diagnosis not present

## 2015-10-29 DIAGNOSIS — I10 Essential (primary) hypertension: Secondary | ICD-10-CM | POA: Diagnosis not present

## 2015-10-29 DIAGNOSIS — I340 Nonrheumatic mitral (valve) insufficiency: Secondary | ICD-10-CM | POA: Diagnosis not present

## 2015-10-29 DIAGNOSIS — I270 Primary pulmonary hypertension: Secondary | ICD-10-CM | POA: Diagnosis not present

## 2015-10-29 DIAGNOSIS — E662 Morbid (severe) obesity with alveolar hypoventilation: Secondary | ICD-10-CM | POA: Diagnosis not present

## 2015-10-29 DIAGNOSIS — Z9981 Dependence on supplemental oxygen: Secondary | ICD-10-CM | POA: Diagnosis not present

## 2015-10-31 DIAGNOSIS — I5033 Acute on chronic diastolic (congestive) heart failure: Secondary | ICD-10-CM | POA: Diagnosis not present

## 2015-10-31 DIAGNOSIS — E119 Type 2 diabetes mellitus without complications: Secondary | ICD-10-CM | POA: Diagnosis not present

## 2015-10-31 DIAGNOSIS — I270 Primary pulmonary hypertension: Secondary | ICD-10-CM | POA: Diagnosis not present

## 2015-10-31 DIAGNOSIS — Z9181 History of falling: Secondary | ICD-10-CM | POA: Diagnosis not present

## 2015-10-31 DIAGNOSIS — I340 Nonrheumatic mitral (valve) insufficiency: Secondary | ICD-10-CM | POA: Diagnosis not present

## 2015-10-31 DIAGNOSIS — I10 Essential (primary) hypertension: Secondary | ICD-10-CM | POA: Diagnosis not present

## 2015-10-31 DIAGNOSIS — E662 Morbid (severe) obesity with alveolar hypoventilation: Secondary | ICD-10-CM | POA: Diagnosis not present

## 2015-10-31 DIAGNOSIS — Z9981 Dependence on supplemental oxygen: Secondary | ICD-10-CM | POA: Diagnosis not present

## 2015-10-31 DIAGNOSIS — I482 Chronic atrial fibrillation: Secondary | ICD-10-CM | POA: Diagnosis not present

## 2015-11-02 DIAGNOSIS — I340 Nonrheumatic mitral (valve) insufficiency: Secondary | ICD-10-CM | POA: Diagnosis not present

## 2015-11-02 DIAGNOSIS — I10 Essential (primary) hypertension: Secondary | ICD-10-CM | POA: Diagnosis not present

## 2015-11-02 DIAGNOSIS — I5033 Acute on chronic diastolic (congestive) heart failure: Secondary | ICD-10-CM | POA: Diagnosis not present

## 2015-11-02 DIAGNOSIS — I270 Primary pulmonary hypertension: Secondary | ICD-10-CM | POA: Diagnosis not present

## 2015-11-02 DIAGNOSIS — I482 Chronic atrial fibrillation: Secondary | ICD-10-CM | POA: Diagnosis not present

## 2015-11-02 DIAGNOSIS — E119 Type 2 diabetes mellitus without complications: Secondary | ICD-10-CM | POA: Diagnosis not present

## 2015-11-02 DIAGNOSIS — Z9981 Dependence on supplemental oxygen: Secondary | ICD-10-CM | POA: Diagnosis not present

## 2015-11-02 DIAGNOSIS — Z9181 History of falling: Secondary | ICD-10-CM | POA: Diagnosis not present

## 2015-11-02 DIAGNOSIS — E662 Morbid (severe) obesity with alveolar hypoventilation: Secondary | ICD-10-CM | POA: Diagnosis not present

## 2015-11-06 DIAGNOSIS — I5033 Acute on chronic diastolic (congestive) heart failure: Secondary | ICD-10-CM | POA: Diagnosis not present

## 2015-11-06 DIAGNOSIS — I482 Chronic atrial fibrillation: Secondary | ICD-10-CM | POA: Diagnosis not present

## 2015-11-06 DIAGNOSIS — I10 Essential (primary) hypertension: Secondary | ICD-10-CM | POA: Diagnosis not present

## 2015-11-06 DIAGNOSIS — E662 Morbid (severe) obesity with alveolar hypoventilation: Secondary | ICD-10-CM | POA: Diagnosis not present

## 2015-11-06 DIAGNOSIS — I340 Nonrheumatic mitral (valve) insufficiency: Secondary | ICD-10-CM | POA: Diagnosis not present

## 2015-11-06 DIAGNOSIS — Z9981 Dependence on supplemental oxygen: Secondary | ICD-10-CM | POA: Diagnosis not present

## 2015-11-06 DIAGNOSIS — I270 Primary pulmonary hypertension: Secondary | ICD-10-CM | POA: Diagnosis not present

## 2015-11-06 DIAGNOSIS — Z9181 History of falling: Secondary | ICD-10-CM | POA: Diagnosis not present

## 2015-11-06 DIAGNOSIS — E119 Type 2 diabetes mellitus without complications: Secondary | ICD-10-CM | POA: Diagnosis not present

## 2015-11-07 DIAGNOSIS — I10 Essential (primary) hypertension: Secondary | ICD-10-CM | POA: Diagnosis not present

## 2015-11-07 DIAGNOSIS — I340 Nonrheumatic mitral (valve) insufficiency: Secondary | ICD-10-CM | POA: Diagnosis not present

## 2015-11-07 DIAGNOSIS — E662 Morbid (severe) obesity with alveolar hypoventilation: Secondary | ICD-10-CM | POA: Diagnosis not present

## 2015-11-07 DIAGNOSIS — Z9181 History of falling: Secondary | ICD-10-CM | POA: Diagnosis not present

## 2015-11-07 DIAGNOSIS — Z9981 Dependence on supplemental oxygen: Secondary | ICD-10-CM | POA: Diagnosis not present

## 2015-11-07 DIAGNOSIS — I482 Chronic atrial fibrillation: Secondary | ICD-10-CM | POA: Diagnosis not present

## 2015-11-07 DIAGNOSIS — I270 Primary pulmonary hypertension: Secondary | ICD-10-CM | POA: Diagnosis not present

## 2015-11-07 DIAGNOSIS — I5033 Acute on chronic diastolic (congestive) heart failure: Secondary | ICD-10-CM | POA: Diagnosis not present

## 2015-11-07 DIAGNOSIS — E119 Type 2 diabetes mellitus without complications: Secondary | ICD-10-CM | POA: Diagnosis not present

## 2015-11-09 DIAGNOSIS — E119 Type 2 diabetes mellitus without complications: Secondary | ICD-10-CM | POA: Diagnosis not present

## 2015-11-09 DIAGNOSIS — I5033 Acute on chronic diastolic (congestive) heart failure: Secondary | ICD-10-CM | POA: Diagnosis not present

## 2015-11-09 DIAGNOSIS — I10 Essential (primary) hypertension: Secondary | ICD-10-CM | POA: Diagnosis not present

## 2015-11-09 DIAGNOSIS — I340 Nonrheumatic mitral (valve) insufficiency: Secondary | ICD-10-CM | POA: Diagnosis not present

## 2015-11-09 DIAGNOSIS — E662 Morbid (severe) obesity with alveolar hypoventilation: Secondary | ICD-10-CM | POA: Diagnosis not present

## 2015-11-09 DIAGNOSIS — I482 Chronic atrial fibrillation: Secondary | ICD-10-CM | POA: Diagnosis not present

## 2015-11-09 DIAGNOSIS — Z9181 History of falling: Secondary | ICD-10-CM | POA: Diagnosis not present

## 2015-11-09 DIAGNOSIS — I270 Primary pulmonary hypertension: Secondary | ICD-10-CM | POA: Diagnosis not present

## 2015-11-09 DIAGNOSIS — Z9981 Dependence on supplemental oxygen: Secondary | ICD-10-CM | POA: Diagnosis not present

## 2015-11-12 DIAGNOSIS — K264 Chronic or unspecified duodenal ulcer with hemorrhage: Secondary | ICD-10-CM | POA: Diagnosis not present

## 2015-11-12 DIAGNOSIS — K922 Gastrointestinal hemorrhage, unspecified: Secondary | ICD-10-CM | POA: Diagnosis not present

## 2015-11-12 DIAGNOSIS — I5043 Acute on chronic combined systolic (congestive) and diastolic (congestive) heart failure: Secondary | ICD-10-CM | POA: Diagnosis not present

## 2015-11-12 DIAGNOSIS — I10 Essential (primary) hypertension: Secondary | ICD-10-CM | POA: Diagnosis not present

## 2015-11-12 DIAGNOSIS — I4891 Unspecified atrial fibrillation: Secondary | ICD-10-CM | POA: Diagnosis not present

## 2015-11-13 DIAGNOSIS — E119 Type 2 diabetes mellitus without complications: Secondary | ICD-10-CM | POA: Diagnosis not present

## 2015-11-13 DIAGNOSIS — I340 Nonrheumatic mitral (valve) insufficiency: Secondary | ICD-10-CM | POA: Diagnosis not present

## 2015-11-13 DIAGNOSIS — I270 Primary pulmonary hypertension: Secondary | ICD-10-CM | POA: Diagnosis not present

## 2015-11-13 DIAGNOSIS — E662 Morbid (severe) obesity with alveolar hypoventilation: Secondary | ICD-10-CM | POA: Diagnosis not present

## 2015-11-13 DIAGNOSIS — Z9181 History of falling: Secondary | ICD-10-CM | POA: Diagnosis not present

## 2015-11-13 DIAGNOSIS — I5033 Acute on chronic diastolic (congestive) heart failure: Secondary | ICD-10-CM | POA: Diagnosis not present

## 2015-11-13 DIAGNOSIS — I482 Chronic atrial fibrillation: Secondary | ICD-10-CM | POA: Diagnosis not present

## 2015-11-13 DIAGNOSIS — I10 Essential (primary) hypertension: Secondary | ICD-10-CM | POA: Diagnosis not present

## 2015-11-13 DIAGNOSIS — Z9981 Dependence on supplemental oxygen: Secondary | ICD-10-CM | POA: Diagnosis not present

## 2015-11-14 DIAGNOSIS — I270 Primary pulmonary hypertension: Secondary | ICD-10-CM | POA: Diagnosis not present

## 2015-11-14 DIAGNOSIS — I10 Essential (primary) hypertension: Secondary | ICD-10-CM | POA: Diagnosis not present

## 2015-11-14 DIAGNOSIS — I340 Nonrheumatic mitral (valve) insufficiency: Secondary | ICD-10-CM | POA: Diagnosis not present

## 2015-11-14 DIAGNOSIS — I482 Chronic atrial fibrillation: Secondary | ICD-10-CM | POA: Diagnosis not present

## 2015-11-14 DIAGNOSIS — E119 Type 2 diabetes mellitus without complications: Secondary | ICD-10-CM | POA: Diagnosis not present

## 2015-11-14 DIAGNOSIS — I5033 Acute on chronic diastolic (congestive) heart failure: Secondary | ICD-10-CM | POA: Diagnosis not present

## 2015-11-14 DIAGNOSIS — Z9181 History of falling: Secondary | ICD-10-CM | POA: Diagnosis not present

## 2015-11-14 DIAGNOSIS — Z9981 Dependence on supplemental oxygen: Secondary | ICD-10-CM | POA: Diagnosis not present

## 2015-11-14 DIAGNOSIS — E662 Morbid (severe) obesity with alveolar hypoventilation: Secondary | ICD-10-CM | POA: Diagnosis not present

## 2015-11-16 DIAGNOSIS — I340 Nonrheumatic mitral (valve) insufficiency: Secondary | ICD-10-CM | POA: Diagnosis not present

## 2015-11-16 DIAGNOSIS — Z9181 History of falling: Secondary | ICD-10-CM | POA: Diagnosis not present

## 2015-11-16 DIAGNOSIS — E662 Morbid (severe) obesity with alveolar hypoventilation: Secondary | ICD-10-CM | POA: Diagnosis not present

## 2015-11-16 DIAGNOSIS — E119 Type 2 diabetes mellitus without complications: Secondary | ICD-10-CM | POA: Diagnosis not present

## 2015-11-16 DIAGNOSIS — I482 Chronic atrial fibrillation: Secondary | ICD-10-CM | POA: Diagnosis not present

## 2015-11-16 DIAGNOSIS — I270 Primary pulmonary hypertension: Secondary | ICD-10-CM | POA: Diagnosis not present

## 2015-11-16 DIAGNOSIS — I5033 Acute on chronic diastolic (congestive) heart failure: Secondary | ICD-10-CM | POA: Diagnosis not present

## 2015-11-16 DIAGNOSIS — I10 Essential (primary) hypertension: Secondary | ICD-10-CM | POA: Diagnosis not present

## 2015-11-16 DIAGNOSIS — Z9981 Dependence on supplemental oxygen: Secondary | ICD-10-CM | POA: Diagnosis not present

## 2015-12-06 ENCOUNTER — Ambulatory Visit (HOSPITAL_COMMUNITY)
Admission: RE | Admit: 2015-12-06 | Discharge: 2015-12-06 | Disposition: A | Discharge status: Home / Self Care | Payer: Commercial Managed Care - HMO | Source: Ambulatory Visit | Attending: Cardiology | Admitting: Cardiology

## 2015-12-06 VITALS — BP 98/56 | HR 90 | Wt 240.4 lb

## 2015-12-06 DIAGNOSIS — I482 Chronic atrial fibrillation, unspecified: Secondary | ICD-10-CM

## 2015-12-06 DIAGNOSIS — N183 Chronic kidney disease, stage 3 (moderate): Secondary | ICD-10-CM | POA: Diagnosis not present

## 2015-12-06 DIAGNOSIS — I130 Hypertensive heart and chronic kidney disease with heart failure and stage 1 through stage 4 chronic kidney disease, or unspecified chronic kidney disease: Secondary | ICD-10-CM | POA: Diagnosis not present

## 2015-12-06 DIAGNOSIS — I5032 Chronic diastolic (congestive) heart failure: Secondary | ICD-10-CM | POA: Diagnosis not present

## 2015-12-06 DIAGNOSIS — I272 Other secondary pulmonary hypertension: Secondary | ICD-10-CM | POA: Insufficient documentation

## 2015-12-06 DIAGNOSIS — I5033 Acute on chronic diastolic (congestive) heart failure: Secondary | ICD-10-CM

## 2015-12-06 DIAGNOSIS — R011 Cardiac murmur, unspecified: Secondary | ICD-10-CM | POA: Diagnosis not present

## 2015-12-06 LAB — BASIC METABOLIC PANEL
Anion gap: 13 (ref 5–15)
BUN: 22 mg/dL — AB (ref 6–20)
CALCIUM: 8.9 mg/dL (ref 8.9–10.3)
CO2: 32 mmol/L (ref 22–32)
CREATININE: 1.34 mg/dL — AB (ref 0.44–1.00)
Chloride: 94 mmol/L — ABNORMAL LOW (ref 101–111)
GFR calc Af Amer: 40 mL/min — ABNORMAL LOW (ref >60)
GFR, EST NON AFRICAN AMERICAN: 35 mL/min — AB (ref >60)
Glucose, Bld: 195 mg/dL — ABNORMAL HIGH (ref 65–99)
Potassium: 4.5 mmol/L (ref 3.5–5.1)
SODIUM: 139 mmol/L (ref 135–145)

## 2015-12-06 LAB — BRAIN NATRIURETIC PEPTIDE: B NATRIURETIC PEPTIDE 5: 378.7 pg/mL — AB (ref 0.0–100.0)

## 2015-12-06 MED ORDER — POTASSIUM CHLORIDE CRYS ER 20 MEQ PO TBCR: EXTENDED_RELEASE_TABLET | ORAL | Status: DC

## 2015-12-06 MED ORDER — TORSEMIDE 20 MG PO TABS: 60.0000 mg | ORAL_TABLET | Freq: Two times a day (BID) | ORAL | Status: DC

## 2015-12-06 NOTE — Progress Notes (Signed)
Advanced Heart Failure Medication Review by a Pharmacist  Does the patient  feel that his/her medications are working for him/her?  yes  Has the patient been experiencing any side effects to the medications prescribed?  no  Does the patient measure his/her own blood pressure or blood glucose at home?  yes   Does the patient have any problems obtaining medications due to transportation or finances?   no  Understanding of regimen: good Understanding of indications: good Potential of compliance: excellent Patient understands to avoid NSAIDs. Patient understands to avoid decongestants.  Issues to address at subsequent visits: None   Pharmacist comments:  [name] is a pleasant 81 yo F presenting with her daughters and an up-to-date medication list. She reports excellent compliance with her medications and states that she did have to take an additional torsemide tablet BID for 2 days last week but has since resumed her normal regimen. She did not have any specific medication-related questions or concerns for me at this time.   [name]. [name], PharmD, BCPS, CPP Clinical Pharmacist Pager: [phone] Phone: [phone] 12/06/2015 10:33 AM      Time with patient: 8 minutes Preparation and documentation time: 2 minutes Total time: 10 minutes

## 2015-12-06 NOTE — Progress Notes (Signed)
Patient ID: [name], female   DOB: 1926/03/11, 81 y.o.   MRN: [id number] PCP: Dr. Doy Hutching Referring: Dr. Melvyn Novas Cardiology: Dr. Aundra Dubin  81 yo with chronic atrial fibrillation on coumadin, chronic diastolic CHF, and OHS presents for cardiology evaluation.  She has had several recent admissions.  In 6/16, she had syncope while sitting on the toilet.  She came to the ER.  Troponin was 0.21 and temperature was up to 101.  She was admitted and ended up having cardiac cath showing no obstructive CAD but 3+ MR.  Echo showed normal EF with grade III diastolic dysfunction, severe pulmonary hypertension, and per report only trivial MR.  She was re-admitted later in 6/16 after a fall and syncope again.  She was found to be profoundly hypoglycemic.  She is no longer on diabetes medications.    She was referred to Dr Melvyn Novas and has been on home oxygen for OHS.  She has been wheelchair-bound for the most part since 6/16.  She used to live alone prior to 6/16 but now lives with her daughter.  Admitted to [facility] 9/16 with marked volume overload. Diuresed with IV lasix and transitioned to torsemide 40 mg mg in am and 20 mg in pm. Had RHC with elevated filling pressures in a restrictive pattern and pulmonary venous htn. Discharge weight was 232 pounds.   Weight is up 13 lbs.  She walks in her house but gets short of breath walking to the bathroom.  She uses the wheelchair outside the house.  She is on 3L oxygen at all times.  She has chronic orthopnea and sleeps in a recliner.  No chest pain.  She is on warfarin, no BRBPR or melena.    Labs (7/16): K 3.7, creatinine 0.93, BNP 370 Labs (9/16): hgb 8.4 Labs (10/16): K 4.3, creatinine 1.41 Labs (11/16): K 3.7, creatinine 1.49, BNP 413  PMH: 1. HTN 2. Type II diabetes: Currently on no meds.  3. Syncope 4. Chronic atrial fibrillation: No history of CVA. 11/16 event monitor showed chronic atrial fibrillation, no other abnormalities. 5. Chronic diastolic CHF: Echo  (123XX123) with EF 55-60%, grade III diastolic dysfunction, PA systolic pressure 76 mmHg, trivial MR.  6. Cardiolite (2/16) with EF 48%, no ischemia.  LHC (6/16) with 20% distal LAD stenosis, EF 55-60%, 3+ MR.  7. Mitral regurgitation: Unclear how significant this is.  Trivial per 6/16 echo but 3+ per 6/16 cath.  8. H/o DVT 9. DCIS s/p lumpectomy and radiation in 2011.   10. OA 11. Hyperlipidemia 12. OHS: On home oxygen.  13. Morbid obesity 14. RHC 08/09/2015 RA mean 29, RV 73/24, PA 74/33, mean 45,PCWP mean 33,Oxygen saturations:PA 49%AO 98% ,CO 5.12 /CI2.27, PVR 2.3 WU  SH: Lives in [place] with family, never smoked.   FH: No premature CAD. +CHF.   ROS: All systems reviewed and negative except as per HPI.   Current Outpatient Prescriptions  Medication Sig Dispense Refill  . acetaminophen (TYLENOL) 500 MG tablet Take 1,000 mg by mouth 2 (two) times daily as needed for mild pain.     Marland Kitchen atorvastatin (LIPITOR) 20 MG tablet Take 20 mg by mouth daily.    Marland Kitchen gabapentin (NEURONTIN) 300 MG capsule Take 300 mg by mouth at bedtime.   3  . metoprolol tartrate (LOPRESSOR) 25 MG tablet Take 1 tablet (25 mg total) by mouth 2 (two) times daily. 180 tablet 3  . pantoprazole (PROTONIX) 40 MG tablet Take 1 tablet (40 mg total) by mouth 2 (two)  times daily before a meal. 60 tablet 3  . potassium chloride SA (KLOR-CON M20) 20 MEQ tablet Take 2 tabs (40 MeQ) in the AM and take 1 tab (20 MEQ) in the PM 90 tablet 6  . torsemide (DEMADEX) 20 MG tablet Take 3 tablets (60 mg total) by mouth 2 (two) times daily. 180 tablet 6  . warfarin (COUMADIN) 2 MG tablet Take 2-4 mg by mouth daily. Take 2 mg daily except 4 mg on Thursday's and and Saturday's.     No current facility-administered medications for this encounter.   BP 98/56 mmHg  Pulse 90  Wt 240 lb 6.4 oz (109.045 kg)  SpO2 94%  General: NAD, obese Arrived in wheel chair . Daughter present .  Neck: JVP 10-11 cm, no thyromegaly or thyroid nodule.  Lungs:  Crackles at bases bilaterally  CV: Nondisplaced PMI.  Heart irregular S1/S2, no S3/S4, 2/6 HSM at apex.  1+ edema 1/2 to knees bilaterally. No carotid bruit.  Normal pedal pulses.  Abdomen: Soft, nontender, no hepatosplenomegaly, no distention.  Skin: Intact without lesions or rashes.  Neurologic: Alert and oriented x 3.  Psych: Normal affect. Extremities: No clubbing or cyanosis.  HEENT: Normal.   Assessment/Plan: 1. Chronic diastolic CHF: NYHA IIIb, not very active.  She is volume overloaded on exam with increased weight.  -  Increase torsemide to 60 mg bid.  Increase KCl to 40 qam/20 qpm.  - Cut back on sodium in diet, 1500 mg/day or less. - BMET/BNP today and again in 10 days.   2. HTN: Stable.   3. Pulmonary hypertension: Likely pulmonary venous hypertension that improved with diuresis. Continue continuous oxygen.  4. Murmur: Moderate TR on echo.   5. Atrial fibrillation: Chronic. Continue rate control/anticoagulation strategy. Remains on warfarin with stable anemia. 6. CKD: Stage III. This has been stable.  7. OHS: On home oxygen.   Followup in 4 wks.   Loralie Champagne 12/06/2015

## 2015-12-06 NOTE — Patient Instructions (Signed)
INCREASE Torsemide to 60 mg (3 tabs) twice a day INCREASE Potassium to 40 mEQ (2 tabs) in the AM and 20 meQ (1 tab) in the PM  Labs today and again in 10-14 days  Your physician recommends that you schedule a follow-up appointment in: 1 month  Do the following things EVERYDAY: 1) Weigh yourself in the morning before breakfast. Write it down and keep it in a log. 2) Take your medicines as prescribed 3) Eat low salt foods-Limit salt (sodium) to 1500 mg per day.  4) Stay as active as you can everyday 5) Limit all fluids for the day to less than 2 liters 6)

## 2015-12-13 DIAGNOSIS — K922 Gastrointestinal hemorrhage, unspecified: Secondary | ICD-10-CM | POA: Diagnosis not present

## 2015-12-13 DIAGNOSIS — I5043 Acute on chronic combined systolic (congestive) and diastolic (congestive) heart failure: Secondary | ICD-10-CM | POA: Diagnosis not present

## 2015-12-13 DIAGNOSIS — K264 Chronic or unspecified duodenal ulcer with hemorrhage: Secondary | ICD-10-CM | POA: Diagnosis not present

## 2015-12-13 DIAGNOSIS — I10 Essential (primary) hypertension: Secondary | ICD-10-CM | POA: Diagnosis not present

## 2015-12-13 DIAGNOSIS — I4891 Unspecified atrial fibrillation: Secondary | ICD-10-CM | POA: Diagnosis not present

## 2015-12-24 ENCOUNTER — Ambulatory Visit (HOSPITAL_COMMUNITY)
Admission: RE | Admit: 2015-12-24 | Discharge: 2015-12-24 | Disposition: A | Discharge status: Home / Self Care | Payer: Commercial Managed Care - HMO | Source: Ambulatory Visit | Attending: Cardiology | Admitting: Cardiology

## 2015-12-24 DIAGNOSIS — I5033 Acute on chronic diastolic (congestive) heart failure: Secondary | ICD-10-CM

## 2015-12-24 LAB — BASIC METABOLIC PANEL
Anion gap: 12 (ref 5–15)
BUN: 23 mg/dL — AB (ref 6–20)
CHLORIDE: 94 mmol/L — AB (ref 101–111)
CO2: 34 mmol/L — AB (ref 22–32)
CREATININE: 1.38 mg/dL — AB (ref 0.44–1.00)
Calcium: 8.9 mg/dL (ref 8.9–10.3)
GFR calc Af Amer: 39 mL/min — ABNORMAL LOW (ref >60)
GFR calc non Af Amer: 33 mL/min — ABNORMAL LOW (ref >60)
GLUCOSE: 168 mg/dL — AB (ref 65–99)
Potassium: 4.1 mmol/L (ref 3.5–5.1)
Sodium: 140 mmol/L (ref 135–145)

## 2015-12-24 LAB — BRAIN NATRIURETIC PEPTIDE: B Natriuretic Peptide: 389.4 pg/mL — ABNORMAL HIGH (ref 0.0–100.0)

## 2016-01-13 DIAGNOSIS — I10 Essential (primary) hypertension: Secondary | ICD-10-CM | POA: Diagnosis not present

## 2016-01-13 DIAGNOSIS — I5043 Acute on chronic combined systolic (congestive) and diastolic (congestive) heart failure: Secondary | ICD-10-CM | POA: Diagnosis not present

## 2016-01-13 DIAGNOSIS — K264 Chronic or unspecified duodenal ulcer with hemorrhage: Secondary | ICD-10-CM | POA: Diagnosis not present

## 2016-01-13 DIAGNOSIS — I4891 Unspecified atrial fibrillation: Secondary | ICD-10-CM | POA: Diagnosis not present

## 2016-01-13 DIAGNOSIS — K922 Gastrointestinal hemorrhage, unspecified: Secondary | ICD-10-CM | POA: Diagnosis not present

## 2016-01-17 ENCOUNTER — Encounter (HOSPITAL_COMMUNITY): Payer: Self-pay

## 2016-01-17 ENCOUNTER — Ambulatory Visit
Admission: RE | Admit: 2016-01-17 | Discharge: 2016-01-17 | Disposition: A | Discharge status: Home / Self Care | Payer: Commercial Managed Care - HMO | Source: Ambulatory Visit | Attending: Oncology | Admitting: Oncology

## 2016-01-17 ENCOUNTER — Encounter (HOSPITAL_COMMUNITY): Payer: Commercial Managed Care - HMO

## 2016-01-17 ENCOUNTER — Ambulatory Visit (HOSPITAL_COMMUNITY)
Admission: RE | Admit: 2016-01-17 | Discharge: 2016-01-17 | Disposition: A | Discharge status: Home / Self Care | Payer: Commercial Managed Care - HMO | Source: Ambulatory Visit | Attending: Cardiology | Admitting: Cardiology

## 2016-01-17 VITALS — BP 120/66 | HR 88 | Wt 234.0 lb

## 2016-01-17 DIAGNOSIS — N183 Chronic kidney disease, stage 3 (moderate): Secondary | ICD-10-CM | POA: Insufficient documentation

## 2016-01-17 DIAGNOSIS — R42 Dizziness and giddiness: Secondary | ICD-10-CM | POA: Diagnosis not present

## 2016-01-17 DIAGNOSIS — M1990 Unspecified osteoarthritis, unspecified site: Secondary | ICD-10-CM | POA: Insufficient documentation

## 2016-01-17 DIAGNOSIS — Z9981 Dependence on supplemental oxygen: Secondary | ICD-10-CM | POA: Insufficient documentation

## 2016-01-17 DIAGNOSIS — E662 Morbid (severe) obesity with alveolar hypoventilation: Secondary | ICD-10-CM | POA: Diagnosis not present

## 2016-01-17 DIAGNOSIS — I5033 Acute on chronic diastolic (congestive) heart failure: Secondary | ICD-10-CM

## 2016-01-17 DIAGNOSIS — Z853 Personal history of malignant neoplasm of breast: Secondary | ICD-10-CM

## 2016-01-17 DIAGNOSIS — I5032 Chronic diastolic (congestive) heart failure: Secondary | ICD-10-CM

## 2016-01-17 DIAGNOSIS — Z87898 Personal history of other specified conditions: Secondary | ICD-10-CM | POA: Insufficient documentation

## 2016-01-17 DIAGNOSIS — I482 Chronic atrial fibrillation: Secondary | ICD-10-CM | POA: Insufficient documentation

## 2016-01-17 DIAGNOSIS — I272 Other secondary pulmonary hypertension: Secondary | ICD-10-CM | POA: Diagnosis not present

## 2016-01-17 DIAGNOSIS — R928 Other abnormal and inconclusive findings on diagnostic imaging of breast: Secondary | ICD-10-CM | POA: Diagnosis not present

## 2016-01-17 DIAGNOSIS — E7800 Pure hypercholesterolemia, unspecified: Secondary | ICD-10-CM | POA: Diagnosis not present

## 2016-01-17 DIAGNOSIS — D649 Anemia, unspecified: Secondary | ICD-10-CM | POA: Diagnosis not present

## 2016-01-17 DIAGNOSIS — Z7901 Long term (current) use of anticoagulants: Secondary | ICD-10-CM | POA: Insufficient documentation

## 2016-01-17 DIAGNOSIS — Z79899 Other long term (current) drug therapy: Secondary | ICD-10-CM | POA: Insufficient documentation

## 2016-01-17 DIAGNOSIS — R011 Cardiac murmur, unspecified: Secondary | ICD-10-CM | POA: Insufficient documentation

## 2016-01-17 DIAGNOSIS — I2729 Other secondary pulmonary hypertension: Secondary | ICD-10-CM

## 2016-01-17 DIAGNOSIS — Z86718 Personal history of other venous thrombosis and embolism: Secondary | ICD-10-CM | POA: Insufficient documentation

## 2016-01-17 DIAGNOSIS — E785 Hyperlipidemia, unspecified: Secondary | ICD-10-CM | POA: Insufficient documentation

## 2016-01-17 DIAGNOSIS — I130 Hypertensive heart and chronic kidney disease with heart failure and stage 1 through stage 4 chronic kidney disease, or unspecified chronic kidney disease: Secondary | ICD-10-CM | POA: Insufficient documentation

## 2016-01-17 DIAGNOSIS — E1122 Type 2 diabetes mellitus with diabetic chronic kidney disease: Secondary | ICD-10-CM | POA: Insufficient documentation

## 2016-01-17 MED ORDER — TORSEMIDE 20 MG PO TABS
ORAL_TABLET | ORAL | Status: DC
Start: 2016-01-17 — End: 2016-05-30

## 2016-01-17 NOTE — Progress Notes (Signed)
Patient ID: [name], female   DOB: 1926-11-04, 82 y.o.   MRN: [id number] PCP: Dr. Doy Hutching Referring: Dr. Melvyn Novas Cardiology: Dr. Aundra Dubin  82 yo with chronic atrial fibrillation on coumadin, chronic diastolic CHF, and OHS presents for cardiology evaluation.  She has had several recent admissions.  In 6/16, she had syncope while sitting on the toilet.  She came to the ER.  Troponin was 0.21 and temperature was up to 101.  She was admitted and ended up having cardiac cath showing no obstructive CAD but 3+ MR.  Echo showed normal EF with grade III diastolic dysfunction, severe pulmonary hypertension, and per report only trivial MR.  She was re-admitted later in 6/16 after a fall and syncope again.  She was found to be profoundly hypoglycemic.  She is no longer on diabetes medications.    She was referred to Dr Melvyn Novas and has been on home oxygen for OHS.  She has been wheelchair-bound for the most part since 6/16.  She used to live alone prior to 6/16 but now lives with her daughter.  Admitted to [facility] 9/16 with marked volume overload. Diuresed with IV lasix and transitioned to torsemide 40 mg mg in am and 20 mg in pm. Had RHC with elevated filling pressures in a restrictive pattern and pulmonary venous htn. Discharge weight was 232 pounds.   Torsemide was increased at last appointment, and weight is down 6 lbs. She is able to walk in the house without dyspnea.  Mild dyspnea going up and down steps.  She has had episodes of unresponsiveness/lightheadedness while standing.  She uses the wheelchair outside the house.  She is on 3L oxygen at all times.  She has chronic orthopnea and sleeps in a recliner.  No chest pain.  She is on warfarin, no BRBPR or melena.    Labs (7/16): K 3.7, creatinine 0.93, BNP 370 Labs (9/16): hgb 8.4 Labs (10/16): K 4.3, creatinine 1.41 Labs (11/16): K 3.7, creatinine 1.49, BNP 413 Labs (1/17): K 4.1, creatinine 1.38, BNP 389  PMH: 1. HTN 2. Type II diabetes: Currently on no  meds.  3. Syncope 4. Chronic atrial fibrillation: No history of CVA. 11/16 event monitor showed chronic atrial fibrillation, no other abnormalities. 5. Chronic diastolic CHF: Echo (123XX123) with EF 55-60%, grade III diastolic dysfunction, PA systolic pressure 76 mmHg, trivial MR. RHC 08/09/2015 RA mean 29, RV 73/24, PA 74/33, mean 45,PCWP mean 33,Oxygen saturations:PA 49%AO 98% ,CO 5.12 /CI2.27, PVR 2.3 WU. 6. Cardiolite (2/16) with EF 48%, no ischemia.  LHC (6/16) with 20% distal LAD stenosis, EF 55-60%, 3+ MR.  7. Mitral regurgitation: Unclear how significant this is.  Trivial per 6/16 echo but 3+ per 6/16 cath.  8. H/o DVT 9. DCIS s/p lumpectomy and radiation in 2011.   10. OA 11. Hyperlipidemia 12. OHS: On home oxygen.  81. Morbid obesity   SH: Lives in [place] with family, never smoked.   FH: No premature CAD. +CHF.   ROS: All systems reviewed and negative except as per HPI.   Current Outpatient Prescriptions  Medication Sig Dispense Refill  . acetaminophen (TYLENOL) 500 MG tablet Take 1,000 mg by mouth 2 (two) times daily as needed for mild pain.     Marland Kitchen atorvastatin (LIPITOR) 20 MG tablet Take 20 mg by mouth daily.    Marland Kitchen gabapentin (NEURONTIN) 300 MG capsule Take 300 mg by mouth at bedtime.   3  . metoprolol tartrate (LOPRESSOR) 25 MG tablet Take 1 tablet (25 mg total)  by mouth 2 (two) times daily. 180 tablet 3  . pantoprazole (PROTONIX) 40 MG tablet Take 1 tablet (40 mg total) by mouth 2 (two) times daily before a meal. 60 tablet 3  . potassium chloride SA (KLOR-CON M20) 20 MEQ tablet Take 2 tabs (40 MeQ) in the AM and take 1 tab (20 MEQ) in the PM 90 tablet 6  . torsemide (DEMADEX) 20 MG tablet Take 80 mg (4 tabs) in am and 60mg  (3 tabs) in pm 360 tablet 6  . warfarin (COUMADIN) 2 MG tablet Take 2-4 mg by mouth daily. Take 2 mg daily except 4 mg on Thursday's and and Saturday's.     No current facility-administered medications for this encounter.   BP 120/66 mmHg  Pulse 88  Wt  234 lb (106.142 kg)  SpO2 98%  General: NAD, obese Arrived in wheel chair . Daughter present .  Neck: JVP 8-9 cm, no thyromegaly or thyroid nodule.  Lungs: Crackles at bases bilaterally  CV: Nondisplaced PMI.  Heart irregular S1/S2, no S3/S4, 2/6 HSM at apex.  Trace ankle edema bilaterally. No carotid bruit.  Normal pedal pulses.  Abdomen: Soft, nontender, no hepatosplenomegaly, no distention.  Skin: Intact without lesions or rashes.  Neurologic: Alert and oriented x 3.  Psych: Normal affect. Extremities: No clubbing or cyanosis.  HEENT: Normal.   Assessment/Plan: 1. Chronic diastolic CHF: NYHA class III symptoms, not very active.  She still has mild volume overload, but looks better than at last appointment. -  Increase torsemide to 80 qam/60 qpm.  BMET/BNP in 1 week.    2. HTN: Stable.   3. Pulmonary hypertension: Likely pulmonary venous hypertension that improved with diuresis. Continue continuous oxygen.  4. Murmur: Moderate TR on echo.   5. Atrial fibrillation: Chronic. Continue rate control/anticoagulation strategy. Remains on warfarin with stable anemia. 6. CKD: Stage III. This has been stable.  7. OHS: On home oxygen.  8. Episodes of lightheadedness: ?orthostasis.  She is only on a low dose of metoprolol for rate control. Would have her try wearing graded compression stockings to see if this helps.   Followup in 6 wks.   Loralie Champagne 01/17/2016

## 2016-01-17 NOTE — Patient Instructions (Signed)
INCREASE Torsemide: Take 80 mg (4 tabs) in am and 60mg  (3 tabs) in pm  Wear compression stockings continuously. Remove for bathing.  Return in 1 week for labs.  Follow up 6 weeks.  Do the following things EVERYDAY: 1) Weigh yourself in the morning before breakfast. Write it down and keep it in a log. 2) Take your medicines as prescribed 3) Eat low salt foods-Limit salt (sodium) to 2000 mg per day.  4) Stay as active as you can everyday 5) Limit all fluids for the day to less than 2 liters

## 2016-01-23 ENCOUNTER — Other Ambulatory Visit: Payer: Self-pay | Admitting: Cardiology

## 2016-01-24 DIAGNOSIS — E1142 Type 2 diabetes mellitus with diabetic polyneuropathy: Secondary | ICD-10-CM | POA: Diagnosis not present

## 2016-01-24 DIAGNOSIS — Z0000 Encounter for general adult medical examination without abnormal findings: Secondary | ICD-10-CM | POA: Diagnosis not present

## 2016-01-24 DIAGNOSIS — H6122 Impacted cerumen, left ear: Secondary | ICD-10-CM | POA: Diagnosis not present

## 2016-01-24 DIAGNOSIS — I1310 Hypertensive heart and chronic kidney disease without heart failure, with stage 1 through stage 4 chronic kidney disease, or unspecified chronic kidney disease: Secondary | ICD-10-CM | POA: Diagnosis not present

## 2016-01-24 DIAGNOSIS — N183 Chronic kidney disease, stage 3 (moderate): Secondary | ICD-10-CM | POA: Diagnosis not present

## 2016-01-24 DIAGNOSIS — Z79899 Other long term (current) drug therapy: Secondary | ICD-10-CM | POA: Diagnosis not present

## 2016-02-10 DIAGNOSIS — K922 Gastrointestinal hemorrhage, unspecified: Secondary | ICD-10-CM | POA: Diagnosis not present

## 2016-02-10 DIAGNOSIS — K264 Chronic or unspecified duodenal ulcer with hemorrhage: Secondary | ICD-10-CM | POA: Diagnosis not present

## 2016-02-10 DIAGNOSIS — I4891 Unspecified atrial fibrillation: Secondary | ICD-10-CM | POA: Diagnosis not present

## 2016-02-10 DIAGNOSIS — I5043 Acute on chronic combined systolic (congestive) and diastolic (congestive) heart failure: Secondary | ICD-10-CM | POA: Diagnosis not present

## 2016-02-10 DIAGNOSIS — I10 Essential (primary) hypertension: Secondary | ICD-10-CM | POA: Diagnosis not present

## 2016-02-28 ENCOUNTER — Ambulatory Visit (HOSPITAL_COMMUNITY)
Admission: RE | Admit: 2016-02-28 | Discharge: 2016-02-28 | Disposition: A | Discharge status: Home / Self Care | Payer: Commercial Managed Care - HMO | Source: Ambulatory Visit | Attending: Cardiology | Admitting: Cardiology

## 2016-02-28 ENCOUNTER — Encounter (HOSPITAL_COMMUNITY): Payer: Self-pay

## 2016-02-28 VITALS — BP 122/72 | HR 87 | Wt 234.0 lb

## 2016-02-28 DIAGNOSIS — Z87898 Personal history of other specified conditions: Secondary | ICD-10-CM | POA: Insufficient documentation

## 2016-02-28 DIAGNOSIS — N183 Chronic kidney disease, stage 3 (moderate): Secondary | ICD-10-CM | POA: Diagnosis not present

## 2016-02-28 DIAGNOSIS — E662 Morbid (severe) obesity with alveolar hypoventilation: Secondary | ICD-10-CM | POA: Insufficient documentation

## 2016-02-28 DIAGNOSIS — I5032 Chronic diastolic (congestive) heart failure: Secondary | ICD-10-CM | POA: Diagnosis not present

## 2016-02-28 DIAGNOSIS — Z923 Personal history of irradiation: Secondary | ICD-10-CM | POA: Insufficient documentation

## 2016-02-28 DIAGNOSIS — E785 Hyperlipidemia, unspecified: Secondary | ICD-10-CM | POA: Diagnosis not present

## 2016-02-28 DIAGNOSIS — Z86718 Personal history of other venous thrombosis and embolism: Secondary | ICD-10-CM | POA: Diagnosis not present

## 2016-02-28 DIAGNOSIS — I482 Chronic atrial fibrillation, unspecified: Secondary | ICD-10-CM

## 2016-02-28 DIAGNOSIS — Z7901 Long term (current) use of anticoagulants: Secondary | ICD-10-CM

## 2016-02-28 DIAGNOSIS — I110 Hypertensive heart disease with heart failure: Secondary | ICD-10-CM

## 2016-02-28 DIAGNOSIS — E1122 Type 2 diabetes mellitus with diabetic chronic kidney disease: Secondary | ICD-10-CM | POA: Diagnosis not present

## 2016-02-28 DIAGNOSIS — M1990 Unspecified osteoarthritis, unspecified site: Secondary | ICD-10-CM | POA: Insufficient documentation

## 2016-02-28 DIAGNOSIS — I130 Hypertensive heart and chronic kidney disease with heart failure and stage 1 through stage 4 chronic kidney disease, or unspecified chronic kidney disease: Secondary | ICD-10-CM | POA: Diagnosis not present

## 2016-02-28 DIAGNOSIS — I071 Rheumatic tricuspid insufficiency: Secondary | ICD-10-CM | POA: Insufficient documentation

## 2016-02-28 DIAGNOSIS — Z79899 Other long term (current) drug therapy: Secondary | ICD-10-CM | POA: Diagnosis not present

## 2016-02-28 DIAGNOSIS — I272 Other secondary pulmonary hypertension: Secondary | ICD-10-CM | POA: Diagnosis not present

## 2016-02-28 DIAGNOSIS — Z9981 Dependence on supplemental oxygen: Secondary | ICD-10-CM | POA: Diagnosis not present

## 2016-02-28 LAB — BASIC METABOLIC PANEL
ANION GAP: 8 (ref 5–15)
BUN: 30 mg/dL — ABNORMAL HIGH (ref 6–20)
CO2: 34 mmol/L — ABNORMAL HIGH (ref 22–32)
Calcium: 8.9 mg/dL (ref 8.9–10.3)
Chloride: 94 mmol/L — ABNORMAL LOW (ref 101–111)
Creatinine, Ser: 1.45 mg/dL — ABNORMAL HIGH (ref 0.44–1.00)
GFR, EST AFRICAN AMERICAN: 36 mL/min — AB (ref >60)
GFR, EST NON AFRICAN AMERICAN: 31 mL/min — AB (ref >60)
GLUCOSE: 132 mg/dL — AB (ref 65–99)
POTASSIUM: 4.9 mmol/L (ref 3.5–5.1)
SODIUM: 136 mmol/L (ref 135–145)

## 2016-02-28 LAB — CBC
HCT: 31 % — ABNORMAL LOW (ref 36.0–46.0)
Hemoglobin: 9.3 g/dL — ABNORMAL LOW (ref 12.0–15.0)
MCH: 29.7 pg (ref 26.0–34.0)
MCHC: 30 g/dL (ref 30.0–36.0)
MCV: 99 fL (ref 78.0–100.0)
PLATELETS: 164 10*3/uL (ref 150–400)
RBC: 3.13 MIL/uL — AB (ref 3.87–5.11)
RDW: 12.7 % (ref 11.5–15.5)
WBC: 6.8 10*3/uL (ref 4.0–10.5)

## 2016-02-28 LAB — PROTIME-INR
INR: 2.23 — AB (ref 0.00–1.49)
PROTHROMBIN TIME: 24.5 s — AB (ref 11.6–15.2)

## 2016-02-28 NOTE — Progress Notes (Signed)
Patient ID: [name], female   DOB: July 06, 1926, 82 y.o.   MRN: [id number] PCP: Dr. Doy Hutching Referring: Dr. Melvyn Novas Cardiology: Dr. Aundra Dubin  82 yo with chronic atrial fibrillation on coumadin, chronic diastolic CHF, and OHS presents for cardiology evaluation.  She has had several recent admissions.  In 6/16, she had syncope while sitting on the toilet.  She came to the ER.  Troponin was 0.21 and temperature was up to 101.  She was admitted and ended up having cardiac cath showing no obstructive CAD but 3+ MR.  Echo showed normal EF with grade III diastolic dysfunction, severe pulmonary hypertension, and per report only trivial MR.  She was re-admitted later in 6/16 after a fall and syncope again.  She was found to be profoundly hypoglycemic.  She is no longer on diabetes medications.    She was referred to Dr Melvyn Novas and has been on home oxygen for OHS.  She has been wheelchair-bound for the most part since 6/16.  She used to live alone prior to 6/16 but now lives with her daughter.  Admitted to [facility] 9/16 with marked volume overload. Diuresed with IV lasix and transitioned to torsemide 40 mg mg in am and 20 mg in pm. Had RHC with elevated filling pressures in a restrictive pattern and pulmonary venous htn. Discharge weight was 232 pounds.   She returns for HF follow up. Last visit torsemide was increase to 80/60 mg. Weight at home 230-34 pounds. Requires wheel chair for appointments. In the house she uses a walking cane . Wears 3 liters continuously. Taking all medications.  Want to have all teeth pulled and R cataract surgery.   Labs (7/16): K 3.7, creatinine 0.93, BNP 370 Labs (9/16): hgb 8.4 Labs (10/16): K 4.3, creatinine 1.41 Labs (11/16): K 3.7, creatinine 1.49, BNP 413 Labs (1/17): K 4.1, creatinine 1.38, BNP 389   PMH: 1. HTN 2. Type II diabetes: Currently on no meds.  3. Syncope 4. Chronic atrial fibrillation: No history of CVA. 11/16 event monitor showed chronic atrial fibrillation, no  other abnormalities. 5. Chronic diastolic CHF: Echo (123XX123) with EF 55-60%, grade III diastolic dysfunction, PA systolic pressure 76 mmHg, trivial MR. RHC 08/09/2015 RA mean 29, RV 73/24, PA 74/33, mean 45,PCWP mean 33,Oxygen saturations:PA 49%AO 98% ,CO 5.12 /CI2.27, PVR 2.3 WU. 6. Cardiolite (2/16) with EF 48%, no ischemia.  LHC (6/16) with 20% distal LAD stenosis, EF 55-60%, 3+ MR.  7. Mitral regurgitation: Unclear how significant this is.  Trivial per 6/16 echo but 3+ per 6/16 cath.  8. H/o DVT 9. DCIS s/p lumpectomy and radiation in 2011.   10. OA 11. Hyperlipidemia 12. OHS: On home oxygen.  74. Morbid obesity   SH: Lives in [place] with family, never smoked.   FH: No premature CAD. +CHF.   ROS: All systems reviewed and negative except as per HPI.   Current Outpatient Prescriptions  Medication Sig Dispense Refill  . acetaminophen (TYLENOL) 500 MG tablet Take 1,000 mg by mouth 2 (two) times daily as needed for mild pain.     Marland Kitchen atorvastatin (LIPITOR) 20 MG tablet Take 20 mg by mouth daily.    Marland Kitchen gabapentin (NEURONTIN) 300 MG capsule Take 300 mg by mouth at bedtime.   3  . metoprolol tartrate (LOPRESSOR) 25 MG tablet Take 1 tablet (25 mg total) by mouth 2 (two) times daily. 180 tablet 3  . pantoprazole (PROTONIX) 40 MG tablet Take 1 tablet (40 mg total) by mouth 2 (two) times daily  before a meal. 60 tablet 3  . potassium chloride SA (KLOR-CON M20) 20 MEQ tablet Take 2 tabs (40 MeQ) in the AM and take 1 tab (20 MEQ) in the PM 90 tablet 6  . torsemide (DEMADEX) 20 MG tablet Take 80 mg (4 tabs) in am and 60mg  (3 tabs) in pm 360 tablet 6  . warfarin (COUMADIN) 2 MG tablet TAKE 1 TABLET BY MOUTH FOR 5 DAYS A WEEK, THEN 2 TABLETS ON THURSDAY AND SATURDAY 120 tablet 3   No current facility-administered medications for this encounter.   BP 122/72 mmHg  Pulse 87  Wt 234 lb (106.142 kg)  SpO2 100%  General: NAD, obese Arrived in wheel chair . Daughter present .  Neck: JVP 6-7 cm, no  thyromegaly or thyroid nodule.  Lungs: Clear on 3 liters.   CV: Nondisplaced PMI.  Heart irregular S1/S2, no S3/S4, 2/6 HSM at apex.  Trace ankle edema bilaterally. No carotid bruit.  Normal pedal pulses.  Abdomen: Soft, nontender, no hepatosplenomegaly, no distention.  Skin: Intact without lesions or rashes.  Neurologic: Alert and oriented x 3.  Psych: Normal affect. Extremities: No clubbing or cyanosis.  HEENT: Normal.   Assessment/Plan: 1. Chronic diastolic CHF: NYHA class III symptoms, not very active.   Volume status stable. Continue torsemide to 80 qam/60 qpm.   2. HTN: Stable.   3. Pulmonary hypertension: Likely pulmonary venous hypertension that improved with diuresis. Continue continuous oxygen.  4. Murmur: Moderate TR on echo.   5. Atrial fibrillation: Chronic. Continue rate control/anticoagulation strategy. Remains on warfarin . Check CBC/INR. Refer to Coumadin Clinic.  6. CKD: Stage III. This has been stable. BMET today.  7. OHS: On home oxygen 3 liters  8. Episodes of lightheadedness: Rarely.   Refer to Coumadin Clinic for INR management.   Follow up in 6 wks.   Nai Borromeo CleggNP-C  02/28/2016

## 2016-02-28 NOTE — Patient Instructions (Signed)
Routine lab work today. Will notify you of abnormal results, otherwise no news is good news!  Will refer you to Coumadin Clinic at [facility]. Address: [address] ([place]), [place], [place] [address]  Phone: [phone]  Follow up 6-8 weeks with Darrick Grinder, Certified Nurse Practicioner.  Do the following things EVERYDAY: 1) Weigh yourself in the morning before breakfast. Write it down and keep it in a log. 2) Take your medicines as prescribed 3) Eat low salt foods-Limit salt (sodium) to 2000 mg per day.  4) Stay as active as you can everyday 5) Limit all fluids for the day to less than 2 liters

## 2016-02-29 ENCOUNTER — Ambulatory Visit (INDEPENDENT_AMBULATORY_CARE_PROVIDER_SITE_OTHER): Payer: Commercial Managed Care - HMO | Admitting: Cardiology

## 2016-02-29 DIAGNOSIS — I482 Chronic atrial fibrillation, unspecified: Secondary | ICD-10-CM

## 2016-02-29 DIAGNOSIS — I5032 Chronic diastolic (congestive) heart failure: Secondary | ICD-10-CM

## 2016-03-06 ENCOUNTER — Ambulatory Visit (INDEPENDENT_AMBULATORY_CARE_PROVIDER_SITE_OTHER): Payer: Commercial Managed Care - HMO | Admitting: *Deleted

## 2016-03-06 DIAGNOSIS — I482 Chronic atrial fibrillation, unspecified: Secondary | ICD-10-CM

## 2016-03-06 DIAGNOSIS — I5032 Chronic diastolic (congestive) heart failure: Secondary | ICD-10-CM

## 2016-03-06 LAB — POCT INR: INR: 1.9

## 2016-03-12 DIAGNOSIS — I4891 Unspecified atrial fibrillation: Secondary | ICD-10-CM | POA: Diagnosis not present

## 2016-03-12 DIAGNOSIS — I10 Essential (primary) hypertension: Secondary | ICD-10-CM | POA: Diagnosis not present

## 2016-03-12 DIAGNOSIS — K264 Chronic or unspecified duodenal ulcer with hemorrhage: Secondary | ICD-10-CM | POA: Diagnosis not present

## 2016-03-12 DIAGNOSIS — K922 Gastrointestinal hemorrhage, unspecified: Secondary | ICD-10-CM | POA: Diagnosis not present

## 2016-03-12 DIAGNOSIS — I5043 Acute on chronic combined systolic (congestive) and diastolic (congestive) heart failure: Secondary | ICD-10-CM | POA: Diagnosis not present

## 2016-03-17 ENCOUNTER — Encounter: Payer: Self-pay | Admitting: Cardiology

## 2016-03-20 ENCOUNTER — Ambulatory Visit (INDEPENDENT_AMBULATORY_CARE_PROVIDER_SITE_OTHER): Payer: Commercial Managed Care - HMO | Admitting: *Deleted

## 2016-03-20 DIAGNOSIS — I482 Chronic atrial fibrillation, unspecified: Secondary | ICD-10-CM

## 2016-03-20 DIAGNOSIS — I5032 Chronic diastolic (congestive) heart failure: Secondary | ICD-10-CM

## 2016-03-20 LAB — POCT INR: INR: 2.8

## 2016-03-25 IMAGING — DX DG CHEST 2V
2 series · 2 of 2 positions shown · non-contrast
Comparison: PA and lateral chest 06/08/2015, 10/31/2013 and
08/28/2012.

CLINICAL DATA: Fever today.  The patient feels ill.

EXAM:
CHEST  2 VIEW

[x chest ap]
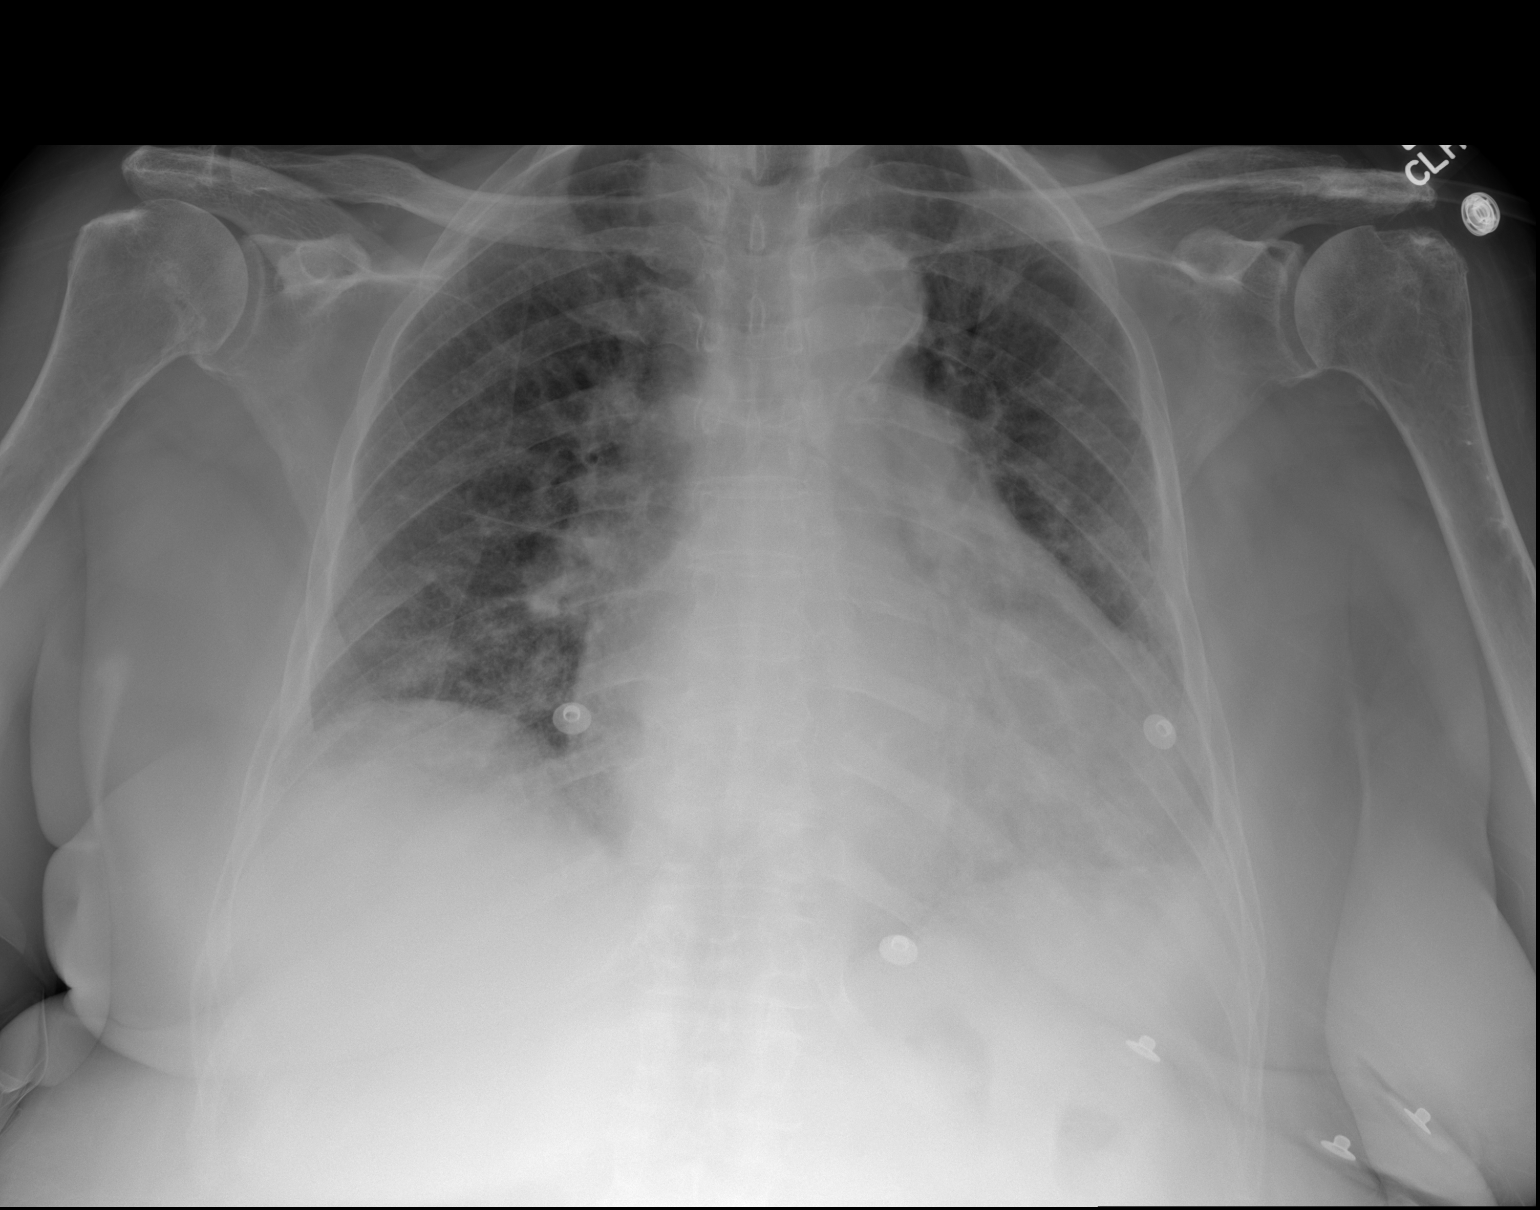

[w chest lat]
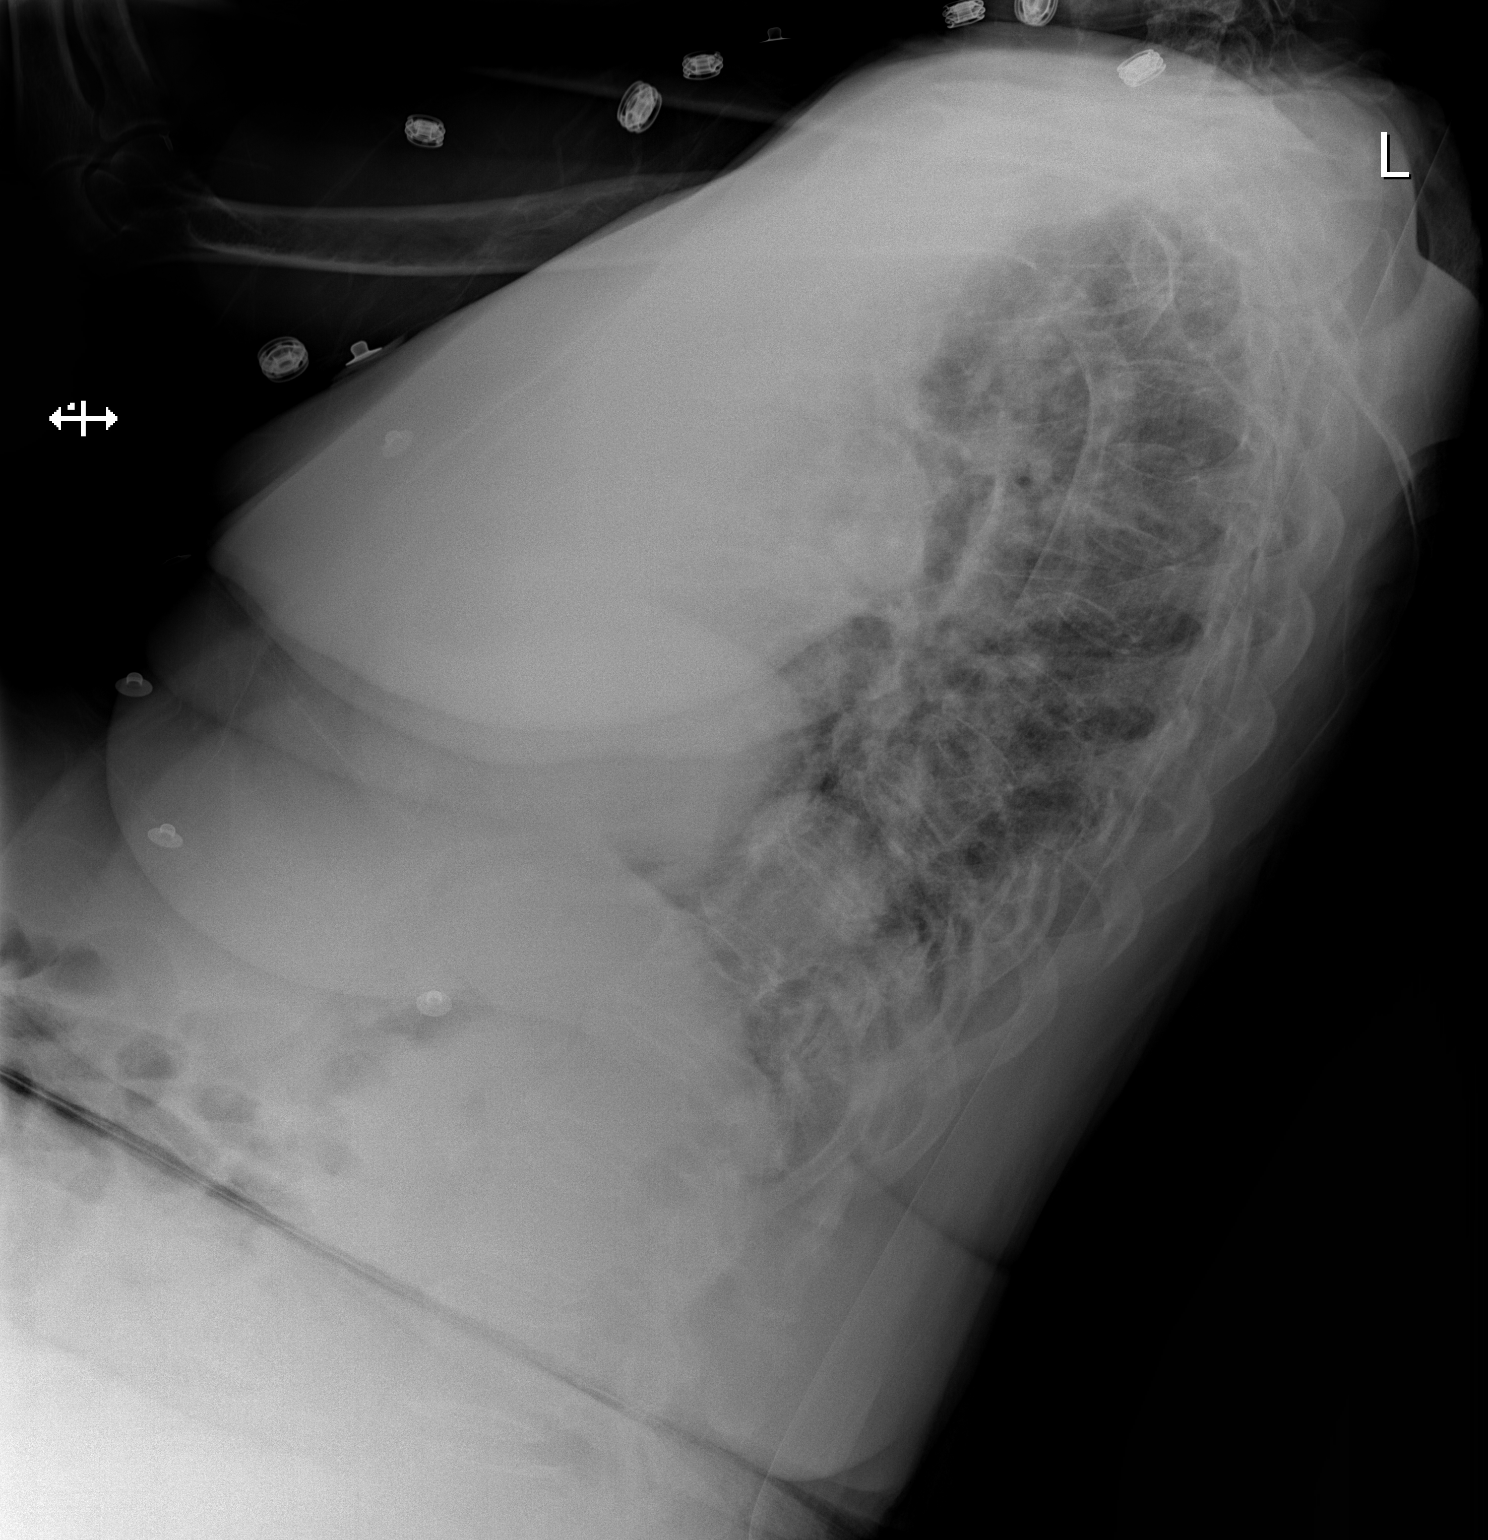

[2 of 2 positions shown; findings below may reference images not displayed]

FINDINGS: There is cardiomegaly and pulmonary vascular congestion. No
consolidative process or pneumothorax is identified. There may be a
very small left pleural effusion. Aortic atherosclerosis is noted.
IMPRESSION: Very small left pleural effusion.  No evidence of pneumonia.

Cardiomegaly and chronic pulmonary vascular congestion.

## 2016-04-04 ENCOUNTER — Telehealth: Payer: Self-pay | Admitting: Hematology

## 2016-04-04 NOTE — Telephone Encounter (Signed)
cld & spoke to pt daughter and gave r/s appt time & date

## 2016-04-09 NOTE — Progress Notes (Signed)
Patient ID: [name], female   DOB: 1926-10-31, 82 y.o.   MRN: [id number]    Advanced Heart Failure Clinic Note   PCP: Dr. Doy Hutching Referring: Dr. Melvyn Novas Cardiology: Dr. Aundra Dubin  82 yo with chronic atrial fibrillation on coumadin, chronic diastolic CHF, and OHS presents for cardiology evaluation.  She has had several recent admissions.  In 6/16, she had syncope while sitting on the toilet.  She came to the ER.  Troponin was 0.21 and temperature was up to 101.  She was admitted and ended up having cardiac cath showing no obstructive CAD but 3+ MR.  Echo showed normal EF with grade III diastolic dysfunction, severe pulmonary hypertension, and per report only trivial MR.  She was re-admitted later in 6/16 after a fall and syncope again.  She was found to be profoundly hypoglycemic.  She is no longer on diabetes medications.    She was referred to Dr Melvyn Novas and has been on home oxygen for OHS.  She has been wheelchair-bound for the most part since 6/16.  She used to live alone prior to 6/16 but now lives with her daughter.  Ad[facility] 9/16 with marked volume overload. Diuresed with IV lasix and transitioned to torsemide 40 mg mg in am and 20 mg in pm. Had RHC with elevated filling pressures in a restrictive pattern and pulmonary venous htn. Discharge weight was 232 pounds.   She returns for HF follow up. Stable at last visit. She is up 6 lbs from last visit, but doesn't feel overload. Appetite has increased per daughter. Gets around the house OK with walking cane.  No SOB with changing clothes.  Does get SOB if she walks from one end of the house to the other.  Weight at home 232-36 pounds. Wears O2 3 liters continuously. Taking all medications.  Still planning on having R cataract surgery and having all teeth pulled for full dentures. No bleeding on coumadin. Lightheadedness has improved, although she is not very active.   Labs (7/16): K 3.7, creatinine 0.93, BNP 370 Labs (9/16): hgb 8.4 Labs  (10/16): K 4.3, creatinine 1.41 Labs (11/16): K 3.7, creatinine 1.49, BNP 413 Labs (1/17): K 4.1, creatinine 1.38, BNP 389 Labs (3/17): K 4.9, creatinine 1.45  PMH: 1. HTN 2. Type II diabetes: Currently on no meds.  3. Syncope 4. Chronic atrial fibrillation: No history of CVA. 11/16 event monitor showed chronic atrial fibrillation, no other abnormalities. 5. Chronic diastolic CHF: Echo (123XX123) with EF 55-60%, grade III diastolic dysfunction, PA systolic pressure 76 mmHg, trivial MR. RHC 08/09/2015 RA mean 29, RV 73/24, PA 74/33, mean 45,PCWP mean 33,Oxygen saturations:PA 49%AO 98% ,CO 5.12 /CI2.27, PVR 2.3 WU. 6. Cardiolite (2/16) with EF 48%, no ischemia.  LHC (6/16) with 20% distal LAD stenosis, EF 55-60%, 3+ MR.  7. Mitral regurgitation: Unclear how significant this is.  Trivial per 6/16 echo but 3+ per 6/16 cath.  8. H/o DVT 9. DCIS s/p lumpectomy and radiation in 2011.   10. OA 11. Hyperlipidemia 12. OHS: On home oxygen.  24. Morbid obesity   SH: Lives in [place] with family, never smoked.   FH: No premature CAD. +CHF.   ROS: All systems reviewed and negative except as per HPI.   Current Outpatient Prescriptions  Medication Sig Dispense Refill  . acetaminophen (TYLENOL) 500 MG tablet Take 1,000 mg by mouth 2 (two) times daily as needed for mild pain.     Marland Kitchen atorvastatin (LIPITOR) 20 MG tablet Take 20 mg  by mouth daily.    Marland Kitchen gabapentin (NEURONTIN) 300 MG capsule Take 300 mg by mouth at bedtime.   3  . metoprolol tartrate (LOPRESSOR) 25 MG tablet Take 1 tablet (25 mg total) by mouth 2 (two) times daily. 180 tablet 3  . pantoprazole (PROTONIX) 40 MG tablet Take 1 tablet (40 mg total) by mouth 2 (two) times daily before a meal. 60 tablet 3  . potassium chloride SA (KLOR-CON M20) 20 MEQ tablet Take 2 tabs (40 MeQ) in the AM and take 1 tab (20 MEQ) in the PM 90 tablet 6  . torsemide (DEMADEX) 20 MG tablet Take 80 mg (4 tabs) in am and 60mg  (3 tabs) in pm 360 tablet 6  . warfarin  (COUMADIN) 2 MG tablet TAKE 1 TABLET BY MOUTH FOR 5 DAYS A WEEK, THEN 2 TABLETS ON THURSDAY AND SATURDAY 120 tablet 3   No current facility-administered medications for this encounter.   BP 116/72 mmHg  Pulse 80  Wt 240 lb (108.863 kg)  SpO2 95%  Wt Readings from Last 3 Encounters:  04/10/16 240 lb (108.863 kg)  02/28/16 234 lb (106.142 kg)  01/17/16 234 lb (106.142 kg)     General: NAD, obese Arrived in wheel chair . Daughter present .  Neck: JVP 6-7 cm, no thyromegaly or thyroid nodule.  Lungs: Clear on 3 liters.   CV: Nondisplaced PMI.  Heart irregular S1/S2, no S3/S4, 2/6 HSM at apex.  No edema. No carotid bruit.  Normal pedal pulses.  Abdomen: Obese, soft, NT, ND, no HSM. No bruits or masses. +BS  Skin: Intact without lesions or rashes.  Neurologic: Alert and oriented x 3.  Psych: Normal affect. Extremities: No clubbing or cyanosis.  HEENT: Normal.   Assessment/Plan: 1. Chronic diastolic CHF: Echo [id number] LVEF 55-60%, RV mod reduced - NYHA class III symptoms, not very active.   Volume status appears stable despite several lbs up.  - Continue torsemide 80 qam/60 qpm.   2. HTN: Stable.   3. Pulmonary hypertension: Likely pulmonary venous hypertension that improved with diuresis. Improved with continuous oxygen.  4. Murmur: Moderate TR on echo.   5. Atrial fibrillation: Chronic. Continue rate control/anticoagulation strategy. Remains on warfarin. - Followed at coumadin clinic 6. CKD: Stage III. This has been stable. BMET today.  7. OHS: On home oxygen 3 liters  8. Episodes of lightheadedness:  - Improved 9. Morbid obesity: Body mass index is 41.18 kg/(m^2).  - Encouraged to watch portions and increase activity as able.   She is stable on current meds and diastolic HF. Labs today. Follow up 2 months with Dr. Leo Rod PA-C 04/10/2016

## 2016-04-10 ENCOUNTER — Ambulatory Visit (HOSPITAL_COMMUNITY)
Admission: RE | Admit: 2016-04-10 | Discharge: 2016-04-10 | Disposition: A | Discharge status: Home / Self Care | Payer: Commercial Managed Care - HMO | Source: Ambulatory Visit | Attending: Cardiology | Admitting: Cardiology

## 2016-04-10 ENCOUNTER — Ambulatory Visit (INDEPENDENT_AMBULATORY_CARE_PROVIDER_SITE_OTHER): Payer: Commercial Managed Care - HMO | Admitting: Pharmacist

## 2016-04-10 VITALS — BP 116/72 | HR 80 | Wt 240.0 lb

## 2016-04-10 DIAGNOSIS — I482 Chronic atrial fibrillation, unspecified: Secondary | ICD-10-CM

## 2016-04-10 DIAGNOSIS — Z9981 Dependence on supplemental oxygen: Secondary | ICD-10-CM | POA: Insufficient documentation

## 2016-04-10 DIAGNOSIS — E7800 Pure hypercholesterolemia, unspecified: Secondary | ICD-10-CM

## 2016-04-10 DIAGNOSIS — Z87898 Personal history of other specified conditions: Secondary | ICD-10-CM | POA: Diagnosis not present

## 2016-04-10 DIAGNOSIS — I110 Hypertensive heart disease with heart failure: Secondary | ICD-10-CM

## 2016-04-10 DIAGNOSIS — N183 Chronic kidney disease, stage 3 (moderate): Secondary | ICD-10-CM | POA: Diagnosis not present

## 2016-04-10 DIAGNOSIS — Z86718 Personal history of other venous thrombosis and embolism: Secondary | ICD-10-CM | POA: Insufficient documentation

## 2016-04-10 DIAGNOSIS — Z7901 Long term (current) use of anticoagulants: Secondary | ICD-10-CM | POA: Diagnosis not present

## 2016-04-10 DIAGNOSIS — Z79899 Other long term (current) drug therapy: Secondary | ICD-10-CM | POA: Diagnosis not present

## 2016-04-10 DIAGNOSIS — I5032 Chronic diastolic (congestive) heart failure: Secondary | ICD-10-CM | POA: Diagnosis not present

## 2016-04-10 DIAGNOSIS — I272 Other secondary pulmonary hypertension: Secondary | ICD-10-CM | POA: Diagnosis not present

## 2016-04-10 DIAGNOSIS — Z6841 Body Mass Index (BMI) 40.0 and over, adult: Secondary | ICD-10-CM | POA: Insufficient documentation

## 2016-04-10 DIAGNOSIS — Z923 Personal history of irradiation: Secondary | ICD-10-CM | POA: Diagnosis not present

## 2016-04-10 DIAGNOSIS — I340 Nonrheumatic mitral (valve) insufficiency: Secondary | ICD-10-CM

## 2016-04-10 DIAGNOSIS — E1122 Type 2 diabetes mellitus with diabetic chronic kidney disease: Secondary | ICD-10-CM | POA: Diagnosis not present

## 2016-04-10 DIAGNOSIS — E785 Hyperlipidemia, unspecified: Secondary | ICD-10-CM | POA: Insufficient documentation

## 2016-04-10 DIAGNOSIS — I130 Hypertensive heart and chronic kidney disease with heart failure and stage 1 through stage 4 chronic kidney disease, or unspecified chronic kidney disease: Secondary | ICD-10-CM | POA: Diagnosis not present

## 2016-04-10 DIAGNOSIS — E662 Morbid (severe) obesity with alveolar hypoventilation: Secondary | ICD-10-CM | POA: Insufficient documentation

## 2016-04-10 LAB — POCT INR: INR: 3.4

## 2016-04-10 LAB — BASIC METABOLIC PANEL
ANION GAP: 12 (ref 5–15)
BUN: 27 mg/dL — ABNORMAL HIGH (ref 6–20)
CALCIUM: 9 mg/dL (ref 8.9–10.3)
CHLORIDE: 93 mmol/L — AB (ref 101–111)
CO2: 33 mmol/L — AB (ref 22–32)
CREATININE: 1.35 mg/dL — AB (ref 0.44–1.00)
GFR calc non Af Amer: 34 mL/min — ABNORMAL LOW (ref >60)
GFR, EST AFRICAN AMERICAN: 40 mL/min — AB (ref >60)
Glucose, Bld: 132 mg/dL — ABNORMAL HIGH (ref 65–99)
Potassium: 4.7 mmol/L (ref 3.5–5.1)
SODIUM: 138 mmol/L (ref 135–145)

## 2016-04-10 NOTE — Patient Instructions (Signed)
Labs today  Your physician recommends that you schedule a follow-up appointment in: 2 months with Dr.McLean  Do the following things EVERYDAY: 1) Weigh yourself in the morning before breakfast. Write it down and keep it in a log. 2) Take your medicines as prescribed 3) Eat low salt foods-Limit salt (sodium) to 2000 mg per day.  4) Stay as active as you can everyday 5) Limit all fluids for the day to less than 2 liters 6)

## 2016-04-11 ENCOUNTER — Other Ambulatory Visit: Payer: Self-pay | Admitting: Cardiology

## 2016-04-11 DIAGNOSIS — I10 Essential (primary) hypertension: Secondary | ICD-10-CM | POA: Diagnosis not present

## 2016-04-11 DIAGNOSIS — I5043 Acute on chronic combined systolic (congestive) and diastolic (congestive) heart failure: Secondary | ICD-10-CM | POA: Diagnosis not present

## 2016-04-11 DIAGNOSIS — I4891 Unspecified atrial fibrillation: Secondary | ICD-10-CM | POA: Diagnosis not present

## 2016-04-11 DIAGNOSIS — K922 Gastrointestinal hemorrhage, unspecified: Secondary | ICD-10-CM | POA: Diagnosis not present

## 2016-04-11 DIAGNOSIS — K264 Chronic or unspecified duodenal ulcer with hemorrhage: Secondary | ICD-10-CM | POA: Diagnosis not present

## 2016-04-24 ENCOUNTER — Ambulatory Visit (INDEPENDENT_AMBULATORY_CARE_PROVIDER_SITE_OTHER): Payer: Commercial Managed Care - HMO

## 2016-04-24 DIAGNOSIS — I482 Chronic atrial fibrillation, unspecified: Secondary | ICD-10-CM

## 2016-04-24 DIAGNOSIS — I5032 Chronic diastolic (congestive) heart failure: Secondary | ICD-10-CM | POA: Diagnosis not present

## 2016-04-24 LAB — POCT INR: INR: 2.9

## 2016-05-01 ENCOUNTER — Other Ambulatory Visit: Payer: Self-pay | Admitting: *Deleted

## 2016-05-01 DIAGNOSIS — D0510 Intraductal carcinoma in situ of unspecified breast: Secondary | ICD-10-CM

## 2016-05-02 ENCOUNTER — Ambulatory Visit: Payer: Commercial Managed Care - HMO | Admitting: Hematology

## 2016-05-02 ENCOUNTER — Encounter: Payer: Self-pay | Admitting: Hematology

## 2016-05-02 ENCOUNTER — Other Ambulatory Visit (HOSPITAL_BASED_OUTPATIENT_CLINIC_OR_DEPARTMENT_OTHER): Payer: Commercial Managed Care - HMO

## 2016-05-02 ENCOUNTER — Ambulatory Visit (HOSPITAL_BASED_OUTPATIENT_CLINIC_OR_DEPARTMENT_OTHER): Payer: Commercial Managed Care - HMO | Admitting: Hematology

## 2016-05-02 ENCOUNTER — Other Ambulatory Visit: Payer: Commercial Managed Care - HMO

## 2016-05-02 VITALS — BP 126/58 | HR 101 | Temp 98.2°F | Resp 18 | Ht 64.0 in | Wt 239.9 lb

## 2016-05-02 DIAGNOSIS — D0510 Intraductal carcinoma in situ of unspecified breast: Secondary | ICD-10-CM

## 2016-05-02 DIAGNOSIS — E119 Type 2 diabetes mellitus without complications: Secondary | ICD-10-CM

## 2016-05-02 DIAGNOSIS — D0511 Intraductal carcinoma in situ of right breast: Secondary | ICD-10-CM | POA: Diagnosis not present

## 2016-05-02 DIAGNOSIS — N189 Chronic kidney disease, unspecified: Secondary | ICD-10-CM | POA: Diagnosis not present

## 2016-05-02 DIAGNOSIS — M1990 Unspecified osteoarthritis, unspecified site: Secondary | ICD-10-CM

## 2016-05-02 DIAGNOSIS — I509 Heart failure, unspecified: Secondary | ICD-10-CM | POA: Diagnosis not present

## 2016-05-02 LAB — COMPREHENSIVE METABOLIC PANEL
ALBUMIN: 3.4 g/dL — AB (ref 3.5–5.0)
ALK PHOS: 94 U/L (ref 40–150)
ANION GAP: 10 meq/L (ref 3–11)
AST: 13 U/L (ref 5–34)
BILIRUBIN TOTAL: 0.54 mg/dL (ref 0.20–1.20)
BUN: 24.1 mg/dL (ref 7.0–26.0)
CALCIUM: 9.2 mg/dL (ref 8.4–10.4)
CO2: 34 mEq/L — ABNORMAL HIGH (ref 22–29)
CREATININE: 1.3 mg/dL — AB (ref 0.6–1.1)
Chloride: 95 mEq/L — ABNORMAL LOW (ref 98–109)
EGFR: 43 mL/min/{1.73_m2} — ABNORMAL LOW (ref >90)
Glucose: 184 mg/dl — ABNORMAL HIGH (ref 70–140)
Potassium: 4.5 mEq/L (ref 3.5–5.1)
Sodium: 139 mEq/L (ref 136–145)
TOTAL PROTEIN: 7.5 g/dL (ref 6.4–8.3)

## 2016-05-02 LAB — CBC WITH DIFFERENTIAL/PLATELET
BASO%: 0.4 % (ref 0.0–2.0)
Basophils Absolute: 0 10*3/uL (ref 0.0–0.1)
EOS%: 3.5 % (ref 0.0–7.0)
Eosinophils Absolute: 0.2 10*3/uL (ref 0.0–0.5)
HEMATOCRIT: 29.2 % — AB (ref 34.8–46.6)
HGB: 9.1 g/dL — ABNORMAL LOW (ref 11.6–15.9)
LYMPH#: 1.8 10*3/uL (ref 0.9–3.3)
LYMPH%: 29.7 % (ref 14.0–49.7)
MCH: 30.8 pg (ref 25.1–34.0)
MCHC: 31.2 g/dL — AB (ref 31.5–36.0)
MCV: 98.5 fL (ref 79.5–101.0)
MONO#: 0.6 10*3/uL (ref 0.1–0.9)
MONO%: 9.4 % (ref 0.0–14.0)
NEUT%: 57 % (ref 38.4–76.8)
NEUTROS ABS: 3.4 10*3/uL (ref 1.5–6.5)
PLATELETS: 145 10*3/uL (ref 145–400)
RBC: 2.97 10*6/uL — ABNORMAL LOW (ref 3.70–5.45)
RDW: 13.1 % (ref 11.2–14.5)
WBC: 6 10*3/uL (ref 3.9–10.3)

## 2016-05-02 NOTE — Progress Notes (Signed)
[name]  OFFICE PROGRESS NOTE  CC Dr. [name] Dr. [name] Dr. [name]  DIAGNOSIS:  82 year old female DCIS diagnosed 07/2010  PRIOR THERAPY: 1. 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 Tumor was ER+  2. S/p radiation therapy to the right breast completed November 2011.  3. Tamoxifen 20 mg from October 21 2010 - November 13, 2010 but discontinued due to possible DVT of the right leg.  3. Switched to [employer] 25 daily starting 12/14/11patient was discontinued due intolerance and side effects  CURRENT THERAPY: observation  INTERVAL HISTORY: [name] 82 y.o. female returns for follow up visit. She was last seen by me 1 year ago. She has been doing well overall, she has started home oxygen by her cardiologist for her chest congestive heart failure last year, she sees her primary care physician and cardiologist regularly. She also has some pain from her arthritis, walks with a cane, able to take of most of her ADLs at home. She came in with her daughter today. No other new complaints.   MEDICAL HISTORY: Past Medical History  Diagnosis Date  . 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 DVT (deep venous thrombosis) ([name])   . Arthritis   . Hyperlipidemia   . Hypertension   . CHF (congestive heart failure) ([name])   . Anemia   . Cholelithiasis   . GI bleed   . Breast cancer ([name]) 2012    right  . Peripheral neuropathy ([name])   . Heart murmur   . Syncope 05/09/2015  . Shortness of breath dyspnea   . Diabetes mellitus   . GERD (gastroesophageal reflux disease)     ALLERGIES:  is allergic to peach.  MEDICATIONS:  Current Outpatient Prescriptions  Medication Sig Dispense Refill  . acetaminophen (TYLENOL) 500 MG tablet Take 1,000 mg by mouth 2 (two) times daily as needed for mild pain.     Marland Kitchen atorvastatin (LIPITOR) 20 MG tablet TAKE 1 TABLET BY MOUTH EVERY DAY  90 tablet 3  . gabapentin (NEURONTIN) 300 MG capsule Take 300 mg by mouth at bedtime.   3  . metoprolol tartrate (LOPRESSOR) 25 MG tablet Take 1 tablet (25 mg total) by mouth 2 (two) times daily. 180 tablet 3  . pantoprazole (PROTONIX) 40 MG tablet Take 1 tablet (40 mg total) by mouth 2 (two) times daily before a meal. 60 tablet 3  . potassium chloride SA (KLOR-CON M20) 20 MEQ tablet Take 2 tabs (40 MeQ) in the AM and take 1 tab (20 MEQ) in the PM 90 tablet 6  . torsemide (DEMADEX) 20 MG tablet Take 80 mg (4 tabs) in am and 60mg  (3 tabs) in pm 360 tablet 6  . warfarin (COUMADIN) 2 MG tablet TAKE 1 TABLET BY MOUTH FOR 5 DAYS A WEEK, THEN 2 TABLETS ON THURSDAY AND SATURDAY 120 tablet 3   No current facility-administered medications for this visit.    SURGICAL HISTORY:  Past Surgical History  Procedure Laterality Date  . Breast surgery    . Esophagogastroduodenoscopy  09/01/2012    Procedure: ESOPHAGOGASTRODUODENOSCOPY (EGD);  Surgeon: Ladene Artist, MD,[name];  Location: [facility] ENDOSCOPY;  Service: Endoscopy;  Laterality: N/A;  . Esophagogastroduodenoscopy  09/03/2012    Procedure: ESOPHAGOGASTRODUODENOSCOPY (EGD);  Surgeon: Inda Castle, MD;  Location: [place];  Service: Endoscopy;  Laterality: N/A;  . Endoscopic retrograde cholangiopancreatography (ercp) with  propofol  10/14/2012    Procedure: ENDOSCOPIC RETROGRADE CHOLANGIOPANCREATOGRAPHY (ERCP) WITH PROPOFOL;  Surgeon: Milus Banister, MD;  Location: [facility] ENDOSCOPY;  Service: Endoscopy;  Laterality: N/A;  . Eus  10/14/2012    Procedure: UPPER ENDOSCOPIC ULTRASOUND (EUS) LINEAR;  Surgeon: Milus Banister, MD;  Location: [facility] ENDOSCOPY;  Service: Endoscopy;  Laterality: N/A;  . Eus  11/11/2012    Procedure: UPPER ENDOSCOPIC ULTRASOUND (EUS) LINEAR;  Surgeon: Milus Banister, MD;  Location: [facility] ENDOSCOPY;  Service: Endoscopy;  Laterality: N/A;  . Cataract extraction Left 05/08/2015  . Cardiac catheterization N/A 05/11/2015    Procedure: Left Heart  Cath and Coronary Angiography;  Surgeon: [name], MD;  Location: [place] CV LAB;  Service: Cardiovascular;  Laterality: N/A;  . Cardiac catheterization N/A 08/09/2015    Procedure: Right Heart Cath;  Surgeon: [name], MD;  Location: [place] CV LAB;  Service: Cardiovascular;  Laterality: N/A;    REVIEW OF SYSTEMS:  Pertinent items are noted in HPI.   PHYSICAL EXAMINATION:  BP 126/58 mmHg  Pulse 101  Temp(Src) 98.2 F (36.8 C) (Oral)  Resp 18  Ht 5\' 4"  (1.626 m)  Wt 239 lb 14.4 oz (108.818 kg)  BMI 41.16 kg/m2  SpO2 92%  GENERAL:alert, no distress and comfortable; well developed and well nourished, in wheelchair with nasal cannula oxygen.  SKIN: skin color, texture, turgor are normal, no rashes or significant lesions EYES: normal, Conjunctiva are pink and non-injected, sclera clear OROPHARYNX:no exudate, no erythema and lips, buccal mucosa, and tongue normal  NECK: supple, thyroid normal size, non-tender, without nodularity LYMPH:  no palpable lymphadenopathy in the cervical, axillary or supraclavicular LUNGS: clear to auscultation with normal breathing effort, no wheezes or rhonchi HEART: regular rate & rhythm and no murmurs and no lower extremity edema ABDOMEN:abdomen soft, non-tender and normal bowel sounds Musculoskeletal:no cyanosis of digits and no clubbing  NEURO: alert & oriented x 3 with fluent speech, no focal motor/sensory deficits Breasts: Breast inspection showed them to be symmetrical with no nipple discharge. Palpation of the breasts and axilla revealed no obvious mass that I could appreciate. (+) well -healed surgical scar in right breast.   ECOG PERFORMANCE STATUS: 1  Blood pressure 126/58, pulse 101, temperature 98.2 F (36.8 C), temperature source Oral, resp. rate 18, height 5\' 4"  (1.626 m), weight 239 lb 14.4 oz (108.818 kg), SpO2 92 %.  LABORATORY DATA: CBC Latest Ref Rng 05/02/2016 02/28/2016 09/05/2015  WBC 3.9 - 10.3 10e3/uL 6.0 6.8 7.4   Hemoglobin 11.6 - 15.9 g/dL 9.1(L) 9.3(L) 8.4(L)  Hematocrit 34.8 - 46.6 % 29.2(L) 31.0(L) 28.0(L)  Platelets 145 - 400 10e3/uL 145 164 129(L)    CMP Latest Ref Rng 04/10/2016 02/28/2016 12/24/2015  Glucose 65 - 99 mg/dL 132(H) 132(H) 168(H)  BUN 6 - 20 mg/dL 27(H) 30(H) 23(H)  Creatinine 0.44 - 1.00 mg/dL 1.35(H) 1.45(H) 1.38(H)  Sodium 135 - 145 mmol/L 138 136 140  Potassium 3.5 - 5.1 mmol/L 4.7 4.9 4.1  Chloride 101 - 111 mmol/L 93(L) 94(L) 94(L)  CO2 22 - 32 mmol/L 33(H) 34(H) 34(H)  Calcium 8.9 - 10.3 mg/dL 9.0 8.9 8.9      RADIOGRAPHIC STUDIES: Mammogram 01/17/2016 IMPRESSION: Stable right breast lumpectomy site. No mammographic evidence of malignancy in the bilateral breasts.  RECOMMENDATION: Diagnostic mammogram is suggested in 1 year.  ASSESSMENT AND PLAN: 82 year old female with:  1. Left breast DCIS, ER+ -She is status post lumpectomy and adjuvant breast irradiation, she could not tolerate  antiestrogen therapy. -We discussed that her DCIS was cured, she is at slightly increased risk of for second breast cancer. -She is doing very well overall. No clinical concerns of breast cancer recurrence. -Her exam and last mammogram on 01/17/2016 showed no evidence of recurrence -Given her advanced age and medical comorbidities, I think so case to stop mammogram if she doesn't feel up to it. -She has been 6 years out of her initial diagnosis, I recommend her follow-up with her primary care physician for routine breast exam. -I encouraged her to continue healthy diet and a regular exercise  2. Diabetes, CKD, CHF and arthritis -She'll follow-up with her family care physician and cardiologist will let me the picture is to call me. Couldn't take   Follow-up: I recommend her to follow up PCP for breast cancer surveillance, annual mammogram only if she wants. I will see her as needed.   I spent 15 minutes counseling the patient face to face. The total time spent in the appointment was  20 minutes.   [name]  05/02/2016, 10:32 AM

## 2016-05-12 DIAGNOSIS — I5043 Acute on chronic combined systolic (congestive) and diastolic (congestive) heart failure: Secondary | ICD-10-CM | POA: Diagnosis not present

## 2016-05-12 DIAGNOSIS — I4891 Unspecified atrial fibrillation: Secondary | ICD-10-CM | POA: Diagnosis not present

## 2016-05-12 DIAGNOSIS — I10 Essential (primary) hypertension: Secondary | ICD-10-CM | POA: Diagnosis not present

## 2016-05-12 DIAGNOSIS — K922 Gastrointestinal hemorrhage, unspecified: Secondary | ICD-10-CM | POA: Diagnosis not present

## 2016-05-12 DIAGNOSIS — K264 Chronic or unspecified duodenal ulcer with hemorrhage: Secondary | ICD-10-CM | POA: Diagnosis not present

## 2016-05-22 ENCOUNTER — Ambulatory Visit (INDEPENDENT_AMBULATORY_CARE_PROVIDER_SITE_OTHER): Payer: Commercial Managed Care - HMO

## 2016-05-22 DIAGNOSIS — I5032 Chronic diastolic (congestive) heart failure: Secondary | ICD-10-CM

## 2016-05-22 DIAGNOSIS — I482 Chronic atrial fibrillation, unspecified: Secondary | ICD-10-CM

## 2016-05-22 LAB — POCT INR: INR: 2.5

## 2016-05-30 ENCOUNTER — Telehealth (HOSPITAL_COMMUNITY): Payer: Self-pay | Admitting: Cardiology

## 2016-05-30 DIAGNOSIS — I5033 Acute on chronic diastolic (congestive) heart failure: Secondary | ICD-10-CM

## 2016-05-30 MED ORDER — TORSEMIDE 20 MG PO TABS: 80.0000 mg | ORAL_TABLET | Freq: Two times a day (BID) | ORAL | Status: DC

## 2016-05-30 NOTE — Telephone Encounter (Signed)
Daughter called with concerns regarding 3lb weight gain over the past week Current weight 238 -normally 234-235 Decreased UOP x 2 days Denies swelling, increased SOB or chest pains   Please advised further  Patient cuurently taking  torsemide 80/ 60  KCL 40/20 Cr 1.3 K 4.5 (05/02/16) routine follow up scheduled for 06/17/16

## 2016-05-30 NOTE — Telephone Encounter (Signed)
Increase torsemide to 80 mg bid and followup in office.

## 2016-05-30 NOTE — Telephone Encounter (Signed)
Daughter aware.

## 2016-06-11 DIAGNOSIS — K922 Gastrointestinal hemorrhage, unspecified: Secondary | ICD-10-CM | POA: Diagnosis not present

## 2016-06-11 DIAGNOSIS — K264 Chronic or unspecified duodenal ulcer with hemorrhage: Secondary | ICD-10-CM | POA: Diagnosis not present

## 2016-06-11 DIAGNOSIS — I10 Essential (primary) hypertension: Secondary | ICD-10-CM | POA: Diagnosis not present

## 2016-06-11 DIAGNOSIS — I4891 Unspecified atrial fibrillation: Secondary | ICD-10-CM | POA: Diagnosis not present

## 2016-06-11 DIAGNOSIS — I5043 Acute on chronic combined systolic (congestive) and diastolic (congestive) heart failure: Secondary | ICD-10-CM | POA: Diagnosis not present

## 2016-06-16 DIAGNOSIS — H2511 Age-related nuclear cataract, right eye: Secondary | ICD-10-CM | POA: Diagnosis not present

## 2016-06-16 DIAGNOSIS — H25011 Cortical age-related cataract, right eye: Secondary | ICD-10-CM | POA: Diagnosis not present

## 2016-06-16 DIAGNOSIS — H35031 Hypertensive retinopathy, right eye: Secondary | ICD-10-CM | POA: Diagnosis not present

## 2016-06-16 DIAGNOSIS — H35032 Hypertensive retinopathy, left eye: Secondary | ICD-10-CM | POA: Diagnosis not present

## 2016-06-16 DIAGNOSIS — Z961 Presence of intraocular lens: Secondary | ICD-10-CM | POA: Diagnosis not present

## 2016-06-16 DIAGNOSIS — H25041 Posterior subcapsular polar age-related cataract, right eye: Secondary | ICD-10-CM | POA: Diagnosis not present

## 2016-06-17 ENCOUNTER — Ambulatory Visit (INDEPENDENT_AMBULATORY_CARE_PROVIDER_SITE_OTHER): Payer: Commercial Managed Care - HMO | Admitting: *Deleted

## 2016-06-17 ENCOUNTER — Ambulatory Visit (HOSPITAL_COMMUNITY)
Admission: RE | Admit: 2016-06-17 | Discharge: 2016-06-17 | Disposition: A | Discharge status: Home / Self Care | Payer: Commercial Managed Care - HMO | Source: Ambulatory Visit | Attending: Cardiology | Admitting: Cardiology

## 2016-06-17 ENCOUNTER — Encounter (HOSPITAL_COMMUNITY): Payer: Self-pay

## 2016-06-17 VITALS — BP 111/61 | HR 70 | Wt 242.0 lb

## 2016-06-17 DIAGNOSIS — R011 Cardiac murmur, unspecified: Secondary | ICD-10-CM | POA: Insufficient documentation

## 2016-06-17 DIAGNOSIS — I5032 Chronic diastolic (congestive) heart failure: Secondary | ICD-10-CM

## 2016-06-17 DIAGNOSIS — Z7901 Long term (current) use of anticoagulants: Secondary | ICD-10-CM | POA: Insufficient documentation

## 2016-06-17 DIAGNOSIS — Z9981 Dependence on supplemental oxygen: Secondary | ICD-10-CM | POA: Insufficient documentation

## 2016-06-17 DIAGNOSIS — I5033 Acute on chronic diastolic (congestive) heart failure: Secondary | ICD-10-CM | POA: Diagnosis not present

## 2016-06-17 DIAGNOSIS — Z853 Personal history of malignant neoplasm of breast: Secondary | ICD-10-CM | POA: Diagnosis not present

## 2016-06-17 DIAGNOSIS — I482 Chronic atrial fibrillation, unspecified: Secondary | ICD-10-CM

## 2016-06-17 DIAGNOSIS — E1122 Type 2 diabetes mellitus with diabetic chronic kidney disease: Secondary | ICD-10-CM | POA: Diagnosis not present

## 2016-06-17 DIAGNOSIS — M1990 Unspecified osteoarthritis, unspecified site: Secondary | ICD-10-CM | POA: Diagnosis not present

## 2016-06-17 DIAGNOSIS — I340 Nonrheumatic mitral (valve) insufficiency: Secondary | ICD-10-CM | POA: Diagnosis not present

## 2016-06-17 DIAGNOSIS — Z86718 Personal history of other venous thrombosis and embolism: Secondary | ICD-10-CM | POA: Diagnosis not present

## 2016-06-17 DIAGNOSIS — I130 Hypertensive heart and chronic kidney disease with heart failure and stage 1 through stage 4 chronic kidney disease, or unspecified chronic kidney disease: Secondary | ICD-10-CM | POA: Insufficient documentation

## 2016-06-17 DIAGNOSIS — E785 Hyperlipidemia, unspecified: Secondary | ICD-10-CM | POA: Diagnosis not present

## 2016-06-17 DIAGNOSIS — I272 Other secondary pulmonary hypertension: Secondary | ICD-10-CM

## 2016-06-17 DIAGNOSIS — N183 Chronic kidney disease, stage 3 (moderate): Secondary | ICD-10-CM | POA: Insufficient documentation

## 2016-06-17 DIAGNOSIS — Z6841 Body Mass Index (BMI) 40.0 and over, adult: Secondary | ICD-10-CM | POA: Insufficient documentation

## 2016-06-17 DIAGNOSIS — I2729 Other secondary pulmonary hypertension: Secondary | ICD-10-CM

## 2016-06-17 LAB — BASIC METABOLIC PANEL
Anion gap: 8 (ref 5–15)
BUN: 24 mg/dL — AB (ref 6–20)
CHLORIDE: 92 mmol/L — AB (ref 101–111)
CO2: 37 mmol/L — AB (ref 22–32)
CREATININE: 1.29 mg/dL — AB (ref 0.44–1.00)
Calcium: 8.9 mg/dL (ref 8.9–10.3)
GFR calc non Af Amer: 36 mL/min — ABNORMAL LOW (ref >60)
GFR, EST AFRICAN AMERICAN: 42 mL/min — AB (ref >60)
Glucose, Bld: 166 mg/dL — ABNORMAL HIGH (ref 65–99)
POTASSIUM: 4.5 mmol/L (ref 3.5–5.1)
Sodium: 137 mmol/L (ref 135–145)

## 2016-06-17 LAB — BRAIN NATRIURETIC PEPTIDE: B NATRIURETIC PEPTIDE 5: 312.9 pg/mL — AB (ref 0.0–100.0)

## 2016-06-17 LAB — POCT INR: INR: 2.1

## 2016-06-17 MED ORDER — METOLAZONE 2.5 MG PO TABS
2.5000 mg | ORAL_TABLET | ORAL | Status: DC
Start: 2016-06-17 — End: 2016-07-15

## 2016-06-17 MED ORDER — POTASSIUM CHLORIDE CRYS ER 20 MEQ PO TBCR: EXTENDED_RELEASE_TABLET | ORAL | Status: DC

## 2016-06-17 NOTE — Progress Notes (Signed)
Patient ID: [name], female   DOB: 06-26-1926, 82 y.o.   MRN: [id number]    Advanced Heart Failure Clinic Note   PCP: Dr. Doy Hutching Referring: Dr. Melvyn Novas Cardiology: Dr. Aundra Dubin  82 yo with chronic atrial fibrillation on coumadin, chronic diastolic CHF, and OHS presents for cardiology evaluation.  She has had several recent admissions.  In 6/16, she had syncope while sitting on the toilet.  She came to the ER.  Troponin was 0.21 and temperature was up to 101.  She was admitted and ended up having cardiac cath showing no obstructive CAD but 3+ MR.  Echo showed normal EF with grade III diastolic dysfunction, severe pulmonary hypertension, and per report only trivial MR.  She was re-admitted later in 6/16 after a fall and syncope again.  She was found to be profoundly hypoglycemic.  She is no longer on diabetes medications.    She was referred to Dr Melvyn Novas and has been on home oxygen for OHS.  She has been wheelchair-bound for the most part since 6/16.  She used to live alone prior to 6/16 but now lives with her daughter.  Admitted to [facility] 9/16 with marked volume overload. Diuresed with IV lasix and transitioned to torsemide 40 mg mg in am and 20 mg in pm. Had RHC with elevated filling pressures in a restrictive pattern and pulmonary venous htn. Discharge weight was 232 pounds.   She returns for HF follow up. Weight is up 2 lbs.  She is taking torsemide 80 mg bid (for about 1.5 weeks) but feels like she is gaining volume.  She can walk from her bed to the bathroom and back but gets short of breath if she walks much farther.  No lightheadedness/syncope. No chest pain.  Sleeps on several pillows.     Labs (7/16): K 3.7, creatinine 0.93, BNP 370 Labs (9/16): hgb 8.4 Labs (10/16): K 4.3, creatinine 1.41 Labs (11/16): K 3.7, creatinine 1.49, BNP 413 Labs (1/17): K 4.1, creatinine 1.38, BNP 389 Labs (3/17): K 4.9, creatinine 1.45 Labs (5/17): K 4.5, creatinine 1.3, hgb 9.1, plts 145  PMH: 1. HTN 2.  Type II diabetes: Currently on no meds.  3. Syncope 4. Chronic atrial fibrillation: No history of CVA. 11/16 event monitor showed chronic atrial fibrillation, no other abnormalities. 5. Chronic diastolic CHF: Echo (123XX123) with EF 55-60%, grade III diastolic dysfunction, PA systolic pressure 76 mmHg, trivial MR. RHC 08/09/2015 RA mean 29, RV 73/24, PA 74/33, mean 45,PCWP mean 33,Oxygen saturations:PA 49%AO 98% ,CO 5.12 /CI2.27, PVR 2.3 WU. 6. Cardiolite (2/16) with EF 48%, no ischemia.  LHC (6/16) with 20% distal LAD stenosis, EF 55-60%, 3+ MR.  7. Mitral regurgitation: Unclear how significant this is.  Trivial per 6/16 echo but 3+ per 6/16 cath.  8. H/o DVT 9. DCIS s/p lumpectomy and radiation in 2011.   10. OA 11. Hyperlipidemia 12. OHS: On home oxygen.  53. Morbid obesity   SH: Lives in [place] with family, never smoked.   FH: No premature CAD. +CHF.   ROS: All systems reviewed and negative except as per HPI.   Current Outpatient Prescriptions  Medication Sig Dispense Refill  . acetaminophen (TYLENOL) 500 MG tablet Take 1,000 mg by mouth 2 (two) times daily as needed for mild pain.     Marland Kitchen atorvastatin (LIPITOR) 20 MG tablet TAKE 1 TABLET BY MOUTH EVERY DAY 90 tablet 3  . gabapentin (NEURONTIN) 300 MG capsule Take 300 mg by mouth at bedtime.   3  .  metoprolol tartrate (LOPRESSOR) 25 MG tablet Take 1 tablet (25 mg total) by mouth 2 (two) times daily. 180 tablet 3  . pantoprazole (PROTONIX) 40 MG tablet Take 1 tablet (40 mg total) by mouth 2 (two) times daily before a meal. 60 tablet 3  . potassium chloride SA (KLOR-CON M20) 20 MEQ tablet Take 2 tabs (40 MeQ) in the AM and take 1 tab (20 MEQ) in the PM.  Take an extra 2 tabs on Wednesdays 100 tablet 6  . torsemide (DEMADEX) 20 MG tablet Take 4 tablets (80 mg total) by mouth 2 (two) times daily. Take 80 mg (4 tabs) in am and 60mg  (3 tabs) in pm 360 tablet 6  . warfarin (COUMADIN) 2 MG tablet TAKE 1 TABLET BY MOUTH FOR 5 DAYS A WEEK, THEN 2  TABLETS ON THURSDAY AND SATURDAY 120 tablet 3  . metolazone (ZAROXOLYN) 2.5 MG tablet Take 1 tablet (2.5 mg total) by mouth once a week. Every Wednesday 5 tablet 3   No current facility-administered medications for this encounter.   BP 111/61 mmHg  Pulse 70  Wt 242 lb (109.77 kg)  SpO2 94%  Wt Readings from Last 3 Encounters:  06/17/16 242 lb (109.77 kg)  05/02/16 239 lb 14.4 oz (108.818 kg)  04/10/16 240 lb (108.863 kg)     General: NAD, obese Arrived in wheel chair . Daughter present .  Neck: JVP 8-9 cm, no thyromegaly or thyroid nodule.  Lungs: Clear on 3 liters.   CV: Nondisplaced PMI.  Heart irregular S1/S2, no S3/S4, 2/6 HSM at LLSB.  1+ ankle edema. No carotid bruit.  Normal pedal pulses.  Abdomen: Obese, soft, NT, ND, no HSM. No bruits or masses. +BS  Skin: Intact without lesions or rashes.  Neurologic: Alert and oriented x 3.  Psych: Normal affect. Extremities: No clubbing or cyanosis.  HEENT: Normal.   Assessment/Plan: 1. Chronic diastolic CHF: Echo A999333 withLVEF 55-60%, RV mod reduced systolic function.  NYHA class IIIb-IV symptoms, not very active at all.  She appears volume overloaded on exam despite recent increase in torsemide.   - Continue torsemide 80 mg bid.   - Add metolazone 2.5 mg once weekly on Wednesdays (start tomorrow).  She will take and extra 40 mEq KCl when she takes metolazone. - BMET/BNP today and repeat in 10 days.   2. HTN: Stable.   3. Pulmonary hypertension: Likely pulmonary venous hypertension that improved with diuresis. Improved with continuous oxygen.  4. Murmur: Moderate TR on echo.   5. Atrial fibrillation: Chronic. Continue rate control/anticoagulation strategy. Remains on warfarin and metoprolol with adequate rate control.  6. CKD: Stage III. This has been stable. BMET today.  7. OHS: On home oxygen 3 liters  8. Morbid obesity: Body mass index is 41.52 kg/(m^2).  - Encouraged to watch portions and increase activity as able.    Followup in 1 month.    Loralie Champagne  06/17/2016

## 2016-06-17 NOTE — Patient Instructions (Signed)
Start Metolazone 2.5 mg every Wednesday, take 30 minutes prior to your AM Torsemide dose  Take an extra 40 meq (2 tabs) of Potassium on Wednesday with the Metolazone  Labs today  Labs in 10 days  Your physician recommends that you schedule a follow-up appointment in: 1 month

## 2016-06-27 ENCOUNTER — Ambulatory Visit (HOSPITAL_COMMUNITY)
Admission: RE | Admit: 2016-06-27 | Discharge: 2016-06-27 | Disposition: A | Discharge status: Home / Self Care | Payer: Commercial Managed Care - HMO | Source: Ambulatory Visit | Attending: Cardiology | Admitting: Cardiology

## 2016-06-27 DIAGNOSIS — I5032 Chronic diastolic (congestive) heart failure: Secondary | ICD-10-CM | POA: Diagnosis not present

## 2016-06-27 DIAGNOSIS — I5023 Acute on chronic systolic (congestive) heart failure: Secondary | ICD-10-CM | POA: Diagnosis not present

## 2016-06-27 LAB — BASIC METABOLIC PANEL
Anion gap: 11 (ref 5–15)
BUN: 26 mg/dL — AB (ref 6–20)
CHLORIDE: 86 mmol/L — AB (ref 101–111)
CO2: 37 mmol/L — AB (ref 22–32)
CREATININE: 1.65 mg/dL — AB (ref 0.44–1.00)
Calcium: 8.9 mg/dL (ref 8.9–10.3)
GFR calc non Af Amer: 27 mL/min — ABNORMAL LOW (ref >60)
GFR, EST AFRICAN AMERICAN: 31 mL/min — AB (ref >60)
Glucose, Bld: 241 mg/dL — ABNORMAL HIGH (ref 65–99)
Potassium: 3.7 mmol/L (ref 3.5–5.1)
Sodium: 134 mmol/L — ABNORMAL LOW (ref 135–145)

## 2016-06-30 ENCOUNTER — Telehealth (HOSPITAL_COMMUNITY): Payer: Self-pay | Admitting: *Deleted

## 2016-06-30 DIAGNOSIS — I5032 Chronic diastolic (congestive) heart failure: Secondary | ICD-10-CM

## 2016-06-30 NOTE — Telephone Encounter (Signed)
-----   Message from [name], MD sent at 06/27/2016  2:55 PM EDT ----- Repeat BMET in 1 week to make sure creatinine does not continue to rise.

## 2016-06-30 NOTE — Telephone Encounter (Signed)
Notes Recorded by Scarlette Calico, RN on 06/30/2016 at 2:04 PM EDT Lab appt sch for 7/28 ------  Notes Recorded by Scarlette Calico, RN on 06/30/2016 at 12:19 PM EDT Patient aware. Pt's daughter aware, she will call back to reschedule labs

## 2016-07-04 ENCOUNTER — Ambulatory Visit (HOSPITAL_COMMUNITY)
Admission: RE | Admit: 2016-07-04 | Discharge: 2016-07-04 | Disposition: A | Discharge status: Home / Self Care | Payer: Commercial Managed Care - HMO | Source: Ambulatory Visit | Attending: Cardiology | Admitting: Cardiology

## 2016-07-04 DIAGNOSIS — M1712 Unilateral primary osteoarthritis, left knee: Secondary | ICD-10-CM | POA: Diagnosis not present

## 2016-07-04 DIAGNOSIS — I5032 Chronic diastolic (congestive) heart failure: Secondary | ICD-10-CM

## 2016-07-07 ENCOUNTER — Other Ambulatory Visit (HOSPITAL_COMMUNITY): Payer: Self-pay | Admitting: Cardiology

## 2016-07-07 DIAGNOSIS — I5033 Acute on chronic diastolic (congestive) heart failure: Secondary | ICD-10-CM

## 2016-07-08 DIAGNOSIS — H25811 Combined forms of age-related cataract, right eye: Secondary | ICD-10-CM | POA: Diagnosis not present

## 2016-07-08 DIAGNOSIS — H25041 Posterior subcapsular polar age-related cataract, right eye: Secondary | ICD-10-CM | POA: Diagnosis not present

## 2016-07-08 DIAGNOSIS — H2511 Age-related nuclear cataract, right eye: Secondary | ICD-10-CM | POA: Diagnosis not present

## 2016-07-08 DIAGNOSIS — H25011 Cortical age-related cataract, right eye: Secondary | ICD-10-CM | POA: Diagnosis not present

## 2016-07-09 ENCOUNTER — Telehealth (HOSPITAL_COMMUNITY): Payer: Self-pay | Admitting: *Deleted

## 2016-07-09 DIAGNOSIS — I5032 Chronic diastolic (congestive) heart failure: Secondary | ICD-10-CM

## 2016-07-09 NOTE — Telephone Encounter (Signed)
Pt was in our office on Fri 7/28 for bmet, lab was drawn and sent to lab, however there was error with epic system and order was auto cancelled so labs never resulted.  Spoke w/pt's daughter, she is aware there was a lab error and is agreeable to bring pt back in for repeat labs, however pt has other appts the rest of this week and is unable to come.  Pt is sch for CVRR on 8/8 she will have labs done at that time at [facility], order placed.

## 2016-07-10 DIAGNOSIS — M170 Bilateral primary osteoarthritis of knee: Secondary | ICD-10-CM | POA: Diagnosis not present

## 2016-07-12 DIAGNOSIS — I4891 Unspecified atrial fibrillation: Secondary | ICD-10-CM | POA: Diagnosis not present

## 2016-07-12 DIAGNOSIS — K264 Chronic or unspecified duodenal ulcer with hemorrhage: Secondary | ICD-10-CM | POA: Diagnosis not present

## 2016-07-12 DIAGNOSIS — I10 Essential (primary) hypertension: Secondary | ICD-10-CM | POA: Diagnosis not present

## 2016-07-12 DIAGNOSIS — I5043 Acute on chronic combined systolic (congestive) and diastolic (congestive) heart failure: Secondary | ICD-10-CM | POA: Diagnosis not present

## 2016-07-12 DIAGNOSIS — K922 Gastrointestinal hemorrhage, unspecified: Secondary | ICD-10-CM | POA: Diagnosis not present

## 2016-07-14 ENCOUNTER — Other Ambulatory Visit (HOSPITAL_COMMUNITY): Payer: Self-pay | Admitting: Cardiology

## 2016-07-14 DIAGNOSIS — I5033 Acute on chronic diastolic (congestive) heart failure: Secondary | ICD-10-CM

## 2016-07-15 ENCOUNTER — Ambulatory Visit (INDEPENDENT_AMBULATORY_CARE_PROVIDER_SITE_OTHER): Payer: Commercial Managed Care - HMO | Admitting: Pharmacist

## 2016-07-15 ENCOUNTER — Other Ambulatory Visit: Payer: Commercial Managed Care - HMO | Admitting: *Deleted

## 2016-07-15 ENCOUNTER — Other Ambulatory Visit (HOSPITAL_COMMUNITY): Payer: Self-pay | Admitting: *Deleted

## 2016-07-15 ENCOUNTER — Other Ambulatory Visit: Payer: Commercial Managed Care - HMO

## 2016-07-15 DIAGNOSIS — I482 Chronic atrial fibrillation, unspecified: Secondary | ICD-10-CM

## 2016-07-15 DIAGNOSIS — I110 Hypertensive heart disease with heart failure: Secondary | ICD-10-CM

## 2016-07-15 DIAGNOSIS — I5032 Chronic diastolic (congestive) heart failure: Secondary | ICD-10-CM | POA: Diagnosis not present

## 2016-07-15 LAB — BASIC METABOLIC PANEL
BUN: 27 mg/dL — AB (ref 7–25)
CALCIUM: 8.7 mg/dL (ref 8.6–10.4)
CHLORIDE: 92 mmol/L — AB (ref 98–110)
CO2: 42 mmol/L — ABNORMAL HIGH (ref 20–31)
CREATININE: 1.44 mg/dL — AB (ref 0.60–0.88)
Glucose, Bld: 221 mg/dL — ABNORMAL HIGH (ref 65–99)
Potassium: 5 mmol/L (ref 3.5–5.3)
Sodium: 139 mmol/L (ref 135–146)

## 2016-07-15 LAB — POCT INR: INR: 2.1

## 2016-07-15 MED ORDER — METOLAZONE 2.5 MG PO TABS: 2.5000 mg | ORAL_TABLET | ORAL | 3 refills | Status: DC

## 2016-07-15 NOTE — Addendum Note (Signed)
Addended by: Eulis Foster on: 07/15/2016 10:14 AM   Modules accepted: Orders

## 2016-07-15 NOTE — Addendum Note (Signed)
Addended by: Eulis Foster on: 07/15/2016 10:15 AM   Modules accepted: Orders

## 2016-07-16 ENCOUNTER — Telehealth (HOSPITAL_COMMUNITY): Payer: Self-pay | Admitting: Cardiology

## 2016-07-16 ENCOUNTER — Telehealth: Payer: Self-pay | Admitting: Cardiology

## 2016-07-16 DIAGNOSIS — I5033 Acute on chronic diastolic (congestive) heart failure: Secondary | ICD-10-CM

## 2016-07-16 DIAGNOSIS — M170 Bilateral primary osteoarthritis of knee: Secondary | ICD-10-CM | POA: Diagnosis not present

## 2016-07-16 MED ORDER — POTASSIUM CHLORIDE CRYS ER 20 MEQ PO TBCR: 20.0000 meq | EXTENDED_RELEASE_TABLET | Freq: Two times a day (BID) | ORAL | 6 refills | Status: DC

## 2016-07-16 NOTE — Telephone Encounter (Signed)
New message     Pt returning nurse call. please call.

## 2016-07-16 NOTE — Telephone Encounter (Signed)
-----   Message from [name], MD sent at 07/16/2016  8:55 AM EDT ----- K 5, would decrease KCl to 20 bid and repeat BMET in 7 days.

## 2016-07-17 DIAGNOSIS — Z0100 Encounter for examination of eyes and vision without abnormal findings: Secondary | ICD-10-CM | POA: Diagnosis not present

## 2016-07-18 ENCOUNTER — Other Ambulatory Visit: Payer: Commercial Managed Care - HMO

## 2016-07-23 ENCOUNTER — Ambulatory Visit (HOSPITAL_COMMUNITY)
Admission: RE | Admit: 2016-07-23 | Discharge: 2016-07-23 | Disposition: A | Discharge status: Home / Self Care | Payer: Commercial Managed Care - HMO | Source: Ambulatory Visit | Attending: Cardiology | Admitting: Cardiology

## 2016-07-23 ENCOUNTER — Encounter (HOSPITAL_COMMUNITY): Payer: Self-pay

## 2016-07-23 VITALS — BP 118/62 | HR 82 | Wt 241.0 lb

## 2016-07-23 DIAGNOSIS — Z87898 Personal history of other specified conditions: Secondary | ICD-10-CM | POA: Insufficient documentation

## 2016-07-23 DIAGNOSIS — Z79899 Other long term (current) drug therapy: Secondary | ICD-10-CM | POA: Insufficient documentation

## 2016-07-23 DIAGNOSIS — I5032 Chronic diastolic (congestive) heart failure: Secondary | ICD-10-CM | POA: Insufficient documentation

## 2016-07-23 DIAGNOSIS — Z86718 Personal history of other venous thrombosis and embolism: Secondary | ICD-10-CM | POA: Insufficient documentation

## 2016-07-23 DIAGNOSIS — Z6841 Body Mass Index (BMI) 40.0 and over, adult: Secondary | ICD-10-CM | POA: Diagnosis not present

## 2016-07-23 DIAGNOSIS — Z7901 Long term (current) use of anticoagulants: Secondary | ICD-10-CM | POA: Insufficient documentation

## 2016-07-23 DIAGNOSIS — E785 Hyperlipidemia, unspecified: Secondary | ICD-10-CM | POA: Insufficient documentation

## 2016-07-23 DIAGNOSIS — I482 Chronic atrial fibrillation, unspecified: Secondary | ICD-10-CM

## 2016-07-23 DIAGNOSIS — E1122 Type 2 diabetes mellitus with diabetic chronic kidney disease: Secondary | ICD-10-CM | POA: Diagnosis not present

## 2016-07-23 DIAGNOSIS — I272 Other secondary pulmonary hypertension: Secondary | ICD-10-CM | POA: Insufficient documentation

## 2016-07-23 DIAGNOSIS — I130 Hypertensive heart and chronic kidney disease with heart failure and stage 1 through stage 4 chronic kidney disease, or unspecified chronic kidney disease: Secondary | ICD-10-CM | POA: Diagnosis not present

## 2016-07-23 DIAGNOSIS — E662 Morbid (severe) obesity with alveolar hypoventilation: Secondary | ICD-10-CM | POA: Diagnosis not present

## 2016-07-23 DIAGNOSIS — M1990 Unspecified osteoarthritis, unspecified site: Secondary | ICD-10-CM | POA: Insufficient documentation

## 2016-07-23 DIAGNOSIS — N183 Chronic kidney disease, stage 3 (moderate): Secondary | ICD-10-CM | POA: Insufficient documentation

## 2016-07-23 DIAGNOSIS — Z923 Personal history of irradiation: Secondary | ICD-10-CM | POA: Insufficient documentation

## 2016-07-23 DIAGNOSIS — I2729 Other secondary pulmonary hypertension: Secondary | ICD-10-CM

## 2016-07-23 DIAGNOSIS — Z9981 Dependence on supplemental oxygen: Secondary | ICD-10-CM | POA: Insufficient documentation

## 2016-07-23 LAB — BASIC METABOLIC PANEL
Anion gap: 8 (ref 5–15)
BUN: 25 mg/dL — AB (ref 6–20)
CALCIUM: 8.9 mg/dL (ref 8.9–10.3)
CHLORIDE: 91 mmol/L — AB (ref 101–111)
CO2: 38 mmol/L — ABNORMAL HIGH (ref 22–32)
CREATININE: 1.32 mg/dL — AB (ref 0.44–1.00)
GFR calc non Af Amer: 35 mL/min — ABNORMAL LOW (ref >60)
GFR, EST AFRICAN AMERICAN: 41 mL/min — AB (ref >60)
Glucose, Bld: 163 mg/dL — ABNORMAL HIGH (ref 65–99)
Potassium: 4 mmol/L (ref 3.5–5.1)
SODIUM: 137 mmol/L (ref 135–145)

## 2016-07-23 LAB — BRAIN NATRIURETIC PEPTIDE: B NATRIURETIC PEPTIDE 5: 289.2 pg/mL — AB (ref 0.0–100.0)

## 2016-07-23 MED ORDER — METOLAZONE 2.5 MG PO TABS: 2.5000 mg | ORAL_TABLET | ORAL | 3 refills | Status: DC

## 2016-07-23 NOTE — Patient Instructions (Signed)
Increase Metolazone to twice a week, EVERY WED AND SAT  PLEASE CALL us BACK AND LET us KNOW WHAT YOUR POTASSIUM DOSE IS  Labs today  Labs in 7-10 days  We will contact you in 2 months to schedule your next appointment.

## 2016-07-24 ENCOUNTER — Telehealth (HOSPITAL_COMMUNITY): Payer: Self-pay

## 2016-07-24 NOTE — Progress Notes (Signed)
Patient ID: [name], female   DOB: 23-Jul-1926, 82 y.o.   MRN: [id number]    Advanced Heart Failure Clinic Note   PCP: Dr. Doy Hutching Referring: Dr. Melvyn Novas Cardiology: Dr. Aundra Dubin  82 yo with chronic atrial fibrillation on coumadin, chronic diastolic CHF, and OHS presents for cardiology evaluation.  She has had several recent admissions.  In 6/16, she had syncope while sitting on the toilet.  She came to the ER.  Troponin was 0.21 and temperature was up to 101.  She was admitted and ended up having cardiac cath showing no obstructive CAD but 3+ MR.  Echo showed normal EF with grade III diastolic dysfunction, severe pulmonary hypertension, and per report only trivial MR.  She was re-admitted later in 6/16 after a fall and syncope again.  She was found to be profoundly hypoglycemic.  She is no longer on diabetes medications.    She was referred to Dr Melvyn Novas and has been on home oxygen for OHS.  She has been wheelchair-bound for the most part since 6/16.  She used to live alone prior to 6/16 but now lives with her daughter.  Admitted to [facility] 9/16 with marked volume overload. Diuresed with IV lasix and transitioned to torsemide 40 mg mg in am and 20 mg in pm. Had RHC with elevated filling pressures in a restrictive pattern and pulmonary venous htn. Discharge weight was 232 pounds.   She returns for HF follow up. At last appointment, I added metolazone once a week.  Her weight is down 1 lb.  Still short of breath walking across her house.  No lightheadedness/syncope. No chest pain.  Sleeps on several pillows.   Family not sure how much potassium she is getting.   Labs (7/16): K 3.7, creatinine 0.93, BNP 370 Labs (9/16): hgb 8.4 Labs (10/16): K 4.3, creatinine 1.41 Labs (11/16): K 3.7, creatinine 1.49, BNP 413 Labs (1/17): K 4.1, creatinine 1.38, BNP 389 Labs (3/17): K 4.9, creatinine 1.45 Labs (5/17): K 4.5, creatinine 1.3, hgb 9.1, plts 145 Labs (7/17): K 5, creatinine 1.44  PMH: 1. HTN 2. Type II  diabetes: Currently on no meds.  3. Syncope 4. Chronic atrial fibrillation: No history of CVA. 11/16 event monitor showed chronic atrial fibrillation, no other abnormalities. 5. Chronic diastolic CHF: Echo (123XX123) with EF 55-60%, grade III diastolic dysfunction, PA systolic pressure 76 mmHg, trivial MR. RHC 08/09/2015 RA mean 29, RV 73/24, PA 74/33, mean 45,PCWP mean 33,Oxygen saturations:PA 49%AO 98% ,CO 5.12 /CI2.27, PVR 2.3 WU. 6. Cardiolite (2/16) with EF 48%, no ischemia.  LHC (6/16) with 20% distal LAD stenosis, EF 55-60%, 3+ MR.  7. Mitral regurgitation: Unclear how significant this is.  Trivial per 6/16 echo but 3+ per 6/16 cath.  8. H/o DVT 9. DCIS s/p lumpectomy and radiation in 2011.   10. OA 11. Hyperlipidemia 12. OHS: On home oxygen.  82. Morbid obesity   SH: Lives in [place] with family, never smoked.   FH: No premature CAD. +CHF.   ROS: All systems reviewed and negative except as per HPI.   Current Outpatient Prescriptions  Medication Sig Dispense Refill  . acetaminophen (TYLENOL) 500 MG tablet Take 1,000 mg by mouth 2 (two) times daily as needed for mild pain.     Marland Kitchen atorvastatin (LIPITOR) 20 MG tablet TAKE 1 TABLET BY MOUTH EVERY DAY 90 tablet 3  . gabapentin (NEURONTIN) 300 MG capsule Take 300 mg by mouth at bedtime.   3  . metolazone (ZAROXOLYN) 2.5 MG tablet  Take 1 tablet (2.5 mg total) by mouth 2 (two) times a week. Every Wednesday and Saturday 15 tablet 3  . metoprolol tartrate (LOPRESSOR) 25 MG tablet Take 1 tablet (25 mg total) by mouth 2 (two) times daily. 180 tablet 3  . pantoprazole (PROTONIX) 40 MG tablet Take 1 tablet (40 mg total) by mouth 2 (two) times daily before a meal. 60 tablet 3  . potassium chloride SA (KLOR-CON M20) 20 MEQ tablet Take 1 tablet (20 mEq total) by mouth 2 (two) times daily. Take an extra 2 tabs on Wednesdays 100 tablet 6  . torsemide (DEMADEX) 20 MG tablet Take 80 mg by mouth 2 (two) times daily.    Marland Kitchen warfarin (COUMADIN) 2 MG tablet  TAKE 1 TABLET BY MOUTH FOR 5 DAYS A WEEK, THEN 2 TABLETS ON THURSDAY AND SATURDAY 120 tablet 3   No current facility-administered medications for this encounter.    BP 118/62   Pulse 82   Wt 241 lb (109.3 kg)   SpO2 100% Comment: on 3L of O2  BMI 41.37 kg/m   Wt Readings from Last 3 Encounters:  07/23/16 241 lb (109.3 kg)  06/17/16 242 lb (109.8 kg)  05/02/16 239 lb 14.4 oz (108.8 kg)     General: NAD, obese Arrived in wheel chair . Daughter present .  Neck: JVP 8-9 cm, no thyromegaly or thyroid nodule.  Lungs: Clear on 3 liters.   CV: Nondisplaced PMI.  Heart irregular S1/S2, no S3/S4, 2/6 HSM at LLSB.  Trace ankle edema. No carotid bruit.  Normal pedal pulses.  Abdomen: Obese, soft, NT, ND, no HSM. No bruits or masses. +BS  Skin: Intact without lesions or rashes.  Neurologic: Alert and oriented x 3.  Psych: Normal affect. Extremities: No clubbing or cyanosis.  HEENT: Normal.   Assessment/Plan: 1. Chronic diastolic CHF: Echo A999333 withLVEF 55-60%, RV mod reduced systolic function.  NYHA class IIIb symptoms, not very active.  She is still volume overloaded on exam but somewhat improved compared to last appointment.  - Continue torsemide 80 mg bid.   - Increase metolazone to 2.5 mg twice weekly on Wednesdays and Saturdays.   - BMET/BNP today and repeat in 10 days.   - Family is unsure how much KCl she is taking.  They will check her bottles at home and call back.  2. HTN: Stable.   3. Pulmonary hypertension: Likely pulmonary venous hypertension that improved with diuresis. Improved with continuous oxygen.  4. Murmur: Moderate TR on echo.   5. Atrial fibrillation: Chronic. Continue rate control/anticoagulation strategy. Remains on warfarin and metoprolol with adequate rate control.  6. CKD: Stage III. This has been stable. BMET today.  7. OHS: On home oxygen 3 liters  8. Morbid obesity: Body mass index is 41.37 kg/m.  - Encouraged to watch portions and increase activity as  able.   Followup in 2 months.    Loralie Champagne  07/24/2016

## 2016-07-24 NOTE — Telephone Encounter (Signed)
Patient calling CHF clinic to confirm with Dr. Aundra Dubin after yesterday's OV her daily potassium regimen. Patient takes 3 tabs in am and 2 tabs in pm on days she takes metolazone. All other days patient takes 3 tabs in am and none in pm. Will forward to Dr. Aundra Dubin to make him aware. Advised to contact CHF clinic for any further questions/concerns/needs.  Renee Pain, RN

## 2016-08-04 ENCOUNTER — Ambulatory Visit (HOSPITAL_COMMUNITY)
Admission: RE | Admit: 2016-08-04 | Discharge: 2016-08-04 | Disposition: A | Discharge status: Home / Self Care | Payer: Commercial Managed Care - HMO | Source: Ambulatory Visit | Attending: Cardiology | Admitting: Cardiology

## 2016-08-04 DIAGNOSIS — I5032 Chronic diastolic (congestive) heart failure: Secondary | ICD-10-CM | POA: Insufficient documentation

## 2016-08-04 LAB — BASIC METABOLIC PANEL
ANION GAP: 13 (ref 5–15)
BUN: 44 mg/dL — ABNORMAL HIGH (ref 6–20)
CHLORIDE: 88 mmol/L — AB (ref 101–111)
CO2: 32 mmol/L (ref 22–32)
Calcium: 8.9 mg/dL (ref 8.9–10.3)
Creatinine, Ser: 1.61 mg/dL — ABNORMAL HIGH (ref 0.44–1.00)
GFR, EST AFRICAN AMERICAN: 32 mL/min — AB (ref >60)
GFR, EST NON AFRICAN AMERICAN: 28 mL/min — AB (ref >60)
Glucose, Bld: 262 mg/dL — ABNORMAL HIGH (ref 65–99)
POTASSIUM: 4.4 mmol/L (ref 3.5–5.1)
SODIUM: 133 mmol/L — AB (ref 135–145)

## 2016-08-04 MED ORDER — METOLAZONE 2.5 MG PO TABS: 2.5000 mg | ORAL_TABLET | ORAL | 3 refills | Status: DC

## 2016-08-04 NOTE — Addendum Note (Signed)
Encounter addended by: Harvie Junior, CMA on: 08/04/2016 11:42 AM<BR>    Actions taken: Order Entry activity accessed

## 2016-08-12 DIAGNOSIS — I5043 Acute on chronic combined systolic (congestive) and diastolic (congestive) heart failure: Secondary | ICD-10-CM | POA: Diagnosis not present

## 2016-08-12 DIAGNOSIS — K264 Chronic or unspecified duodenal ulcer with hemorrhage: Secondary | ICD-10-CM | POA: Diagnosis not present

## 2016-08-12 DIAGNOSIS — K922 Gastrointestinal hemorrhage, unspecified: Secondary | ICD-10-CM | POA: Diagnosis not present

## 2016-08-12 DIAGNOSIS — I10 Essential (primary) hypertension: Secondary | ICD-10-CM | POA: Diagnosis not present

## 2016-08-12 DIAGNOSIS — I4891 Unspecified atrial fibrillation: Secondary | ICD-10-CM | POA: Diagnosis not present

## 2016-08-24 ENCOUNTER — Encounter (HOSPITAL_COMMUNITY): Payer: Self-pay

## 2016-08-24 ENCOUNTER — Inpatient Hospital Stay (HOSPITAL_COMMUNITY)
Admission: EM | Admit: 2016-08-24 | Discharge: 2016-08-28 | DRG: 641 | Disposition: A | Discharge status: Skilled Nursing Facility | Payer: Commercial Managed Care - HMO | Attending: Internal Medicine | Admitting: Internal Medicine

## 2016-08-24 ENCOUNTER — Emergency Department (HOSPITAL_COMMUNITY): Payer: Commercial Managed Care - HMO

## 2016-08-24 DIAGNOSIS — I10 Essential (primary) hypertension: Secondary | ICD-10-CM | POA: Diagnosis not present

## 2016-08-24 DIAGNOSIS — S22000A Wedge compression fracture of unspecified thoracic vertebra, initial encounter for closed fracture: Secondary | ICD-10-CM | POA: Diagnosis present

## 2016-08-24 DIAGNOSIS — Z7901 Long term (current) use of anticoagulants: Secondary | ICD-10-CM

## 2016-08-24 DIAGNOSIS — I482 Chronic atrial fibrillation, unspecified: Secondary | ICD-10-CM | POA: Diagnosis present

## 2016-08-24 DIAGNOSIS — Z853 Personal history of malignant neoplasm of breast: Secondary | ICD-10-CM

## 2016-08-24 DIAGNOSIS — S3992XA Unspecified injury of lower back, initial encounter: Secondary | ICD-10-CM | POA: Diagnosis not present

## 2016-08-24 DIAGNOSIS — E669 Obesity, unspecified: Secondary | ICD-10-CM | POA: Diagnosis not present

## 2016-08-24 DIAGNOSIS — Z9842 Cataract extraction status, left eye: Secondary | ICD-10-CM

## 2016-08-24 DIAGNOSIS — E860 Dehydration: Principal | ICD-10-CM | POA: Diagnosis present

## 2016-08-24 DIAGNOSIS — R278 Other lack of coordination: Secondary | ICD-10-CM | POA: Diagnosis not present

## 2016-08-24 DIAGNOSIS — E7800 Pure hypercholesterolemia, unspecified: Secondary | ICD-10-CM | POA: Diagnosis present

## 2016-08-24 DIAGNOSIS — Z91018 Allergy to other foods: Secondary | ICD-10-CM

## 2016-08-24 DIAGNOSIS — I130 Hypertensive heart and chronic kidney disease with heart failure and stage 1 through stage 4 chronic kidney disease, or unspecified chronic kidney disease: Secondary | ICD-10-CM | POA: Diagnosis present

## 2016-08-24 DIAGNOSIS — M4854XA Collapsed vertebra, not elsewhere classified, thoracic region, initial encounter for fracture: Secondary | ICD-10-CM | POA: Diagnosis present

## 2016-08-24 DIAGNOSIS — E1165 Type 2 diabetes mellitus with hyperglycemia: Secondary | ICD-10-CM | POA: Diagnosis present

## 2016-08-24 DIAGNOSIS — J80 Acute respiratory distress syndrome: Secondary | ICD-10-CM | POA: Diagnosis not present

## 2016-08-24 DIAGNOSIS — I959 Hypotension, unspecified: Secondary | ICD-10-CM | POA: Diagnosis present

## 2016-08-24 DIAGNOSIS — I509 Heart failure, unspecified: Secondary | ICD-10-CM | POA: Diagnosis not present

## 2016-08-24 DIAGNOSIS — I272 Other secondary pulmonary hypertension: Secondary | ICD-10-CM

## 2016-08-24 DIAGNOSIS — R1110 Vomiting, unspecified: Secondary | ICD-10-CM

## 2016-08-24 DIAGNOSIS — S299XXA Unspecified injury of thorax, initial encounter: Secondary | ICD-10-CM | POA: Diagnosis not present

## 2016-08-24 DIAGNOSIS — E118 Type 2 diabetes mellitus with unspecified complications: Secondary | ICD-10-CM | POA: Diagnosis not present

## 2016-08-24 DIAGNOSIS — M25511 Pain in right shoulder: Secondary | ICD-10-CM | POA: Diagnosis not present

## 2016-08-24 DIAGNOSIS — N189 Chronic kidney disease, unspecified: Secondary | ICD-10-CM | POA: Diagnosis not present

## 2016-08-24 DIAGNOSIS — R55 Syncope and collapse: Secondary | ICD-10-CM | POA: Diagnosis present

## 2016-08-24 DIAGNOSIS — M542 Cervicalgia: Secondary | ICD-10-CM | POA: Diagnosis not present

## 2016-08-24 DIAGNOSIS — Z23 Encounter for immunization: Secondary | ICD-10-CM | POA: Diagnosis not present

## 2016-08-24 DIAGNOSIS — I4891 Unspecified atrial fibrillation: Secondary | ICD-10-CM

## 2016-08-24 DIAGNOSIS — E1122 Type 2 diabetes mellitus with diabetic chronic kidney disease: Secondary | ICD-10-CM | POA: Diagnosis present

## 2016-08-24 DIAGNOSIS — M6281 Muscle weakness (generalized): Secondary | ICD-10-CM | POA: Diagnosis not present

## 2016-08-24 DIAGNOSIS — I340 Nonrheumatic mitral (valve) insufficiency: Secondary | ICD-10-CM | POA: Diagnosis present

## 2016-08-24 DIAGNOSIS — M545 Low back pain: Secondary | ICD-10-CM | POA: Diagnosis not present

## 2016-08-24 DIAGNOSIS — I5032 Chronic diastolic (congestive) heart failure: Secondary | ICD-10-CM | POA: Diagnosis present

## 2016-08-24 DIAGNOSIS — W1830XA Fall on same level, unspecified, initial encounter: Secondary | ICD-10-CM | POA: Diagnosis present

## 2016-08-24 DIAGNOSIS — W19XXXA Unspecified fall, initial encounter: Secondary | ICD-10-CM

## 2016-08-24 DIAGNOSIS — S4991XA Unspecified injury of right shoulder and upper arm, initial encounter: Secondary | ICD-10-CM | POA: Diagnosis not present

## 2016-08-24 DIAGNOSIS — IMO0002 Reserved for concepts with insufficient information to code with codable children: Secondary | ICD-10-CM | POA: Diagnosis present

## 2016-08-24 DIAGNOSIS — L8990 Pressure ulcer of unspecified site, unspecified stage: Secondary | ICD-10-CM | POA: Insufficient documentation

## 2016-08-24 DIAGNOSIS — Z79899 Other long term (current) drug therapy: Secondary | ICD-10-CM

## 2016-08-24 DIAGNOSIS — N179 Acute kidney failure, unspecified: Secondary | ICD-10-CM | POA: Diagnosis present

## 2016-08-24 DIAGNOSIS — S22059S Unspecified fracture of T5-T6 vertebra, sequela: Secondary | ICD-10-CM | POA: Diagnosis not present

## 2016-08-24 DIAGNOSIS — S0990XA Unspecified injury of head, initial encounter: Secondary | ICD-10-CM | POA: Diagnosis not present

## 2016-08-24 DIAGNOSIS — R140 Abdominal distension (gaseous): Secondary | ICD-10-CM | POA: Diagnosis not present

## 2016-08-24 DIAGNOSIS — Z6841 Body Mass Index (BMI) 40.0 and over, adult: Secondary | ICD-10-CM | POA: Diagnosis not present

## 2016-08-24 DIAGNOSIS — N184 Chronic kidney disease, stage 4 (severe): Secondary | ICD-10-CM | POA: Diagnosis present

## 2016-08-24 DIAGNOSIS — S199XXA Unspecified injury of neck, initial encounter: Secondary | ICD-10-CM | POA: Diagnosis not present

## 2016-08-24 DIAGNOSIS — T148 Other injury of unspecified body region: Secondary | ICD-10-CM | POA: Diagnosis not present

## 2016-08-24 DIAGNOSIS — I5033 Acute on chronic diastolic (congestive) heart failure: Secondary | ICD-10-CM

## 2016-08-24 DIAGNOSIS — M5489 Other dorsalgia: Secondary | ICD-10-CM | POA: Diagnosis not present

## 2016-08-24 DIAGNOSIS — I129 Hypertensive chronic kidney disease with stage 1 through stage 4 chronic kidney disease, or unspecified chronic kidney disease: Secondary | ICD-10-CM | POA: Diagnosis not present

## 2016-08-24 DIAGNOSIS — I119 Hypertensive heart disease without heart failure: Secondary | ICD-10-CM | POA: Diagnosis present

## 2016-08-24 LAB — CBC
HEMATOCRIT: 33.5 % — AB (ref 36.0–46.0)
HEMOGLOBIN: 10 g/dL — AB (ref 12.0–15.0)
MCH: 29.4 pg (ref 26.0–34.0)
MCHC: 29.9 g/dL — ABNORMAL LOW (ref 30.0–36.0)
MCV: 98.5 fL (ref 78.0–100.0)
Platelets: 205 10*3/uL (ref 150–400)
RBC: 3.4 MIL/uL — ABNORMAL LOW (ref 3.87–5.11)
RDW: 13.1 % (ref 11.5–15.5)
WBC: 8.8 10*3/uL (ref 4.0–10.5)

## 2016-08-24 LAB — BASIC METABOLIC PANEL
ANION GAP: 13 (ref 5–15)
BUN: 51 mg/dL — AB (ref 6–20)
CALCIUM: 9.1 mg/dL (ref 8.9–10.3)
CO2: 41 mmol/L — ABNORMAL HIGH (ref 22–32)
Chloride: 80 mmol/L — ABNORMAL LOW (ref 101–111)
Creatinine, Ser: 2.17 mg/dL — ABNORMAL HIGH (ref 0.44–1.00)
GFR calc Af Amer: 22 mL/min — ABNORMAL LOW (ref >60)
GFR, EST NON AFRICAN AMERICAN: 19 mL/min — AB (ref >60)
Glucose, Bld: 374 mg/dL — ABNORMAL HIGH (ref 65–99)
POTASSIUM: 3.8 mmol/L (ref 3.5–5.1)
SODIUM: 134 mmol/L — AB (ref 135–145)

## 2016-08-24 LAB — BRAIN NATRIURETIC PEPTIDE: B NATRIURETIC PEPTIDE 5: 244.5 pg/mL — AB (ref 0.0–100.0)

## 2016-08-24 LAB — URINALYSIS, ROUTINE W REFLEX MICROSCOPIC
Bilirubin Urine: NEGATIVE
GLUCOSE, UA: 250 mg/dL — AB
HGB URINE DIPSTICK: NEGATIVE
Ketones, ur: NEGATIVE mg/dL
Nitrite: NEGATIVE
PH: 6 (ref 5.0–8.0)
Protein, ur: NEGATIVE mg/dL
Specific Gravity, Urine: 1.009 (ref 1.005–1.030)

## 2016-08-24 LAB — URINE MICROSCOPIC-ADD ON
Bacteria, UA: NONE SEEN
RBC / HPF: NONE SEEN RBC/hpf (ref 0–5)

## 2016-08-24 LAB — PROTIME-INR
INR: 2.01
Prothrombin Time: 23.1 seconds — ABNORMAL HIGH (ref 11.4–15.2)

## 2016-08-24 LAB — I-STAT TROPONIN, ED: TROPONIN I, POC: 0.01 ng/mL (ref 0.00–0.08)

## 2016-08-24 LAB — GLUCOSE, CAPILLARY: Glucose-Capillary: 225 mg/dL — ABNORMAL HIGH (ref 65–99)

## 2016-08-24 MED ORDER — INSULIN ASPART 100 UNIT/ML ~~LOC~~ SOLN
0.0000 [IU] | Freq: Three times a day (TID) | SUBCUTANEOUS | Status: DC
  Administered 2016-08-25 – 2016-08-26 (×3): 5 [IU] via SUBCUTANEOUS
  Administered 2016-08-26: 3 [IU] via SUBCUTANEOUS
  Administered 2016-08-26: 2 [IU] via SUBCUTANEOUS
  Administered 2016-08-27: 3 [IU] via SUBCUTANEOUS
  Administered 2016-08-27: 8 [IU] via SUBCUTANEOUS
  Administered 2016-08-28: 2 [IU] via SUBCUTANEOUS
  Administered 2016-08-28: 5 [IU] via SUBCUTANEOUS

## 2016-08-24 MED ORDER — SODIUM CHLORIDE 0.9 % IV BOLUS (SEPSIS)
250.0000 mL | Freq: Once | INTRAVENOUS | Status: AC
  Administered 2016-08-24: 250 mL via INTRAVENOUS

## 2016-08-24 MED ORDER — INSULIN ASPART 100 UNIT/ML ~~LOC~~ SOLN
0.0000 [IU] | Freq: Every day | SUBCUTANEOUS | Status: DC
  Administered 2016-08-24: 2 [IU] via SUBCUTANEOUS
  Administered 2016-08-27: 3 [IU] via SUBCUTANEOUS

## 2016-08-24 MED ORDER — SODIUM CHLORIDE 0.9% FLUSH
3.0000 mL | Freq: Two times a day (BID) | INTRAVENOUS | Status: DC
  Administered 2016-08-24 – 2016-08-27 (×4): 3 mL via INTRAVENOUS

## 2016-08-24 MED ORDER — ACETAMINOPHEN 325 MG PO TABS
650.0000 mg | ORAL_TABLET | Freq: Once | ORAL | Status: AC
  Administered 2016-08-24: 650 mg via ORAL
  Filled 2016-08-24: qty 2

## 2016-08-24 MED ORDER — METOPROLOL TARTRATE 25 MG PO TABS
25.0000 mg | ORAL_TABLET | Freq: Two times a day (BID) | ORAL | Status: DC
  Administered 2016-08-24: 25 mg via ORAL
  Filled 2016-08-24 (×2): qty 1

## 2016-08-24 MED ORDER — ATORVASTATIN CALCIUM 20 MG PO TABS
20.0000 mg | ORAL_TABLET | Freq: Every day | ORAL | Status: DC
  Administered 2016-08-25 – 2016-08-28 (×4): 20 mg via ORAL
  Filled 2016-08-24 (×4): qty 1

## 2016-08-24 MED ORDER — ACETAMINOPHEN 325 MG PO TABS: 650.0000 mg | ORAL_TABLET | ORAL | Status: DC | PRN

## 2016-08-24 MED ORDER — WARFARIN - PHARMACIST DOSING INPATIENT
Freq: Every day | Status: DC
  Administered 2016-08-25: 18:00:00

## 2016-08-24 MED ORDER — SODIUM CHLORIDE 0.9 % IV BOLUS (SEPSIS)
500.0000 mL | Freq: Once | INTRAVENOUS | Status: AC
  Administered 2016-08-24: 500 mL via INTRAVENOUS

## 2016-08-24 MED ORDER — TRAMADOL HCL 50 MG PO TABS
50.0000 mg | ORAL_TABLET | Freq: Four times a day (QID) | ORAL | Status: DC | PRN
Start: 2016-08-24 — End: 2016-08-28

## 2016-08-24 MED ORDER — PANTOPRAZOLE SODIUM 40 MG PO TBEC
40.0000 mg | DELAYED_RELEASE_TABLET | Freq: Two times a day (BID) | ORAL | Status: DC
  Administered 2016-08-25 – 2016-08-28 (×7): 40 mg via ORAL
  Filled 2016-08-24 (×7): qty 1

## 2016-08-24 MED ORDER — OXYCODONE HCL 5 MG PO TABS: 5.0000 mg | ORAL_TABLET | ORAL | Status: DC | PRN

## 2016-08-24 MED ORDER — GABAPENTIN 300 MG PO CAPS
300.0000 mg | ORAL_CAPSULE | Freq: Every day | ORAL | Status: DC
  Administered 2016-08-24 – 2016-08-27 (×4): 300 mg via ORAL
  Filled 2016-08-24 (×4): qty 1

## 2016-08-24 NOTE — ED Notes (Signed)
Pt transported to xray 

## 2016-08-24 NOTE — Progress Notes (Signed)
Acknowlege order for orthostatic VS.  Not done on admission d/t new findings of thoracic compression fractures.  Will report to night shift.

## 2016-08-24 NOTE — H&P (Signed)
History and Physical    [name] [id number] DOB: [birth year] DOA: 08/24/2016   PCP: Maximino Greenland, MD   Patient coming from/Resides with: Private residence/lives with daughter who is her only caretaker  Admission status: Observation/telemetry  Chief Complaint: Syncopal episode  HPI: [name] is a 82 y.o. female with medical history significant for atrial fibrillation, history of chronic diastolic congestive heart failure in setting of mitral valve regurgitation due to secondary pulmonary hypertension, dyslipidemia, severe obesity, history of prior syncope, chronic kidney disease stage IV. Patient reports that today after getting up from a seated position to walk across the room patient had a syncopal episode. Patient does not recall actually going down that she remembers waking up on the floor. She does remember having a coughing spell right before the syncopal episode. She was not incontinent of bowel or bladder and according to family she was immediately back to her normal mentation without any signs that would be concerning for postictal phase. She was not having any pain prior to the onset of the episode she's not started any new medications she has not missed any medications recently. She does report recent issues with coughing different from her chronic cough for 2 days with yellow productive sputum but no fevers or chills to poor oral intake.   ED Course:  Vital Signs: BP 135/98   Pulse 102   Temp 98 F (36.7 C) (Oral)   Resp 20   SpO2 100%  CT cervical spine out contrast: No acute abnormalities CT head without contrast: No skull fracture and cranial hemorrhage DG thoracic spine with swimmer's view: Osseous demineralization with scattered degenerative changes in the new mild superior endplate compression fracture of T6  DG right shoulder: No acute osseous abnormalities DG sacrum and coccyx: No definite acute osseous abnormalities PCXR: No acute  abnormalities Lab data: Sodium 134, potassium 3.8, chloride 80, CO2 41, BUN 51, creatinine 2.17, glucose 374, anion gap 13, BNP 245, poc troponin 0.01, WBC 8800 differential not obtained, hemoglobin 10, platelets 205,000, PT 23, INR 2.01, urinalysis unremarkable Medications and treatments: Normal saline bolus 500 mL 1, Tylenol 650 mg by mouth 1  Review of Systems:  In addition to the HPI above,  No Fever-chills, myalgias or other constitutional symptoms No Headache, changes with Vision or hearing, new weakness, tingling, numbness in any extremity, No problems swallowing food or Liquids, indigestion/reflux No Chest pain, Shortness of Breath, palpitations, orthopnea or DOE No Abdominal pain, N/V; no melena or hematochezia, no dark tarry stools, Bowel movements are regular, No dysuria, hematuria or flank pain No new skin rashes, lesions, masses or bruises, No new joints pains-aches No recent weight gain or loss No polyuria, polydypsia or polyphagia,   Past Medical History:  Diagnosis Date  . Anemia   . Arthritis   . Breast cancer ([place]) 2012   right  . CHF (congestive heart failure) ([place])   . Cholelithiasis   . DCIS (ductal carcinoma in situ) of breast 07/15/2010   DCIS s/p lumpectomy and radiation 2011  . Diabetes mellitus   . DVT (deep venous thrombosis) ([place])   . GERD (gastroesophageal reflux disease)   . GI bleed   . Heart murmur   . Hyperlipidemia   . Hypertension   . Peripheral neuropathy ([place])   . Shortness of breath dyspnea   . Syncope 05/09/2015    Past Surgical History:  Procedure Laterality Date  . BREAST SURGERY    . CARDIAC CATHETERIZATION N/A 05/11/2015   Procedure: Left  Heart Cath and Coronary Angiography;  Surgeon: [name], MD;  Location: [facility];  Service: Cardiovascular;  Laterality: N/A;  . CARDIAC CATHETERIZATION N/A 08/09/2015   Procedure: Right Heart Cath;  Surgeon: [name], MD;  Location: [facility];  Service:  Cardiovascular;  Laterality: N/A;  . CATARACT EXTRACTION Left 05/08/2015  . ENDOSCOPIC RETROGRADE CHOLANGIOPANCREATOGRAPHY (ERCP) WITH PROPOFOL  10/14/2012   Procedure: ENDOSCOPIC RETROGRADE CHOLANGIOPANCREATOGRAPHY (ERCP) WITH PROPOFOL;  Surgeon: Milus Banister, MD;  Location: [facility];  Service: Endoscopy;  Laterality: N/A;  . ESOPHAGOGASTRODUODENOSCOPY  09/01/2012   Procedure: ESOPHAGOGASTRODUODENOSCOPY (EGD);  Surgeon: Ladene Artist, MD,FACG;  Location: [facility];  Service: Endoscopy;  Laterality: N/A;  . ESOPHAGOGASTRODUODENOSCOPY  09/03/2012   Procedure: ESOPHAGOGASTRODUODENOSCOPY (EGD);  Surgeon: Inda Castle, MD;  Location: [facility];  Service: Endoscopy;  Laterality: N/A;  . EUS  10/14/2012   Procedure: UPPER ENDOSCOPIC ULTRASOUND (EUS) LINEAR;  Surgeon: Milus Banister, MD;  Location: [facility];  Service: Endoscopy;  Laterality: N/A;  . EUS  11/11/2012   Procedure: UPPER ENDOSCOPIC ULTRASOUND (EUS) LINEAR;  Surgeon: Milus Banister, MD;  Location: [facility];  Service: Endoscopy;  Laterality: N/A;    Social History   Social History  . Marital status: Widowed    Spouse name: N/A  . Number of children: 4  . Years of education: N/A   Occupational History  . RETIRED Retired   Social History Main Topics  . Smoking status: Never Smoker  . Smokeless tobacco: Never Used  . Alcohol use No  . Drug use: No  . Sexual activity: Not Currently   Other Topics Concern  . Not on file   Social History Narrative  . No narrative on file    Mobility: Utilizes a cane, has a rolling walker but typically will not utilize Work history: Retired   Allergies  Allergen Reactions  . Peach [Prunus Persica] Rash    AGENT: peach fuzz   Family history reviewed and not pertinent to current admission findings   Prior to Admission medications   Medication Sig Start Date End Date Taking? Authorizing Provider  acetaminophen (TYLENOL) 500 MG tablet Take 1,000 mg by mouth 2 (two)  times daily as needed for mild pain.    Yes Historical Provider, MD  atorvastatin (LIPITOR) 20 MG tablet TAKE 1 TABLET BY MOUTH EVERY DAY 04/14/16  Yes [name], MD  gabapentin (NEURONTIN) 300 MG capsule Take 300 mg by mouth at bedtime.  04/09/15  Yes Historical Provider, MD  metolazone (ZAROXOLYN) 2.5 MG tablet Take 1 tablet (2.5 mg total) by mouth 2 (two) times a week. Every Wednesday and Saturday 08/04/16  Yes [name], MD  metoprolol tartrate (LOPRESSOR) 25 MG tablet Take 1 tablet (25 mg total) by mouth 2 (two) times daily. 09/13/15  Yes Amy D Clegg, NP  pantoprazole (PROTONIX) 40 MG tablet Take 1 tablet (40 mg total) by mouth 2 (two) times daily before a meal. 09/07/12  Yes [name], MD  potassium chloride SA (KLOR-CON M20) 20 MEQ tablet Take 1 tablet (20 mEq total) by mouth 2 (two) times daily. Take an extra 2 tabs on Wednesdays Patient taking differently: Take 60 mEq by mouth daily. Take an extra 2 tabs on Wednesday and Saturday 07/16/16  Yes [name], MD  torsemide (DEMADEX) 20 MG tablet Take 80 mg by mouth 2 (two) times daily.   Yes Historical Provider, MD  warfarin (COUMADIN) 2 MG tablet TAKE 1 TABLET BY  MOUTH FOR 5 DAYS A WEEK, THEN 2 TABLETS ON THURSDAY AND SATURDAY Patient taking differently: TAKE 1 TABLET (2 mg) BY MOUTH 6 DAYS A WEEK, THEN 2 TABLETS (4 mg) ON THURSDAY 01/23/16  Yes [name], MD    Physical Exam: Vitals:   08/24/16 1630 08/24/16 1645 08/24/16 1759 08/24/16 1802  BP: 117/93 135/98 (!) 145/93   Pulse: 95 102 (!) 109   Resp: 19 20 18    Temp:   97.7 F (36.5 C)   TempSrc:   Oral   SpO2: 100% 100% 100%   Weight:   104.4 kg (230 lb 3 oz)   Height:    5\' 4"  (1.626 m)      Constitutional: NAD, calm, comfortable Eyes: PERRL, lids and conjunctivae normal ENMT: Mucous membranes are dry. Posterior pharynx clear of any exudate or lesions.Normal dentition.  Neck: normal, supple, no masses, no thyromegaly Respiratory: clear to auscultation  bilaterally, no wheezing, no crackles. Normal respiratory effort. No accessory muscle use.  Cardiovascular: Regular rate and rhythm, no murmurs / rubs / gallops. No extremity edema. 2+ pedal pulses. No carotid bruits.  Abdomen: no tenderness, no masses palpated. No hepatosplenomegaly. Bowel sounds positive.  Musculoskeletal: no clubbing / cyanosis. No joint deformity upper and lower extremities. Good ROM, no contractures. Normal muscle tone.  Skin: no rashes, lesions, ulcers. No induration Neurologic: CN 2-12 grossly intact. Sensation intact, DTR normal. Strength 5/5 x all 4 extremities. Some nonspecific tremulous activity of the left upper extremity with action Psychiatric: Normal judgment and insight. Alert and oriented x 3. Normal mood.    Labs on Admission: I have personally reviewed following labs and imaging studies  CBC:  Recent Labs Lab 08/24/16 1155  WBC 8.8  HGB 10.0*  HCT 33.5*  MCV 98.5  PLT 99991111   Basic Metabolic Panel:  Recent Labs Lab 08/24/16 1155  NA 134*  K 3.8  CL 80*  CO2 41*  GLUCOSE 374*  BUN 51*  CREATININE 2.17*  CALCIUM 9.1   GFR: Estimated Creatinine Clearance: 21.5 mL/min (by C-G formula based on SCr of 2.17 mg/dL (H)). Liver Function Tests: No results for input(s): AST, ALT, ALKPHOS, BILITOT, PROT, ALBUMIN in the last 168 hours. No results for input(s): LIPASE, AMYLASE in the last 168 hours. No results for input(s): AMMONIA in the last 168 hours. Coagulation Profile:  Recent Labs Lab 08/24/16 1307  INR 2.01   Cardiac Enzymes: No results for input(s): CKTOTAL, CKMB, CKMBINDEX, TROPONINI in the last 168 hours. BNP (last 3 results) No results for input(s): PROBNP in the last 8760 hours. HbA1C: No results for input(s): HGBA1C in the last 72 hours. CBG: No results for input(s): GLUCAP in the last 168 hours. Lipid Profile: No results for input(s): CHOL, HDL, LDLCALC, TRIG, CHOLHDL, LDLDIRECT in the last 72 hours. Thyroid Function  Tests: No results for input(s): TSH, T4TOTAL, FREET4, T3FREE, THYROIDAB in the last 72 hours. Anemia Panel: No results for input(s): VITAMINB12, FOLATE, FERRITIN, TIBC, IRON, RETICCTPCT in the last 72 hours. Urine analysis:    Component Value Date/Time   COLORURINE YELLOW 08/24/2016 1356   APPEARANCEUR CLEAR 08/24/2016 1356   LABSPEC 1.009 08/24/2016 1356   PHURINE 6.0 08/24/2016 1356   GLUCOSEU 250 (A) 08/24/2016 1356   HGBUR NEGATIVE 08/24/2016 1356   BILIRUBINUR NEGATIVE 08/24/2016 1356   KETONESUR NEGATIVE 08/24/2016 1356   PROTEINUR NEGATIVE 08/24/2016 1356   UROBILINOGEN 0.2 08/31/2015 1035   NITRITE NEGATIVE 08/24/2016 1356   LEUKOCYTESUR SMALL (A) 08/24/2016 1356  Sepsis Labs: @LABRCNTIP (procalcitonin:4,lacticidven:4) )No results found for this or any previous visit (from the past 240 hour(s)).   Radiological Exams on Admission: Dg Chest 1 View  Result Date: 08/24/2016 CLINICAL DATA:  [place] this morning in bedroom at home, RIGHT shoulder pain, low back pain, question loss of consciousness, history hypertension, CHF, diabetes mellitus, initial encounter EXAM: CHEST 1 VIEW COMPARISON:  09/01/2015 FINDINGS: Enlargement of cardiac silhouette with minimal pulmonary vascular congestion. Atherosclerotic calcification aorta. Mild elevation of RIGHT diaphragm unchanged. Slight chronic accentuation of mid lung pulmonary markings. No definite acute infiltrate, pleural effusion or pneumothorax. Bones diffusely demineralized without definite fracture. IMPRESSION: Enlargement of cardiac silhouette with pulmonary vascular congestion. No acute abnormalities. Aortic atherosclerosis. Electronically Signed   By: Lavonia Dana M.D.   On: 08/24/2016 16:14   Dg Thoracic Spine W/swimmers  Result Date: 08/24/2016 CLINICAL DATA:  [place] this morning in bedroom at home, RIGHT shoulder pain, low back pain, question loss of consciousness, history hypertension, CHF, diabetes mellitus, initial encounter EXAM:  THORACIC SPINE - 3 VIEWS COMPARISON:  - new mild superior endplate compression fracture of T6. Electronically Signed   By: Lavonia Dana M.D.   On: 08/24/2016 16:18   Dg Sacrum/coccyx  Result Date: 08/24/2016 CLINICAL DATA:  [place] this morning in bedroom at home, RIGHT shoulder pain, low back pain, question loss of consciousness, history hypertension, CHF, diabetes mellitus, initial encounter EXAM: SACRUM AND COCCYX - 2+ VIEW COMPARISON:  CT abdomen pelvis 09/02/2012 FINDINGS: Osseous demineralization. SI joints preserved. Mild narrowing of hip joints bilaterally. Sacral foramina symmetric. No definite sacrococcygeal fracture identified though assessment of the distal sacrum is suboptimal. IMPRESSION: No definite acute osseous abnormalities. Electronically Signed   By: Lavonia Dana M.D.   On: 08/24/2016 16:21   Dg Shoulder Right  Result Date: 08/24/2016 CLINICAL DATA:  [place] this morning in bedroom at home, RIGHT shoulder pain, low back pain, question loss of consciousness, history hypertension, CHF, diabetes mellitus, initial encounter EXAM: RIGHT SHOULDER - 2+ VIEW COMPARISON:  None FINDINGS: Osseous demineralization. AC joint alignment normal. No acute fracture, dislocation, or bone destruction. Visualized RIGHT ribs intact. Atherosclerotic calcifications of the RIGHT carotid system incidentally noted. IMPRESSION: No acute osseous abnormalities. RIGHT carotid atherosclerotic calcifications. Electronically Signed   By: Lavonia Dana M.D.   On: 08/24/2016 16:20   Ct Head Wo Contrast  Result Date: 08/24/2016 CLINICAL DATA:  Patient fell. Amnestic for event. Patient reports neck pain. EXAM: CT HEAD WITHOUT CONTRAST CT CERVICAL SPINE WITHOUT CONTRAST TECHNIQUE: Multidetector CT imaging of the head and cervical spine was performed following the standard protocol without intravenous contrast. Multiplanar CT image reconstructions of the cervical spine were also generated. COMPARISON:  05/19/2015. FINDINGS: CT HEAD  FINDINGS Brain: No evidence of acute infarction, hemorrhage, hydrocephalus, extra-axial collection or mass lesion/mass effect. Generalized atrophy. Chronic microvascular ischemic change. Vascular: No hyperdense vessel.  Advanced vascular calcification. Skull: Normal. Negative for fracture or focal lesion. Sinuses/Orbits: No acute finding. Other: None. CT CERVICAL SPINE FINDINGS Alignment: Reversal of the normal cervical lordotic curve. Degenerative anterolisthesis C2-3. Degenerative retrolisthesis C3-4, C4-5, and C5-6. Vision anterolisthesis C7-T1. Skull base and vertebrae: No acute fracture. No primary bone lesion or focal pathologic process. Soft tissues and spinal canal: No intraspinal hematoma is evident. There is advanced atherosclerosis. Disc levels: All disc spaces demonstrate narrowing. Calcific protrusions at C3-4 and C4-5 potentially contribute to stenosis. Multilevel facet arthropathy appears worst at C2-3 on the LEFT, and C7-T1 bilaterally. Upper chest: No lung apex lesion. Prominence of the transverse arch,  incompletely evaluated, suggesting aneurysmal dilatation. Other: Unremarkable. Compared with 2016, similar appearance. IMPRESSION: No skull fracture or intracranial hemorrhage. Atrophy and small vessel disease. No cervical spine fracture or traumatic subluxation. Advanced spondylosis. Dilated transverse arch of the aorta. Consider further evaluation with CTA chest on elective basis. Electronically Signed   By: Staci Righter M.D.   On: 08/24/2016 16:01   Ct Cervical Spine Wo Contrast  Result Date: 08/24/2016 CLINICAL DATA:  Patient fell. Amnestic for event. Patient reports neck pain. EXAM: CT HEAD WITHOUT CONTRAST CT CERVICAL SPINE WITHOUT CONTRAST TECHNIQUE: Multidetector CT imaging of the head and cervical spine was performed following the standard protocol without intravenous contrast. Multiplanar CT image reconstructions of the cervical spine were also generated. COMPARISON:  05/19/2015.  FINDINGS: CT HEAD FINDINGS Brain: No evidence of acute infarction, hemorrhage, hydrocephalus, extra-axial collection or mass lesion/mass effect. Generalized atrophy. Chronic microvascular ischemic change. Vascular: No hyperdense vessel.  Advanced vascular calcification. Skull: Normal. Negative for fracture or focal lesion. Sinuses/Orbits: No acute finding. Other: None. CT CERVICAL SPINE FINDINGS Alignment: Reversal of the normal cervical lordotic curve. Degenerative anterolisthesis C2-3. Degenerative retrolisthesis C3-4, C4-5, and C5-6. Vision anterolisthesis C7-T1. Skull base and vertebrae: No acute fracture. No primary bone lesion or focal pathologic process. Soft tissues and spinal canal: No intraspinal hematoma is evident. There is advanced atherosclerosis. Disc levels: All disc spaces demonstrate narrowing. Calcific protrusions at C3-4 and C4-5 potentially contribute to stenosis. Multilevel facet arthropathy appears worst at C2-3 on the LEFT, and C7-T1 bilaterally. Upper chest: No lung apex lesion. Prominence of the transverse arch, incompletely evaluated, suggesting aneurysmal dilatation. Other: Unremarkable. Compared with 2016, similar appearance. IMPRESSION: No skull fracture or intracranial hemorrhage. Atrophy and small vessel disease. No cervical spine fracture or traumatic subluxation. Advanced spondylosis. Dilated transverse arch of the aorta. Consider further evaluation with CTA chest on elective basis. Electronically Signed   By: Staci Righter M.D.   On: 08/24/2016 16:01    EKG: (Independently reviewed) atrial fibrillation with ventricular rate 107 bpm, QTC 441 ms, nonspecific ST and T wave changes but some of these are difficult to chilly elucidate given underlying baseline interference on tracing  Assessment/Plan Principal Problem:   Syncope and collapse -Patient presents with syncope that developed after patient went from sitting to standing position with reports of coughing episode prior to  syncopal event; patient also has lab data concerning for volume depletion and acute kidney injury although was not orthostatic when checked in the ER (after given fluid challenge) -Suspect syncope mediated by dehydration -Patient does have history of atrial fibrillation so we'll monitor telemetry for arrhythmia -Echocardiogram -No Chest pain and no indication to pursue ischemic evaluation or cycle troponin -No focal neurological deficits and initial CT negative so no indication to pursue MRI  Active Problems:   Acute kidney injury on CKD (chronic kidney disease) stage 4, GFR 15-29 ml/min  -Baseline renal function has been 27/1.44 on average -At the end of August BUN had increased to 44 with a creatinine of 1.61 -Current renal function 51/2.17 -Hold Demadex and Zaroxolyn Lab Results  Component Value Date   CREATININE 2.17 (H) 08/24/2016   CREATININE 1.61 (H) 08/04/2016   CREATININE 1.32 (H) 07/23/2016      Acute T6 compression fracture -Patient is not reporting back pain -I have ordered Tylenol for mild pain, Ultram for moderate pain and low-dose oxycodone for severe pain -PT/OT evaluation    Diabetes mellitus 2, uncontrolled with complication -CBG has increased to greater than 300 since arrival -Based  on medication reconciliation patient does not appear to be on diabetic medications but I suspect she is not on any medications due to history of symptomatic hypoglycemia in the past -Need to investigate whether insulin has been tried and/ or Tradjenta -Follow CBGs and provide SS I -Check hemoglobin A1c    Chronic atrial fibrillation- CHADs VASc =6 -Mildly tachycardic and likely related to dehydration    Mitral valve regurgitation with secondary PH/Chronic diastolic congestive heart failure /Pulmonary hypertension, moderate to severe  -Currently compensated -Diuretics on hold for now    HYPERCHOLESTEROLEMIA -Continue preadmission Lipitor    Severe obesity (BMI >= 40)   -Discussed with daughter after initial evaluation; she has concerns over patient's progressive deconditioning. She is only caretaker for patient. Patient may be eligible for skilled nursing care. -PT/OT evaluation -Admitted under observation status but if PT recommends skilled nursing placement given she was admitted with acute kidney injury and syncope as well as poorly controlled diabetes plus a new T6 compression fracture we may be justified changing her to inpatient status      DVT prophylaxis: Warfarin Code Status: Full Family Communication: Daughter [name] at bedside as well as other family members  Disposition Plan: Pending PT/OT evaluation patient may discharge to skilled nursing facility at least for rehabilitative therapies Consults called: None    Leza Apsey J ANP-BC [facility] Pager [phone]   If 7PM-7AM, please contact night-coverage [email or web address] Password [facility]  08/24/2016, 8:12 PM  Care during the described time interval was provided by ANP Erin Hearing and myself .  I have reviewed this patient's available data, including medical history, events of note, physical examination, and all test results as part of my evaluation. I have personally reviewed and interpreted all radiology studies.

## 2016-08-24 NOTE — ED Notes (Signed)
CBG 389

## 2016-08-24 NOTE — ED Triage Notes (Signed)
Per EMS - pt from home. Pt walking to restroom and fell, uncertain as to how she fell, states "she just remembers finding self on floor." Pt daughter called EMS b/c pt was "not acting right." Answered questions appropriately w/ EMS. A&O x 4 upon arrival to ED. CBG 446 w/ EMS, hx diabetes but told to stop taking meds by MD. Denies pain at this time, had initially told EMS she had pain in right scapula and buttocks. Afib on monitor, hx same.

## 2016-08-24 NOTE — ED Notes (Signed)
Pt returned from scans.  

## 2016-08-24 NOTE — ED Provider Notes (Signed)
[facility] DEPT Provider Note   CSN: [id number] Arrival date & time: 08/24/16  1157     History   Chief Complaint Chief Complaint  Patient presents with  . Fall  . Hyperglycemia    HPI [name] is a 82 y.o. female.  HPI  82 year old female presents with a fall. Patient tells me that she got up out of her chair and the next thing she murmurs she was on the floor. She thinks she passed out briefly. Her daughter heard her fall upstairs and went to go check on her she was already awake. Has a history of atrophic fibrillation, CHF and is on warfarin. Patient has been having a headache on and off for the last couple days, none currently. She has also been intermittently confused sometimes seeing people that are not there. This has been for about one week according to the daughter. Has been feeing a lot but this is likely due to her diuretic according to family. No fevers. Complaining of neck pain, buttocks pain, and shoulder pain. Also feeling generally weak and fatigued for the last couple days.  Past Medical History:  Diagnosis Date  . Anemia   . Arthritis   . Breast cancer ([place]) 2012   right  . CHF (congestive heart failure) ([place])   . Cholelithiasis   . DCIS (ductal carcinoma in situ) of breast 07/15/2010   DCIS s/p lumpectomy and radiation 2011  . Diabetes mellitus   . DVT (deep venous thrombosis) ([place])   . GERD (gastroesophageal reflux disease)   . GI bleed   . Heart murmur   . Hyperlipidemia   . Hypertension   . Peripheral neuropathy ([place])   . Shortness of breath dyspnea   . Syncope 05/09/2015    Patient Active Problem List   Diagnosis Date Noted  . Syncope 08/24/2016  . Chronic diastolic congestive heart failure ([place]) 02/28/2016  . Dizziness 09/13/2015  . UTI (lower urinary tract infection)   . CHF (congestive heart failure) ([place])   . Palliative care encounter   . Multisystem organ failure   . Cardiomyopathy ([place])   . Pulmonary hypertension ([place])   .  Tricuspid valve regurgitation   . Pyelonephritis 08/31/2015  . Atrial fibrillation with RVR ([place]) 08/31/2015  . Subtherapeutic international normalized ratio (INR) 08/31/2015  . Atrial fibrillation ([place]) 08/28/2015  . Chronic diastolic heart failure ([place]) 08/28/2015  . 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 Normal coronary arteries-June 2016 08/12/2015  . Hyperchloremia   . Severe obesity (BMI >= 40) ([place]) 06/09/2015  . 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 Anterior dislocation of left shoulder 05/19/2015  . Pulmonary hypertension, moderate to severe ([place]) 05/19/2015  . Syncope and collapse 05/09/2015  . Choledocholithiasis 10/14/2012  . 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 Duodenal ulcer with hemorrhage 09/03/2012  . Calculus of bile duct without mention of cholecystitis or obstruction 09/02/2012  . Calculus of gallbladder without mention of cholecystitis or obstruction 09/02/2012  . Hemorrhage of gastrointestinal tract, unspecified 08/30/2012  . Chronic anticoagulation-Warfarin 08/30/2012  . 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 HYPERCHOLESTEROLEMIA 02/08/2010  . Hypertensive cardiovascular disease 02/08/2010  . Chronic atrial fibrillation- CHADs VASc =6 02/08/2010  . Mitral valve regurgitation with secondary [facility] 02/08/2010    Past Surgical History:  Procedure Laterality Date  . BREAST SURGERY    . CARDIAC CATHETERIZATION N/A 05/11/2015   Procedure: Left Heart Cath and Coronary Angiography;  Surgeon: [name], MD;  Location: [facility]  CV LAB;  Service: Cardiovascular;  Laterality: N/A;  . CARDIAC CATHETERIZATION N/A 08/09/2015   Procedure: Right Heart Cath;  Surgeon: [name], MD;  Location: [facility] CV LAB;  Service: Cardiovascular;  Laterality: N/A;  . CATARACT EXTRACTION Left 05/08/2015  . ENDOSCOPIC RETROGRADE CHOLANGIOPANCREATOGRAPHY (ERCP) WITH PROPOFOL  10/14/2012   Procedure: ENDOSCOPIC RETROGRADE  CHOLANGIOPANCREATOGRAPHY (ERCP) WITH PROPOFOL;  Surgeon: Milus Banister, MD;  Location: [facility] ENDOSCOPY;  Service: Endoscopy;  Laterality: N/A;  . ESOPHAGOGASTRODUODENOSCOPY  09/01/2012   Procedure: ESOPHAGOGASTRODUODENOSCOPY (EGD);  Surgeon: Ladene Artist, MD,FACG;  Location: [facility] ENDOSCOPY;  Service: Endoscopy;  Laterality: N/A;  . ESOPHAGOGASTRODUODENOSCOPY  09/03/2012   Procedure: ESOPHAGOGASTRODUODENOSCOPY (EGD);  Surgeon: Inda Castle, MD;  Location: [facility];  Service: Endoscopy;  Laterality: N/A;  . EUS  10/14/2012   Procedure: UPPER ENDOSCOPIC ULTRASOUND (EUS) LINEAR;  Surgeon: Milus Banister, MD;  Location: [facility] ENDOSCOPY;  Service: Endoscopy;  Laterality: N/A;  . EUS  11/11/2012   Procedure: UPPER ENDOSCOPIC ULTRASOUND (EUS) LINEAR;  Surgeon: Milus Banister, MD;  Location: [facility] ENDOSCOPY;  Service: Endoscopy;  Laterality: N/A;    OB History    No data available       Home Medications    Prior to Admission medications   Medication Sig Start Date End Date Taking? Authorizing Provider  acetaminophen (TYLENOL) 500 MG tablet Take 1,000 mg by mouth 2 (two) times daily as needed for mild pain.    Yes Historical Provider, MD  atorvastatin (LIPITOR) 20 MG tablet TAKE 1 TABLET BY MOUTH EVERY DAY 04/14/16  Yes [name], MD  gabapentin (NEURONTIN) 300 MG capsule Take 300 mg by mouth at bedtime.  04/09/15  Yes Historical Provider, MD  metolazone (ZAROXOLYN) 2.5 MG tablet Take 1 tablet (2.5 mg total) by mouth 2 (two) times a week. Every Wednesday and Saturday 08/04/16  Yes [name], MD  metoprolol tartrate (LOPRESSOR) 25 MG tablet Take 1 tablet (25 mg total) by mouth 2 (two) times daily. 09/13/15  Yes Amy D Clegg, NP  pantoprazole (PROTONIX) 40 MG tablet Take 1 tablet (40 mg total) by mouth 2 (two) times daily before a meal. 09/07/12  Yes [name], MD  potassium chloride SA (KLOR-CON M20) 20 MEQ tablet Take 1 tablet (20 mEq total) by mouth 2 (two) times daily. Take an extra 2 tabs  on Wednesdays Patient taking differently: Take 60 mEq by mouth daily. Take an extra 2 tabs on Wednesday and Saturday 07/16/16  Yes [name], MD  torsemide (DEMADEX) 20 MG tablet Take 80 mg by mouth 2 (two) times daily.   Yes Historical Provider, MD  warfarin (COUMADIN) 2 MG tablet TAKE 1 TABLET BY MOUTH FOR 5 DAYS A WEEK, THEN 2 TABLETS ON THURSDAY AND SATURDAY Patient taking differently: TAKE 1 TABLET (2 mg) BY MOUTH 6 DAYS A WEEK, THEN 2 TABLETS (4 mg) ON THURSDAY 01/23/16  Yes [name], MD    Family History History reviewed. No pertinent family history.  Social History Social History  Substance Use Topics  . Smoking status: Never Smoker  . Smokeless tobacco: Never Used  . Alcohol use No     Allergies   Peach [prunus persica]   Review of Systems Review of Systems   Physical Exam Updated Vital Signs BP 117/93   Pulse 95   Temp 98 F (36.7 C) (Oral)   Resp 19   SpO2 100%   Physical Exam   ED Treatments / Results  Labs (all  labs ordered are listed, but only abnormal results are displayed) Labs Reviewed  BASIC METABOLIC PANEL - Abnormal; Notable for the following:       Result Value   Sodium 134 (*)    Chloride 80 (*)    CO2 41 (*)    Glucose, Bld 374 (*)    BUN 51 (*)    Creatinine, Ser 2.17 (*)    GFR calc non Af Amer 19 (*)    GFR calc Af Amer 22 (*)    All other components within normal limits  CBC - Abnormal; Notable for the following:    RBC 3.40 (*)    Hemoglobin 10.0 (*)    HCT 33.5 (*)    MCHC 29.9 (*)    All other components within normal limits  URINALYSIS, ROUTINE W REFLEX MICROSCOPIC (NOT AT [facility]) - Abnormal; Notable for the following:    Glucose, UA 250 (*)    Leukocytes, UA SMALL (*)    All other components within normal limits  PROTIME-INR - Abnormal; Notable for the following:    Prothrombin Time 23.1 (*)    All other components within normal limits  BRAIN NATRIURETIC PEPTIDE - Abnormal; Notable for the following:    B  Natriuretic Peptide 244.5 (*)    All other components within normal limits  URINE MICROSCOPIC-ADD ON - Abnormal; Notable for the following:    Squamous Epithelial / LPF 0-5 (*)    All other components within normal limits  CBG MONITORING, ED  I-STAT TROPOININ, ED    EKG  EKG Interpretation  Date/Time:  Sunday August 24 2016 11:59:07 EDT Ventricular Rate:  107 PR Interval:    QRS Duration: 116 QT Interval:  330 QTC Calculation: 441 R Axis:   75 Text Interpretation:  Atrial fibrillation Nonspecific intraventricular conduction delay Repol abnrm suggests ischemia, lateral leads Artifact similar to Sept 2016 Confirmed by Regenia Skeeter MD, [place] [phone]) on 08/24/2016 12:13:12 PM       Radiology Dg Chest 1 View  Result Date: 08/24/2016 CLINICAL DATA:  [place] this morning in bedroom at home, RIGHT shoulder pain, low back pain, question loss of consciousness, history hypertension, CHF, diabetes mellitus, initial encounter EXAM: CHEST 1 VIEW COMPARISON:  09/01/2015 FINDINGS: Enlargement of cardiac silhouette with minimal pulmonary vascular congestion. Atherosclerotic calcification aorta. Mild elevation of RIGHT diaphragm unchanged. Slight chronic accentuation of mid lung pulmonary markings. No definite acute infiltrate, pleural effusion or pneumothorax. Bones diffusely demineralized without definite fracture. IMPRESSION: Enlargement of cardiac silhouette with pulmonary vascular congestion. No acute abnormalities. Aortic atherosclerosis. Electronically Signed   By: Lavonia Dana M.D.   On: 08/24/2016 16:14   Dg Thoracic Spine W/swimmers  Result Date: 08/24/2016 CLINICAL DATA:  [place] this morning in bedroom at home, RIGHT shoulder pain, low back pain, question loss of consciousness, history hypertension, CHF, diabetes mellitus, initial encounter EXAM: THORACIC SPINE - 3 VIEWS COMPARISON:  Chest radiographs 09/01/2015, 08/31/2015, 06/08/2015 FINDINGS: Osseous demineralization. Twelve pairs of ribs.  Multilevel disc space narrowing and endplate spur formation. Superior endplate compression fracture of T6 vertebral body with mild anterior height loss, new since prior exams. No additional fracture or subluxation. Degenerative disc disease changes of the cervical spine as well. IMPRESSION: Osseous demineralization with scattered degenerative changes and a new mild superior endplate compression fracture of T6. Electronically Signed   By: Lavonia Dana M.D.   On: 08/24/2016 16:18   Dg Sacrum/coccyx  Result Date: 08/24/2016 CLINICAL DATA:  [place] this morning in bedroom at home, RIGHT shoulder pain,  low back pain, question loss of consciousness, history hypertension, CHF, diabetes mellitus, initial encounter EXAM: SACRUM AND COCCYX - 2+ VIEW COMPARISON:  CT abdomen pelvis 09/02/2012 FINDINGS: Osseous demineralization. SI joints preserved. Mild narrowing of hip joints bilaterally. Sacral foramina symmetric. No definite sacrococcygeal fracture identified though assessment of the distal sacrum is suboptimal. IMPRESSION: No definite acute osseous abnormalities. Electronically Signed   By: Lavonia Dana M.D.   On: 08/24/2016 16:21   Dg Shoulder Right  Result Date: 08/24/2016 CLINICAL DATA:  [place] this morning in bedroom at home, RIGHT shoulder pain, low back pain, question loss of consciousness, history hypertension, CHF, diabetes mellitus, initial encounter EXAM: RIGHT SHOULDER - 2+ VIEW COMPARISON:  None FINDINGS: Osseous demineralization. AC joint alignment normal. No acute fracture, dislocation, or bone destruction. Visualized RIGHT ribs intact. Atherosclerotic calcifications of the RIGHT carotid system incidentally noted. IMPRESSION: No acute osseous abnormalities. RIGHT carotid atherosclerotic calcifications. Electronically Signed   By: Lavonia Dana M.D.   On: 08/24/2016 16:20   Ct Head Wo Contrast  Result Date: 08/24/2016 CLINICAL DATA:  Patient fell. Amnestic for event. Patient reports neck pain. EXAM: CT HEAD  WITHOUT CONTRAST CT CERVICAL SPINE WITHOUT CONTRAST TECHNIQUE: Multidetector CT imaging of the head and cervical spine was performed following the standard protocol without intravenous contrast. Multiplanar CT image reconstructions of the cervical spine were also generated. COMPARISON:  05/19/2015. FINDINGS: CT HEAD FINDINGS Brain: No evidence of acute infarction, hemorrhage, hydrocephalus, extra-axial collection or mass lesion/mass effect. Generalized atrophy. Chronic microvascular ischemic change. Vascular: No hyperdense vessel.  Advanced vascular calcification. Skull: Normal. Negative for fracture or focal lesion. Sinuses/Orbits: No acute finding. Other: None. CT CERVICAL SPINE FINDINGS Alignment: Reversal of the normal cervical lordotic curve. Degenerative anterolisthesis C2-3. Degenerative retrolisthesis C3-4, C4-5, and C5-6. Vision anterolisthesis C7-T1. Skull base and vertebrae: No acute fracture. No primary bone lesion or focal pathologic process. Soft tissues and spinal canal: No intraspinal hematoma is evident. There is advanced atherosclerosis. Disc levels: All disc spaces demonstrate narrowing. Calcific protrusions at C3-4 and C4-5 potentially contribute to stenosis. Multilevel facet arthropathy appears worst at C2-3 on the LEFT, and C7-T1 bilaterally. Upper chest: No lung apex lesion. Prominence of the transverse arch, incompletely evaluated, suggesting aneurysmal dilatation. Other: Unremarkable. Compared with 2016, similar appearance. IMPRESSION: No skull fracture or intracranial hemorrhage. Atrophy and small vessel disease. No cervical spine fracture or traumatic subluxation. Advanced spondylosis. Dilated transverse arch of the aorta. Consider further evaluation with CTA chest on elective basis. Electronically Signed   By: Staci Righter M.D.   On: 08/24/2016 16:01   Ct Cervical Spine Wo Contrast  Result Date: 08/24/2016 CLINICAL DATA:  Patient fell. Amnestic for event. Patient reports neck pain.  EXAM: CT HEAD WITHOUT CONTRAST CT CERVICAL SPINE WITHOUT CONTRAST TECHNIQUE: Multidetector CT imaging of the head and cervical spine was performed following the standard protocol without intravenous contrast. Multiplanar CT image reconstructions of the cervical spine were also generated. COMPARISON:  05/19/2015. FINDINGS: CT HEAD FINDINGS Brain: No evidence of acute infarction, hemorrhage, hydrocephalus, extra-axial collection or mass lesion/mass effect. Generalized atrophy. Chronic microvascular ischemic change. Vascular: No hyperdense vessel.  Advanced vascular calcification. Skull: Normal. Negative for fracture or focal lesion. Sinuses/Orbits: No acute finding. Other: None. CT CERVICAL SPINE FINDINGS Alignment: Reversal of the normal cervical lordotic curve. Degenerative anterolisthesis C2-3. Degenerative retrolisthesis C3-4, C4-5, and C5-6. Vision anterolisthesis C7-T1. Skull base and vertebrae: No acute fracture. No primary bone lesion or focal pathologic process. Soft tissues and spinal canal: No intraspinal hematoma is evident.  There is advanced atherosclerosis. Disc levels: All disc spaces demonstrate narrowing. Calcific protrusions at C3-4 and C4-5 potentially contribute to stenosis. Multilevel facet arthropathy appears worst at C2-3 on the LEFT, and C7-T1 bilaterally. Upper chest: No lung apex lesion. Prominence of the transverse arch, incompletely evaluated, suggesting aneurysmal dilatation. Other: Unremarkable. Compared with 2016, similar appearance. IMPRESSION: No skull fracture or intracranial hemorrhage. Atrophy and small vessel disease. No cervical spine fracture or traumatic subluxation. Advanced spondylosis. Dilated transverse arch of the aorta. Consider further evaluation with CTA chest on elective basis. Electronically Signed   By: Staci Righter M.D.   On: 08/24/2016 16:01    Procedures Procedures (including critical care time)  Medications Ordered in ED Medications  sodium chloride 0.9 %  bolus 500 mL (500 mLs Intravenous New Bag/Given 08/24/16 1630)  acetaminophen (TYLENOL) tablet 650 mg (650 mg Oral Given 08/24/16 1630)     Initial Impression / Assessment and Plan / ED Course  I have reviewed the triage vital signs and the nursing notes.  Pertinent labs & imaging results that were available during my care of the patient were reviewed by me and considered in my medical decision making (see chart for details).  Clinical Course  Comment By Time  Will perform syncope workup. Also we'll check urine given intermittent confusion. CT had given she is on warfarin. Will x-ray multiple painful areas and anticipate probable admission. Sherwood Gambler, MD 09/17 1222    Labwork shows acute on chronic kidney disease. Gentle fluids. With her CHF, afib, she is at risk for arrhythmias that could have caused sudden syncope without prodrome. No obvious cause for her intermittent confusion. Admit to hospitalist to tele obs  Final Clinical Impressions(s) / ED Diagnoses   Final diagnoses:  Fall, initial encounter  Syncope, unspecified syncope type  Atrial fibrillation, unspecified type ([place])  Acute kidney injury superimposed on chronic kidney disease ([place])    New Prescriptions New Prescriptions   No medications on file     Sherwood Gambler, MD 08/24/16 1650

## 2016-08-24 NOTE — ED Notes (Signed)
Pt remains at scans.

## 2016-08-24 NOTE — Progress Notes (Signed)
ANTICOAGULATION CONSULT NOTE - Initial Consult  Pharmacy Consult for Coumadin Indication: atrial fibrillation  Allergies  Allergen Reactions  . Peach [Prunus Persica] Rash    AGENT: peach fuzz    Patient Measurements:   Heparin Dosing Weight: n/a  Vital Signs: Temp: 98 F (36.7 C) (09/17 1203) Temp Source: Oral (09/17 1203) BP: 117/93 (09/17 1630) Pulse Rate: 95 (09/17 1630)  Labs:  Recent Labs  08/24/16 1155 08/24/16 1307  HGB 10.0*  --   HCT 33.5*  --   PLT 205  --   LABPROT  --  23.1*  INR  --  2.01  CREATININE 2.17*  --     CrCl cannot be calculated (Unknown ideal weight.).   Medical History: Past Medical History:  Diagnosis Date  . Anemia   . Arthritis   . Breast cancer ([place]) 2012   right  . CHF (congestive heart failure) ([place])   . Cholelithiasis   . DCIS (ductal carcinoma in situ) of breast 07/15/2010   DCIS s/p lumpectomy and radiation 2011  . Diabetes mellitus   . DVT (deep venous thrombosis) ([place])   . GERD (gastroesophageal reflux disease)   . GI bleed   . Heart murmur   . Hyperlipidemia   . Hypertension   . Peripheral neuropathy ([place])   . Shortness of breath dyspnea   . Syncope 05/09/2015    Assessment: 82 yo female on chronic Coumadin for afib.  Admitted with fall.  INR therapeutic today. PTA Coumadin dose 2mg  daily except 4 mg on Thursdays.  Spoke with patient/daughter, she has had her dose today already.  No bleeding or complications noted.  Goal of Therapy:  INR 2-3 Monitor platelets by anticoagulation protocol: Yes   Plan:  1. No further Coumadin tonight. 2,. Daily PT/INR.  Uvaldo Rising, BCPS  Clinical Pharmacist Pager [phone]  08/24/2016 5:02 PM

## 2016-08-25 ENCOUNTER — Observation Stay (HOSPITAL_BASED_OUTPATIENT_CLINIC_OR_DEPARTMENT_OTHER): Payer: Commercial Managed Care - HMO

## 2016-08-25 DIAGNOSIS — I4891 Unspecified atrial fibrillation: Secondary | ICD-10-CM | POA: Diagnosis not present

## 2016-08-25 DIAGNOSIS — N179 Acute kidney failure, unspecified: Secondary | ICD-10-CM | POA: Diagnosis not present

## 2016-08-25 LAB — BASIC METABOLIC PANEL
Anion gap: 12 (ref 5–15)
BUN: 46 mg/dL — AB (ref 6–20)
CALCIUM: 9.1 mg/dL (ref 8.9–10.3)
CHLORIDE: 79 mmol/L — AB (ref 101–111)
CO2: 45 mmol/L — AB (ref 22–32)
CREATININE: 1.76 mg/dL — AB (ref 0.44–1.00)
GFR calc non Af Amer: 25 mL/min — ABNORMAL LOW (ref >60)
GFR, EST AFRICAN AMERICAN: 29 mL/min — AB (ref >60)
GLUCOSE: 270 mg/dL — AB (ref 65–99)
Potassium: 3.4 mmol/L — ABNORMAL LOW (ref 3.5–5.1)
Sodium: 136 mmol/L (ref 135–145)

## 2016-08-25 LAB — CBC
HCT: 34.9 % — ABNORMAL LOW (ref 36.0–46.0)
Hemoglobin: 10.4 g/dL — ABNORMAL LOW (ref 12.0–15.0)
MCH: 29.5 pg (ref 26.0–34.0)
MCHC: 29.8 g/dL — AB (ref 30.0–36.0)
MCV: 98.9 fL (ref 78.0–100.0)
PLATELETS: 197 10*3/uL (ref 150–400)
RBC: 3.53 MIL/uL — ABNORMAL LOW (ref 3.87–5.11)
RDW: 13.4 % (ref 11.5–15.5)
WBC: 8.8 10*3/uL (ref 4.0–10.5)

## 2016-08-25 LAB — GLUCOSE, CAPILLARY
GLUCOSE-CAPILLARY: 218 mg/dL — AB (ref 65–99)
GLUCOSE-CAPILLARY: 185 mg/dL — AB (ref 65–99)
Glucose-Capillary: 389 mg/dL — ABNORMAL HIGH (ref 65–99)
Glucose-Capillary: 208 mg/dL — ABNORMAL HIGH (ref 65–99)
Glucose-Capillary: 181 mg/dL — ABNORMAL HIGH (ref 65–99)

## 2016-08-25 LAB — PROTIME-INR
INR: 2.06
Prothrombin Time: 23.5 seconds — ABNORMAL HIGH (ref 11.4–15.2)

## 2016-08-25 LAB — ECHOCARDIOGRAM COMPLETE
HEIGHTINCHES: 64 in
WEIGHTICAEL: 3691.2 [oz_av]

## 2016-08-25 MED ORDER — PERFLUTREN LIPID MICROSPHERE
INTRAVENOUS | Status: AC
  Filled 2016-08-25: qty 10

## 2016-08-25 MED ORDER — SODIUM CHLORIDE 0.9 % IV SOLN
INTRAVENOUS | Status: DC
  Administered 2016-08-25: 50 mL/h via INTRAVENOUS

## 2016-08-25 MED ORDER — PERFLUTREN LIPID MICROSPHERE
1.0000 mL | INTRAVENOUS | Status: AC | PRN
  Administered 2016-08-25: 3 mL via INTRAVENOUS
  Filled 2016-08-25: qty 10

## 2016-08-25 MED ORDER — WARFARIN SODIUM 2 MG PO TABS
2.0000 mg | ORAL_TABLET | Freq: Once | ORAL | Status: AC
  Administered 2016-08-25: 2 mg via ORAL
  Filled 2016-08-25: qty 1

## 2016-08-25 MED ORDER — METOPROLOL TARTRATE 12.5 MG HALF TABLET
12.5000 mg | ORAL_TABLET | Freq: Two times a day (BID) | ORAL | Status: DC
  Administered 2016-08-25 – 2016-08-28 (×6): 12.5 mg via ORAL
  Filled 2016-08-25 (×6): qty 1

## 2016-08-25 NOTE — Progress Notes (Signed)
  Echocardiogram 2D Echocardiogram has been performed with definity.  Aggie Cosier 08/25/2016, 2:49 PM

## 2016-08-25 NOTE — Care Management Obs Status (Signed)
[facility] NOTIFICATION   Patient Details  Name: KAROLEE MESKE MRN: [id number] Date of Birth: Sep 28, 1926   Medicare Observation Status Notification Given:  Yes    Dawayne Patricia, RN 08/25/2016, 4:40 PM

## 2016-08-25 NOTE — Evaluation (Signed)
Physical Therapy Evaluation Patient Details Name: [name] MRN: [id number] DOB: Aug 25, 1926 Today's Date: 08/25/2016   History of Present Illness  82 yo admitted with syncope. Found to have T6 compression fx. PMHx: Afib, CHF, HTN, DM  Clinical Impression  [name] is very pleasant and willing to mobilize but limited at this time by orthostatic hypotension. Pt reports assist needed at home and being confined to upstairs unless 2 person assist available for stairs. Pt with generalized weakness, inability to perform basic transfers on her own, unable to attempt ambulation at this time due to BP and decreased function who will benefit from acute therapy to maximize mobility and strength.   Orthostatic BPs  Supine 106/65  Sitting 92/58     Standing 51/39          Follow Up Recommendations SNF;Supervision/Assistance - 24 hour    Equipment Recommendations  None recommended by PT    Recommendations for Other Services OT consult     Precautions / Restrictions Precautions Precautions: Fall Precaution Comments: watch BP- orthostatic on eval      Mobility  Bed Mobility Overal bed mobility: Needs Assistance Bed Mobility: Rolling;Sidelying to Sit;Sit to Sidelying Rolling: Min assist Sidelying to sit: Min assist     Sit to sidelying: Mod assist General bed mobility comments: cues for sequence for back precautions, assist to elevate legs onto surface, assist to scoot to [facility], tactile cueing and limited assist with rolling and rising  Transfers Overall transfer level: Needs assistance   Transfers: Sit to/from Stand Sit to Stand: Min guard         General transfer comment: cues for hand placement and safety  Ambulation/Gait Ambulation/Gait assistance:  (unable secondary to orthostatic BP)              Stairs            Wheelchair Mobility    Modified Rankin (Stroke Patients Only)       Balance Overall balance assessment: Needs assistance    Sitting balance-Leahy Scale: Good       Standing balance-Leahy Scale: Poor                               Pertinent Vitals/Pain Pain Assessment: No/denies pain    Home Living Family/patient expects to be discharged to:: Private residence Living Arrangements: Children Available Help at Discharge: Family;Available PRN/intermittently Type of Home: House Home Access: Ramped entrance     Home Layout: Two level;Bed/bath upstairs Home Equipment: Walker - 2 wheels;Cane - single point;Wheelchair - [employer] - 4 wheels;Shower seat      Prior Function Level of Independence: Needs assistance   Gait / Transfers Assistance Needed: pt states she uses cane inside, stays upstairs in her bedroom and requires 2 person assist for stairs to exit house  ADL's / Homemaking Assistance Needed: pt states she has assist with bathing and dressing        Hand Dominance        Extremity/Trunk Assessment   Upper Extremity Assessment: Generalized weakness           Lower Extremity Assessment: Generalized weakness      Cervical / Trunk Assessment: Kyphotic  Communication   Communication: No difficulties  Cognition Arousal/Alertness: Awake/alert Behavior During Therapy: Flat affect Overall Cognitive Status: Within Functional Limits for tasks assessed                      General  Comments      Exercises     Assessment/Plan    PT Assessment Patient needs continued PT services  PT Problem List Decreased strength;Decreased mobility;Decreased activity tolerance;Cardiopulmonary status limiting activity;Decreased knowledge of use of DME          PT Treatment Interventions Gait training;Stair training;Functional mobility training;Patient/family education;Therapeutic exercise;Therapeutic activities;DME instruction;Balance training    PT Goals (Current goals can be found in the Care Plan section)  Acute Rehab PT Goals Patient Stated Goal: return home PT Goal  Formulation: With patient Time For Goal Achievement: 09/08/16 Potential to Achieve Goals: Fair    Frequency Min 3X/week   Barriers to discharge Decreased caregiver support daughter just started working 3 days a week for 4 hours at a time, pt is alone during that time    Co-evaluation               End of Session Equipment Utilized During Treatment: Oxygen Activity Tolerance: Patient tolerated treatment well Patient left: in bed;with call bell/phone within Dominguez;with family/visitor present Nurse Communication: Mobility status    Functional Assessment Tool Used: clinical judgement Functional Limitation: Mobility: Walking and moving around Mobility: Walking and Moving Around Current Status [phone]): At least 40 percent but less than 60 percent impaired, limited or restricted Mobility: Walking and Moving Around Goal Status [phone]): At least 1 percent but less than 20 percent impaired, limited or restricted    Time: 0901-0925 PT Time Calculation (min) (ACUTE ONLY): 24 min   Charges:   PT Evaluation $PT Eval Moderate Complexity: 1 Procedure PT Treatments $Therapeutic Activity: 8-22 mins   PT G Codes:   PT G-Codes **NOT FOR INPATIENT CLASS** Functional Assessment Tool Used: clinical judgement Functional Limitation: Mobility: Walking and moving around Mobility: Walking and Moving Around Current Status [id number]): At least 40 percent but less than 60 percent impaired, limited or restricted Mobility: Walking and Moving Around Goal Status [phone]): At least 1 percent but less than 20 percent impaired, limited or restricted    [name] 08/25/2016, 9:49 AM [name], [place]

## 2016-08-25 NOTE — Progress Notes (Signed)
Inpatient Diabetes Program Recommendations  AACE/ADA: New Consensus Statement on Inpatient Glycemic Control (2015)  Target Ranges:  Prepandial:   less than 140 mg/dL      Peak postprandial:   less than 180 mg/dL (1-2 hours)      Critically ill patients:  140 - 180 mg/dL   Review of Glycemic Control Results for [name], [name] (MRN [id number]) as of 08/25/2016 13:57  Ref. Range 09/06/2015 11:17 08/24/2016 11:55 08/24/2016 21:07 08/25/2016 06:21 08/25/2016 11:12  Glucose-Capillary Latest Ref Range: 65 - 99 mg/dL 101 (H) 389 (H) 225 (H) 218 (H) 208 (H)    Inpatient Diabetes Program Recommendations:  Please consider A1c to determine prehospital glycemic control. Noted A1c 7.1 on 08/31/15.  Thank you, Nani Gasser. Zaydin Billey, RN, MSN, CDE Inpatient Glycemic Control Team Team Pager [phone] (8am-5pm) 08/25/2016 1:59 PM

## 2016-08-25 NOTE — Progress Notes (Signed)
PROGRESS NOTE    [name]  [id number] DOB: 11-24-1926 DOA: 08/24/2016 PCP: Maximino Greenland, MD   Brief Narrative: [name] is a 82 y.o. female with medical history significant for atrial fibrillation, history of chronic diastolic congestive heart failure in setting of mitral valve regurgitation due to secondary pulmonary hypertension, dyslipidemia, severe obesity, history of prior syncope, chronic kidney disease stage IV. Patient reports that today after getting up from a seated position to walk across the room patient had a syncopal episode. Patient does not recall actually going down that she remembers waking up on the floor. She does remember having a coughing spell right before the syncopal episode. She was not incontinent of bowel or bladder and according to family she was immediately back to her normal mentation without any signs that would be concerning for postictal phase. She was not having any pain prior to the onset of the episode she's not started any new medications she has not missed any medications recently. She does report recent issues with coughing different from her chronic cough for 2 days with yellow productive sputum but no fevers or chills to poor oral intake.   CT cervical spine out contrast: No acute abnormalities CT head without contrast: No skull fracture and cranial hemorrhage DG thoracic spine with swimmer's view: Osseous demineralization with scattered degenerative changes in the new mild superior endplate compression fracture of T6  DG right shoulder: No acute osseous abnormalities DG sacrum and coccyx: No definite acute osseous abnormalities PCXR: No acute abnormalities Lab data: Sodium 134, potassium 3.8, chloride 80, CO2 41, BUN 51, creatinine 2.17, glucose 374, anion gap 13, BNP 245, poc troponin 0.01, WBC 8800 differential not obtained, hemoglobin 10, platelets 205,000, PT 23, INR 2.01, urinalysis unremarkable Medications and treatments:  Normal saline bolus 500 mL 1, Tylenol 650 mg by mouth 1    Assessment & Plan:   Principal Problem:   Syncope Active Problems:   HYPERCHOLESTEROLEMIA   Hypertensive cardiovascular disease   Chronic atrial fibrillation- CHADs VASc =6   Mitral valve regurgitation with secondary PH   Pulmonary hypertension, moderate to severe (HCC)   Severe obesity (BMI >= 40) (HCC)   Chronic diastolic congestive heart failure (HCC)   CKD (chronic kidney disease) stage 4, GFR 15-29 ml/min (HCC)   Acute kidney injury (HCC)   Thoracic compression fracture (HCC)   Diabetes mellitus type 2, uncontrolled (HCC)   Acute kidney injury superimposed on chronic kidney disease ([place])   Uncontrolled type 2 diabetes mellitus with complication (HCC)    Syncope and collapse -Patient presents with syncope that developed after patient went from sitting to standing position with reports of coughing episode prior to syncopal event; patient also has lab data concerning for volume depletion and acute kidney injury although was not orthostatic when checked in the ER (after given fluid challenge) -Suspect syncope mediated by dehydration -Echocardiogram    Acute kidney injury on CKD (chronic kidney disease) stage 4, GFR 15-29 ml/min  -Baseline renal function has been 27/1.44 on average -At the end of August BUN had increased to 44 with a creatinine of 1.61 -Continue to hold diuretics.  -Hold Demadex and Zaroxolyn Recent Labs       Lab Results  Component Value Date   CREATININE 2.17 (H) 08/24/2016   CREATININE 1.61 (H) 08/04/2016   CREATININE 1.32 (H) 07/23/2016        Acute T6 compression fracture -Patient is not reporting back pain -I have ordered Tylenol for mild pain, Ultram  for moderate pain and low-dose oxycodone for severe pain    Diabetes mellitus 2, uncontrolled with complication -Follow CBGs and provide SS I -Check hemoglobin A1c    Chronic atrial fibrillation- CHADs VASc =6 -Mildly  tachycardic and likely related to dehydration    Mitral valve regurgitation with secondary PH/Chronic diastolic congestive heart failure /Pulmonary hypertension, moderate to severe  -Currently compensated -Diuretics on hold for now    HYPERCHOLESTEROLEMIA -Continue preadmission Lipitor    Severe obesity (BMI >= 40)  -diet education.      DVT prophylaxis: on coumadin  Code Status: Full code.  Family Communication: care discussed with granddaughter  Disposition Plan: home in 24 to 48 hours.    Consultants:   none   Procedures: none   Antimicrobials:    Subjective: Feeling ok, denies dizziness.   Objective: Vitals:   08/24/16 1802 08/24/16 2019 08/25/16 0029 08/25/16 0510  BP:  112/69 (!) 103/55 105/89  Pulse:  89 95 90  Resp:  18 18 18   Temp:  97.9 F (36.6 C) 97.7 F (36.5 C) 97.4 F (36.3 C)  TempSrc:  Axillary Oral Oral  SpO2:  100% 100% 97%  Weight:    104.6 kg (230 lb 11.2 oz)  Height: 5\' 4"  (1.626 m)       Intake/Output Summary (Last 24 hours) at 08/25/16 1113 Last data filed at 08/25/16 0530  Gross per 24 hour  Intake              860 ml  Output             1400 ml  Net             -540 ml   Filed Weights   08/24/16 1759 08/25/16 0510  Weight: 104.4 kg (230 lb 3 oz) 104.6 kg (230 lb 11.2 oz)    Examination:  General exam: Appears calm and comfortable  Respiratory system: Clear to auscultation. Respiratory effort normal. Cardiovascular system: S1 & S2 heard, RRR. No JVD, murmurs, rubs, gallops or clicks. No pedal edema. Gastrointestinal system: Abdomen is nondistended, soft and nontender. No organomegaly or masses felt. Normal bowel sounds heard. Central nervous system: Alert and oriented. No focal neurological deficits. Extremities: Symmetric 5 x 5 power. Skin: No rashes, lesions or ulcers Psychiatry: Judgement and insight appear normal. Mood & affect appropriate.     Data Reviewed: I have personally reviewed following labs and  imaging studies  CBC:  Recent Labs Lab 08/24/16 1155 08/25/16 0238  WBC 8.8 8.8  HGB 10.0* 10.4*  HCT 33.5* 34.9*  MCV 98.5 98.9  PLT 205 [id number]   Basic Metabolic Panel:  Recent Labs Lab 08/24/16 1155 08/25/16 0238  NA 134* 136  K 3.8 3.4*  CL 80* 79*  CO2 41* 45*  GLUCOSE 374* 270*  BUN 51* 46*  CREATININE 2.17* 1.76*  CALCIUM 9.1 9.1   GFR: Estimated Creatinine Clearance: 26.6 mL/min (by C-G formula based on SCr of 1.76 mg/dL (H)). Liver Function Tests: No results for input(s): AST, ALT, ALKPHOS, BILITOT, PROT, ALBUMIN in the last 168 hours. No results for input(s): LIPASE, AMYLASE in the last 168 hours. No results for input(s): AMMONIA in the last 168 hours. Coagulation Profile:  Recent Labs Lab 08/24/16 1307 08/25/16 0238  INR 2.01 2.06   Cardiac Enzymes: No results for input(s): CKTOTAL, CKMB, CKMBINDEX, TROPONINI in the last 168 hours. BNP (last 3 results) No results for input(s): PROBNP in the last 8760 hours. HbA1C: No results for input(s): HGBA1C  in the last 72 hours. CBG:  Recent Labs Lab 08/24/16 1155 08/24/16 2107 08/25/16 0621  GLUCAP 389* 225* 218*   Lipid Profile: No results for input(s): CHOL, HDL, LDLCALC, TRIG, CHOLHDL, LDLDIRECT in the last 72 hours. Thyroid Function Tests: No results for input(s): TSH, T4TOTAL, FREET4, T3FREE, THYROIDAB in the last 72 hours. Anemia Panel: No results for input(s): VITAMINB12, FOLATE, FERRITIN, TIBC, IRON, RETICCTPCT in the last 72 hours. Sepsis Labs: No results for input(s): PROCALCITON, LATICACIDVEN in the last 168 hours.  No results found for this or any previous visit (from the past 240 hour(s)).       Radiology Studies: Dg Chest 1 View  Result Date: 08/24/2016 CLINICAL DATA:  [place] this morning in bedroom at home, RIGHT shoulder pain, low back pain, question loss of consciousness, history hypertension, CHF, diabetes mellitus, initial encounter EXAM: CHEST 1 VIEW COMPARISON:  09/01/2015  FINDINGS: Enlargement of cardiac silhouette with minimal pulmonary vascular congestion. Atherosclerotic calcification aorta. Mild elevation of RIGHT diaphragm unchanged. Slight chronic accentuation of mid lung pulmonary markings. No definite acute infiltrate, pleural effusion or pneumothorax. Bones diffusely demineralized without definite fracture. IMPRESSION: Enlargement of cardiac silhouette with pulmonary vascular congestion. No acute abnormalities. Aortic atherosclerosis. Electronically Signed   By: Lavonia Dana M.D.   On: 08/24/2016 16:14   Dg Thoracic Spine W/swimmers  Result Date: 08/24/2016 CLINICAL DATA:  [place] this morning in bedroom at home, RIGHT shoulder pain, low back pain, question loss of consciousness, history hypertension, CHF, diabetes mellitus, initial encounter EXAM: THORACIC SPINE - 3 VIEWS COMPARISON:  Chest radiographs 09/01/2015, 08/31/2015, 06/08/2015 FINDINGS: Osseous demineralization. Twelve pairs of ribs. Multilevel disc space narrowing and endplate spur formation. Superior endplate compression fracture of T6 vertebral body with mild anterior height loss, new since prior exams. No additional fracture or subluxation. Degenerative disc disease changes of the cervical spine as well. IMPRESSION: Osseous demineralization with scattered degenerative changes and a new mild superior endplate compression fracture of T6. Electronically Signed   By: Lavonia Dana M.D.   On: 08/24/2016 16:18   Dg Sacrum/coccyx  Result Date: 08/24/2016 CLINICAL DATA:  [place] this morning in bedroom at home, RIGHT shoulder pain, low back pain, question loss of consciousness, history hypertension, CHF, diabetes mellitus, initial encounter EXAM: SACRUM AND COCCYX - 2+ VIEW COMPARISON:  CT abdomen pelvis 09/02/2012 FINDINGS: Osseous demineralization. SI joints preserved. Mild narrowing of hip joints bilaterally. Sacral foramina symmetric. No definite sacrococcygeal fracture identified though assessment of the distal  sacrum is suboptimal. IMPRESSION: No definite acute osseous abnormalities. Electronically Signed   By: Lavonia Dana M.D.   On: 08/24/2016 16:21   Dg Shoulder Right  Result Date: 08/24/2016 CLINICAL DATA:  [place] this morning in bedroom at home, RIGHT shoulder pain, low back pain, question loss of consciousness, history hypertension, CHF, diabetes mellitus, initial encounter EXAM: RIGHT SHOULDER - 2+ VIEW COMPARISON:  None FINDINGS: Osseous demineralization. AC joint alignment normal. No acute fracture, dislocation, or bone destruction. Visualized RIGHT ribs intact. Atherosclerotic calcifications of the RIGHT carotid system incidentally noted. IMPRESSION: No acute osseous abnormalities. RIGHT carotid atherosclerotic calcifications. Electronically Signed   By: Lavonia Dana M.D.   On: 08/24/2016 16:20   Ct Head Wo Contrast  Result Date: 08/24/2016 CLINICAL DATA:  Patient fell. Amnestic for event. Patient reports neck pain. EXAM: CT HEAD WITHOUT CONTRAST CT CERVICAL SPINE WITHOUT CONTRAST TECHNIQUE: Multidetector CT imaging of the head and cervical spine was performed following the standard protocol without intravenous contrast. Multiplanar CT image reconstructions of the cervical  spine were also generated. COMPARISON:  05/19/2015. FINDINGS: CT HEAD FINDINGS Brain: No evidence of acute infarction, hemorrhage, hydrocephalus, extra-axial collection or mass lesion/mass effect. Generalized atrophy. Chronic microvascular ischemic change. Vascular: No hyperdense vessel.  Advanced vascular calcification. Skull: Normal. Negative for fracture or focal lesion. Sinuses/Orbits: No acute finding. Other: None. CT CERVICAL SPINE FINDINGS Alignment: Reversal of the normal cervical lordotic curve. Degenerative anterolisthesis C2-3. Degenerative retrolisthesis C3-4, C4-5, and C5-6. Vision anterolisthesis C7-T1. Skull base and vertebrae: No acute fracture. No primary bone lesion or focal pathologic process. Soft tissues and spinal  canal: No intraspinal hematoma is evident. There is advanced atherosclerosis. Disc levels: All disc spaces demonstrate narrowing. Calcific protrusions at C3-4 and C4-5 potentially contribute to stenosis. Multilevel facet arthropathy appears worst at C2-3 on the LEFT, and C7-T1 bilaterally. Upper chest: No lung apex lesion. Prominence of the transverse arch, incompletely evaluated, suggesting aneurysmal dilatation. Other: Unremarkable. Compared with 2016, similar appearance. IMPRESSION: No skull fracture or intracranial hemorrhage. Atrophy and small vessel disease. No cervical spine fracture or traumatic subluxation. Advanced spondylosis. Dilated transverse arch of the aorta. Consider further evaluation with CTA chest on elective basis. Electronically Signed   By: Staci Righter M.D.   On: 08/24/2016 16:01   Ct Cervical Spine Wo Contrast  Result Date: 08/24/2016 CLINICAL DATA:  Patient fell. Amnestic for event. Patient reports neck pain. EXAM: CT HEAD WITHOUT CONTRAST CT CERVICAL SPINE WITHOUT CONTRAST TECHNIQUE: Multidetector CT imaging of the head and cervical spine was performed following the standard protocol without intravenous contrast. Multiplanar CT image reconstructions of the cervical spine were also generated. COMPARISON:  05/19/2015. FINDINGS: CT HEAD FINDINGS Brain: No evidence of acute infarction, hemorrhage, hydrocephalus, extra-axial collection or mass lesion/mass effect. Generalized atrophy. Chronic microvascular ischemic change. Vascular: No hyperdense vessel.  Advanced vascular calcification. Skull: Normal. Negative for fracture or focal lesion. Sinuses/Orbits: No acute finding. Other: None. CT CERVICAL SPINE FINDINGS Alignment: Reversal of the normal cervical lordotic curve. Degenerative anterolisthesis C2-3. Degenerative retrolisthesis C3-4, C4-5, and C5-6. Vision anterolisthesis C7-T1. Skull base and vertebrae: No acute fracture. No primary bone lesion or focal pathologic process. Soft  tissues and spinal canal: No intraspinal hematoma is evident. There is advanced atherosclerosis. Disc levels: All disc spaces demonstrate narrowing. Calcific protrusions at C3-4 and C4-5 potentially contribute to stenosis. Multilevel facet arthropathy appears worst at C2-3 on the LEFT, and C7-T1 bilaterally. Upper chest: No lung apex lesion. Prominence of the transverse arch, incompletely evaluated, suggesting aneurysmal dilatation. Other: Unremarkable. Compared with 2016, similar appearance. IMPRESSION: No skull fracture or intracranial hemorrhage. Atrophy and small vessel disease. No cervical spine fracture or traumatic subluxation. Advanced spondylosis. Dilated transverse arch of the aorta. Consider further evaluation with CTA chest on elective basis. Electronically Signed   By: Staci Righter M.D.   On: 08/24/2016 16:01        Scheduled Meds: . atorvastatin  20 mg Oral Daily  . gabapentin  300 mg Oral QHS  . insulin aspart  0-15 Units Subcutaneous TID WC  . insulin aspart  0-5 Units Subcutaneous QHS  . metoprolol tartrate  25 mg Oral BID  . pantoprazole  40 mg Oral BID AC  . sodium chloride flush  3 mL Intravenous Q12H  . Warfarin - Pharmacist Dosing Inpatient   Does not apply q1800   Continuous Infusions:    LOS: 0 days    Time spent: 35 minutes.     Elmarie Shiley, MD [facility] Pager [phone]  If 7PM-7AM, please contact night-coverage [email or web address] Password  [email or web address] 08/25/2016, 11:13 AM

## 2016-08-25 NOTE — Progress Notes (Signed)
ANTICOAGULATION CONSULT NOTE - Initial Consult  Pharmacy Consult for Coumadin Indication: atrial fibrillation  Allergies  Allergen Reactions  . Peach [Prunus Persica] Rash    AGENT: peach fuzz    Patient Measurements: Height: 5\' 4"  (162.6 cm) Weight: 230 lb 11.2 oz (104.6 kg) IBW/kg (Calculated) : 54.7 Heparin Dosing Weight: n/a  Vital Signs: Temp: 97.4 F (36.3 C) (09/18 0510) Temp Source: Oral (09/18 0510) BP: 105/89 (09/18 0510) Pulse Rate: 90 (09/18 0510)  Labs:  Recent Labs  08/24/16 1155 08/24/16 1307 08/25/16 0238  HGB 10.0*  --  10.4*  HCT 33.5*  --  34.9*  PLT 205  --  197  LABPROT  --  23.1* 23.5*  INR  --  2.01 2.06  CREATININE 2.17*  --  1.76*    Estimated Creatinine Clearance: 26.6 mL/min (by C-G formula based on SCr of 1.76 mg/dL (H)).   Medical History: Past Medical History:  Diagnosis Date  . Anemia   . Arthritis   . Breast cancer ([place]) 2012   right  . CHF (congestive heart failure) ([place])   . Cholelithiasis   . DCIS (ductal carcinoma in situ) of breast 07/15/2010   DCIS s/p lumpectomy and radiation 2011  . Diabetes mellitus   . DVT (deep venous thrombosis) ([place])   . GERD (gastroesophageal reflux disease)   . GI bleed   . Heart murmur   . Hyperlipidemia   . Hypertension   . Peripheral neuropathy ([place])   . Shortness of breath dyspnea   . Syncope 05/09/2015    Assessment: 82 yo female on chronic Coumadin for afib. Admitted with fall. INR therapeutic today, stable at 2.06. PTA Coumadin dose 2mg  daily except 4 mg on Thursdays (last dose PTA on 9/17). Hg low stable, plt wnl. No bleed documented.  Goal of Therapy:  INR 2-3 Monitor platelets by anticoagulation protocol: Yes   Plan:  Warfarin 2mg  x 1 dose tonight Daily PT/INR Monitor for s/sx bleeding  Elicia Lamp, PharmD, BCPS Clinical Pharmacist Pager [phone] 08/25/2016 11:13 AM

## 2016-08-26 ENCOUNTER — Observation Stay (HOSPITAL_COMMUNITY): Payer: Commercial Managed Care - HMO

## 2016-08-26 DIAGNOSIS — R55 Syncope and collapse: Secondary | ICD-10-CM | POA: Diagnosis not present

## 2016-08-26 DIAGNOSIS — L8990 Pressure ulcer of unspecified site, unspecified stage: Secondary | ICD-10-CM | POA: Insufficient documentation

## 2016-08-26 LAB — GLUCOSE, CAPILLARY
GLUCOSE-CAPILLARY: 220 mg/dL — AB (ref 65–99)
GLUCOSE-CAPILLARY: 210 mg/dL — AB (ref 65–99)
Glucose-Capillary: 198 mg/dL — ABNORMAL HIGH (ref 65–99)
Glucose-Capillary: 136 mg/dL — ABNORMAL HIGH (ref 65–99)
Glucose-Capillary: 157 mg/dL — ABNORMAL HIGH (ref 65–99)

## 2016-08-26 LAB — BASIC METABOLIC PANEL
Anion gap: 12 (ref 5–15)
BUN: 43 mg/dL — AB (ref 6–20)
CHLORIDE: 78 mmol/L — AB (ref 101–111)
CO2: 43 mmol/L — ABNORMAL HIGH (ref 22–32)
CREATININE: 1.6 mg/dL — AB (ref 0.44–1.00)
Calcium: 8.7 mg/dL — ABNORMAL LOW (ref 8.9–10.3)
GFR calc Af Amer: 32 mL/min — ABNORMAL LOW (ref >60)
GFR calc non Af Amer: 28 mL/min — ABNORMAL LOW (ref >60)
Glucose, Bld: 200 mg/dL — ABNORMAL HIGH (ref 65–99)
Potassium: 3.7 mmol/L (ref 3.5–5.1)
SODIUM: 133 mmol/L — AB (ref 135–145)

## 2016-08-26 LAB — PROTIME-INR
INR: 2.02
PROTHROMBIN TIME: 23.2 s — AB (ref 11.4–15.2)

## 2016-08-26 MED ORDER — METOPROLOL TARTRATE 25 MG PO TABS: 12.5000 mg | ORAL_TABLET | Freq: Two times a day (BID) | ORAL | 0 refills | Status: DC

## 2016-08-26 MED ORDER — WARFARIN SODIUM 4 MG PO TABS
4.0000 mg | ORAL_TABLET | Freq: Once | ORAL | Status: AC
  Administered 2016-08-26: 4 mg via ORAL
  Filled 2016-08-26: qty 1

## 2016-08-26 MED ORDER — POTASSIUM CHLORIDE CRYS ER 20 MEQ PO TBCR: 20.0000 meq | EXTENDED_RELEASE_TABLET | Freq: Every day | ORAL | 6 refills | Status: DC

## 2016-08-26 MED ORDER — INFLUENZA VAC SPLIT QUAD 0.5 ML IM SUSY
0.5000 mL | PREFILLED_SYRINGE | INTRAMUSCULAR | Status: AC
  Administered 2016-08-27: 0.5 mL via INTRAMUSCULAR
  Filled 2016-08-26: qty 0.5

## 2016-08-26 MED ORDER — METOLAZONE 2.5 MG PO TABS: ORAL_TABLET | ORAL | 0 refills | Status: DC

## 2016-08-26 MED ORDER — TORSEMIDE 20 MG PO TABS: 40.0000 mg | ORAL_TABLET | Freq: Every day | ORAL | 0 refills | Status: DC

## 2016-08-26 NOTE — Progress Notes (Signed)
EEG Completed; Results Pending  

## 2016-08-26 NOTE — Progress Notes (Signed)
PROGRESS NOTE    [name]  [id number] DOB: 1926-04-13 DOA: 08/24/2016 PCP: Maximino Greenland, MD   Brief Narrative: [name] is a 82 y.o. female with medical history significant for atrial fibrillation, history of chronic diastolic congestive heart failure in setting of mitral valve regurgitation due to secondary pulmonary hypertension, dyslipidemia, severe obesity, history of prior syncope, chronic kidney disease stage IV. Patient reports that today after getting up from a seated position to walk across the room patient had a syncopal episode. Patient does not recall actually going down that she remembers waking up on the floor. She does remember having a coughing spell right before the syncopal episode. She was not incontinent of bowel or bladder and according to family she was immediately back to her normal mentation without any signs that would be concerning for postictal phase. She was not having any pain prior to the onset of the episode she's not started any new medications she has not missed any medications recently. She does report recent issues with coughing different from her chronic cough for 2 days with yellow productive sputum but no fevers or chills to poor oral intake.   CT cervical spine out contrast: No acute abnormalities CT head without contrast: No skull fracture and cranial hemorrhage DG thoracic spine with swimmer's view: Osseous demineralization with scattered degenerative changes in the new mild superior endplate compression fracture of T6  DG right shoulder: No acute osseous abnormalities DG sacrum and coccyx: No definite acute osseous abnormalities PCXR: No acute abnormalities Lab data: Sodium 134, potassium 3.8, chloride 80, CO2 41, BUN 51, creatinine 2.17, glucose 374, anion gap 13, BNP 245, poc troponin 0.01, WBC 8800 differential not obtained, hemoglobin 10, platelets 205,000, PT 23, INR 2.01, urinalysis unremarkable Medications and treatments:  Normal saline bolus 500 mL 1, Tylenol 650 mg by mouth 1    Assessment & Plan:   Principal Problem:   Syncope Active Problems:   HYPERCHOLESTEROLEMIA   Hypertensive cardiovascular disease   Chronic atrial fibrillation- CHADs VASc =6   Mitral valve regurgitation with secondary PH   Pulmonary hypertension, moderate to severe (HCC)   Severe obesity (BMI >= 40) (HCC)   Chronic diastolic congestive heart failure (HCC)   CKD (chronic kidney disease) stage 4, GFR 15-29 ml/min (HCC)   Acute kidney injury (HCC)   Thoracic compression fracture (HCC)   Diabetes mellitus type 2, uncontrolled (HCC)   Acute kidney injury superimposed on chronic kidney disease ([place])   Uncontrolled type 2 diabetes mellitus with complication (HCC)   Pressure injury of skin    Syncope and collapse -Patient presents with syncope that developed after patient went from sitting to standing position with reports of coughing episode prior to syncopal event; patient also has lab data concerning for volume depletion and acute kidney injury although was not orthostatic when checked in the ER (after given fluid challenge) -Suspect syncope mediated by dehydration -Echocardiogram normal EF.  -per grand-daughter patient had another seizure " episode she become weak, eyes rolls back, mild tremors". Today when she was transition from bed to chair. Patient has had this episodes for months, her doctors are aware.  -I think episode might be related to hypotension, hypoperfusion. Continue to hold diuretics. Will check EGG. Will need to be carefull with fluids due to HF.     Acute kidney injury on CKD (chronic kidney disease) stage 4, GFR 15-29 ml/min  -Baseline renal function has been 27/1.44 on average -At the end of August BUN had increased  to 44 with a creatinine of 1.61 -Continue to hold diuretics.  -Hold Demadex and Zaroxolyn Recent Labs       Lab Results  Component Value Date   CREATININE 2.17 (H) 08/24/2016    CREATININE 1.61 (H) 08/04/2016   CREATININE 1.32 (H) 07/23/2016     cr trending down. Continue to hold diuretics.     Acute T6 compression fracture -Patient is not reporting back pain -I have ordered Tylenol for mild pain, Ultram for moderate pain and low-dose oxycodone for severe pain    Diabetes mellitus 2, uncontrolled with complication -Follow CBGs and provide SS I    Chronic atrial fibrillation- CHADs VASc =6 -Mildly tachycardic and likely related to dehydration -continue with lower dose metoprolol.     Mitral valve regurgitation with secondary PH/Chronic diastolic congestive heart failure /Pulmonary hypertension, moderate to severe  -Currently compensated -Diuretics on hold for now    HYPERCHOLESTEROLEMIA -Continue preadmission Lipitor    Severe obesity (BMI >= 40)  -diet education.      DVT prophylaxis: on coumadin  Code Status: Full code.  Family Communication: care discussed with granddaughter  Disposition Plan: SNF in 60 to 48 hours.    Consultants:   none   Procedures: none   Antimicrobials:    Subjective: Had an episode where she became lightheaded, eyes roll back, mild tremors.   Objective: Vitals:   08/25/16 1303 08/25/16 1942 08/26/16 0548 08/26/16 1037  BP: (!) 105/58 116/67 119/61 (!) 124/58  Pulse: 95 98 81 97  Resp: 17 17 17 15   Temp: 97.6 F (36.4 C) 98 F (36.7 C) 98.1 F (36.7 C) 98.2 F (36.8 C)  TempSrc: Oral Oral Oral Oral  SpO2: 96% 96% 98% 100%  Weight:   106.1 kg (234 lb)   Height:        Intake/Output Summary (Last 24 hours) at 08/26/16 1503 Last data filed at 08/26/16 0900  Gross per 24 hour  Intake              360 ml  Output              800 ml  Net             -440 ml   Filed Weights   08/24/16 1759 08/25/16 0510 08/26/16 0548  Weight: 104.4 kg (230 lb 3 oz) 104.6 kg (230 lb 11.2 oz) 106.1 kg (234 lb)    Examination:  General exam: Appears calm and comfortable  Respiratory system: Clear to  auscultation. Respiratory effort normal. Cardiovascular system: S1 & S2 heard, RRR. No JVD, murmurs, rubs, gallops or clicks. No pedal edema. Gastrointestinal system: Abdomen is nondistended, soft and nontender. No organomegaly or masses felt. Normal bowel sounds heard. Central nervous system: Alert and oriented. No focal neurological deficits. Extremities: Symmetric 5 x 5 power. Skin: No rashes, lesions or ulcers Psychiatry: Judgement and insight appear normal. Mood & affect appropriate.     Data Reviewed: I have personally reviewed following labs and imaging studies  CBC:  Recent Labs Lab 08/24/16 1155 08/25/16 0238  WBC 8.8 8.8  HGB 10.0* 10.4*  HCT 33.5* 34.9*  MCV 98.5 98.9  PLT 205 [id number]   Basic Metabolic Panel:  Recent Labs Lab 08/24/16 1155 08/25/16 0238 08/26/16 0927  NA 134* 136 133*  K 3.8 3.4* 3.7  CL 80* 79* 78*  CO2 41* 45* 43*  GLUCOSE 374* 270* 200*  BUN 51* 46* 43*  CREATININE 2.17* 1.76* 1.60*  CALCIUM 9.1 9.1 8.7*  GFR: Estimated Creatinine Clearance: 29.4 mL/min (by C-G formula based on SCr of 1.6 mg/dL (H)). Liver Function Tests: No results for input(s): AST, ALT, ALKPHOS, BILITOT, PROT, ALBUMIN in the last 168 hours. No results for input(s): LIPASE, AMYLASE in the last 168 hours. No results for input(s): AMMONIA in the last 168 hours. Coagulation Profile:  Recent Labs Lab 08/24/16 1307 08/25/16 0238 08/26/16 0219  INR 2.01 2.06 2.02   Cardiac Enzymes: No results for input(s): CKTOTAL, CKMB, CKMBINDEX, TROPONINI in the last 168 hours. BNP (last 3 results) No results for input(s): PROBNP in the last 8760 hours. HbA1C: No results for input(s): HGBA1C in the last 72 hours. CBG:  Recent Labs Lab 08/25/16 0621 08/25/16 1112 08/25/16 1824 08/25/16 2103 08/26/16 0643  GLUCAP 218* 208* 181* 185* 220*   Lipid Profile: No results for input(s): CHOL, HDL, LDLCALC, TRIG, CHOLHDL, LDLDIRECT in the last 72 hours. Thyroid Function  Tests: No results for input(s): TSH, T4TOTAL, FREET4, T3FREE, THYROIDAB in the last 72 hours. Anemia Panel: No results for input(s): VITAMINB12, FOLATE, FERRITIN, TIBC, IRON, RETICCTPCT in the last 72 hours. Sepsis Labs: No results for input(s): PROCALCITON, LATICACIDVEN in the last 168 hours.  No results found for this or any previous visit (from the past 240 hour(s)).       Radiology Studies: Dg Chest 1 View  Result Date: 08/24/2016 CLINICAL DATA:  [place] this morning in bedroom at home, RIGHT shoulder pain, low back pain, question loss of consciousness, history hypertension, CHF, diabetes mellitus, initial encounter EXAM: CHEST 1 VIEW COMPARISON:  09/01/2015 FINDINGS: Enlargement of cardiac silhouette with minimal pulmonary vascular congestion. Atherosclerotic calcification aorta. Mild elevation of RIGHT diaphragm unchanged. Slight chronic accentuation of mid lung pulmonary markings. No definite acute infiltrate, pleural effusion or pneumothorax. Bones diffusely demineralized without definite fracture. IMPRESSION: Enlargement of cardiac silhouette with pulmonary vascular congestion. No acute abnormalities. Aortic atherosclerosis. Electronically Signed   By: Lavonia Dana M.D.   On: 08/24/2016 16:14   Dg Thoracic Spine W/swimmers  Result Date: 08/24/2016 CLINICAL DATA:  [place] this morning in bedroom at home, RIGHT shoulder pain, low back pain, question loss of consciousness, history hypertension, CHF, diabetes mellitus, initial encounter EXAM: THORACIC SPINE - 3 VIEWS COMPARISON:  Chest radiographs 09/01/2015, 08/31/2015, 06/08/2015 FINDINGS: Osseous demineralization. Twelve pairs of ribs. Multilevel disc space narrowing and endplate spur formation. Superior endplate compression fracture of T6 vertebral body with mild anterior height loss, new since prior exams. No additional fracture or subluxation. Degenerative disc disease changes of the cervical spine as well. IMPRESSION: Osseous  demineralization with scattered degenerative changes and a new mild superior endplate compression fracture of T6. Electronically Signed   By: Lavonia Dana M.D.   On: 08/24/2016 16:18   Dg Sacrum/coccyx  Result Date: 08/24/2016 CLINICAL DATA:  [place] this morning in bedroom at home, RIGHT shoulder pain, low back pain, question loss of consciousness, history hypertension, CHF, diabetes mellitus, initial encounter EXAM: SACRUM AND COCCYX - 2+ VIEW COMPARISON:  CT abdomen pelvis 09/02/2012 FINDINGS: Osseous demineralization. SI joints preserved. Mild narrowing of hip joints bilaterally. Sacral foramina symmetric. No definite sacrococcygeal fracture identified though assessment of the distal sacrum is suboptimal. IMPRESSION: No definite acute osseous abnormalities. Electronically Signed   By: Lavonia Dana M.D.   On: 08/24/2016 16:21   Dg Shoulder Right  Result Date: 08/24/2016 CLINICAL DATA:  [place] this morning in bedroom at home, RIGHT shoulder pain, low back pain, question loss of consciousness, history hypertension, CHF, diabetes mellitus, initial encounter EXAM: RIGHT  SHOULDER - 2+ VIEW COMPARISON:  None FINDINGS: Osseous demineralization. AC joint alignment normal. No acute fracture, dislocation, or bone destruction. Visualized RIGHT ribs intact. Atherosclerotic calcifications of the RIGHT carotid system incidentally noted. IMPRESSION: No acute osseous abnormalities. RIGHT carotid atherosclerotic calcifications. Electronically Signed   By: Lavonia Dana M.D.   On: 08/24/2016 16:20   Ct Head Wo Contrast  Result Date: 08/24/2016 CLINICAL DATA:  Patient fell. Amnestic for event. Patient reports neck pain. EXAM: CT HEAD WITHOUT CONTRAST CT CERVICAL SPINE WITHOUT CONTRAST TECHNIQUE: Multidetector CT imaging of the head and cervical spine was performed following the standard protocol without intravenous contrast. Multiplanar CT image reconstructions of the cervical spine were also generated. COMPARISON:  05/19/2015.  FINDINGS: CT HEAD FINDINGS Brain: No evidence of acute infarction, hemorrhage, hydrocephalus, extra-axial collection or mass lesion/mass effect. Generalized atrophy. Chronic microvascular ischemic change. Vascular: No hyperdense vessel.  Advanced vascular calcification. Skull: Normal. Negative for fracture or focal lesion. Sinuses/Orbits: No acute finding. Other: None. CT CERVICAL SPINE FINDINGS Alignment: Reversal of the normal cervical lordotic curve. Degenerative anterolisthesis C2-3. Degenerative retrolisthesis C3-4, C4-5, and C5-6. Vision anterolisthesis C7-T1. Skull base and vertebrae: No acute fracture. No primary bone lesion or focal pathologic process. Soft tissues and spinal canal: No intraspinal hematoma is evident. There is advanced atherosclerosis. Disc levels: All disc spaces demonstrate narrowing. Calcific protrusions at C3-4 and C4-5 potentially contribute to stenosis. Multilevel facet arthropathy appears worst at C2-3 on the LEFT, and C7-T1 bilaterally. Upper chest: No lung apex lesion. Prominence of the transverse arch, incompletely evaluated, suggesting aneurysmal dilatation. Other: Unremarkable. Compared with 2016, similar appearance. IMPRESSION: No skull fracture or intracranial hemorrhage. Atrophy and small vessel disease. No cervical spine fracture or traumatic subluxation. Advanced spondylosis. Dilated transverse arch of the aorta. Consider further evaluation with CTA chest on elective basis. Electronically Signed   By: Staci Righter M.D.   On: 08/24/2016 16:01   Ct Cervical Spine Wo Contrast  Result Date: 08/24/2016 CLINICAL DATA:  Patient fell. Amnestic for event. Patient reports neck pain. EXAM: CT HEAD WITHOUT CONTRAST CT CERVICAL SPINE WITHOUT CONTRAST TECHNIQUE: Multidetector CT imaging of the head and cervical spine was performed following the standard protocol without intravenous contrast. Multiplanar CT image reconstructions of the cervical spine were also generated. COMPARISON:   05/19/2015. FINDINGS: CT HEAD FINDINGS Brain: No evidence of acute infarction, hemorrhage, hydrocephalus, extra-axial collection or mass lesion/mass effect. Generalized atrophy. Chronic microvascular ischemic change. Vascular: No hyperdense vessel.  Advanced vascular calcification. Skull: Normal. Negative for fracture or focal lesion. Sinuses/Orbits: No acute finding. Other: None. CT CERVICAL SPINE FINDINGS Alignment: Reversal of the normal cervical lordotic curve. Degenerative anterolisthesis C2-3. Degenerative retrolisthesis C3-4, C4-5, and C5-6. Vision anterolisthesis C7-T1. Skull base and vertebrae: No acute fracture. No primary bone lesion or focal pathologic process. Soft tissues and spinal canal: No intraspinal hematoma is evident. There is advanced atherosclerosis. Disc levels: All disc spaces demonstrate narrowing. Calcific protrusions at C3-4 and C4-5 potentially contribute to stenosis. Multilevel facet arthropathy appears worst at C2-3 on the LEFT, and C7-T1 bilaterally. Upper chest: No lung apex lesion. Prominence of the transverse arch, incompletely evaluated, suggesting aneurysmal dilatation. Other: Unremarkable. Compared with 2016, similar appearance. IMPRESSION: No skull fracture or intracranial hemorrhage. Atrophy and small vessel disease. No cervical spine fracture or traumatic subluxation. Advanced spondylosis. Dilated transverse arch of the aorta. Consider further evaluation with CTA chest on elective basis. Electronically Signed   By: Staci Righter M.D.   On: 08/24/2016 16:01  Scheduled Meds: . atorvastatin  20 mg Oral Daily  . gabapentin  300 mg Oral QHS  . [START ON 08/27/2016] Influenza vac split quadrivalent PF  0.5 mL Intramuscular Tomorrow-1000  . insulin aspart  0-15 Units Subcutaneous TID WC  . insulin aspart  0-5 Units Subcutaneous QHS  . metoprolol tartrate  12.5 mg Oral BID  . pantoprazole  40 mg Oral BID AC  . sodium chloride flush  3 mL Intravenous Q12H  .  warfarin  4 mg Oral ONCE-1800  . Warfarin - Pharmacist Dosing Inpatient   Does not apply q1800   Continuous Infusions:    LOS: 0 days    Time spent: 35 minutes.     Elmarie Shiley, MD [facility] Pager [phone]  If 7PM-7AM, please contact night-coverage [email or web address] Password [email or web address] 08/26/2016, 3:03 PM

## 2016-08-26 NOTE — Progress Notes (Signed)
[place] for Coumadin Indication: atrial fibrillation  Allergies  Allergen Reactions  . Peach [Prunus Persica] Rash    AGENT: peach fuzz    Patient Measurements: Height: 5\' 4"  (162.6 cm) Weight: 234 lb (106.1 kg) IBW/kg (Calculated) : 54.7 Heparin Dosing Weight: n/a  Vital Signs: Temp: 98.2 F (36.8 C) (09/19 1037) Temp Source: Oral (09/19 1037) BP: 124/58 (09/19 1037) Pulse Rate: 97 (09/19 1037)  Labs:  Recent Labs  08/24/16 1155 08/24/16 1307 08/25/16 0238 08/26/16 0219 08/26/16 0927  HGB 10.0*  --  10.4*  --   --   HCT 33.5*  --  34.9*  --   --   PLT 205  --  197  --   --   LABPROT  --  23.1* 23.5* 23.2*  --   INR  --  2.01 2.06 2.02  --   CREATININE 2.17*  --  1.76*  --  1.60*    Estimated Creatinine Clearance: 29.4 mL/min (by C-G formula based on SCr of 1.6 mg/dL (H)).   Medical History: Past Medical History:  Diagnosis Date  . Anemia   . Arthritis   . Breast cancer ([place]) 2012   right  . CHF (congestive heart failure) ([place])   . Cholelithiasis   . DCIS (ductal carcinoma in situ) of breast 07/15/2010   DCIS s/p lumpectomy and radiation 2011  . Diabetes mellitus   . DVT (deep venous thrombosis) ([place])   . GERD (gastroesophageal reflux disease)   . GI bleed   . Heart murmur   . Hyperlipidemia   . Hypertension   . Peripheral neuropathy ([place])   . Shortness of breath dyspnea   . Syncope 05/09/2015    Assessment: 82 yo female on chronic Coumadin for afib. Admitted with fall, CT head negative. INR therapeutic today, stable at 2.02. PTA Coumadin dose 2mg  daily except 4 mg on Thursdays (last dose PTA on 9/17). Hg low stable, plt wnl. No bleed documented.  Goal of Therapy:  INR 2-3 Monitor platelets by anticoagulation protocol: Yes   Plan:  Warfarin 4mg  x 1 dose tonight Daily PT/INR Monitor for s/sx bleeding  Elicia Lamp, PharmD, BCPS Clinical Pharmacist Pager [phone] 08/26/2016 11:01 AM

## 2016-08-26 NOTE — Evaluation (Signed)
Occupational Therapy Evaluation Patient Details Name: [name] MRN: [id number] DOB: [birth year] Today's Date: 08/26/2016    History of Present Illness 82 yo admitted with syncope. Found to have T6 compression fx. PMHx: Afib, CHF, HTN, DM   Clinical Impression   Pt with decline in function and safety with ADLs and ADL mobility with decreased strength, balance and endurance. Pt's BP remained normal during session with pt requesting a break to sit EOB x 2 minutes after standing at RW x 3 minutes and stated that she felt "out of it for a minute or two" with BP still normal/stable. Pt would benefit from acute OT services to address impairments to increase level of function and safety    Follow Up Recommendations  SNF    Equipment Recommendations  None recommended by OT    Recommendations for Other Services       Precautions / Restrictions Precautions Precautions: Fall Precaution Comments: watch BP Restrictions Weight Bearing Restrictions: No      Mobility Bed Mobility Overal bed mobility: Needs Assistance Bed Mobility: Supine to Sit     Supine to sit: Min assist     General bed mobility comments: used rails, increased time  Transfers Overall transfer level: Needs assistance Equipment used: Rolling walker (2 wheeled) Transfers: Sit to/from Stand Sit to Stand: Min guard         General transfer comment: cues for hand placement and safety    Balance Overall balance assessment: Needs assistance   Sitting balance-Leahy Scale: Good       Standing balance-Leahy Scale: Poor                              ADL Overall ADL's : Needs assistance/impaired     Grooming: Wash/dry hands;Wash/dry face;Minimal assistance;Standing   Upper Body Bathing: Set up;Supervision/ safety   Lower Body Bathing: Maximal assistance   Upper Body Dressing : Set up;Supervision/safety   Lower Body Dressing: Maximal assistance   Toilet Transfer: Min  guard;RW;BSC;Cueing for safety   Toileting- Clothing Manipulation and Hygiene: Moderate assistance;Sit to/from stand       Functional mobility during ADLs: Rolling walker;Min guard       Vision  no change from baseline              Pertinent Vitals/Pain Pain Assessment: 0-10 Pain Score: 4  Pain Location: B knees Pain Descriptors / Indicators: Aching;Sore Pain Intervention(s): Monitored during session;Repositioned     Hand Dominance Right   Extremity/Trunk Assessment Upper Extremity Assessment Upper Extremity Assessment: Generalized weakness   Lower Extremity Assessment Lower Extremity Assessment: Defer to PT evaluation   Cervical / Trunk Assessment Cervical / Trunk Assessment: Kyphotic   Communication Communication Communication: No difficulties   Cognition Arousal/Alertness: Awake/alert Behavior During Therapy: WFL for tasks assessed/performed Overall Cognitive Status: Within Functional Limits for tasks assessed                     General Comments   pt very pleasant and cooperative, family very supportive                 Home Living Family/patient expects to be discharged to:: Private residence Living Arrangements: Children Available Help at Discharge: Family;Available PRN/intermittently Type of Home: House Home Access: Ramped entrance     Home Layout: Two level;Bed/bath upstairs Alternate Level Stairs-Number of Steps: 12   Bathroom Shower/Tub: Tub/shower unit;Curtain   Biochemist, clinical: Standard  Home Equipment: [place] - 2 wheels;Cane - single point;Wheelchair - [employer] - 4 wheels;Shower seat;Adaptive equipment Adaptive Equipment: Sock aid        Prior Functioning/Environment Level of Independence: Needs assistance  Gait / Transfers Assistance Needed: pt states she uses cane inside, stays upstairs in her bedroom and requires 2 person assist for stairs to exit house ADL's / Homemaking Assistance Needed: pt states she has  assist with bathing and dressing            OT Problem List: Decreased strength;Impaired balance (sitting and/or standing);Pain;Decreased activity tolerance;Decreased knowledge of use of DME or AE   OT Treatment/Interventions: Self-care/ADL training;DME and/or AE instruction;Patient/family education;Therapeutic activities    OT Goals(Current goals can be found in the care plan section) Acute Rehab OT Goals Patient Stated Goal: return home OT Goal Formulation: With patient/family Time For Goal Achievement: 09/02/16 Potential to Achieve Goals: Good ADL Goals Pt Will Perform Grooming: with min guard assist;with supervision;standing Pt Will Perform Upper Body Bathing: sitting;with set-up Pt Will Perform Lower Body Bathing: with mod assist;sitting/lateral leans;sit to/from stand Pt Will Perform Upper Body Dressing: with set-up;sitting Pt Will Transfer to Toilet: with supervision;with min guard assist;ambulating;regular height toilet;grab bars Pt Will Perform Toileting - Clothing Manipulation and hygiene: with min assist;sit to/from stand  OT Frequency: Min 2X/week   Barriers to D/C: Decreased caregiver support                        End of Session Equipment Utilized During Treatment: Gait belt;Rolling walker;Other (comment);Oxygen (BSC)  Activity Tolerance: Patient tolerated treatment well Patient left: in chair;with call bell/phone within reach;with family/visitor present;Other (comment) (caae manager in to discuss d/c options for SNF rehab)   Time: 1129-1201 OT Time Calculation (min): 32 min Charges:  OT General Charges $OT Visit: 1 Procedure OT Evaluation $OT Eval Moderate Complexity: 1 Procedure OT Treatments $Self Care/Home Management : 8-22 mins $Therapeutic Activity: 8-22 mins G-Codes: OT G-codes **NOT FOR INPATIENT CLASS** Functional Assessment Tool Used: clinical judgement  Britt Bottom 08/26/2016, 1:44 PM

## 2016-08-26 NOTE — NC FL2 (Signed)
[place] LEVEL OF CARE SCREENING TOOL     IDENTIFICATION  Patient Name: [name] Birthdate: Mar 11, 1926 Sex: female Admission Date (Current Location): 08/24/2016  [facility] [place] Number:      Facility and Address:  [facility]. [employer], [place] [address], [place], [place] [address]      Provider Number: [id number]  Attending Physician Name and Address:  Elmarie Shiley, MD  Relative Name and Phone Number:       Current Level of Care: Hospital Recommended Level of Care: [place] Prior Approval Number:    Date Approved/Denied:   PASRR Number: [id number]  Discharge Plan: SNF    Current Diagnoses: Patient Active Problem List   Diagnosis Date Noted  . Pressure injury of skin 08/26/2016  . Syncope 08/24/2016  . CKD (chronic kidney disease) stage 4, GFR 15-29 ml/min (HCC) 08/24/2016  . Acute kidney injury ([place]) 08/24/2016  . Thoracic compression fracture ([place]) 08/24/2016  . Diabetes mellitus type 2, uncontrolled ([place]) 08/24/2016  . Acute kidney injury superimposed on chronic kidney disease ([place])   . Uncontrolled type 2 diabetes mellitus with complication ([place])   . Chronic diastolic congestive heart failure ([place]) 02/28/2016  . Dizziness 09/13/2015  . UTI (lower urinary tract infection)   . Palliative care encounter   . Multisystem organ failure   . Pulmonary hypertension ([place])   . Tricuspid valve regurgitation   . Pyelonephritis 08/31/2015  . Subtherapeutic international normalized ratio (INR) 08/31/2015  . Pulmonary hypertensive venous disease ([place]) 08/28/2015  . Normal coronary arteries-June 2016 08/12/2015  . Severe obesity (BMI >= 40) ([place]) 06/09/2015  . 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 Anterior dislocation of left shoulder 05/19/2015  . Pulmonary hypertension, moderate to severe ([place]) 05/19/2015  . Choledocholithiasis 10/14/2012  . 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 Duodenal ulcer  with hemorrhage 09/03/2012  . Calculus of bile duct without mention of cholecystitis or obstruction 09/02/2012  . Calculus of gallbladder without mention of cholecystitis or obstruction 09/02/2012  . Hemorrhage of gastrointestinal tract, unspecified 08/30/2012  . Chronic anticoagulation-Warfarin 08/30/2012  . 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 HYPERCHOLESTEROLEMIA 02/08/2010  . Hypertensive cardiovascular disease 02/08/2010  . Chronic atrial fibrillation- CHADs VASc =6 02/08/2010  . Mitral valve regurgitation with secondary [facility] 02/08/2010    Orientation RESPIRATION BLADDER Height & Weight     Self, Time, Situation  O2 (2 L/ min) Incontinent Weight: 234 lb (106.1 kg) Height:  5\' 4"  (162.6 cm)  BEHAVIORAL SYMPTOMS/MOOD NEUROLOGICAL BOWEL NUTRITION STATUS   (None)  (None) Continent Diet  AMBULATORY STATUS COMMUNICATION OF NEEDS Skin   Limited Assist Verbally PU Stage and Appropriate Care   PU Stage 2 Dressing: (S)  (LT Buttocks Foam Dressing)                   Personal Care Assistance Level of Assistance  Bathing, Feeding, Dressing Bathing Assistance: Limited assistance Feeding assistance: Independent Dressing Assistance: Limited assistance     Functional Limitations Info  Sight, Hearing, Speech Sight Info: Impaired Hearing Info: Impaired Speech Info: Impaired    SPECIAL CARE FACTORS FREQUENCY  PT (By licensed PT), OT (By licensed OT)     PT Frequency: 5/ week OT Frequency: 5/ week            Contractures Contractures Info: Not present    Additional Factors Info  Insulin Sliding Scale Code Status Info: Full Allergies Info: Peach Prunus Persica   Insulin  Sliding Scale Info: insulin aspart (novoLOG) injection 0-15 Units Dose: 0-15 Units Freq: 3 times daily with meals Route: [place]       Current Medications (08/26/2016):  This is the current hospital active medication list Current Facility-Administered Medications  Medication Dose Route  Frequency Provider Last Rate Last Dose  . acetaminophen (TYLENOL) tablet 650 mg  650 mg Oral Q4H PRN Samella Parr, NP      . atorvastatin (LIPITOR) tablet 20 mg  20 mg Oral Daily Samella Parr, NP   20 mg at 08/26/16 1044  . gabapentin (NEURONTIN) capsule 300 mg  300 mg Oral QHS Samella Parr, NP   300 mg at 08/25/16 2120  . [START ON 08/27/2016] Influenza vac split quadrivalent PF (FLUARIX) injection 0.5 mL  0.5 mL Intramuscular Tomorrow-1000 Belkys A Regalado, MD      . insulin aspart (novoLOG) injection 0-15 Units  0-15 Units Subcutaneous TID WC Samella Parr, NP   3 Units at 08/26/16 1229  . insulin aspart (novoLOG) injection 0-5 Units  0-5 Units Subcutaneous QHS Samella Parr, NP   2 Units at 08/24/16 2118  . metoprolol tartrate (LOPRESSOR) tablet 12.5 mg  12.5 mg Oral BID Belkys A Regalado, MD   12.5 mg at 08/26/16 1042  . oxyCODONE (Oxy IR/ROXICODONE) immediate release tablet 5 mg  5 mg Oral Q4H PRN Samella Parr, NP      . pantoprazole (PROTONIX) EC tablet 40 mg  40 mg Oral BID AC Samella Parr, NP   40 mg at 08/26/16 [id number]  . sodium chloride flush (NS) 0.9 % injection 3 mL  3 mL Intravenous Q12H Samella Parr, NP   3 mL at 08/25/16 1041  . traMADol (ULTRAM) tablet 50 mg  50 mg Oral Q6H PRN Samella Parr, NP      . warfarin (COUMADIN) tablet 4 mg  4 mg Oral ONCE-1800 Romona Curls, [facility]      . Warfarin - Pharmacist Dosing Inpatient   Does not apply [place]         Discharge Medications: Please see discharge summary for a list of discharge medications.  Relevant Imaging Results:  Relevant Lab Results:   Additional Information SSN:[id number]  Samule Dry, LCSW

## 2016-08-26 NOTE — Progress Notes (Signed)
Physical Therapy Treatment Patient Details Name: [name] MRN: [id number] DOB: [birth year] Today's Date: 08/26/2016    History of Present Illness 82 yo admitted with syncope. Found to have T6 compression fx. PMHx: Afib, CHF, HTN, DM    PT Comments    Pt progressing well with mobility, she ambulated 15' x 2 with RW.  Pt denied dizziness with position changes. Re-checked orthostatic BP: supine 124/66, sitting 98/58, standing 95/79.   Follow Up Recommendations  SNF;Supervision/Assistance - 24 hour     Equipment Recommendations  None recommended by PT    Recommendations for Other Services OT consult     Precautions / Restrictions Precautions Precautions: Fall Precaution Comments: watch BP Restrictions Weight Bearing Restrictions: No    Mobility  Bed Mobility Overal bed mobility: Needs Assistance Bed Mobility: Supine to Sit Rolling: Min assist   Supine to sit: Min assist     General bed mobility comments: used rails, HOB up 30*, min A just to scoot hips to EOB  Transfers Overall transfer level: Needs assistance Equipment used: Rolling walker (2 wheeled) Transfers: Sit to/from Stand Sit to Stand: Min guard         General transfer comment: cues for hand placement and safety  Ambulation/Gait Ambulation/Gait assistance: Min guard Ambulation Distance (Feet): 30 Feet (15' x 2 with seated rest on commode) Assistive device: Rolling walker (2 wheeled) Gait Pattern/deviations: Step-through pattern;Decreased stride length   Gait velocity interpretation: Below normal speed for age/gender General Gait Details: ambulated with 6L O2 [place] (pt uses O2 at home), VCs for positioning in RW   Stairs            Wheelchair Mobility    Modified Rankin (Stroke Patients Only)       Balance Overall balance assessment: Needs assistance   Sitting balance-Leahy Scale: Good       Standing balance-Leahy Scale: Poor                      Cognition  Arousal/Alertness: Awake/alert Behavior During Therapy: WFL for tasks assessed/performed Overall Cognitive Status: Within Functional Limits for tasks assessed                      Exercises      General Comments        Pertinent Vitals/Pain Pain Assessment: No/denies pain Pain Score: 0-No pain Pain Location: B knees Pain Descriptors / Indicators: Aching;Sore Pain Intervention(s): Monitored during session;Repositioned    Home Living Family/patient expects to be discharged to:: Private residence Living Arrangements: Children Available Help at Discharge: Family;Available PRN/intermittently Type of Home: House Home Access: Ramped entrance   Home Layout: Two level;Bed/bath upstairs Home Equipment: Walker - 2 wheels;Cane - single point;Wheelchair - [employer] - 4 wheels;Shower seat;Adaptive equipment      Prior Function Level of Independence: Needs assistance  Gait / Transfers Assistance Needed: pt states she uses cane inside, stays upstairs in her bedroom and requires 2 person assist for stairs to exit house ADL's / Homemaking Assistance Needed: pt states she has assist with bathing and dressing     PT Goals (current goals can now be found in the care plan section) Acute Rehab PT Goals Patient Stated Goal: return home Time For Goal Achievement: 09/08/16 Potential to Achieve Goals: Fair Progress towards PT goals: Progressing toward goals    Frequency    Min 3X/week      PT Plan Current plan remains appropriate    Co-evaluation  End of Session Equipment Utilized During Treatment: Oxygen Activity Tolerance: Patient tolerated treatment well Patient left: with call bell/phone within reach;in chair     Time: 1444-1511 PT Time Calculation (min) (ACUTE ONLY): 27 min  Charges:  $Gait Training: 8-22 mins $Therapeutic Activity: 8-22 mins                    G Codes:      Philomena Doheny 08/26/2016, 3:18 PM [phone]

## 2016-08-26 NOTE — Clinical Social Work Note (Signed)
Clinical Social Work Assessment  Patient Details  Name: [name] MRN: [id number] Date of Birth: [birth year]  Date of referral:  08/26/16               Reason for consult:  Discharge Planning                Permission sought to share information with:  Facility Sport and exercise psychologist, Family Supports Permission granted to share information::  Yes, Release of Information Signed  Name::     Wheelwright,[name]  Agency::  SNFs  Relationship::  Daughter  Contact Information:     Housing/Transportation Living arrangements for the past 2 months:  [place] of Information:  Patient, Adult Children Patient Interpreter Needed:  None Criminal Activity/Legal Involvement Pertinent to Current Situation/Hospitalization:  No - Comment as needed Significant Relationships:  Adult Children Lives with:  Adult Children Do you feel safe going back to the place where you live?  Yes Need for family participation in patient care:  No (Coment)  Care giving concerns: The patient is agreeable for short term rehab at discharge. Patient would like to rebuild her strength to return home.   Social Worker assessment / plan: CSW met with patient at beside to complete assessment. Patient was resting comfortably in bed. Patient's daughter was also at bedside. CSW explained PT recommendation for SNF placement. CSW explained SNF search and placement process to the patient and patient's daughter and answered their questions Patient daughter would like SN[facility] with [facility]. CSW will follow up with bed offers. Employment status:  Retired Nurse, adult PT Recommendations:  [place] / Referral to community resources:  [place]  Patient/Family's Response to care:  The patient appears happy with the care she is receiving in hospital and is appreciative of CSW assistance.  Patient/Family's Understanding of and  Emotional Response to Diagnosis, Current Treatment, and Prognosis: The patient has a good understanding of why she was admitted. She understands the care plan and what she will need post discharge.  Emotional Assessment Appearance:  Appears older than stated age Attitude/Demeanor/Rapport:   (Patient was Appropriate.) Affect (typically observed):  Accepting, Appropriate, Calm Orientation:  Oriented to Self, Oriented to Place, Oriented to  Time, Oriented to Situation Alcohol / Substance use:  Not Applicable Psych involvement (Current and /or in the community):  No (Comment)  Discharge Needs  Concerns to be addressed:  Discharge Planning Concerns Readmission within the last 30 days:  No Current discharge risk:  Physical Impairment Barriers to Discharge:  Continued Medical Work up   [employer], [place] 08/26/2016, 1:41 PM

## 2016-08-26 NOTE — Procedures (Signed)
ELECTROENCEPHALOGRAM REPORT  Date of Study: 08/26/2016  Patient's Name: [name] MRN: [id number] Date of Birth: [birth year]  Referring Provider: Dr. Niel Hummer  Clinical History: This is an 82 year old woman with a syncopal episode.  Medications: gabapentin (NEURONTIN) capsule 300 mg  acetaminophen (TYLENOL) tablet 650 mg  atorvastatin (LIPITOR) tablet 20 mg  Influenza vac split quadrivalent PF (FLUARIX) injection 0.5 mL  insulin aspart (novoLOG) injection 0-15 Units  insulin aspart (novoLOG) injection 0-5 Units  metoprolol tartrate (LOPRESSOR) tablet 12.5 mg  oxyCODONE (Oxy IR/ROXICODONE) immediate release tablet 5 mg  pantoprazole (PROTONIX) EC tablet 40 mg  traMADol (ULTRAM) tablet 50 mg  warfarin (COUMADIN) tablet 4 mg   Technical Summary: A multichannel digital EEG recording measured by the international 10-20 system with electrodes applied with paste and impedances below 5000 ohms performed as portable with EKG monitoring in an awake and asleep patient.  Hyperventilation and photic stimulation were not performed.  The digital EEG was referentially recorded, reformatted, and digitally filtered in a variety of bipolar and referential montages for optimal display.   Description: The patient is awake and asleep during the recording.  During maximal wakefulness, there is a symmetric, medium voltage 8 Hz posterior dominant rhythm that attenuates with eye opening. This is admixed with occasional diffuse 4-5 Hz theta and 2-3 Hz delta slowing of the background.  During drowsiness and sleep, there is an increase in theta and delta slowing, with rare vertex waves seen.  Hyperventilation and photic stimulation were not performed.  There were no epileptiform discharges or electrographic seizures seen.    EKG lead showed irregular rhythm.  Impression: This awake and asleep EEG is abnormal due to mild to moderate diffuse slowing of the background.  Clinical Correlation of the  above findings indicates diffuse cerebral dysfunction that is non-specific in etiology and can be seen with hypoxic/ischemic injury, toxic/metabolic encephalopathies, neurodegenerative disorders, or medication effect.  The absence of epileptiform discharges does not rule out a clinical diagnosis of epilepsy.  Clinical correlation is advised.   Ellouise Newer, M.D.

## 2016-08-27 DIAGNOSIS — I482 Chronic atrial fibrillation: Secondary | ICD-10-CM

## 2016-08-27 DIAGNOSIS — I272 Other secondary pulmonary hypertension: Secondary | ICD-10-CM | POA: Diagnosis present

## 2016-08-27 DIAGNOSIS — Z6841 Body Mass Index (BMI) 40.0 and over, adult: Secondary | ICD-10-CM | POA: Diagnosis not present

## 2016-08-27 DIAGNOSIS — I130 Hypertensive heart and chronic kidney disease with heart failure and stage 1 through stage 4 chronic kidney disease, or unspecified chronic kidney disease: Secondary | ICD-10-CM | POA: Diagnosis present

## 2016-08-27 DIAGNOSIS — N179 Acute kidney failure, unspecified: Secondary | ICD-10-CM

## 2016-08-27 DIAGNOSIS — Z91018 Allergy to other foods: Secondary | ICD-10-CM | POA: Diagnosis not present

## 2016-08-27 DIAGNOSIS — Z853 Personal history of malignant neoplasm of breast: Secondary | ICD-10-CM | POA: Diagnosis not present

## 2016-08-27 DIAGNOSIS — E1122 Type 2 diabetes mellitus with diabetic chronic kidney disease: Secondary | ICD-10-CM | POA: Diagnosis present

## 2016-08-27 DIAGNOSIS — I5032 Chronic diastolic (congestive) heart failure: Secondary | ICD-10-CM

## 2016-08-27 DIAGNOSIS — S22000A Wedge compression fracture of unspecified thoracic vertebra, initial encounter for closed fracture: Secondary | ICD-10-CM

## 2016-08-27 DIAGNOSIS — E7800 Pure hypercholesterolemia, unspecified: Secondary | ICD-10-CM | POA: Diagnosis present

## 2016-08-27 DIAGNOSIS — Z23 Encounter for immunization: Secondary | ICD-10-CM | POA: Diagnosis not present

## 2016-08-27 DIAGNOSIS — I340 Nonrheumatic mitral (valve) insufficiency: Secondary | ICD-10-CM | POA: Diagnosis present

## 2016-08-27 DIAGNOSIS — I959 Hypotension, unspecified: Secondary | ICD-10-CM | POA: Diagnosis present

## 2016-08-27 DIAGNOSIS — E1165 Type 2 diabetes mellitus with hyperglycemia: Secondary | ICD-10-CM | POA: Diagnosis present

## 2016-08-27 DIAGNOSIS — M4854XA Collapsed vertebra, not elsewhere classified, thoracic region, initial encounter for fracture: Secondary | ICD-10-CM | POA: Diagnosis present

## 2016-08-27 DIAGNOSIS — N184 Chronic kidney disease, stage 4 (severe): Secondary | ICD-10-CM | POA: Diagnosis present

## 2016-08-27 DIAGNOSIS — E860 Dehydration: Secondary | ICD-10-CM | POA: Diagnosis present

## 2016-08-27 DIAGNOSIS — Z7901 Long term (current) use of anticoagulants: Secondary | ICD-10-CM | POA: Diagnosis not present

## 2016-08-27 DIAGNOSIS — R55 Syncope and collapse: Secondary | ICD-10-CM | POA: Diagnosis present

## 2016-08-27 DIAGNOSIS — Z9842 Cataract extraction status, left eye: Secondary | ICD-10-CM | POA: Diagnosis not present

## 2016-08-27 DIAGNOSIS — Z79899 Other long term (current) drug therapy: Secondary | ICD-10-CM | POA: Diagnosis not present

## 2016-08-27 DIAGNOSIS — W1830XA Fall on same level, unspecified, initial encounter: Secondary | ICD-10-CM | POA: Diagnosis present

## 2016-08-27 LAB — BASIC METABOLIC PANEL
Anion gap: 12 (ref 5–15)
BUN: 38 mg/dL — AB (ref 6–20)
CALCIUM: 9 mg/dL (ref 8.9–10.3)
CO2: 40 mmol/L — ABNORMAL HIGH (ref 22–32)
CREATININE: 1.59 mg/dL — AB (ref 0.44–1.00)
Chloride: 82 mmol/L — ABNORMAL LOW (ref 101–111)
GFR calc Af Amer: 33 mL/min — ABNORMAL LOW (ref >60)
GFR, EST NON AFRICAN AMERICAN: 28 mL/min — AB (ref >60)
GLUCOSE: 160 mg/dL — AB (ref 65–99)
Potassium: 4.4 mmol/L (ref 3.5–5.1)
Sodium: 134 mmol/L — ABNORMAL LOW (ref 135–145)

## 2016-08-27 LAB — GLUCOSE, CAPILLARY
GLUCOSE-CAPILLARY: 108 mg/dL — AB (ref 65–99)
GLUCOSE-CAPILLARY: 284 mg/dL — AB (ref 65–99)
Glucose-Capillary: 174 mg/dL — ABNORMAL HIGH (ref 65–99)
Glucose-Capillary: 261 mg/dL — ABNORMAL HIGH (ref 65–99)

## 2016-08-27 LAB — PROTIME-INR
INR: 1.91
PROTHROMBIN TIME: 22.2 s — AB (ref 11.4–15.2)

## 2016-08-27 MED ORDER — WARFARIN SODIUM 4 MG PO TABS
4.0000 mg | ORAL_TABLET | Freq: Once | ORAL | Status: AC
  Administered 2016-08-27: 4 mg via ORAL
  Filled 2016-08-27: qty 1

## 2016-08-27 MED ORDER — SENNOSIDES-DOCUSATE SODIUM 8.6-50 MG PO TABS
2.0000 | ORAL_TABLET | Freq: Two times a day (BID) | ORAL | Status: DC
  Administered 2016-08-27 (×2): 2 via ORAL
  Filled 2016-08-27 (×3): qty 2

## 2016-08-27 MED ORDER — POLYETHYLENE GLYCOL 3350 17 G PO PACK
17.0000 g | PACK | Freq: Every day | ORAL | Status: DC
  Administered 2016-08-27: 17 g via ORAL
  Filled 2016-08-27 (×2): qty 1

## 2016-08-27 MED ORDER — SODIUM CHLORIDE 0.9 % IV SOLN
INTRAVENOUS | Status: DC
  Administered 2016-08-27 – 2016-08-28 (×2): via INTRAVENOUS

## 2016-08-27 MED ORDER — SODIUM CHLORIDE 0.9 % IV BOLUS (SEPSIS)
500.0000 mL | Freq: Once | INTRAVENOUS | Status: AC
  Administered 2016-08-27: 500 mL via INTRAVENOUS

## 2016-08-27 NOTE — Care Management Note (Signed)
Case Management Note Marvetta Gibbons RN, BSN Unit 2W-Case Manager [phone]  Patient Details  Name: INTISAR LANDIN MRN: [id number] Date of Birth: 1926/04/27  Subjective/Objective:   Pt admitted with syncope                Action/Plan: PTA pt lived at home with daughter, per PT eval recommendations for SNF, in speaking with pt she wants to look into rehab options- consult placed to CSW for STSNF needs-   Expected Discharge Date:                  Expected Discharge Plan:  Skilled Nursing Facility  In-House Referral:  Clinical Social Work  Discharge planning Services  CM Consult  Post Acute Care Choice:    Choice offered to:     DME Arranged:    DME Agency:     HH Arranged:    [place] Agency:     Status of Service:  Completed, signed off  If discussed at [employer], dates discussed:    Additional Comments:  08/27/16- Marvetta Gibbons - CSW following for placement needs- insurance auth pending with [employer]  Dahlia Client Romeo Rabon, RN 08/27/2016, 10:36 AM

## 2016-08-27 NOTE — Progress Notes (Addendum)
PROGRESS NOTE    [name]  [id number] DOB: 1926/06/01 DOA: 08/24/2016 PCP: Maximino Greenland, MD   Brief Narrative: [name] is a 82 y.o. female with medical history significant for atrial fibrillation, history of chronic diastolic congestive heart failure in setting of mitral valve regurgitation due to secondary pulmonary hypertension, dyslipidemia, severe obesity, history of prior syncope, chronic kidney disease stage IV. Patient reports that today after getting up from a seated position to walk across the room patient had a syncopal episode. Patient does not recall actually going down that she remembers waking up on the floor. She does remember having a coughing spell right before the syncopal episode. She was not incontinent of bowel or bladder and according to family she was immediately back to her normal mentation without any signs that would be concerning for postictal phase. She was not having any pain prior to the onset of the episode she's not started any new medications she has not missed any medications recently. She does report recent issues with coughing different from her chronic cough for 2 days with yellow productive sputum but no fevers or chills to poor oral intake.   CT cervical spine out contrast: No acute abnormalities CT head without contrast: No skull fracture and cranial hemorrhage DG thoracic spine with swimmer's view: Osseous demineralization with scattered degenerative changes in the new mild superior endplate compression fracture of T6  DG right shoulder: No acute osseous abnormalities DG sacrum and coccyx: No definite acute osseous abnormalities PCXR: No acute abnormalities Lab data: Sodium 134, potassium 3.8, chloride 80, CO2 41, BUN 51, creatinine 2.17, glucose 374, anion gap 13, BNP 245, poc troponin 0.01, WBC 8800 differential not obtained, hemoglobin 10, platelets 205,000, PT 23, INR 2.01, urinalysis unremarkable Medications and treatments:  Normal saline bolus 500 mL 1, Tylenol 650 mg by mouth 1    Assessment & Plan:   Principal Problem:   Syncope Active Problems:   HYPERCHOLESTEROLEMIA   Hypertensive cardiovascular disease   Chronic atrial fibrillation- CHADs VASc =6   Mitral valve regurgitation with secondary PH   Pulmonary hypertension, moderate to severe (HCC)   Severe obesity (BMI >= 40) (HCC)   Chronic diastolic congestive heart failure (HCC)   CKD (chronic kidney disease) stage 4, GFR 15-29 ml/min (HCC)   Acute kidney injury (HCC)   Thoracic compression fracture (HCC)   Diabetes mellitus type 2, uncontrolled (HCC)   Acute kidney injury superimposed on chronic kidney disease ([place])   Uncontrolled type 2 diabetes mellitus with complication (HCC)   Pressure injury of skin    Syncope and collapse -Patient presents with syncope that developed after patient went from sitting to standing position with reports of coughing episode prior to syncopal event; patient also has lab data concerning for volume depletion and acute kidney injury although was not orthostatic when checked in the ER (after given fluid challenge) -Suspect syncope mediated by dehydration -Echocardiogram normal EF.  -per grand-daughter patient had another seizure " episode she become weak, eyes rolls back, mild tremors". Today when she was transition from bed to chair. Patient has had this episodes for months, her doctors are aware.  -I think episode might be related to hypotension, hypoperfusion. Continue to hold diuretics.  EGGmild to moderate diffuse slowing, absense of epileptiform . -another episode of eye rolling when getting up to the bedside commode with vomiting x2 on 9/20 am,patient report poor appetite, + orthostatic vital signs, will restart ivf, check am cortisol level    Acute kidney  injury on CKD (chronic kidney disease) stage 4, GFR 15-29 ml/min  -Baseline renal function has been 27/1.44 on average -At the end of August BUN had  increased to 44 with a creatinine of 1.61 -Continue to hold diuretics.  -Hold Demadex and Zaroxolyn Recent Labs       Lab Results  Component Value Date   CREATININE 2.17 (H) 08/24/2016   CREATININE 1.61 (H) 08/04/2016   CREATININE 1.32 (H) 07/23/2016     cr trending down. Continue to hold diuretics.     Acute T6 compression fracture -Patient is not reporting back pain -I have ordered Tylenol for mild pain, Ultram for moderate pain and low-dose oxycodone for severe pain    Diabetes mellitus 2, uncontrolled with complication -Follow CBGs and provide SS I    Chronic atrial fibrillation- CHADs VASc =6 -Mildly tachycardic and likely related to dehydration -continue with lower dose metoprolol.     Mitral valve regurgitation with secondary PH/Chronic diastolic congestive heart failure /Pulmonary hypertension, moderate to severe  -Currently compensated -Diuretics on hold for now    HYPERCHOLESTEROLEMIA -Continue preadmission Lipitor    Severe obesity (BMI >= 40)  -diet education.   no bm since Saturday, some left lower abdominal cramping, no fever, will repeat orthostatic vital sign, likely will need some ivf, will start miralax and sennakot, may need enema if no result,   DVT prophylaxis: on coumadin  Code Status: Full code.  Family Communication: care discussed with granddaughter at bedside in am and daughter in room in the afternoon Disposition Plan: SNF in 24 to 48 hours.    Consultants:   none   Procedures: none   Antimicrobials:    Subjective: Had another episode where she became lightheaded, eyes roll back while she is getting up to bedside commode, vomited x2, no bm since Saturday, some mild left lower abdominal cramping  Objective: Vitals:   08/26/16 1552 08/26/16 2126 08/27/16 0417 08/27/16 1009  BP:  (!) 109/54 114/72 (!) 156/106  Pulse: 80 87 81 100  Resp: 18 16 20 20   Temp: 97.8 F (36.6 C) 98.4 F (36.9 C) 98.1 F (36.7 C) 98.1 F  (36.7 C)  TempSrc: Oral Oral Oral Oral  SpO2: 98% 100% 100%   Weight:   105.2 kg (231 lb 14.4 oz)   Height:        Intake/Output Summary (Last 24 hours) at 08/27/16 1014 Last data filed at 08/27/16 [id number]  Gross per 24 hour  Intake              240 ml  Output             1550 ml  Net            -1310 ml   Filed Weights   08/25/16 0510 08/26/16 0548 08/27/16 0417  Weight: 104.6 kg (230 lb 11.2 oz) 106.1 kg (234 lb) 105.2 kg (231 lb 14.4 oz)    Examination:  General exam: Appears calm and comfortable , orientedx3 Respiratory system: Clear to auscultation. Respiratory effort normal. Cardiovascular system: S1 & S2 heard, IRRR. No JVD, murmurs, rubs, gallops or clicks. No pedal edema. Gastrointestinal system: Abdomen is nondistended, soft and nontender. No organomegaly or masses felt. Normal bowel sounds heard. Central nervous system: Alert and oriented. No focal neurological deficits. Extremities: Symmetric 5 x 5 power. Skin: No rashes, lesions or ulcers Psychiatry: Judgement and insight appear normal. Mood & affect appropriate.     Data Reviewed: I have personally reviewed following labs and  imaging studies  CBC:  Recent Labs Lab 08/24/16 1155 08/25/16 0238  WBC 8.8 8.8  HGB 10.0* 10.4*  HCT 33.5* 34.9*  MCV 98.5 98.9  PLT 205 [id number]   Basic Metabolic Panel:  Recent Labs Lab 08/24/16 1155 08/25/16 0238 08/26/16 0927 08/27/16 0356  NA 134* 136 133* 134*  K 3.8 3.4* 3.7 4.4  CL 80* 79* 78* 82*  CO2 41* 45* 43* 40*  GLUCOSE 374* 270* 200* 160*  BUN 51* 46* 43* 38*  CREATININE 2.17* 1.76* 1.60* 1.59*  CALCIUM 9.1 9.1 8.7* 9.0   GFR: Estimated Creatinine Clearance: 29.5 mL/min (by C-G formula based on SCr of 1.59 mg/dL (H)). Liver Function Tests: No results for input(s): AST, ALT, ALKPHOS, BILITOT, PROT, ALBUMIN in the last 168 hours. No results for input(s): LIPASE, AMYLASE in the last 168 hours. No results for input(s): AMMONIA in the last 168  hours. Coagulation Profile:  Recent Labs Lab 08/24/16 1307 08/25/16 0238 08/26/16 0219 08/27/16 0356  INR 2.01 2.06 2.02 1.91   Cardiac Enzymes: No results for input(s): CKTOTAL, CKMB, CKMBINDEX, TROPONINI in the last 168 hours. BNP (last 3 results) No results for input(s): PROBNP in the last 8760 hours. HbA1C: No results for input(s): HGBA1C in the last 72 hours. CBG:  Recent Labs Lab 08/26/16 1123 08/26/16 1221 08/26/16 1622 08/26/16 2210 08/27/16 0632  GLUCAP 210* 198* 136* 157* 174*   Lipid Profile: No results for input(s): CHOL, HDL, LDLCALC, TRIG, CHOLHDL, LDLDIRECT in the last 72 hours. Thyroid Function Tests: No results for input(s): TSH, T4TOTAL, FREET4, T3FREE, THYROIDAB in the last 72 hours. Anemia Panel: No results for input(s): VITAMINB12, FOLATE, FERRITIN, TIBC, IRON, RETICCTPCT in the last 72 hours. Sepsis Labs: No results for input(s): PROCALCITON, LATICACIDVEN in the last 168 hours.  No results found for this or any previous visit (from the past 240 hour(s)).       Radiology Studies: No results found.      Scheduled Meds: . atorvastatin  20 mg Oral Daily  . gabapentin  300 mg Oral QHS  . Influenza vac split quadrivalent PF  0.5 mL Intramuscular Tomorrow-1000  . insulin aspart  0-15 Units Subcutaneous TID WC  . insulin aspart  0-5 Units Subcutaneous QHS  . metoprolol tartrate  12.5 mg Oral BID  . pantoprazole  40 mg Oral BID AC  . polyethylene glycol  17 g Oral Daily  . senna-docusate  2 tablet Oral BID  . sodium chloride flush  3 mL Intravenous Q12H  . Warfarin - Pharmacist Dosing Inpatient   Does not apply q1800   Continuous Infusions:    LOS: 0 days    Time spent: 35 minutes.     Florencia Reasons, MD PhD [facility] Pager [phone]  If 7PM-7AM, please contact night-coverage [email or web address] Password [email or web address] 08/27/2016, 10:14 AM

## 2016-08-27 NOTE — Progress Notes (Signed)
[facility] for Coumadin Indication: atrial fibrillation  Allergies  Allergen Reactions  . Peach [Prunus Persica] Rash    AGENT: peach fuzz    Patient Measurements: Height: 5\' 4"  (162.6 cm) Weight: 231 lb 14.4 oz (105.2 kg) IBW/kg (Calculated) : 54.7 Heparin Dosing Weight: n/a  Vital Signs: Temp: 98.1 F (36.7 C) (09/20 1009) Temp Source: Oral (09/20 1009) BP: 156/106 (09/20 1009) Pulse Rate: 100 (09/20 1009)  Labs:  Recent Labs  08/24/16 1155  08/25/16 0238 08/26/16 0219 08/26/16 0927 08/27/16 0356  HGB 10.0*  --  10.4*  --   --   --   HCT 33.5*  --  34.9*  --   --   --   PLT 205  --  197  --   --   --   LABPROT  --   < > 23.5* 23.2*  --  22.2*  INR  --   < > 2.06 2.02  --  1.91  CREATININE 2.17*  --  1.76*  --  1.60* 1.59*  < > = values in this interval not displayed.  Estimated Creatinine Clearance: 29.5 mL/min (by C-G formula based on SCr of 1.59 mg/dL (H)).   Medical History: Past Medical History:  Diagnosis Date  . Anemia   . Arthritis   . Breast cancer ([place]) 2012   right  . CHF (congestive heart failure) ([place])   . Cholelithiasis   . DCIS (ductal carcinoma in situ) of breast 07/15/2010   DCIS s/p lumpectomy and radiation 2011  . Diabetes mellitus   . DVT (deep venous thrombosis) ([place])   . GERD (gastroesophageal reflux disease)   . GI bleed   . Heart murmur   . Hyperlipidemia   . Hypertension   . Peripheral neuropathy ([place])   . Shortness of breath dyspnea   . Syncope 05/09/2015    Assessment: 82 yo female on chronic Coumadin for afib. Admitted with fall, CT head negative. INR down a bit today on home dose at 1.91. PTA Coumadin dose 2mg  daily except 4 mg on Thursdays (last dose PTA on 9/17). Hg low stable, plt wnl. No bleed documented.  Goal of Therapy:  INR 2-3 Monitor platelets by anticoagulation protocol: Yes   Plan:  Warfarin 4mg  x 1 dose tonight Daily PT/INR Monitor for s/sx bleeding  Elicia Lamp,  PharmD, BCPS Clinical Pharmacist Pager [phone] 08/27/2016 11:11 AM

## 2016-08-28 ENCOUNTER — Telehealth: Payer: Self-pay | Admitting: Physician Assistant

## 2016-08-28 ENCOUNTER — Inpatient Hospital Stay (HOSPITAL_COMMUNITY): Payer: Commercial Managed Care - HMO

## 2016-08-28 DIAGNOSIS — K264 Chronic or unspecified duodenal ulcer with hemorrhage: Secondary | ICD-10-CM | POA: Diagnosis not present

## 2016-08-28 DIAGNOSIS — K922 Gastrointestinal hemorrhage, unspecified: Secondary | ICD-10-CM | POA: Diagnosis not present

## 2016-08-28 DIAGNOSIS — N183 Chronic kidney disease, stage 3 (moderate): Secondary | ICD-10-CM | POA: Diagnosis not present

## 2016-08-28 DIAGNOSIS — W19XXXA Unspecified fall, initial encounter: Secondary | ICD-10-CM | POA: Diagnosis not present

## 2016-08-28 DIAGNOSIS — E662 Morbid (severe) obesity with alveolar hypoventilation: Secondary | ICD-10-CM | POA: Diagnosis not present

## 2016-08-28 DIAGNOSIS — I4891 Unspecified atrial fibrillation: Secondary | ICD-10-CM | POA: Diagnosis not present

## 2016-08-28 DIAGNOSIS — R278 Other lack of coordination: Secondary | ICD-10-CM | POA: Diagnosis not present

## 2016-08-28 DIAGNOSIS — E785 Hyperlipidemia, unspecified: Secondary | ICD-10-CM | POA: Diagnosis not present

## 2016-08-28 DIAGNOSIS — Z853 Personal history of malignant neoplasm of breast: Secondary | ICD-10-CM | POA: Diagnosis not present

## 2016-08-28 DIAGNOSIS — E1122 Type 2 diabetes mellitus with diabetic chronic kidney disease: Secondary | ICD-10-CM | POA: Diagnosis not present

## 2016-08-28 DIAGNOSIS — I340 Nonrheumatic mitral (valve) insufficiency: Secondary | ICD-10-CM | POA: Diagnosis present

## 2016-08-28 DIAGNOSIS — Z7901 Long term (current) use of anticoagulants: Secondary | ICD-10-CM | POA: Diagnosis not present

## 2016-08-28 DIAGNOSIS — Z79899 Other long term (current) drug therapy: Secondary | ICD-10-CM | POA: Diagnosis not present

## 2016-08-28 DIAGNOSIS — Z87898 Personal history of other specified conditions: Secondary | ICD-10-CM | POA: Diagnosis not present

## 2016-08-28 DIAGNOSIS — Z923 Personal history of irradiation: Secondary | ICD-10-CM | POA: Diagnosis not present

## 2016-08-28 DIAGNOSIS — N179 Acute kidney failure, unspecified: Secondary | ICD-10-CM | POA: Diagnosis not present

## 2016-08-28 DIAGNOSIS — S22059S Unspecified fracture of T5-T6 vertebra, sequela: Secondary | ICD-10-CM | POA: Diagnosis not present

## 2016-08-28 DIAGNOSIS — I959 Hypotension, unspecified: Secondary | ICD-10-CM | POA: Diagnosis present

## 2016-08-28 DIAGNOSIS — E1165 Type 2 diabetes mellitus with hyperglycemia: Secondary | ICD-10-CM | POA: Diagnosis present

## 2016-08-28 DIAGNOSIS — E118 Type 2 diabetes mellitus with unspecified complications: Secondary | ICD-10-CM | POA: Diagnosis not present

## 2016-08-28 DIAGNOSIS — J80 Acute respiratory distress syndrome: Secondary | ICD-10-CM | POA: Diagnosis not present

## 2016-08-28 DIAGNOSIS — N189 Chronic kidney disease, unspecified: Secondary | ICD-10-CM

## 2016-08-28 DIAGNOSIS — I272 Other secondary pulmonary hypertension: Secondary | ICD-10-CM | POA: Diagnosis present

## 2016-08-28 DIAGNOSIS — Z23 Encounter for immunization: Secondary | ICD-10-CM | POA: Diagnosis not present

## 2016-08-28 DIAGNOSIS — E119 Type 2 diabetes mellitus without complications: Secondary | ICD-10-CM

## 2016-08-28 DIAGNOSIS — I482 Chronic atrial fibrillation: Secondary | ICD-10-CM | POA: Diagnosis not present

## 2016-08-28 DIAGNOSIS — M4854XA Collapsed vertebra, not elsewhere classified, thoracic region, initial encounter for fracture: Secondary | ICD-10-CM | POA: Diagnosis present

## 2016-08-28 DIAGNOSIS — W1830XA Fall on same level, unspecified, initial encounter: Secondary | ICD-10-CM | POA: Diagnosis present

## 2016-08-28 DIAGNOSIS — N184 Chronic kidney disease, stage 4 (severe): Secondary | ICD-10-CM | POA: Diagnosis not present

## 2016-08-28 DIAGNOSIS — Z6841 Body Mass Index (BMI) 40.0 and over, adult: Secondary | ICD-10-CM | POA: Diagnosis not present

## 2016-08-28 DIAGNOSIS — Z86718 Personal history of other venous thrombosis and embolism: Secondary | ICD-10-CM | POA: Diagnosis not present

## 2016-08-28 DIAGNOSIS — I5033 Acute on chronic diastolic (congestive) heart failure: Secondary | ICD-10-CM | POA: Diagnosis not present

## 2016-08-28 DIAGNOSIS — R140 Abdominal distension (gaseous): Secondary | ICD-10-CM | POA: Diagnosis not present

## 2016-08-28 DIAGNOSIS — E860 Dehydration: Secondary | ICD-10-CM | POA: Diagnosis present

## 2016-08-28 DIAGNOSIS — M6281 Muscle weakness (generalized): Secondary | ICD-10-CM | POA: Diagnosis not present

## 2016-08-28 DIAGNOSIS — E7800 Pure hypercholesterolemia, unspecified: Secondary | ICD-10-CM | POA: Diagnosis present

## 2016-08-28 DIAGNOSIS — R55 Syncope and collapse: Secondary | ICD-10-CM | POA: Diagnosis not present

## 2016-08-28 DIAGNOSIS — I10 Essential (primary) hypertension: Secondary | ICD-10-CM | POA: Diagnosis not present

## 2016-08-28 DIAGNOSIS — I130 Hypertensive heart and chronic kidney disease with heart failure and stage 1 through stage 4 chronic kidney disease, or unspecified chronic kidney disease: Secondary | ICD-10-CM | POA: Diagnosis not present

## 2016-08-28 DIAGNOSIS — I5043 Acute on chronic combined systolic (congestive) and diastolic (congestive) heart failure: Secondary | ICD-10-CM | POA: Diagnosis not present

## 2016-08-28 DIAGNOSIS — I5032 Chronic diastolic (congestive) heart failure: Secondary | ICD-10-CM | POA: Diagnosis not present

## 2016-08-28 DIAGNOSIS — E669 Obesity, unspecified: Secondary | ICD-10-CM | POA: Diagnosis not present

## 2016-08-28 DIAGNOSIS — Z9981 Dependence on supplemental oxygen: Secondary | ICD-10-CM | POA: Diagnosis not present

## 2016-08-28 DIAGNOSIS — K5900 Constipation, unspecified: Secondary | ICD-10-CM | POA: Diagnosis not present

## 2016-08-28 LAB — GLUCOSE, CAPILLARY
GLUCOSE-CAPILLARY: 143 mg/dL — AB (ref 65–99)
Glucose-Capillary: 228 mg/dL — ABNORMAL HIGH (ref 65–99)

## 2016-08-28 LAB — BASIC METABOLIC PANEL
Anion gap: 8 (ref 5–15)
BUN: 30 mg/dL — AB (ref 6–20)
CHLORIDE: 85 mmol/L — AB (ref 101–111)
CO2: 42 mmol/L — ABNORMAL HIGH (ref 22–32)
Calcium: 8.6 mg/dL — ABNORMAL LOW (ref 8.9–10.3)
Creatinine, Ser: 1.32 mg/dL — ABNORMAL HIGH (ref 0.44–1.00)
GFR calc Af Amer: 41 mL/min — ABNORMAL LOW (ref >60)
GFR calc non Af Amer: 35 mL/min — ABNORMAL LOW (ref >60)
Glucose, Bld: 170 mg/dL — ABNORMAL HIGH (ref 65–99)
POTASSIUM: 2.8 mmol/L — AB (ref 3.5–5.1)
SODIUM: 135 mmol/L (ref 135–145)

## 2016-08-28 LAB — CBC
HCT: 31.3 % — ABNORMAL LOW (ref 36.0–46.0)
HEMOGLOBIN: 9.3 g/dL — AB (ref 12.0–15.0)
MCH: 29 pg (ref 26.0–34.0)
MCHC: 29.7 g/dL — ABNORMAL LOW (ref 30.0–36.0)
MCV: 97.5 fL (ref 78.0–100.0)
Platelets: 168 10*3/uL (ref 150–400)
RBC: 3.21 MIL/uL — AB (ref 3.87–5.11)
RDW: 13 % (ref 11.5–15.5)
WBC: 7.6 10*3/uL (ref 4.0–10.5)

## 2016-08-28 LAB — PROTIME-INR
INR: 2.13
Prothrombin Time: 24.2 seconds — ABNORMAL HIGH (ref 11.4–15.2)

## 2016-08-28 LAB — CORTISOL: Cortisol, Plasma: 8.6 ug/dL

## 2016-08-28 MED ORDER — POTASSIUM CHLORIDE CRYS ER 20 MEQ PO TBCR
40.0000 meq | EXTENDED_RELEASE_TABLET | ORAL | Status: AC
  Administered 2016-08-28 (×2): 40 meq via ORAL
  Filled 2016-08-28 (×2): qty 2

## 2016-08-28 MED ORDER — MAGNESIUM OXIDE 400 (241.3 MG) MG PO TABS: 400.0000 mg | ORAL_TABLET | Freq: Every day | ORAL | 0 refills | Status: DC

## 2016-08-28 MED ORDER — WARFARIN SODIUM 3 MG PO TABS: 3.0000 mg | ORAL_TABLET | Freq: Once | ORAL | Status: DC

## 2016-08-28 NOTE — Progress Notes (Signed)
PT Cancellation Note  Patient Details Name: [name] MRN: [id number] DOB: February 11, 1926   Cancelled Treatment:    Reason Eval/Treat Not Completed: Other (comment) (Declined as she is leaving today).  DC in a few minutes, pt heading home.  Invited pt and family to call [facility] if she has questions once she gets there.   Ramond Dial 08/28/2016, 1:13 PM    Mee Hives, PT MS Acute Rehab Dept. Number: [place] and [place]

## 2016-08-28 NOTE — Discharge Instructions (Signed)

## 2016-08-28 NOTE — Discharge Summary (Signed)
Discharge Summary  [name] [id number] DOB: Apr 28, 1926  PCP: Diane Greenland, MD  Admit date: 08/24/2016 Discharge date: 08/28/2016  Time spent: >27mins  Recommendations for Outpatient Follow-up:  1. F/u with SNF MD for hospital discharge follow up, repeat cbc/bmp/INR at follow up 2. F/u with heart failure clinic (Dr Claris Gladden office) in one week, need to continue titrate diuretic dose and monitor k/mag 3. F/u with coumadin clinic 4. Continue home o2 at [place] 5. meds change at discharge: diuretic dose decreased, lopressor dose decreased, close follow up with cardiology for continue meds adjustment.   Discharge Diagnoses:  Active Hospital Problems   Diagnosis Date Noted  . Syncope 08/24/2016  . Pressure injury of skin 08/26/2016  . CKD (chronic kidney disease) stage 4, GFR 15-29 ml/min (HCC) 08/24/2016  . Acute kidney injury ([place]) 08/24/2016  . Thoracic compression fracture ([place]) 08/24/2016  . Diabetes mellitus type 2, uncontrolled ([place]) 08/24/2016  . Acute kidney injury superimposed on chronic kidney disease ([place])   . Uncontrolled type 2 diabetes mellitus with complication ([place])   . Chronic diastolic congestive heart failure ([place]) 02/28/2016  . Severe obesity (BMI >= 40) ([place]) 06/09/2015  . Pulmonary hypertension, moderate to severe ([place]) 05/19/2015  . Chronic atrial fibrillation- CHADs VASc =6 02/08/2010  . Hypertensive cardiovascular disease 02/08/2010  . HYPERCHOLESTEROLEMIA 02/08/2010  . Mitral valve regurgitation with secondary [facility] 02/08/2010    Resolved Hospital Problems   Diagnosis Date Noted Date Resolved  . Chronic diastolic heart failure ([place]) 08/28/2015 08/24/2016  . Syncope and collapse 05/09/2015 08/24/2016    Discharge Condition: stable  Diet recommendation: heart healthy/carb modified  Filed Weights   08/26/16 0548 08/27/16 0417 08/28/16 0429  Weight: 106.1 kg (234 lb) 105.2 kg (231 lb 14.4 oz) 107.7 kg (237 lb 6.4 oz)    History of  present illness:  Diane G Gallowayis a 82 y.o.femalewith medical history significant for atrial fibrillation, history of chronic diastolic congestive heart failure in setting of mitral valve regurgitation due to secondary pulmonary hypertension, dyslipidemia, severe obesity, history of prior syncope, chronic kidney disease stage IV. Patient reports that today after getting up from a seated position to walk across the room patient had a syncopal episode. Patient does not recall actually going down that she remembers waking up on the floor. She does remember having a coughing spell right before the syncopal episode. She was not incontinent of bowel or bladder and according to family she was immediately back to her normal mentation without any signs that would be concerning for postictal phase. She was not having any pain prior to the onset of the episode she's not started any new medications she has not missed any medications recently. She does report recent issues with coughing different from her chronic cough for 2 days with yellow productive sputum but no fevers or chills to poor oral intake.  CT cervical spine out contrast: No acute abnormalities CT head without contrast:No skull fracture and cranial hemorrhage DGthoracic spine with swimmer's view:Osseous demineralization with scattered degenerative changes in the new mild superior endplate compression fracture of T6  DG right shoulder:No acute osseous abnormalities DGsacrum and coccyx:No definite acute osseous abnormalities PCXR:No acute abnormalities Lab data:Sodium 134, potassium 3.8, chloride 80, CO2 41, BUN 51, creatinine 2.17, glucose 374, anion gap 13, BNP 245, poc troponin 0.01,WBC 8800 differential not obtained, hemoglobin 10, platelets 205,000, PT 23, INR 2.01, urinalysis unremarkable Medications and treatments:Normal saline bolus 500 mL 1,Tylenol 650 mg by mouth 1  Hospital Course:  Principal  Problem:   Syncope Active  Problems:   HYPERCHOLESTEROLEMIA   Hypertensive cardiovascular disease   Chronic atrial fibrillation- CHADs VASc =6   Mitral valve regurgitation with secondary PH   Pulmonary hypertension, moderate to severe (HCC)   Severe obesity (BMI >= 40) (HCC)   Chronic diastolic congestive heart failure (HCC)   CKD (chronic kidney disease) stage 4, GFR 15-29 ml/min (HCC)   Acute kidney injury ([place])   Thoracic compression fracture (HCC)   Diabetes mellitus type 2, uncontrolled ([place])   Acute kidney injury superimposed on chronic kidney disease ([place])   Uncontrolled type 2 diabetes mellitus with complication (HCC)   Pressure injury of skin   Syncopeand collapse -Patient presents with syncope that developed after patient went from sitting to standing position with reports of coughing episode prior to syncopal event; patient also has lab data concerning for volume depletion and acute kidney injury although was not orthostatic when checked in the ER (after given fluid challenge) -Suspect syncope mediated by dehydration -Echocardiogram normal EF.  -per grand-daughter patient had another seizure " episode she become weak, eyes rolls back, mild tremors". Today when she was transition from bed to chair. Patient has had this episodes for months, her doctors are aware.  -I think episode might be related to hypotension, hypoperfusion.  Diuretics held since admission.  EGGmild to moderatediffuse slowing, absense of epileptiform . -another episode of eye rolling when getting up to the bedside commode with vomiting x2 on 9/20 am,patient report poor appetite, + orthostatic vital signs, much improved with resuming ivf,  am cortisol level 8.6. -patient is euvolemic at discharge, repeat orthostatic vital signs unremarkable, patient did not have dizziness, no n/v when standing up on 9/21.  Acute kidney injury on CKD (chronic kidney disease) stage 4, GFR 15-29 ml/min -Baseline renal function has been 27/1.44 on  average -At the end of August BUN had increased to 44 with a creatinine of 1.61 -home meds Demadex and Zaroxolyn held since admission and she received ivf,cr back to baseline at discharge, resume diurectis at lower dose, close monitor renal function and close follow up with heart failure clinic. I have called cards master to ensure close follow up Recent Labs       Lab Results  Component Value Date   CREATININE 2.17 (H) 08/24/2016   CREATININE 1.61 (H) 08/04/2016   CREATININE 1.32 (H) 07/23/2016        Acute T6 compression fracture -Patient is not reporting back pain   Diabetes mellitus 2, uncontrolledwith complication -[id number] 7.1, not on meds before, need to start diet control and close monitor blood sugar  Chronic atrial fibrillation- CHADs VASc =6 -Mildly tachycardic and likely related to dehydration - metoprolol dose lowered, she is continued on coumadin, she is to close follow up with cardiology and coumadin clinic.  Mitral valve regurgitation with secondary PH/Chronic diastolic congestive heart failure /Pulmonary hypertension, moderate to severe  -Currently compensated -Diuretics held in the hospital , resumed at lower dose at discharge  HYPERCHOLESTEROLEMIA -Continue preadmission Lipitor  Severe obesity (BMI >= 40)  -diet education.   Constipation: kub unremarkable, resolved at discharge  Chronic hypoxic respiratory failure: continue home 02 at [employer].   DVT prophylaxis: on coumadin  Code Status: Full code.  Family Communication: care discussed with granddaughter at bedside and daughter over the phone Disposition Plan: SNF on 9/21   Consultants:   none   Procedures: none   Antimicrobials: none    Discharge Exam: BP 122/67 (BP Location: Right Arm)  Pulse 78   Temp 97.5 F (36.4 C) (Oral)   Resp 20   Ht 5\' 4"  (1.626 m)   Wt 107.7 kg (237 lb 6.4 oz)   SpO2 100%   BMI 40.75 kg/m   General: NAD, on  chronic o2 at 3liter Cardiovascular: IRRR Respiratory: CTABL  Discharge Instructions You were cared for by a hospitalist during your hospital stay. If you have any questions about your discharge medications or the care you received while you were in the hospital after you are discharged, you can call the unit and asked to speak with the hospitalist on call if the hospitalist that took care of you is not available. Once you are discharged, your primary care physician will handle any further medical issues. Please note that NO REFILLS for any discharge medications will be authorized once you are discharged, as it is imperative that you return to your primary care physician (or establish a relationship with a primary care physician if you do not have one) for your aftercare needs so that they can reassess your need for medications and monitor your lab values.  Discharge Instructions    Diet - low sodium heart healthy    Complete by:  As directed    Carb modified   Increase activity slowly    Complete by:  As directed        Medication List    TAKE these medications   acetaminophen 500 MG tablet Commonly known as:  TYLENOL Take 1,000 mg by mouth 2 (two) times daily as needed for mild pain.   atorvastatin 20 MG tablet Commonly known as:  LIPITOR TAKE 1 TABLET BY MOUTH EVERY DAY   gabapentin 300 MG capsule Commonly known as:  NEURONTIN Take 300 mg by mouth at bedtime.   magnesium oxide 400 (241.3 Mg) MG tablet Commonly known as:  MAG-OX Take 1 tablet (400 mg total) by mouth daily.   metolazone 2.5 MG tablet Commonly known as:  ZAROXOLYN Take one tablet Every Saturday What changed:  how much to take  how to take this  when to take this  additional instructions   metoprolol tartrate 25 MG tablet Commonly known as:  LOPRESSOR Take 0.5 tablets (12.5 mg total) by mouth 2 (two) times daily. What changed:  how much to take   pantoprazole 40 MG tablet Commonly known as:   PROTONIX Take 1 tablet (40 mg total) by mouth 2 (two) times daily before a meal.   potassium chloride SA 20 MEQ tablet Commonly known as:  KLOR-CON M20 Take 1 tablet (20 mEq total) by mouth daily. Take an extra 2 tabs on Wednesdays What changed:  when to take this   torsemide 20 MG tablet Commonly known as:  DEMADEX Take 2 tablets (40 mg total) by mouth daily. What changed:  how much to take  when to take this   warfarin 2 MG tablet Commonly known as:  COUMADIN TAKE 1 TABLET BY MOUTH FOR 5 DAYS A WEEK, THEN 2 TABLETS ON THURSDAY AND SATURDAY What changed:  See the new instructions.      Allergies  Allergen Reactions  . Peach [Prunus Persica] Rash    AGENT: peach fuzz    Contact information for follow-up providers    Diane Greenland, MD .   Specialty:  Internal Medicine Why:  hospital discharge follow up Contact information: [address] [place] [address] [phone]        heart failure clinic Follow up in 1 week(s).  Why:  repeat bmp/mag at follow up, need to continue titrate heart failure meds including diuretics.       follow up with coumadin clinic .            Contact information for after-discharge care    [facility] .   Specialty:  [place] information: [address] [place] [phone]                   The results of significant diagnostics from this hospitalization (including imaging, microbiology, ancillary and laboratory) are listed below for reference.    Significant Diagnostic Studies: Dg Chest 1 View  Result Date: 08/24/2016 CLINICAL DATA:  [place] this morning in bedroom at home, RIGHT shoulder pain, low back pain, question loss of consciousness, history hypertension, CHF, diabetes mellitus, initial encounter EXAM: CHEST 1 VIEW COMPARISON:  09/01/2015 FINDINGS: Enlargement of cardiac silhouette with minimal pulmonary vascular  congestion. Atherosclerotic calcification aorta. Mild elevation of RIGHT diaphragm unchanged. Slight chronic accentuation of mid lung pulmonary markings. No definite acute infiltrate, pleural effusion or pneumothorax. Bones diffusely demineralized without definite fracture. IMPRESSION: Enlargement of cardiac silhouette with pulmonary vascular congestion. No acute abnormalities. Aortic atherosclerosis. Electronically Signed   By: Lavonia Dana M.D.   On: 08/24/2016 16:14   Dg Thoracic Spine W/swimmers  Result Date: 08/24/2016 CLINICAL DATA:  [place] this morning in bedroom at home, RIGHT shoulder pain, low back pain, question loss of consciousness, history hypertension, CHF, diabetes mellitus, initial encounter EXAM: THORACIC SPINE - 3 VIEWS COMPARISON:  Chest radiographs 09/01/2015, 08/31/2015, 06/08/2015 FINDINGS: Osseous demineralization. Twelve pairs of ribs. Multilevel disc space narrowing and endplate spur formation. Superior endplate compression fracture of T6 vertebral body with mild anterior height loss, new since prior exams. No additional fracture or subluxation. Degenerative disc disease changes of the cervical spine as well. IMPRESSION: Osseous demineralization with scattered degenerative changes and a new mild superior endplate compression fracture of T6. Electronically Signed   By: Lavonia Dana M.D.   On: 08/24/2016 16:18   Dg Sacrum/coccyx  Result Date: 08/24/2016 CLINICAL DATA:  [place] this morning in bedroom at home, RIGHT shoulder pain, low back pain, question loss of consciousness, history hypertension, CHF, diabetes mellitus, initial encounter EXAM: SACRUM AND COCCYX - 2+ VIEW COMPARISON:  CT abdomen pelvis 09/02/2012 FINDINGS: Osseous demineralization. SI joints preserved. Mild narrowing of hip joints bilaterally. Sacral foramina symmetric. No definite sacrococcygeal fracture identified though assessment of the distal sacrum is suboptimal. IMPRESSION: No definite acute osseous abnormalities.  Electronically Signed   By: Lavonia Dana M.D.   On: 08/24/2016 16:21   Dg Shoulder Right  Result Date: 08/24/2016 CLINICAL DATA:  [place] this morning in bedroom at home, RIGHT shoulder pain, low back pain, question loss of consciousness, history hypertension, CHF, diabetes mellitus, initial encounter EXAM: RIGHT SHOULDER - 2+ VIEW COMPARISON:  None FINDINGS: Osseous demineralization. AC joint alignment normal. No acute fracture, dislocation, or bone destruction. Visualized RIGHT ribs intact. Atherosclerotic calcifications of the RIGHT carotid system incidentally noted. IMPRESSION: No acute osseous abnormalities. RIGHT carotid atherosclerotic calcifications. Electronically Signed   By: Lavonia Dana M.D.   On: 08/24/2016 16:20   Dg Abd 1 View  Result Date: 08/28/2016 CLINICAL DATA:  82 year old female with vomiting yesterday. Recent syncopal episode. Initial encounter. EXAM: ABDOMEN - 1 VIEW COMPARISON:  CT Abdomen and Pelvis 09/02/2012. FINDINGS: Portable AP supine view at 0931 hours. Mild gaseous distension of the stomach but otherwise normal  bowel gas pattern, with multiple diminutive small bowel loops containing gas. Stable dystrophic calcifications in the pelvis. Aortoiliac calcified atherosclerosis noted. Degenerative changes in the lower lumbar spine. Motion artifact at the lung bases. No definite pneumoperitoneum on this supine view. IMPRESSION: Mild gaseous distension of the stomach. Overall normal bowel gas pattern. Electronically Signed   By: Genevie Ann M.D.   On: 08/28/2016 09:42   Ct Head Wo Contrast  Result Date: 08/24/2016 CLINICAL DATA:  Patient fell. Amnestic for event. Patient reports neck pain. EXAM: CT HEAD WITHOUT CONTRAST CT CERVICAL SPINE WITHOUT CONTRAST TECHNIQUE: Multidetector CT imaging of the head and cervical spine was performed following the standard protocol without intravenous contrast. Multiplanar CT image reconstructions of the cervical spine were also generated. COMPARISON:   05/19/2015. FINDINGS: CT HEAD FINDINGS Brain: No evidence of acute infarction, hemorrhage, hydrocephalus, extra-axial collection or mass lesion/mass effect. Generalized atrophy. Chronic microvascular ischemic change. Vascular: No hyperdense vessel.  Advanced vascular calcification. Skull: Normal. Negative for fracture or focal lesion. Sinuses/Orbits: No acute finding. Other: None. CT CERVICAL SPINE FINDINGS Alignment: Reversal of the normal cervical lordotic curve. Degenerative anterolisthesis C2-3. Degenerative retrolisthesis C3-4, C4-5, and C5-6. Vision anterolisthesis C7-T1. Skull base and vertebrae: No acute fracture. No primary bone lesion or focal pathologic process. Soft tissues and spinal canal: No intraspinal hematoma is evident. There is advanced atherosclerosis. Disc levels: All disc spaces demonstrate narrowing. Calcific protrusions at C3-4 and C4-5 potentially contribute to stenosis. Multilevel facet arthropathy appears worst at C2-3 on the LEFT, and C7-T1 bilaterally. Upper chest: No lung apex lesion. Prominence of the transverse arch, incompletely evaluated, suggesting aneurysmal dilatation. Other: Unremarkable. Compared with 2016, similar appearance. IMPRESSION: No skull fracture or intracranial hemorrhage. Atrophy and small vessel disease. No cervical spine fracture or traumatic subluxation. Advanced spondylosis. Dilated transverse arch of the aorta. Consider further evaluation with CTA chest on elective basis. Electronically Signed   By: Staci Righter M.D.   On: 08/24/2016 16:01   Ct Cervical Spine Wo Contrast  Result Date: 08/24/2016 CLINICAL DATA:  Patient fell. Amnestic for event. Patient reports neck pain. EXAM: CT HEAD WITHOUT CONTRAST CT CERVICAL SPINE WITHOUT CONTRAST TECHNIQUE: Multidetector CT imaging of the head and cervical spine was performed following the standard protocol without intravenous contrast. Multiplanar CT image reconstructions of the cervical spine were also generated.  COMPARISON:  05/19/2015. FINDINGS: CT HEAD FINDINGS Brain: No evidence of acute infarction, hemorrhage, hydrocephalus, extra-axial collection or mass lesion/mass effect. Generalized atrophy. Chronic microvascular ischemic change. Vascular: No hyperdense vessel.  Advanced vascular calcification. Skull: Normal. Negative for fracture or focal lesion. Sinuses/Orbits: No acute finding. Other: None. CT CERVICAL SPINE FINDINGS Alignment: Reversal of the normal cervical lordotic curve. Degenerative anterolisthesis C2-3. Degenerative retrolisthesis C3-4, C4-5, and C5-6. Vision anterolisthesis C7-T1. Skull base and vertebrae: No acute fracture. No primary bone lesion or focal pathologic process. Soft tissues and spinal canal: No intraspinal hematoma is evident. There is advanced atherosclerosis. Disc levels: All disc spaces demonstrate narrowing. Calcific protrusions at C3-4 and C4-5 potentially contribute to stenosis. Multilevel facet arthropathy appears worst at C2-3 on the LEFT, and C7-T1 bilaterally. Upper chest: No lung apex lesion. Prominence of the transverse arch, incompletely evaluated, suggesting aneurysmal dilatation. Other: Unremarkable. Compared with 2016, similar appearance. IMPRESSION: No skull fracture or intracranial hemorrhage. Atrophy and small vessel disease. No cervical spine fracture or traumatic subluxation. Advanced spondylosis. Dilated transverse arch of the aorta. Consider further evaluation with CTA chest on elective basis. Electronically Signed   By: Roderic Ovens.D.  On: 08/24/2016 16:01    Microbiology: No results found for this or any previous visit (from the past 240 hour(s)).   Labs: Basic Metabolic Panel:  Recent Labs Lab 08/24/16 1155 08/25/16 0238 08/26/16 0927 08/27/16 0356 08/28/16 0249  NA 134* 136 133* 134* 135  K 3.8 3.4* 3.7 4.4 2.8*  CL 80* 79* 78* 82* 85*  CO2 41* 45* 43* 40* 42*  GLUCOSE 374* 270* 200* 160* 170*  BUN 51* 46* 43* 38* 30*  CREATININE 2.17*  1.76* 1.60* 1.59* 1.32*  CALCIUM 9.1 9.1 8.7* 9.0 8.6*   Liver Function Tests: No results for input(s): AST, ALT, ALKPHOS, BILITOT, PROT, ALBUMIN in the last 168 hours. No results for input(s): LIPASE, AMYLASE in the last 168 hours. No results for input(s): AMMONIA in the last 168 hours. CBC:  Recent Labs Lab 08/24/16 1155 08/25/16 0238 08/28/16 0249  WBC 8.8 8.8 7.6  HGB 10.0* 10.4* 9.3*  HCT 33.5* 34.9* 31.3*  MCV 98.5 98.9 97.5  PLT 205 197 168   Cardiac Enzymes: No results for input(s): CKTOTAL, CKMB, CKMBINDEX, TROPONINI in the last 168 hours. BNP: BNP (last 3 results)  Recent Labs  06/17/16 1050 07/23/16 1050 08/24/16 1155  BNP 312.9* 289.2* 244.5*    ProBNP (last 3 results) No results for input(s): PROBNP in the last 8760 hours.  CBG:  Recent Labs Lab 08/27/16 0632 08/27/16 1115 08/27/16 1649 08/27/16 2110 08/28/16 0646  GLUCAP 174* 261* 108* 284* 143*       Signed:  Veronnica Hennings MD, PhD  [facility] 08/28/2016, 11:04 AM

## 2016-08-28 NOTE — Telephone Encounter (Signed)
Received request to [place] pager to arrange 1 week post-hospital follow-up. I have sent a message to our CHF clinic scheduler requesting a follow-up appointment, and office will call the patient with this information. Dayna Dunn PA-C

## 2016-08-28 NOTE — Consult Note (Signed)
   [facility] CM Inpatient Consult   08/28/2016  [name] [birth year] [id number]   Patient screened for potential [facility] Management services. Patient is eligible for [facility] Care Management services under patient's [facility] plan.  Chart review reveals an  82 y.o.femalewith medical history significant for atrial fibrillation, history of chronic diastolic congestive heart failure in setting of mitral valve regurgitation due to secondary pulmonary hypertension, dyslipidemia, obesity, history of prior syncope, chronic kidney disease stage IV.  Admitted with a syncopal episode.  Patient's current disposition is for a skilled nursing facility stay and no immediate community care management needs, at this time.  For questions, please contact:   Natividad Brood, RN BSN [facility] Liaison  [phone] business mobile phone Toll free office [phone]

## 2016-08-28 NOTE — Progress Notes (Signed)
ANTICOAGULATION CONSULT NOTE  Pharmacy Consult:  Coumadin Indication: atrial fibrillation  Allergies  Allergen Reactions  . Peach [Prunus Persica] Rash    AGENT: peach fuzz    Patient Measurements: Height: 5\' 4"  (162.6 cm) Weight: 237 lb 6.4 oz (107.7 kg) IBW/kg (Calculated) : 54.7  Vital Signs: Temp: 97.5 F (36.4 C) (09/21 0429) Temp Source: Oral (09/21 0429) BP: 122/67 (09/21 0429) Pulse Rate: 78 (09/21 0429)  Labs:  Recent Labs  08/26/16 0219 08/26/16 0927 08/27/16 0356 08/28/16 0249  HGB  --   --   --  9.3*  HCT  --   --   --  31.3*  PLT  --   --   --  168  LABPROT 23.2*  --  22.2* 24.2*  INR 2.02  --  1.91 2.13  CREATININE  --  1.60* 1.59* 1.32*    Estimated Creatinine Clearance: 36 mL/min (by C-G formula based on SCr of 1.32 mg/dL (H)).    Assessment: 89 YOF on chronic Coumadin for Afib.  Admitted with fall and head CT was negative.  INR is therapeutic; no bleeding reported.   Goal of Therapy:  INR 2-3    Plan:  - Coumadin 3mg  PO today - Daily PT / INR - F/U KCL supplementation - Consider checking a magnesium level   Senaida Chilcote D. Mina Marble, PharmD, BCPS Pager:  [phone] 08/28/2016, 8:16 AM

## 2016-08-28 NOTE — Progress Notes (Signed)
Patient will discharge to [facility] Anticipated discharge date: 9/21 Family notified: at bedside Transportation by [facility]- scheduled for 1:30pm  CSW signing off.  Jorge Ny, [place] Social Worker [phone]

## 2016-08-28 NOTE — Clinical Social Work Placement (Signed)
   CLINICAL SOCIAL WORK PLACEMENT  NOTE  Date:  08/28/2016  Patient Details  Name: [name] MRN: [id number] Date of Birth: 1926-05-07  Clinical Social Work is seeking post-discharge placement for this patient at the [place] level of care (*CSW will initial, date and re-position this form in  chart as items are completed):  Yes   Patient/family provided with [facility] Work Department's list of facilities offering this level of care within the geographic area requested by the patient (or if unable, by the patient's family).  Yes   Patient/family informed of their freedom to choose among providers that offer the needed level of care, that participate in Medicare, Medicaid or managed care program needed by the patient, have an available bed and are willing to accept the patient.  Yes   Patient/family informed of [facility]'s ownership interest in [facility] and [facility], as well as of the fact that they are under no obligation to receive care at these facilities.  PASRR submitted to EDS on 08/26/16     PASRR number received on 08/26/16     Existing PASRR number confirmed on       FL2 transmitted to all facilities in geographic area requested by pt/family on 08/26/16     FL2 transmitted to all facilities within larger geographic area on       Patient informed that his/her managed care company has contracts with or will negotiate with certain facilities, including the following:        Yes   Patient/family informed of bed offers received.  Patient chooses bed at [facility]     Physician recommends and patient chooses bed at      Patient to be transferred to [facility] on 08/28/16.  Patient to be transferred to facility by ptar     Patient family notified on 08/28/16 of transfer.  Name of family member notified:  at bedside     PHYSICIAN Please sign FL2, Please prepare priority discharge summary,  including medications     Additional Comment:    _______________________________________________ Jorge Ny, LCSW 08/28/2016, 12:00 PM

## 2016-09-04 ENCOUNTER — Telehealth (HOSPITAL_COMMUNITY): Payer: Self-pay | Admitting: Vascular Surgery

## 2016-09-04 DIAGNOSIS — I10 Essential (primary) hypertension: Secondary | ICD-10-CM | POA: Diagnosis not present

## 2016-09-04 DIAGNOSIS — I482 Chronic atrial fibrillation: Secondary | ICD-10-CM | POA: Diagnosis not present

## 2016-09-04 DIAGNOSIS — R55 Syncope and collapse: Secondary | ICD-10-CM | POA: Diagnosis not present

## 2016-09-04 NOTE — Telephone Encounter (Signed)
Left pt daughter message to make pt hosp f/u appt w/ NP/PA

## 2016-09-11 ENCOUNTER — Other Ambulatory Visit (HOSPITAL_COMMUNITY): Payer: Self-pay | Admitting: Adult Health

## 2016-09-12 ENCOUNTER — Ambulatory Visit (HOSPITAL_COMMUNITY)
Admission: RE | Admit: 2016-09-12 | Discharge: 2016-09-12 | Disposition: A | Discharge status: Home / Self Care | Payer: Commercial Managed Care - HMO | Source: Ambulatory Visit | Attending: Cardiology | Admitting: Cardiology

## 2016-09-12 VITALS — BP 108/70 | Wt 236.0 lb

## 2016-09-12 DIAGNOSIS — N183 Chronic kidney disease, stage 3 (moderate): Secondary | ICD-10-CM | POA: Diagnosis not present

## 2016-09-12 DIAGNOSIS — Z7901 Long term (current) use of anticoagulants: Secondary | ICD-10-CM | POA: Diagnosis not present

## 2016-09-12 DIAGNOSIS — Z923 Personal history of irradiation: Secondary | ICD-10-CM | POA: Insufficient documentation

## 2016-09-12 DIAGNOSIS — Z86718 Personal history of other venous thrombosis and embolism: Secondary | ICD-10-CM | POA: Insufficient documentation

## 2016-09-12 DIAGNOSIS — I482 Chronic atrial fibrillation, unspecified: Secondary | ICD-10-CM

## 2016-09-12 DIAGNOSIS — E1122 Type 2 diabetes mellitus with diabetic chronic kidney disease: Secondary | ICD-10-CM | POA: Insufficient documentation

## 2016-09-12 DIAGNOSIS — I272 Pulmonary hypertension, unspecified: Secondary | ICD-10-CM

## 2016-09-12 DIAGNOSIS — I130 Hypertensive heart and chronic kidney disease with heart failure and stage 1 through stage 4 chronic kidney disease, or unspecified chronic kidney disease: Secondary | ICD-10-CM | POA: Insufficient documentation

## 2016-09-12 DIAGNOSIS — Z87898 Personal history of other specified conditions: Secondary | ICD-10-CM | POA: Insufficient documentation

## 2016-09-12 DIAGNOSIS — I5032 Chronic diastolic (congestive) heart failure: Secondary | ICD-10-CM | POA: Diagnosis not present

## 2016-09-12 DIAGNOSIS — E662 Morbid (severe) obesity with alveolar hypoventilation: Secondary | ICD-10-CM | POA: Diagnosis not present

## 2016-09-12 DIAGNOSIS — Z0389 Encounter for observation for other suspected diseases and conditions ruled out: Secondary | ICD-10-CM

## 2016-09-12 DIAGNOSIS — Z9981 Dependence on supplemental oxygen: Secondary | ICD-10-CM | POA: Diagnosis not present

## 2016-09-12 DIAGNOSIS — N184 Chronic kidney disease, stage 4 (severe): Secondary | ICD-10-CM

## 2016-09-12 DIAGNOSIS — Z79899 Other long term (current) drug therapy: Secondary | ICD-10-CM | POA: Insufficient documentation

## 2016-09-12 DIAGNOSIS — IMO0001 Reserved for inherently not codable concepts without codable children: Secondary | ICD-10-CM

## 2016-09-12 DIAGNOSIS — Z6841 Body Mass Index (BMI) 40.0 and over, adult: Secondary | ICD-10-CM | POA: Insufficient documentation

## 2016-09-12 DIAGNOSIS — I5033 Acute on chronic diastolic (congestive) heart failure: Secondary | ICD-10-CM

## 2016-09-12 DIAGNOSIS — E785 Hyperlipidemia, unspecified: Secondary | ICD-10-CM | POA: Insufficient documentation

## 2016-09-12 DIAGNOSIS — R55 Syncope and collapse: Secondary | ICD-10-CM

## 2016-09-12 LAB — BASIC METABOLIC PANEL
ANION GAP: 15 (ref 5–15)
BUN: 22 mg/dL — AB (ref 6–20)
CHLORIDE: 76 mmol/L — AB (ref 101–111)
CO2: 42 mmol/L — ABNORMAL HIGH (ref 22–32)
Calcium: 9.1 mg/dL (ref 8.9–10.3)
Creatinine, Ser: 2.06 mg/dL — ABNORMAL HIGH (ref 0.44–1.00)
GFR calc Af Amer: 24 mL/min — ABNORMAL LOW (ref >60)
GFR, EST NON AFRICAN AMERICAN: 21 mL/min — AB (ref >60)
GLUCOSE: 173 mg/dL — AB (ref 65–99)
POTASSIUM: 3.1 mmol/L — AB (ref 3.5–5.1)
Sodium: 133 mmol/L — ABNORMAL LOW (ref 135–145)

## 2016-09-12 LAB — BRAIN NATRIURETIC PEPTIDE: B Natriuretic Peptide: 212 pg/mL — ABNORMAL HIGH (ref 0.0–100.0)

## 2016-09-12 MED ORDER — POTASSIUM CHLORIDE CRYS ER 20 MEQ PO TBCR: 20.0000 meq | EXTENDED_RELEASE_TABLET | Freq: Every day | ORAL | 6 refills | Status: DC

## 2016-09-12 NOTE — Patient Instructions (Signed)
Take your extra potassium on SATURDAYS with metolazone.  Hold metolazone and extra potassium on Saturday if you are orthostatic (dizzy upon standing).  Routine lab work today. Will notify you of abnormal results, otherwise no news is good news!  Follow up 4 weeks with Dr. Aundra Dubin.  Do the following things EVERYDAY: 1) Weigh yourself in the morning before breakfast. Write it down and keep it in a log. 2) Take your medicines as prescribed 3) Eat low salt foods-Limit salt (sodium) to 2000 mg per day.  4) Stay as active as you can everyday 5) Limit all fluids for the day to less than 2 liters

## 2016-09-12 NOTE — Progress Notes (Signed)
Patient ID: [name], female   DOB: [birth year], 82 y.o.   MRN: [id number]    Advanced Heart Failure Clinic Note   PCP: Dr. Doy Hutching Referring: Dr. Melvyn Novas Cardiology: Dr. Aundra Dubin  82 yo with chronic atrial fibrillation on coumadin, chronic diastolic CHF, and OHS presents for cardiology evaluation.  She has had several recent admissions.  In 6/16, she had syncope while sitting on the toilet.  She came to the ER.  Troponin was 0.21 and temperature was up to 101.  She was admitted and ended up having cardiac cath showing no obstructive CAD but 3+ MR.  Echo showed normal EF with grade III diastolic dysfunction, severe pulmonary hypertension, and per report only trivial MR.  She was re-admitted later in 6/16 after a fall and syncope again.  She was found to be profoundly hypoglycemic.  She is no longer on diabetes medications.    She was referred to Dr Melvyn Novas and has been on home oxygen for OHS.  She has been wheelchair-bound for the most part since 6/16.  She used to live alone prior to 6/16 but now lives with her daughter.  Admitted to [facility] 9/16 with marked volume overload. Diuresed with IV lasix and transitioned to torsemide 40 mg mg in am and 20 mg in pm. Had RHC with elevated filling pressures in a restrictive pattern and pulmonary venous htn. Discharge weight was 232 pounds.   Admitted for syncope 9/17-> 08/28/16. Extensive neurological work up negative. Xrays negative for acute fracture. Thought due to dehydration. Given IVF and chronic diuretic dosing decreased with concomitant AKI. EEG with mild to moderatediffuse slowing, absense of epileptiform.Discharge weight was 237 lbs.   She returns today for post hospital follow up. Currently in SNF at [facility]. Meals and meds provider to her. She had a recurrent episode earlier this week. Denies lightheadedness or dizziness. Denies CP. Denies DOE but on chronic oxygen and takes her time. Weight down from last visit.  Sleeps on three  pillows.    Labs (7/16): K 3.7, creatinine 0.93, BNP 370 Labs (9/16): hgb 8.4 Labs (10/16): K 4.3, creatinine 1.41 Labs (11/16): K 3.7, creatinine 1.49, BNP 413 Labs (1/17): K 4.1, creatinine 1.38, BNP 389 Labs (3/17): K 4.9, creatinine 1.45 Labs (5/17): K 4.5, creatinine 1.3, hgb 9.1, plts 145 Labs (7/17): K 5, creatinine 1.44  08/24/16 CT cervical spine out contrast: No acute abnormalities 08/24/16 CT head without contrast:No skull fracture and cranial hemorrhage 08/24/16 DGthoracic spine with swimmer's view:Osseous demineralization with scattered degenerative changes in the new mild superior endplate compression fracture of T6   PMH: 1. HTN 2. Type II diabetes: Currently on no meds.  3. Syncope 4. Chronic atrial fibrillation: No history of CVA. 11/16 event monitor showed chronic atrial fibrillation, no other abnormalities. 5. Chronic diastolic CHF: Echo (123XX123) with EF 55-60%, grade III diastolic dysfunction, PA systolic pressure 76 mmHg, trivial MR. RHC 08/09/2015 RA mean 29, RV 73/24, PA 74/33, mean 45,PCWP mean 33,Oxygen saturations:PA 49%AO 98% ,CO 5.12 /CI2.27, PVR 2.3 WU. 6. Cardiolite (2/16) with EF 48%, no ischemia.  LHC (6/16) with 20% distal LAD stenosis, EF 55-60%, 3+ MR.  7. Mitral regurgitation: Unclear how significant this is.  Trivial per 6/16 echo but 3+ per 6/16 cath.  8. H/o DVT 9. DCIS s/p lumpectomy and radiation in 2011.   10. OA 11. Hyperlipidemia 12. OHS: On home oxygen.  61. Morbid obesity   SH: Lives in [place] with family, never smoked.   FH: No  premature CAD. +CHF.   ROS: All systems reviewed and negative except as per HPI.   Current Outpatient Prescriptions  Medication Sig Dispense Refill  . acetaminophen (TYLENOL) 500 MG tablet Take 1,000 mg by mouth 2 (two) times daily as needed for mild pain.     Marland Kitchen atorvastatin (LIPITOR) 20 MG tablet TAKE 1 TABLET BY MOUTH EVERY DAY 90 tablet 3  . gabapentin (NEURONTIN) 300 MG capsule Take 300 mg by mouth at  bedtime.   3  . magnesium oxide (MAG-OX) 400 (241.3 Mg) MG tablet Take 1 tablet (400 mg total) by mouth daily. 30 tablet 0  . metolazone (ZAROXOLYN) 2.5 MG tablet Take one tablet Every Saturday 15 tablet 0  . metoprolol tartrate (LOPRESSOR) 25 MG tablet Take 0.5 tablets (12.5 mg total) by mouth 2 (two) times daily. 30 tablet 0  . metoprolol tartrate (LOPRESSOR) 25 MG tablet Take 0.5 tablets (12.5 mg total) by mouth 2 (two) times daily. 90 tablet 3  . pantoprazole (PROTONIX) 40 MG tablet Take 1 tablet (40 mg total) by mouth 2 (two) times daily before a meal. 60 tablet 3  . potassium chloride SA (KLOR-CON M20) 20 MEQ tablet Take 1 tablet (20 mEq total) by mouth daily. Take an extra 2 tabs on Wednesdays 100 tablet 6  . torsemide (DEMADEX) 20 MG tablet Take 2 tablets (40 mg total) by mouth daily. 30 tablet 0  . warfarin (COUMADIN) 2 MG tablet TAKE 1 TABLET BY MOUTH FOR 5 DAYS A WEEK, THEN 2 TABLETS ON THURSDAY AND SATURDAY (Patient taking differently: TAKE 1 TABLET (2 mg) BY MOUTH 6 DAYS A WEEK, THEN 2 TABLETS (4 mg) ON THURSDAY) 120 tablet 3   No current facility-administered medications for this encounter.    BP 108/70 (BP Location: Right Arm, Patient Position: Sitting, Cuff Size: Large)   Wt 236 lb (107 kg)   BMI 40.51 kg/m   Wt Readings from Last 3 Encounters:  09/12/16 236 lb (107 kg)  08/28/16 237 lb 6.4 oz (107.7 kg)  07/23/16 241 lb (109.3 kg)     General: NAD, obese Arrived in wheel chair . Daughter present .  Neck: JVP 7-8 cm, no thyromegaly or thyroid nodule.  Lungs: Clear on 3 liters.   CV: Nondisplaced PMI.  Heart irregular S1/S2, no S3/S4, 2/6 HSM at LLSB.  Trace ankle edema. No carotid bruit.  Normal pedal pulses.  Abdomen: Obese, soft, NT, ND, no HSM. No bruits or masses. +BS  Skin: Intact without lesions or rashes.  Neurologic: Alert and oriented x 3.  Psych: Normal affect. Extremities: No clubbing or cyanosis.  HEENT: Normal.   Assessment/Plan: 1. Chronic diastolic  CHF: Echo A999333 withLVEF 55-60%, RV mod reduced systolic function.  - NYHA class IIIb symptoms, though activity very limited.  - Volume status looks stable on exam despite decreased diuretics.  Has poor oral intake.  - Continue torsemide 40 mg daily.  Continue potassium 20 meq daily with extra 40 meq on metolazone days. BMET/BNP today  - Continue metolazone 2.5 mg Saturdays with extra 40 meq potassium.  2. HTN: Stable.   3. Pulmonary hypertension: Likely pulmonary venous hypertension that improved with diuresis. Improved with continuous oxygen.  4. Murmur: Moderate TR on echo.   5. Atrial fibrillation: Chronic. Continue rate control/anticoagulation strategy.  - Remains on warfarin and metoprolol with adequate rate control. No bleeding 6. CKD: Stage III.  - BMET today.  7. OHS: - On chronic home 02 3 L.   8. Morbid obesity:  -  Encouraged continued work with PT at [facility].   Follow up 4 weeks with Dr. Aundra Dubin. Keep meds the same for now.  Daily weights, will have sent to [place] every 2 weeks. Hold metolazone as needed. BMET/BNP today.   Shirley Friar, PA-C 09/12/2016  Total time spent > 25 minutes. Over half that spent discussing the above.

## 2016-09-24 ENCOUNTER — Telehealth: Payer: Self-pay | Admitting: Cardiology

## 2016-09-24 ENCOUNTER — Encounter (HOSPITAL_COMMUNITY): Payer: Commercial Managed Care - HMO

## 2016-09-24 NOTE — Telephone Encounter (Signed)
This appears to be a HF patient. Routing to H. Schub and M. Leory Plowman.

## 2016-09-24 NOTE — Telephone Encounter (Signed)
Attempted to return call with no answer

## 2016-09-24 NOTE — Telephone Encounter (Signed)
[name] wants to know if Dr.Mclean would be able to sign off on the patients home care

## 2016-09-25 DIAGNOSIS — E1122 Type 2 diabetes mellitus with diabetic chronic kidney disease: Secondary | ICD-10-CM | POA: Diagnosis not present

## 2016-09-25 DIAGNOSIS — S22050D Wedge compression fracture of T5-T6 vertebra, subsequent encounter for fracture with routine healing: Secondary | ICD-10-CM | POA: Diagnosis not present

## 2016-09-25 DIAGNOSIS — I130 Hypertensive heart and chronic kidney disease with heart failure and stage 1 through stage 4 chronic kidney disease, or unspecified chronic kidney disease: Secondary | ICD-10-CM | POA: Diagnosis not present

## 2016-09-25 DIAGNOSIS — I2729 Other secondary pulmonary hypertension: Secondary | ICD-10-CM | POA: Diagnosis not present

## 2016-09-25 DIAGNOSIS — E1165 Type 2 diabetes mellitus with hyperglycemia: Secondary | ICD-10-CM | POA: Diagnosis not present

## 2016-09-25 DIAGNOSIS — N184 Chronic kidney disease, stage 4 (severe): Secondary | ICD-10-CM | POA: Diagnosis not present

## 2016-09-25 DIAGNOSIS — I4891 Unspecified atrial fibrillation: Secondary | ICD-10-CM | POA: Diagnosis not present

## 2016-09-25 DIAGNOSIS — I340 Nonrheumatic mitral (valve) insufficiency: Secondary | ICD-10-CM | POA: Diagnosis not present

## 2016-09-25 DIAGNOSIS — I5032 Chronic diastolic (congestive) heart failure: Secondary | ICD-10-CM | POA: Diagnosis not present

## 2016-09-26 DIAGNOSIS — I5032 Chronic diastolic (congestive) heart failure: Secondary | ICD-10-CM | POA: Diagnosis not present

## 2016-09-26 DIAGNOSIS — N184 Chronic kidney disease, stage 4 (severe): Secondary | ICD-10-CM | POA: Diagnosis not present

## 2016-09-26 DIAGNOSIS — I340 Nonrheumatic mitral (valve) insufficiency: Secondary | ICD-10-CM | POA: Diagnosis not present

## 2016-09-26 DIAGNOSIS — S22050D Wedge compression fracture of T5-T6 vertebra, subsequent encounter for fracture with routine healing: Secondary | ICD-10-CM | POA: Diagnosis not present

## 2016-09-26 DIAGNOSIS — I4891 Unspecified atrial fibrillation: Secondary | ICD-10-CM | POA: Diagnosis not present

## 2016-09-26 DIAGNOSIS — I2729 Other secondary pulmonary hypertension: Secondary | ICD-10-CM | POA: Diagnosis not present

## 2016-09-26 DIAGNOSIS — E1165 Type 2 diabetes mellitus with hyperglycemia: Secondary | ICD-10-CM | POA: Diagnosis not present

## 2016-09-26 DIAGNOSIS — I130 Hypertensive heart and chronic kidney disease with heart failure and stage 1 through stage 4 chronic kidney disease, or unspecified chronic kidney disease: Secondary | ICD-10-CM | POA: Diagnosis not present

## 2016-09-26 DIAGNOSIS — E1122 Type 2 diabetes mellitus with diabetic chronic kidney disease: Secondary | ICD-10-CM | POA: Diagnosis not present

## 2016-09-29 DIAGNOSIS — E1142 Type 2 diabetes mellitus with diabetic polyneuropathy: Secondary | ICD-10-CM | POA: Diagnosis not present

## 2016-09-29 DIAGNOSIS — I1310 Hypertensive heart and chronic kidney disease without heart failure, with stage 1 through stage 4 chronic kidney disease, or unspecified chronic kidney disease: Secondary | ICD-10-CM | POA: Diagnosis not present

## 2016-09-29 DIAGNOSIS — Z79899 Other long term (current) drug therapy: Secondary | ICD-10-CM | POA: Diagnosis not present

## 2016-09-29 DIAGNOSIS — N08 Glomerular disorders in diseases classified elsewhere: Secondary | ICD-10-CM | POA: Diagnosis not present

## 2016-09-29 DIAGNOSIS — E1121 Type 2 diabetes mellitus with diabetic nephropathy: Secondary | ICD-10-CM | POA: Diagnosis not present

## 2016-09-29 DIAGNOSIS — R55 Syncope and collapse: Secondary | ICD-10-CM | POA: Diagnosis not present

## 2016-09-29 DIAGNOSIS — N183 Chronic kidney disease, stage 3 (moderate): Secondary | ICD-10-CM | POA: Diagnosis not present

## 2016-10-12 DIAGNOSIS — K264 Chronic or unspecified duodenal ulcer with hemorrhage: Secondary | ICD-10-CM | POA: Diagnosis not present

## 2016-10-12 DIAGNOSIS — K922 Gastrointestinal hemorrhage, unspecified: Secondary | ICD-10-CM | POA: Diagnosis not present

## 2016-10-12 DIAGNOSIS — I5043 Acute on chronic combined systolic (congestive) and diastolic (congestive) heart failure: Secondary | ICD-10-CM | POA: Diagnosis not present

## 2016-10-12 DIAGNOSIS — I10 Essential (primary) hypertension: Secondary | ICD-10-CM | POA: Diagnosis not present

## 2016-10-12 DIAGNOSIS — I4891 Unspecified atrial fibrillation: Secondary | ICD-10-CM | POA: Diagnosis not present

## 2016-10-13 ENCOUNTER — Encounter (HOSPITAL_COMMUNITY): Payer: Commercial Managed Care - HMO

## 2016-10-13 DIAGNOSIS — I130 Hypertensive heart and chronic kidney disease with heart failure and stage 1 through stage 4 chronic kidney disease, or unspecified chronic kidney disease: Secondary | ICD-10-CM | POA: Diagnosis not present

## 2016-10-13 DIAGNOSIS — S22050D Wedge compression fracture of T5-T6 vertebra, subsequent encounter for fracture with routine healing: Secondary | ICD-10-CM | POA: Diagnosis not present

## 2016-10-13 DIAGNOSIS — E1165 Type 2 diabetes mellitus with hyperglycemia: Secondary | ICD-10-CM | POA: Diagnosis not present

## 2016-10-13 DIAGNOSIS — I4891 Unspecified atrial fibrillation: Secondary | ICD-10-CM | POA: Diagnosis not present

## 2016-10-13 DIAGNOSIS — I340 Nonrheumatic mitral (valve) insufficiency: Secondary | ICD-10-CM | POA: Diagnosis not present

## 2016-10-13 DIAGNOSIS — E1122 Type 2 diabetes mellitus with diabetic chronic kidney disease: Secondary | ICD-10-CM | POA: Diagnosis not present

## 2016-10-13 DIAGNOSIS — N184 Chronic kidney disease, stage 4 (severe): Secondary | ICD-10-CM | POA: Diagnosis not present

## 2016-10-13 DIAGNOSIS — I2729 Other secondary pulmonary hypertension: Secondary | ICD-10-CM | POA: Diagnosis not present

## 2016-10-13 DIAGNOSIS — I5032 Chronic diastolic (congestive) heart failure: Secondary | ICD-10-CM | POA: Diagnosis not present

## 2016-10-14 DIAGNOSIS — I5032 Chronic diastolic (congestive) heart failure: Secondary | ICD-10-CM | POA: Diagnosis not present

## 2016-10-14 DIAGNOSIS — S22050D Wedge compression fracture of T5-T6 vertebra, subsequent encounter for fracture with routine healing: Secondary | ICD-10-CM | POA: Diagnosis not present

## 2016-10-14 DIAGNOSIS — E1122 Type 2 diabetes mellitus with diabetic chronic kidney disease: Secondary | ICD-10-CM | POA: Diagnosis not present

## 2016-10-14 DIAGNOSIS — E1165 Type 2 diabetes mellitus with hyperglycemia: Secondary | ICD-10-CM | POA: Diagnosis not present

## 2016-10-14 DIAGNOSIS — I130 Hypertensive heart and chronic kidney disease with heart failure and stage 1 through stage 4 chronic kidney disease, or unspecified chronic kidney disease: Secondary | ICD-10-CM | POA: Diagnosis not present

## 2016-10-14 DIAGNOSIS — I2729 Other secondary pulmonary hypertension: Secondary | ICD-10-CM | POA: Diagnosis not present

## 2016-10-14 DIAGNOSIS — I4891 Unspecified atrial fibrillation: Secondary | ICD-10-CM | POA: Diagnosis not present

## 2016-10-14 DIAGNOSIS — N184 Chronic kidney disease, stage 4 (severe): Secondary | ICD-10-CM | POA: Diagnosis not present

## 2016-10-14 DIAGNOSIS — I340 Nonrheumatic mitral (valve) insufficiency: Secondary | ICD-10-CM | POA: Diagnosis not present

## 2016-10-15 DIAGNOSIS — E1165 Type 2 diabetes mellitus with hyperglycemia: Secondary | ICD-10-CM | POA: Diagnosis not present

## 2016-10-15 DIAGNOSIS — I130 Hypertensive heart and chronic kidney disease with heart failure and stage 1 through stage 4 chronic kidney disease, or unspecified chronic kidney disease: Secondary | ICD-10-CM | POA: Diagnosis not present

## 2016-10-15 DIAGNOSIS — I4891 Unspecified atrial fibrillation: Secondary | ICD-10-CM | POA: Diagnosis not present

## 2016-10-15 DIAGNOSIS — I2729 Other secondary pulmonary hypertension: Secondary | ICD-10-CM | POA: Diagnosis not present

## 2016-10-15 DIAGNOSIS — I340 Nonrheumatic mitral (valve) insufficiency: Secondary | ICD-10-CM | POA: Diagnosis not present

## 2016-10-15 DIAGNOSIS — I5032 Chronic diastolic (congestive) heart failure: Secondary | ICD-10-CM | POA: Diagnosis not present

## 2016-10-15 DIAGNOSIS — S22050D Wedge compression fracture of T5-T6 vertebra, subsequent encounter for fracture with routine healing: Secondary | ICD-10-CM | POA: Diagnosis not present

## 2016-10-15 DIAGNOSIS — E1122 Type 2 diabetes mellitus with diabetic chronic kidney disease: Secondary | ICD-10-CM | POA: Diagnosis not present

## 2016-10-15 DIAGNOSIS — N184 Chronic kidney disease, stage 4 (severe): Secondary | ICD-10-CM | POA: Diagnosis not present

## 2016-10-16 DIAGNOSIS — N184 Chronic kidney disease, stage 4 (severe): Secondary | ICD-10-CM | POA: Diagnosis not present

## 2016-10-16 DIAGNOSIS — I4891 Unspecified atrial fibrillation: Secondary | ICD-10-CM | POA: Diagnosis not present

## 2016-10-16 DIAGNOSIS — I2729 Other secondary pulmonary hypertension: Secondary | ICD-10-CM | POA: Diagnosis not present

## 2016-10-16 DIAGNOSIS — I340 Nonrheumatic mitral (valve) insufficiency: Secondary | ICD-10-CM | POA: Diagnosis not present

## 2016-10-16 DIAGNOSIS — E1122 Type 2 diabetes mellitus with diabetic chronic kidney disease: Secondary | ICD-10-CM | POA: Diagnosis not present

## 2016-10-16 DIAGNOSIS — S22050D Wedge compression fracture of T5-T6 vertebra, subsequent encounter for fracture with routine healing: Secondary | ICD-10-CM | POA: Diagnosis not present

## 2016-10-16 DIAGNOSIS — E1165 Type 2 diabetes mellitus with hyperglycemia: Secondary | ICD-10-CM | POA: Diagnosis not present

## 2016-10-16 DIAGNOSIS — I5032 Chronic diastolic (congestive) heart failure: Secondary | ICD-10-CM | POA: Diagnosis not present

## 2016-10-16 DIAGNOSIS — I130 Hypertensive heart and chronic kidney disease with heart failure and stage 1 through stage 4 chronic kidney disease, or unspecified chronic kidney disease: Secondary | ICD-10-CM | POA: Diagnosis not present

## 2016-10-17 ENCOUNTER — Ambulatory Visit (HOSPITAL_COMMUNITY)
Admission: RE | Admit: 2016-10-17 | Discharge: 2016-10-17 | Disposition: A | Discharge status: Home / Self Care | Payer: Commercial Managed Care - HMO | Source: Ambulatory Visit | Attending: Cardiology | Admitting: Cardiology

## 2016-10-17 VITALS — BP 98/62 | HR 102 | Wt 248.0 lb

## 2016-10-17 DIAGNOSIS — I5032 Chronic diastolic (congestive) heart failure: Secondary | ICD-10-CM | POA: Diagnosis not present

## 2016-10-17 DIAGNOSIS — N183 Chronic kidney disease, stage 3 (moderate): Secondary | ICD-10-CM | POA: Diagnosis not present

## 2016-10-17 DIAGNOSIS — Z7901 Long term (current) use of anticoagulants: Secondary | ICD-10-CM | POA: Insufficient documentation

## 2016-10-17 DIAGNOSIS — E1165 Type 2 diabetes mellitus with hyperglycemia: Secondary | ICD-10-CM | POA: Diagnosis not present

## 2016-10-17 DIAGNOSIS — E1122 Type 2 diabetes mellitus with diabetic chronic kidney disease: Secondary | ICD-10-CM | POA: Diagnosis not present

## 2016-10-17 DIAGNOSIS — S22050D Wedge compression fracture of T5-T6 vertebra, subsequent encounter for fracture with routine healing: Secondary | ICD-10-CM | POA: Diagnosis not present

## 2016-10-17 DIAGNOSIS — I2510 Atherosclerotic heart disease of native coronary artery without angina pectoris: Secondary | ICD-10-CM | POA: Diagnosis not present

## 2016-10-17 DIAGNOSIS — R011 Cardiac murmur, unspecified: Secondary | ICD-10-CM | POA: Insufficient documentation

## 2016-10-17 DIAGNOSIS — I482 Chronic atrial fibrillation, unspecified: Secondary | ICD-10-CM

## 2016-10-17 DIAGNOSIS — I130 Hypertensive heart and chronic kidney disease with heart failure and stage 1 through stage 4 chronic kidney disease, or unspecified chronic kidney disease: Secondary | ICD-10-CM | POA: Insufficient documentation

## 2016-10-17 DIAGNOSIS — Z86718 Personal history of other venous thrombosis and embolism: Secondary | ICD-10-CM | POA: Diagnosis not present

## 2016-10-17 DIAGNOSIS — E785 Hyperlipidemia, unspecified: Secondary | ICD-10-CM | POA: Diagnosis not present

## 2016-10-17 DIAGNOSIS — Z6841 Body Mass Index (BMI) 40.0 and over, adult: Secondary | ICD-10-CM | POA: Diagnosis not present

## 2016-10-17 DIAGNOSIS — M1990 Unspecified osteoarthritis, unspecified site: Secondary | ICD-10-CM | POA: Diagnosis not present

## 2016-10-17 DIAGNOSIS — I340 Nonrheumatic mitral (valve) insufficiency: Secondary | ICD-10-CM | POA: Insufficient documentation

## 2016-10-17 DIAGNOSIS — I5033 Acute on chronic diastolic (congestive) heart failure: Secondary | ICD-10-CM

## 2016-10-17 DIAGNOSIS — I272 Pulmonary hypertension, unspecified: Secondary | ICD-10-CM | POA: Insufficient documentation

## 2016-10-17 DIAGNOSIS — Z9981 Dependence on supplemental oxygen: Secondary | ICD-10-CM | POA: Insufficient documentation

## 2016-10-17 DIAGNOSIS — I4891 Unspecified atrial fibrillation: Secondary | ICD-10-CM | POA: Diagnosis not present

## 2016-10-17 DIAGNOSIS — N184 Chronic kidney disease, stage 4 (severe): Secondary | ICD-10-CM | POA: Diagnosis not present

## 2016-10-17 DIAGNOSIS — I2729 Other secondary pulmonary hypertension: Secondary | ICD-10-CM | POA: Diagnosis not present

## 2016-10-17 LAB — BASIC METABOLIC PANEL
Anion gap: 14 (ref 5–15)
BUN: 25 mg/dL — ABNORMAL HIGH (ref 6–20)
CALCIUM: 8.9 mg/dL (ref 8.9–10.3)
CO2: 35 mmol/L — AB (ref 22–32)
CREATININE: 1.49 mg/dL — AB (ref 0.44–1.00)
Chloride: 88 mmol/L — ABNORMAL LOW (ref 101–111)
GFR calc non Af Amer: 30 mL/min — ABNORMAL LOW (ref >60)
GFR, EST AFRICAN AMERICAN: 35 mL/min — AB (ref >60)
Glucose, Bld: 180 mg/dL — ABNORMAL HIGH (ref 65–99)
Potassium: 3.4 mmol/L — ABNORMAL LOW (ref 3.5–5.1)
SODIUM: 137 mmol/L (ref 135–145)

## 2016-10-17 LAB — CBC
HCT: 27.7 % — ABNORMAL LOW (ref 36.0–46.0)
Hemoglobin: 8.4 g/dL — ABNORMAL LOW (ref 12.0–15.0)
MCH: 30 pg (ref 26.0–34.0)
MCHC: 30.3 g/dL (ref 30.0–36.0)
MCV: 98.9 fL (ref 78.0–100.0)
PLATELETS: 185 10*3/uL (ref 150–400)
RBC: 2.8 MIL/uL — ABNORMAL LOW (ref 3.87–5.11)
RDW: 14.6 % (ref 11.5–15.5)
WBC: 6.7 10*3/uL (ref 4.0–10.5)

## 2016-10-17 LAB — BRAIN NATRIURETIC PEPTIDE: B Natriuretic Peptide: 282.1 pg/mL — ABNORMAL HIGH (ref 0.0–100.0)

## 2016-10-17 MED ORDER — TORSEMIDE 20 MG PO TABS: ORAL_TABLET | ORAL | 3 refills | Status: DC

## 2016-10-17 MED ORDER — POTASSIUM CHLORIDE CRYS ER 20 MEQ PO TBCR: 40.0000 meq | EXTENDED_RELEASE_TABLET | Freq: Two times a day (BID) | ORAL | 3 refills | Status: DC

## 2016-10-17 NOTE — Progress Notes (Signed)
Advanced Heart Failure Medication Review by a Pharmacist  Does the patient  feel that his/her medications are working for him/her?  no  Has the patient been experiencing any side effects to the medications prescribed?  no  Does the patient measure his/her own blood pressure or blood glucose at home?  no   Does the patient have any problems obtaining medications due to transportation or finances?   no  Understanding of regimen: poor Understanding of indications: poor Potential of compliance: good Patient understands to avoid NSAIDs. Patient understands to avoid decongestants.  Issues to address at subsequent visits: None   Pharmacist comments:  [name] is a pleasant 82 yo F presenting with her granddaughter and her medication bottles but her daughter manages her medications. She reports good compliance with her regimen but does feel like she has been retaining fluid since her dose of torsemide was reduced.   [name]. [name], PharmD, BCPS, CPP Clinical Pharmacist Pager: [phone] Phone: [phone] 10/17/2016 11:24 AM      Time with patient: 20 minutes Preparation and documentation time: 2 minutes Total time: 22 minutes

## 2016-10-17 NOTE — Patient Instructions (Signed)
INCREASE Torsemide to 80 mg (4 tabs) in am and 60 mg (3 tabs) in pm.  INCREASE Potassium to 40 meq (2 tabs) twice daily.  Routine lab work today. Will notify you of abnormal results, otherwise no news is good news!  Follow up with Oda Kilts PA-C in 1 week.  Do the following things EVERYDAY: 1) Weigh yourself in the morning before breakfast. Write it down and keep it in a log. 2) Take your medicines as prescribed 3) Eat low salt foods-Limit salt (sodium) to 2000 mg per day.  4) Stay as active as you can everyday 5) Limit all fluids for the day to less than 2 liters

## 2016-10-18 NOTE — Progress Notes (Signed)
Patient ID: [name], female   DOB: May 30, 1926, 82 y.o.   MRN: [id number]    Advanced Heart Failure Clinic Note   PCP: Dr. Doy Hutching Referring: Dr. Melvyn Novas Cardiology: Dr. Aundra Dubin  82 yo with chronic atrial fibrillation on coumadin, chronic diastolic CHF, and OHS presents for cardiology evaluation.  She has had several recent admissions.  In 6/16, she had syncope while sitting on the toilet.  She came to the ER.  Troponin was 0.21 and temperature was up to 101.  She was admitted and ended up having cardiac cath showing no obstructive CAD but 3+ MR.  Echo showed normal EF with grade III diastolic dysfunction, severe pulmonary hypertension, and per report only trivial MR.  She was re-admitted later in 6/16 after a fall and syncope again.  She was found to be profoundly hypoglycemic.  She is no longer on diabetes medications.    She was referred to Dr Melvyn Novas and has been on home oxygen for OHS.  She has been wheelchair-bound for the most part since 6/16.  She used to live alone prior to 6/16 but now lives with her daughter.  Admitted to [facility] 9/16 with marked volume overload. Diuresed with IV lasix and transitioned to torsemide 40 mg mg in am and 20 mg in pm. Had RHC with elevated filling pressures in a restrictive pattern and pulmonary venous htn. Discharge weight was 232 pounds.   Admitted for syncope 9/17-> 08/28/16. Extensive neurological work up negative. Xrays negative for acute fracture. Thought due to dehydration. Given IVF and chronic diuretic dosing decreased with concomitant AKI. EEG with mild to moderatediffuse slowing, absense of epileptiform.Discharge weight was 237 lbs.   She is now back at home from SNF.  Weight is now up a lot on lower dose of diuretics, 12 lbs since last appointment.  She is short of breath walking around her house.  Chronic orthopnea.  No BRBPR or melena.  Wears oxygen at all times.    Labs (7/16): K 3.7, creatinine 0.93, BNP 370 Labs (9/16): hgb 8.4 Labs (10/16): K  4.3, creatinine 1.41 Labs (11/16): K 3.7, creatinine 1.49, BNP 413 Labs (1/17): K 4.1, creatinine 1.38, BNP 389 Labs (3/17): K 4.9, creatinine 1.45 Labs (5/17): K 4.5, creatinine 1.3, hgb 9.1, plts 145 Labs (7/17): K 5, creatinine 1.44 Labs (10/17): K 3.1, creatinine 2.06, BNP 212  PMH: 1. HTN 2. Type II diabetes: Currently on no meds.  3. Syncope 4. Chronic atrial fibrillation: No history of CVA. 11/16 event monitor showed chronic atrial fibrillation, no other abnormalities. 5. Chronic diastolic CHF: Echo (123XX123) with EF 55-60%, grade III diastolic dysfunction, PA systolic pressure 76 mmHg, trivial MR. RHC 08/09/2015 RA mean 29, RV 73/24, PA 74/33, mean 45,PCWP mean 33,Oxygen saturations:PA 49%AO 98% ,CO 5.12 /CI2.27, PVR 2.3 WU. 6. Cardiolite (2/16) with EF 48%, no ischemia.  LHC (6/16) with 20% distal LAD stenosis, EF 55-60%, 3+ MR.  7. Mitral regurgitation: Unclear how significant this is.  Trivial per 6/16 echo but 3+ per 6/16 cath.  8. H/o DVT 9. DCIS s/p lumpectomy and radiation in 2011.   10. OA 11. Hyperlipidemia 12. OHS: On home oxygen.  13. Morbid obesity 14. Syncope 9/17   SH: Lives in [place] with family, never smoked.   FH: No premature CAD. +CHF.   ROS: All systems reviewed and negative except as per HPI.   Current Outpatient Prescriptions  Medication Sig Dispense Refill  . acetaminophen (TYLENOL) 500 MG tablet Take 1,000 mg by mouth 2 (  two) times daily as needed for mild pain.     Marland Kitchen atorvastatin (LIPITOR) 20 MG tablet TAKE 1 TABLET BY MOUTH EVERY DAY 90 tablet 3  . gabapentin (NEURONTIN) 300 MG capsule Take 300 mg by mouth at bedtime.   3  . magnesium oxide (MAG-OX) 400 (241.3 Mg) MG tablet Take 1 tablet (400 mg total) by mouth daily. 30 tablet 0  . metoprolol tartrate (LOPRESSOR) 25 MG tablet Take 0.5 tablets (12.5 mg total) by mouth 2 (two) times daily. 30 tablet 0  . pantoprazole (PROTONIX) 40 MG tablet Take 1 tablet (40 mg total) by mouth 2 (two) times daily  before a meal. 60 tablet 3  . potassium chloride SA (K-DUR,KLOR-CON) 20 MEQ tablet Take 2 tablets (40 mEq total) by mouth 2 (two) times daily. 120 tablet 3  . torsemide (DEMADEX) 20 MG tablet Take 80 mg (4 tabs) in am and 60 mg (3 tabs) in pm 210 tablet 3  . warfarin (COUMADIN) 2 MG tablet Take 3 mg by mouth daily.      No current facility-administered medications for this encounter.    BP 98/62 (BP Location: Left Wrist, Patient Position: Sitting, Cuff Size: Normal)   Pulse (!) 102   Wt 248 lb (112.5 kg)   SpO2 100% Comment: on 3L  BMI 42.57 kg/m   Wt Readings from Last 3 Encounters:  10/17/16 248 lb (112.5 kg)  09/12/16 236 lb (107 kg)  08/28/16 237 lb 6.4 oz (107.7 kg)     General: NAD, obese Arrived in wheel chair . Daughter present .  Neck: JVP 9-10 cm, no thyromegaly or thyroid nodule.  Lungs: Clear on 3 liters.   CV: Nondisplaced PMI.  Heart irregular S1/S2, no S3/S4, 2/6 HSM at LLSB.  1+ ankle edema. No carotid bruit.  Normal pedal pulses.  Abdomen: Obese, soft, NT, ND, no HSM. No bruits or masses. +BS  Skin: Intact without lesions or rashes.  Neurologic: Alert and oriented x 3.  Psych: Normal affect. Extremities: No clubbing or cyanosis.  HEENT: Normal.   Assessment/Plan: 1. Chronic diastolic CHF: Echo A999333 with LVEF 55-60%, RV mod reduced systolic function.  NYHA class IIIb symptoms, though activity very limited.  She is volume overloaded today on decreased diuretics.  - Increase torsemide back to 80 qam/60 qpm.  Increase KCl to 40 bid.  - For now, will not have her use metolazone regularly.  - BMET/BNP today, and repeat BMET in 1 year.   2. HTN: Stable.   3. Pulmonary hypertension: Likely pulmonary venous hypertension that improved with diuresis. Improved with continuous oxygen.  4. Murmur: Moderate TR on echo.   5. Atrial fibrillation: Chronic. Continue rate control/anticoagulation strategy.  - Remains on warfarin and metoprolol with adequate rate control. No  bleeding 6. CKD: Stage III.  - BMET today.  7. OHS: - On chronic home 02 3 L.     Followup next week.   Loralie Champagne 10/18/2016

## 2016-10-20 ENCOUNTER — Telehealth (HOSPITAL_COMMUNITY): Payer: Self-pay | Admitting: *Deleted

## 2016-10-20 DIAGNOSIS — I5032 Chronic diastolic (congestive) heart failure: Secondary | ICD-10-CM | POA: Diagnosis not present

## 2016-10-20 DIAGNOSIS — E1165 Type 2 diabetes mellitus with hyperglycemia: Secondary | ICD-10-CM | POA: Diagnosis not present

## 2016-10-20 DIAGNOSIS — E1122 Type 2 diabetes mellitus with diabetic chronic kidney disease: Secondary | ICD-10-CM | POA: Diagnosis not present

## 2016-10-20 DIAGNOSIS — S22050D Wedge compression fracture of T5-T6 vertebra, subsequent encounter for fracture with routine healing: Secondary | ICD-10-CM | POA: Diagnosis not present

## 2016-10-20 DIAGNOSIS — I2729 Other secondary pulmonary hypertension: Secondary | ICD-10-CM | POA: Diagnosis not present

## 2016-10-20 DIAGNOSIS — I340 Nonrheumatic mitral (valve) insufficiency: Secondary | ICD-10-CM | POA: Diagnosis not present

## 2016-10-20 DIAGNOSIS — I130 Hypertensive heart and chronic kidney disease with heart failure and stage 1 through stage 4 chronic kidney disease, or unspecified chronic kidney disease: Secondary | ICD-10-CM | POA: Diagnosis not present

## 2016-10-20 DIAGNOSIS — I4891 Unspecified atrial fibrillation: Secondary | ICD-10-CM | POA: Diagnosis not present

## 2016-10-20 DIAGNOSIS — N184 Chronic kidney disease, stage 4 (severe): Secondary | ICD-10-CM | POA: Diagnosis not present

## 2016-10-20 MED ORDER — POTASSIUM CHLORIDE CRYS ER 20 MEQ PO TBCR: EXTENDED_RELEASE_TABLET | ORAL | 3 refills | Status: DC

## 2016-10-20 NOTE — Telephone Encounter (Signed)
Notes Recorded by Harvie Junior, CMA on 10/20/2016 at 2:31 PM EST Patient aware. Spoke with patients daughter she is aware of lab results and medication changes.   ------  Notes Recorded by [name], MD on 10/19/2016 at 12:02 PM EST Creatinine better. Add an addition 20 mEq KCl to her daily regimen.    Ref Range & Units 3d ago (10/17/16) 10mo ago (09/12/16) 25mo ago (08/28/16)   Sodium 135 - 145 mmol/L 137  133   135    Potassium 3.5 - 5.1 mmol/L 3.4   3.1   2.8CM     Chloride 101 - 111 mmol/L 88   76   85     CO2 22 - 32 mmol/L 35   42   42     Glucose, Bld 65 - 99 mg/dL 180   173   170     BUN 6 - 20 mg/dL 25   22   30      Creatinine, Ser 0.44 - 1.00 mg/dL 1.49   2.06   1.32     Calcium 8.9 - 10.3 mg/dL 8.9  9.1  8.6     GFR calc non Af Amer >60 mL/min 30   21   35     GFR calc Af Amer >60 mL/min 35   24CM   41CM

## 2016-10-20 NOTE — Telephone Encounter (Signed)
-----   Message from [name], MD sent at 10/19/2016 12:02 PM EST ----- Creatinine better.  Add an addition 20 mEq KCl to her daily regimen.

## 2016-10-21 DIAGNOSIS — E1165 Type 2 diabetes mellitus with hyperglycemia: Secondary | ICD-10-CM | POA: Diagnosis not present

## 2016-10-21 DIAGNOSIS — I4891 Unspecified atrial fibrillation: Secondary | ICD-10-CM | POA: Diagnosis not present

## 2016-10-21 DIAGNOSIS — E1122 Type 2 diabetes mellitus with diabetic chronic kidney disease: Secondary | ICD-10-CM | POA: Diagnosis not present

## 2016-10-21 DIAGNOSIS — I5032 Chronic diastolic (congestive) heart failure: Secondary | ICD-10-CM | POA: Diagnosis not present

## 2016-10-21 DIAGNOSIS — I2729 Other secondary pulmonary hypertension: Secondary | ICD-10-CM | POA: Diagnosis not present

## 2016-10-21 DIAGNOSIS — S22050D Wedge compression fracture of T5-T6 vertebra, subsequent encounter for fracture with routine healing: Secondary | ICD-10-CM | POA: Diagnosis not present

## 2016-10-21 DIAGNOSIS — N184 Chronic kidney disease, stage 4 (severe): Secondary | ICD-10-CM | POA: Diagnosis not present

## 2016-10-21 DIAGNOSIS — I130 Hypertensive heart and chronic kidney disease with heart failure and stage 1 through stage 4 chronic kidney disease, or unspecified chronic kidney disease: Secondary | ICD-10-CM | POA: Diagnosis not present

## 2016-10-21 DIAGNOSIS — I340 Nonrheumatic mitral (valve) insufficiency: Secondary | ICD-10-CM | POA: Diagnosis not present

## 2016-10-22 DIAGNOSIS — I5032 Chronic diastolic (congestive) heart failure: Secondary | ICD-10-CM | POA: Diagnosis not present

## 2016-10-22 DIAGNOSIS — I2729 Other secondary pulmonary hypertension: Secondary | ICD-10-CM | POA: Diagnosis not present

## 2016-10-22 DIAGNOSIS — E1165 Type 2 diabetes mellitus with hyperglycemia: Secondary | ICD-10-CM | POA: Diagnosis not present

## 2016-10-22 DIAGNOSIS — I4891 Unspecified atrial fibrillation: Secondary | ICD-10-CM | POA: Diagnosis not present

## 2016-10-22 DIAGNOSIS — S22050D Wedge compression fracture of T5-T6 vertebra, subsequent encounter for fracture with routine healing: Secondary | ICD-10-CM | POA: Diagnosis not present

## 2016-10-22 DIAGNOSIS — I130 Hypertensive heart and chronic kidney disease with heart failure and stage 1 through stage 4 chronic kidney disease, or unspecified chronic kidney disease: Secondary | ICD-10-CM | POA: Diagnosis not present

## 2016-10-22 DIAGNOSIS — N184 Chronic kidney disease, stage 4 (severe): Secondary | ICD-10-CM | POA: Diagnosis not present

## 2016-10-22 DIAGNOSIS — I340 Nonrheumatic mitral (valve) insufficiency: Secondary | ICD-10-CM | POA: Diagnosis not present

## 2016-10-22 DIAGNOSIS — E1122 Type 2 diabetes mellitus with diabetic chronic kidney disease: Secondary | ICD-10-CM | POA: Diagnosis not present

## 2016-10-23 DIAGNOSIS — I2729 Other secondary pulmonary hypertension: Secondary | ICD-10-CM | POA: Diagnosis not present

## 2016-10-23 DIAGNOSIS — E1165 Type 2 diabetes mellitus with hyperglycemia: Secondary | ICD-10-CM | POA: Diagnosis not present

## 2016-10-23 DIAGNOSIS — N184 Chronic kidney disease, stage 4 (severe): Secondary | ICD-10-CM | POA: Diagnosis not present

## 2016-10-23 DIAGNOSIS — I130 Hypertensive heart and chronic kidney disease with heart failure and stage 1 through stage 4 chronic kidney disease, or unspecified chronic kidney disease: Secondary | ICD-10-CM | POA: Diagnosis not present

## 2016-10-23 DIAGNOSIS — I4891 Unspecified atrial fibrillation: Secondary | ICD-10-CM | POA: Diagnosis not present

## 2016-10-23 DIAGNOSIS — S22050D Wedge compression fracture of T5-T6 vertebra, subsequent encounter for fracture with routine healing: Secondary | ICD-10-CM | POA: Diagnosis not present

## 2016-10-23 DIAGNOSIS — I340 Nonrheumatic mitral (valve) insufficiency: Secondary | ICD-10-CM | POA: Diagnosis not present

## 2016-10-23 DIAGNOSIS — E1122 Type 2 diabetes mellitus with diabetic chronic kidney disease: Secondary | ICD-10-CM | POA: Diagnosis not present

## 2016-10-23 DIAGNOSIS — I5032 Chronic diastolic (congestive) heart failure: Secondary | ICD-10-CM | POA: Diagnosis not present

## 2016-10-24 ENCOUNTER — Ambulatory Visit (HOSPITAL_COMMUNITY)
Admission: RE | Admit: 2016-10-24 | Discharge: 2016-10-24 | Disposition: A | Discharge status: Home / Self Care | Payer: Commercial Managed Care - HMO | Source: Ambulatory Visit | Attending: Cardiology | Admitting: Cardiology

## 2016-10-24 VITALS — BP 144/78 | HR 112 | Wt 242.2 lb

## 2016-10-24 DIAGNOSIS — I340 Nonrheumatic mitral (valve) insufficiency: Secondary | ICD-10-CM | POA: Diagnosis not present

## 2016-10-24 DIAGNOSIS — E785 Hyperlipidemia, unspecified: Secondary | ICD-10-CM | POA: Diagnosis not present

## 2016-10-24 DIAGNOSIS — E1122 Type 2 diabetes mellitus with diabetic chronic kidney disease: Secondary | ICD-10-CM | POA: Diagnosis not present

## 2016-10-24 DIAGNOSIS — I272 Pulmonary hypertension, unspecified: Secondary | ICD-10-CM | POA: Diagnosis not present

## 2016-10-24 DIAGNOSIS — I130 Hypertensive heart and chronic kidney disease with heart failure and stage 1 through stage 4 chronic kidney disease, or unspecified chronic kidney disease: Secondary | ICD-10-CM | POA: Insufficient documentation

## 2016-10-24 DIAGNOSIS — I5032 Chronic diastolic (congestive) heart failure: Secondary | ICD-10-CM | POA: Diagnosis not present

## 2016-10-24 DIAGNOSIS — Z86718 Personal history of other venous thrombosis and embolism: Secondary | ICD-10-CM | POA: Insufficient documentation

## 2016-10-24 DIAGNOSIS — Z7901 Long term (current) use of anticoagulants: Secondary | ICD-10-CM

## 2016-10-24 DIAGNOSIS — I2510 Atherosclerotic heart disease of native coronary artery without angina pectoris: Secondary | ICD-10-CM | POA: Diagnosis not present

## 2016-10-24 DIAGNOSIS — I482 Chronic atrial fibrillation, unspecified: Secondary | ICD-10-CM

## 2016-10-24 DIAGNOSIS — Z9981 Dependence on supplemental oxygen: Secondary | ICD-10-CM | POA: Insufficient documentation

## 2016-10-24 DIAGNOSIS — N183 Chronic kidney disease, stage 3 (moderate): Secondary | ICD-10-CM | POA: Diagnosis not present

## 2016-10-24 DIAGNOSIS — Z79899 Other long term (current) drug therapy: Secondary | ICD-10-CM | POA: Insufficient documentation

## 2016-10-24 DIAGNOSIS — N184 Chronic kidney disease, stage 4 (severe): Secondary | ICD-10-CM

## 2016-10-24 DIAGNOSIS — IMO0001 Reserved for inherently not codable concepts without codable children: Secondary | ICD-10-CM

## 2016-10-24 DIAGNOSIS — Z0389 Encounter for observation for other suspected diseases and conditions ruled out: Secondary | ICD-10-CM

## 2016-10-24 LAB — BASIC METABOLIC PANEL
ANION GAP: 11 (ref 5–15)
BUN: 30 mg/dL — ABNORMAL HIGH (ref 6–20)
CALCIUM: 9.3 mg/dL (ref 8.9–10.3)
CO2: 37 mmol/L — ABNORMAL HIGH (ref 22–32)
CREATININE: 2.18 mg/dL — AB (ref 0.44–1.00)
Chloride: 90 mmol/L — ABNORMAL LOW (ref 101–111)
GFR, EST AFRICAN AMERICAN: 22 mL/min — AB (ref >60)
GFR, EST NON AFRICAN AMERICAN: 19 mL/min — AB (ref >60)
GLUCOSE: 245 mg/dL — AB (ref 65–99)
Potassium: 5.1 mmol/L (ref 3.5–5.1)
Sodium: 138 mmol/L (ref 135–145)

## 2016-10-24 NOTE — Progress Notes (Signed)
Patient ID: [name], female   DOB: 12/04/1926, 82 y.o.   MRN: [id number]    Advanced Heart Failure Clinic Note   PCP: Dr. Doy Hutching Referring: Dr. Melvyn Novas Cardiology: Dr. Aundra Dubin  82 yo with chronic atrial fibrillation on coumadin, chronic diastolic CHF, and OHS presents for cardiology evaluation.  She has had several recent admissions.  In 6/16, she had syncope while sitting on the toilet.  She came to the ER.  Troponin was 0.21 and temperature was up to 101.  She was admitted and ended up having cardiac cath showing no obstructive CAD but 3+ MR.  Echo showed normal EF with grade III diastolic dysfunction, severe pulmonary hypertension, and per report only trivial MR.  She was re-admitted later in 6/16 after a fall and syncope again.  She was found to be profoundly hypoglycemic.  She is no longer on diabetes medications.    She was referred to Dr Melvyn Novas and has been on home oxygen for OHS.  She has been wheelchair-bound for the most part since 6/16.  She used to live alone prior to 6/16 but now lives with her daughter.  Admitted to [facility] 9/16 with marked volume overload. Diuresed with IV lasix and transitioned to torsemide 40 mg mg in am and 20 mg in pm. Had RHC with elevated filling pressures in a restrictive pattern and pulmonary venous htn. Discharge weight was 232 pounds.   Admitted for syncope 9/17-> 08/28/16. Extensive neurological work up negative. Xrays negative for acute fracture. Thought due to dehydration. Given IVF and chronic diuretic dosing decreased with concomitant AKI. EEG with mild to moderatediffuse slowing, absense of epileptiform.Discharge weight was 237 lbs.   She returns today for regular follow up. At last visit torsemide increased.  K also increase with subsequent labwork. Weight down 6 lbs since last visit. Breathing is about the same, but has gotten somewhat better.  Can only take about 15 steps due to deconditioning. Chronic orthopnea.  Wearing oxygen chronically at 3 L. No  BRBPR or Melena.  Weight at home 240 yesterday with no clothes.   Labs (7/16): K 3.7, creatinine 0.93, BNP 370 Labs (9/16): hgb 8.4 Labs (10/16): K 4.3, creatinine 1.41 Labs (11/16): K 3.7, creatinine 1.49, BNP 413 Labs (1/17): K 4.1, creatinine 1.38, BNP 389 Labs (3/17): K 4.9, creatinine 1.45 Labs (5/17): K 4.5, creatinine 1.3, hgb 9.1, plts 145 Labs (7/17): K 5, creatinine 1.44 Labs (10/17): K 3.1, creatinine 2.06, BNP 212  PMH: 1. HTN 2. Type II diabetes: Currently on no meds.  3. Syncope 4. Chronic atrial fibrillation: No history of CVA. 11/16 event monitor showed chronic atrial fibrillation, no other abnormalities. 5. Chronic diastolic CHF: Echo (123XX123) with EF 55-60%, grade III diastolic dysfunction, PA systolic pressure 76 mmHg, trivial MR. RHC 08/09/2015 RA mean 29, RV 73/24, PA 74/33, mean 45,PCWP mean 33,Oxygen saturations:PA 49%AO 98% ,CO 5.12 /CI2.27, PVR 2.3 WU. 6. Cardiolite (2/16) with EF 48%, no ischemia.  LHC (6/16) with 20% distal LAD stenosis, EF 55-60%, 3+ MR.  7. Mitral regurgitation: Unclear how significant this is.  Trivial per 6/16 echo but 3+ per 6/16 cath.  8. H/o DVT 9. DCIS s/p lumpectomy and radiation in 2011.   10. OA 11. Hyperlipidemia 12. OHS: On home oxygen.  13. Morbid obesity 14. Syncope 9/17   SH: Lives in [place] with family, never smoked.   FH: No premature CAD. +CHF.   ROS: All systems reviewed and negative except as per HPI.   Current Outpatient Prescriptions  Medication Sig Dispense Refill  . acetaminophen (TYLENOL) 500 MG tablet Take 1,000 mg by mouth 2 (two) times daily as needed for mild pain.     Marland Kitchen atorvastatin (LIPITOR) 20 MG tablet TAKE 1 TABLET BY MOUTH EVERY DAY 90 tablet 3  . gabapentin (NEURONTIN) 300 MG capsule Take 300 mg by mouth at bedtime.   3  . magnesium oxide (MAG-OX) 400 (241.3 Mg) MG tablet Take 1 tablet (400 mg total) by mouth daily. 30 tablet 0  . metoprolol tartrate (LOPRESSOR) 25 MG tablet Take 0.5 tablets (12.5  mg total) by mouth 2 (two) times daily. 30 tablet 0  . pantoprazole (PROTONIX) 40 MG tablet Take 1 tablet (40 mg total) by mouth 2 (two) times daily before a meal. 60 tablet 3  . potassium chloride SA (K-DUR,KLOR-CON) 20 MEQ tablet Take 3 tablets in the AM and 2 tablets in the PM 150 tablet 3  . torsemide (DEMADEX) 20 MG tablet Take 80 mg (4 tabs) in am and 60 mg (3 tabs) in pm 210 tablet 3  . warfarin (COUMADIN) 2 MG tablet Take 3 mg by mouth daily.      No current facility-administered medications for this encounter.    BP (!) 144/78 (BP Location: Left Wrist, Patient Position: Sitting, Cuff Size: Normal)   Pulse (!) 112   Wt 242 lb 3.2 oz (109.9 kg)   SpO2 97%   BMI 41.57 kg/m   Wt Readings from Last 3 Encounters:  10/24/16 242 lb 3.2 oz (109.9 kg)  10/17/16 248 lb (112.5 kg)  09/12/16 236 lb (107 kg)     General: NAD, obese Arrived in wheel chair . Daughter present .  Neck: JVP 9-10 cm, no thyromegaly or thyroid nodule.  Lungs: Clear on 3 liters.   CV: Nondisplaced PMI.  Heart irregular S1/S2, no S3/S4, 2/6 HSM at LLSB.  1+ ankle edema. No carotid bruit.  Normal pedal pulses.  Abdomen: Obese, soft, NT, ND, no HSM. No bruits or masses. +BS  Skin: Intact without lesions or rashes.  Neurologic: Alert and oriented x 3.  Psych: Normal affect. Extremities: No clubbing or cyanosis.  HEENT: Normal.   Assessment/Plan: 1. Chronic diastolic CHF: Echo [id number] with LVEF 55-60%, RV mod reduced systolic function.  - NYHA class IIIb symptoms, though activity very limited.  She is volume overloaded today on decreased diuretics.  - Continue torsemide 80 q am /60 q pm KCl to 60 meq q am and 40 q pm.   - Will hold metolazone for the time being.  - Repeat BMET today.  2. HTN:  - Relatively stable. Has been very soft at previous visits, so will not uptitrate meds today with advanced age and limited mobility.  3. Pulmonary hypertension:  -Likely pulmonary venous hypertension that improved with  diuresis. Improved with continuous oxygen.  4. Murmur: Moderate TR on echo.   5. Atrial fibrillation: Chronic. Continue rate control/anticoagulation strategy.  - Remains on warfarin and metoprolol with adequate rate control. No bleeding 6. CKD: Stage III.  - BMET today.  7. OHS: - On chronic home 02 3 L.     Volume improved. Can take an extra 20 mg of torsemide in the evening as needed. BMET today.  Follow up 4 weeks.  Pt knows to call with any complaints or if weight starts to go back up.   Shirley Friar, PA-C 10/24/2016

## 2016-10-24 NOTE — Patient Instructions (Signed)
Labs today We will only contact you if something comes back abnormal or we need to make some changes. Otherwise no news is good news!  Your physician recommends that you schedule a follow-up appointment in: 4 weeks with [name] and 2-3 months with Diane Aundra Dubin  Do the following things EVERYDAY: 1) Weigh yourself in the morning before breakfast. Write it down and keep it in a log. 2) Take your medicines as prescribed 3) Eat low salt foods-Limit salt (sodium) to 2000 mg per day.  4) Stay as active as you can everyday 5) Limit all fluids for the day to less than 2 liters

## 2016-10-27 DIAGNOSIS — S22050D Wedge compression fracture of T5-T6 vertebra, subsequent encounter for fracture with routine healing: Secondary | ICD-10-CM | POA: Diagnosis not present

## 2016-10-27 DIAGNOSIS — I5032 Chronic diastolic (congestive) heart failure: Secondary | ICD-10-CM | POA: Diagnosis not present

## 2016-10-27 DIAGNOSIS — I340 Nonrheumatic mitral (valve) insufficiency: Secondary | ICD-10-CM | POA: Diagnosis not present

## 2016-10-27 DIAGNOSIS — I130 Hypertensive heart and chronic kidney disease with heart failure and stage 1 through stage 4 chronic kidney disease, or unspecified chronic kidney disease: Secondary | ICD-10-CM | POA: Diagnosis not present

## 2016-10-27 DIAGNOSIS — I2729 Other secondary pulmonary hypertension: Secondary | ICD-10-CM | POA: Diagnosis not present

## 2016-10-27 DIAGNOSIS — E1122 Type 2 diabetes mellitus with diabetic chronic kidney disease: Secondary | ICD-10-CM | POA: Diagnosis not present

## 2016-10-27 DIAGNOSIS — I4891 Unspecified atrial fibrillation: Secondary | ICD-10-CM | POA: Diagnosis not present

## 2016-10-27 DIAGNOSIS — N184 Chronic kidney disease, stage 4 (severe): Secondary | ICD-10-CM | POA: Diagnosis not present

## 2016-10-27 DIAGNOSIS — E1165 Type 2 diabetes mellitus with hyperglycemia: Secondary | ICD-10-CM | POA: Diagnosis not present

## 2016-10-28 ENCOUNTER — Other Ambulatory Visit (HOSPITAL_COMMUNITY): Payer: Self-pay | Admitting: *Deleted

## 2016-10-28 ENCOUNTER — Telehealth (HOSPITAL_COMMUNITY): Payer: Self-pay | Admitting: *Deleted

## 2016-10-28 DIAGNOSIS — I5022 Chronic systolic (congestive) heart failure: Secondary | ICD-10-CM

## 2016-10-28 MED ORDER — TORSEMIDE 20 MG PO TABS: 60.0000 mg | ORAL_TABLET | Freq: Two times a day (BID) | ORAL | 3 refills | Status: DC

## 2016-10-28 MED ORDER — POTASSIUM CHLORIDE CRYS ER 20 MEQ PO TBCR: 40.0000 meq | EXTENDED_RELEASE_TABLET | Freq: Two times a day (BID) | ORAL | 3 refills | Status: DC

## 2016-10-28 NOTE — Telephone Encounter (Signed)
-----   Message from Shirley Friar, PA-C sent at 10/24/2016 11:21 AM EST ----- Hold Torsemide and Potassium x 2 days.   Resume torsemide 60 mg BID and K 40 meq BID on Sunday EVENING.   Needs repeat BMET 10 days.    Legrand Como [address]" [place], PA-C 10/24/2016 11:20 AM

## 2016-10-28 NOTE — Telephone Encounter (Signed)
Notes Recorded by Harvie Junior, CMA on 10/28/2016 at 10:19 AM EST Spoke with patients grand daughter and reviewed results. Updated meds in patients chart. Added to last schedule next week. ------  Notes Recorded by Kerry Dory, CMA on 10/27/2016 at 4:01 PM EST Unable to reach [phone] Left message for patient to call back.    ------  Notes Recorded by Shirley Friar, PA-C on 10/24/2016 at 11:21 AM EST Hold Torsemide and Potassium x 2 days.   Resume torsemide 60 mg BID and K 40 meq BID on Sunday EVENING.   Needs repeat BMET 10 days.    Beryle Beams" [place], PA-C 10/24/2016 11:20 AM    Ref Range & Units 4d ago 11d ago 22mo ago   Sodium 135 - 145 mmol/L 138  137  133     Potassium 3.5 - 5.1 mmol/L 5.1  3.4   3.1     Chloride 101 - 111 mmol/L 90   88   76     CO2 22 - 32 mmol/L 37   35   42     Glucose, Bld 65 - 99 mg/dL 245   180   173     BUN 6 - 20 mg/dL 30   25   22      Creatinine, Ser 0.44 - 1.00 mg/dL 2.18   1.49   2.06     Calcium 8.9 - 10.3 mg/dL 9.3  8.9  9.1    GFR calc non Af Amer >60 mL/min 19   30   21      GFR calc Af Amer >60 mL/min 22   35CM   24CM

## 2016-10-29 DIAGNOSIS — N184 Chronic kidney disease, stage 4 (severe): Secondary | ICD-10-CM | POA: Diagnosis not present

## 2016-10-29 DIAGNOSIS — I130 Hypertensive heart and chronic kidney disease with heart failure and stage 1 through stage 4 chronic kidney disease, or unspecified chronic kidney disease: Secondary | ICD-10-CM | POA: Diagnosis not present

## 2016-10-29 DIAGNOSIS — E1165 Type 2 diabetes mellitus with hyperglycemia: Secondary | ICD-10-CM | POA: Diagnosis not present

## 2016-10-29 DIAGNOSIS — E1122 Type 2 diabetes mellitus with diabetic chronic kidney disease: Secondary | ICD-10-CM | POA: Diagnosis not present

## 2016-10-29 DIAGNOSIS — S22050D Wedge compression fracture of T5-T6 vertebra, subsequent encounter for fracture with routine healing: Secondary | ICD-10-CM | POA: Diagnosis not present

## 2016-10-29 DIAGNOSIS — I2729 Other secondary pulmonary hypertension: Secondary | ICD-10-CM | POA: Diagnosis not present

## 2016-10-29 DIAGNOSIS — I5032 Chronic diastolic (congestive) heart failure: Secondary | ICD-10-CM | POA: Diagnosis not present

## 2016-10-29 DIAGNOSIS — I4891 Unspecified atrial fibrillation: Secondary | ICD-10-CM | POA: Diagnosis not present

## 2016-10-29 DIAGNOSIS — I340 Nonrheumatic mitral (valve) insufficiency: Secondary | ICD-10-CM | POA: Diagnosis not present

## 2016-10-31 DIAGNOSIS — I4891 Unspecified atrial fibrillation: Secondary | ICD-10-CM | POA: Diagnosis not present

## 2016-10-31 DIAGNOSIS — I2729 Other secondary pulmonary hypertension: Secondary | ICD-10-CM | POA: Diagnosis not present

## 2016-10-31 DIAGNOSIS — E1122 Type 2 diabetes mellitus with diabetic chronic kidney disease: Secondary | ICD-10-CM | POA: Diagnosis not present

## 2016-10-31 DIAGNOSIS — S22050D Wedge compression fracture of T5-T6 vertebra, subsequent encounter for fracture with routine healing: Secondary | ICD-10-CM | POA: Diagnosis not present

## 2016-10-31 DIAGNOSIS — I5032 Chronic diastolic (congestive) heart failure: Secondary | ICD-10-CM | POA: Diagnosis not present

## 2016-10-31 DIAGNOSIS — I340 Nonrheumatic mitral (valve) insufficiency: Secondary | ICD-10-CM | POA: Diagnosis not present

## 2016-10-31 DIAGNOSIS — I130 Hypertensive heart and chronic kidney disease with heart failure and stage 1 through stage 4 chronic kidney disease, or unspecified chronic kidney disease: Secondary | ICD-10-CM | POA: Diagnosis not present

## 2016-10-31 DIAGNOSIS — E1165 Type 2 diabetes mellitus with hyperglycemia: Secondary | ICD-10-CM | POA: Diagnosis not present

## 2016-10-31 DIAGNOSIS — N184 Chronic kidney disease, stage 4 (severe): Secondary | ICD-10-CM | POA: Diagnosis not present

## 2016-11-03 DIAGNOSIS — I5032 Chronic diastolic (congestive) heart failure: Secondary | ICD-10-CM | POA: Diagnosis not present

## 2016-11-03 DIAGNOSIS — I2729 Other secondary pulmonary hypertension: Secondary | ICD-10-CM | POA: Diagnosis not present

## 2016-11-03 DIAGNOSIS — E1122 Type 2 diabetes mellitus with diabetic chronic kidney disease: Secondary | ICD-10-CM | POA: Diagnosis not present

## 2016-11-03 DIAGNOSIS — N184 Chronic kidney disease, stage 4 (severe): Secondary | ICD-10-CM | POA: Diagnosis not present

## 2016-11-03 DIAGNOSIS — I130 Hypertensive heart and chronic kidney disease with heart failure and stage 1 through stage 4 chronic kidney disease, or unspecified chronic kidney disease: Secondary | ICD-10-CM | POA: Diagnosis not present

## 2016-11-03 DIAGNOSIS — I4891 Unspecified atrial fibrillation: Secondary | ICD-10-CM | POA: Diagnosis not present

## 2016-11-03 DIAGNOSIS — E1165 Type 2 diabetes mellitus with hyperglycemia: Secondary | ICD-10-CM | POA: Diagnosis not present

## 2016-11-03 DIAGNOSIS — S22050D Wedge compression fracture of T5-T6 vertebra, subsequent encounter for fracture with routine healing: Secondary | ICD-10-CM | POA: Diagnosis not present

## 2016-11-03 DIAGNOSIS — I340 Nonrheumatic mitral (valve) insufficiency: Secondary | ICD-10-CM | POA: Diagnosis not present

## 2016-11-05 DIAGNOSIS — I340 Nonrheumatic mitral (valve) insufficiency: Secondary | ICD-10-CM | POA: Diagnosis not present

## 2016-11-05 DIAGNOSIS — S22050D Wedge compression fracture of T5-T6 vertebra, subsequent encounter for fracture with routine healing: Secondary | ICD-10-CM | POA: Diagnosis not present

## 2016-11-05 DIAGNOSIS — E1165 Type 2 diabetes mellitus with hyperglycemia: Secondary | ICD-10-CM | POA: Diagnosis not present

## 2016-11-05 DIAGNOSIS — I5032 Chronic diastolic (congestive) heart failure: Secondary | ICD-10-CM | POA: Diagnosis not present

## 2016-11-05 DIAGNOSIS — I130 Hypertensive heart and chronic kidney disease with heart failure and stage 1 through stage 4 chronic kidney disease, or unspecified chronic kidney disease: Secondary | ICD-10-CM | POA: Diagnosis not present

## 2016-11-05 DIAGNOSIS — N184 Chronic kidney disease, stage 4 (severe): Secondary | ICD-10-CM | POA: Diagnosis not present

## 2016-11-05 DIAGNOSIS — I2729 Other secondary pulmonary hypertension: Secondary | ICD-10-CM | POA: Diagnosis not present

## 2016-11-05 DIAGNOSIS — E1122 Type 2 diabetes mellitus with diabetic chronic kidney disease: Secondary | ICD-10-CM | POA: Diagnosis not present

## 2016-11-05 DIAGNOSIS — I4891 Unspecified atrial fibrillation: Secondary | ICD-10-CM | POA: Diagnosis not present

## 2016-11-06 ENCOUNTER — Ambulatory Visit (HOSPITAL_COMMUNITY)
Admission: RE | Admit: 2016-11-06 | Discharge: 2016-11-06 | Disposition: A | Discharge status: Home / Self Care | Payer: Commercial Managed Care - HMO | Source: Ambulatory Visit | Attending: Cardiology | Admitting: Cardiology

## 2016-11-06 DIAGNOSIS — I5022 Chronic systolic (congestive) heart failure: Secondary | ICD-10-CM | POA: Insufficient documentation

## 2016-11-06 LAB — BASIC METABOLIC PANEL
ANION GAP: 12 (ref 5–15)
BUN: 33 mg/dL — AB (ref 6–20)
CHLORIDE: 89 mmol/L — AB (ref 101–111)
CO2: 36 mmol/L — ABNORMAL HIGH (ref 22–32)
Calcium: 9 mg/dL (ref 8.9–10.3)
Creatinine, Ser: 1.98 mg/dL — ABNORMAL HIGH (ref 0.44–1.00)
GFR calc Af Amer: 25 mL/min — ABNORMAL LOW (ref >60)
GFR, EST NON AFRICAN AMERICAN: 22 mL/min — AB (ref >60)
Glucose, Bld: 258 mg/dL — ABNORMAL HIGH (ref 65–99)
POTASSIUM: 4.8 mmol/L (ref 3.5–5.1)
SODIUM: 137 mmol/L (ref 135–145)

## 2016-11-10 DIAGNOSIS — I4891 Unspecified atrial fibrillation: Secondary | ICD-10-CM | POA: Diagnosis not present

## 2016-11-10 DIAGNOSIS — S22050D Wedge compression fracture of T5-T6 vertebra, subsequent encounter for fracture with routine healing: Secondary | ICD-10-CM | POA: Diagnosis not present

## 2016-11-10 DIAGNOSIS — I130 Hypertensive heart and chronic kidney disease with heart failure and stage 1 through stage 4 chronic kidney disease, or unspecified chronic kidney disease: Secondary | ICD-10-CM | POA: Diagnosis not present

## 2016-11-10 DIAGNOSIS — E1165 Type 2 diabetes mellitus with hyperglycemia: Secondary | ICD-10-CM | POA: Diagnosis not present

## 2016-11-10 DIAGNOSIS — I340 Nonrheumatic mitral (valve) insufficiency: Secondary | ICD-10-CM | POA: Diagnosis not present

## 2016-11-10 DIAGNOSIS — I5032 Chronic diastolic (congestive) heart failure: Secondary | ICD-10-CM | POA: Diagnosis not present

## 2016-11-10 DIAGNOSIS — E1122 Type 2 diabetes mellitus with diabetic chronic kidney disease: Secondary | ICD-10-CM | POA: Diagnosis not present

## 2016-11-10 DIAGNOSIS — N184 Chronic kidney disease, stage 4 (severe): Secondary | ICD-10-CM | POA: Diagnosis not present

## 2016-11-10 DIAGNOSIS — I2729 Other secondary pulmonary hypertension: Secondary | ICD-10-CM | POA: Diagnosis not present

## 2016-11-11 ENCOUNTER — Encounter (INDEPENDENT_AMBULATORY_CARE_PROVIDER_SITE_OTHER): Payer: Commercial Managed Care - HMO | Admitting: Podiatry

## 2016-11-11 DIAGNOSIS — K922 Gastrointestinal hemorrhage, unspecified: Secondary | ICD-10-CM | POA: Diagnosis not present

## 2016-11-11 DIAGNOSIS — I10 Essential (primary) hypertension: Secondary | ICD-10-CM | POA: Diagnosis not present

## 2016-11-11 DIAGNOSIS — I4891 Unspecified atrial fibrillation: Secondary | ICD-10-CM | POA: Diagnosis not present

## 2016-11-11 DIAGNOSIS — K264 Chronic or unspecified duodenal ulcer with hemorrhage: Secondary | ICD-10-CM | POA: Diagnosis not present

## 2016-11-11 DIAGNOSIS — I5043 Acute on chronic combined systolic (congestive) and diastolic (congestive) heart failure: Secondary | ICD-10-CM | POA: Diagnosis not present

## 2016-11-11 NOTE — Progress Notes (Signed)
This encounter was created in error - please disregard.

## 2016-11-21 ENCOUNTER — Encounter (HOSPITAL_COMMUNITY): Payer: Commercial Managed Care - HMO

## 2016-12-04 ENCOUNTER — Other Ambulatory Visit (HOSPITAL_COMMUNITY): Payer: Self-pay | Admitting: *Deleted

## 2016-12-04 MED ORDER — TORSEMIDE 20 MG PO TABS: 60.0000 mg | ORAL_TABLET | Freq: Two times a day (BID) | ORAL | 3 refills | Status: DC

## 2016-12-12 DIAGNOSIS — K922 Gastrointestinal hemorrhage, unspecified: Secondary | ICD-10-CM | POA: Diagnosis not present

## 2016-12-12 DIAGNOSIS — I5043 Acute on chronic combined systolic (congestive) and diastolic (congestive) heart failure: Secondary | ICD-10-CM | POA: Diagnosis not present

## 2016-12-12 DIAGNOSIS — K264 Chronic or unspecified duodenal ulcer with hemorrhage: Secondary | ICD-10-CM | POA: Diagnosis not present

## 2016-12-12 DIAGNOSIS — I10 Essential (primary) hypertension: Secondary | ICD-10-CM | POA: Diagnosis not present

## 2016-12-12 DIAGNOSIS — I4891 Unspecified atrial fibrillation: Secondary | ICD-10-CM | POA: Diagnosis not present

## 2017-01-02 ENCOUNTER — Other Ambulatory Visit (HOSPITAL_COMMUNITY): Payer: Self-pay | Admitting: *Deleted

## 2017-01-02 MED ORDER — TORSEMIDE 20 MG PO TABS: 60.0000 mg | ORAL_TABLET | Freq: Two times a day (BID) | ORAL | 3 refills | Status: DC

## 2017-01-12 DIAGNOSIS — I5043 Acute on chronic combined systolic (congestive) and diastolic (congestive) heart failure: Secondary | ICD-10-CM | POA: Diagnosis not present

## 2017-01-12 DIAGNOSIS — K922 Gastrointestinal hemorrhage, unspecified: Secondary | ICD-10-CM | POA: Diagnosis not present

## 2017-01-12 DIAGNOSIS — K264 Chronic or unspecified duodenal ulcer with hemorrhage: Secondary | ICD-10-CM | POA: Diagnosis not present

## 2017-01-12 DIAGNOSIS — I10 Essential (primary) hypertension: Secondary | ICD-10-CM | POA: Diagnosis not present

## 2017-01-12 DIAGNOSIS — I4891 Unspecified atrial fibrillation: Secondary | ICD-10-CM | POA: Diagnosis not present

## 2017-01-21 DIAGNOSIS — M170 Bilateral primary osteoarthritis of knee: Secondary | ICD-10-CM | POA: Diagnosis not present

## 2017-01-21 DIAGNOSIS — M1711 Unilateral primary osteoarthritis, right knee: Secondary | ICD-10-CM | POA: Diagnosis not present

## 2017-01-21 DIAGNOSIS — M1712 Unilateral primary osteoarthritis, left knee: Secondary | ICD-10-CM | POA: Diagnosis not present

## 2017-01-26 ENCOUNTER — Ambulatory Visit (HOSPITAL_COMMUNITY)
Admission: RE | Admit: 2017-01-26 | Discharge: 2017-01-26 | Disposition: A | Discharge status: Home / Self Care | Payer: Commercial Managed Care - HMO | Source: Ambulatory Visit | Attending: Cardiology | Admitting: Cardiology

## 2017-01-26 ENCOUNTER — Telehealth (HOSPITAL_COMMUNITY): Payer: Self-pay | Admitting: *Deleted

## 2017-01-26 VITALS — BP 98/64 | HR 108 | Wt 228.8 lb

## 2017-01-26 DIAGNOSIS — Z9981 Dependence on supplemental oxygen: Secondary | ICD-10-CM | POA: Insufficient documentation

## 2017-01-26 DIAGNOSIS — I482 Chronic atrial fibrillation, unspecified: Secondary | ICD-10-CM

## 2017-01-26 DIAGNOSIS — I130 Hypertensive heart and chronic kidney disease with heart failure and stage 1 through stage 4 chronic kidney disease, or unspecified chronic kidney disease: Secondary | ICD-10-CM | POA: Insufficient documentation

## 2017-01-26 DIAGNOSIS — E785 Hyperlipidemia, unspecified: Secondary | ICD-10-CM | POA: Insufficient documentation

## 2017-01-26 DIAGNOSIS — I5032 Chronic diastolic (congestive) heart failure: Secondary | ICD-10-CM | POA: Diagnosis not present

## 2017-01-26 DIAGNOSIS — I340 Nonrheumatic mitral (valve) insufficiency: Secondary | ICD-10-CM | POA: Diagnosis not present

## 2017-01-26 DIAGNOSIS — I272 Pulmonary hypertension, unspecified: Secondary | ICD-10-CM | POA: Insufficient documentation

## 2017-01-26 DIAGNOSIS — N183 Chronic kidney disease, stage 3 (moderate): Secondary | ICD-10-CM | POA: Diagnosis not present

## 2017-01-26 DIAGNOSIS — I509 Heart failure, unspecified: Secondary | ICD-10-CM

## 2017-01-26 DIAGNOSIS — E1122 Type 2 diabetes mellitus with diabetic chronic kidney disease: Secondary | ICD-10-CM | POA: Diagnosis not present

## 2017-01-26 DIAGNOSIS — Z86718 Personal history of other venous thrombosis and embolism: Secondary | ICD-10-CM | POA: Insufficient documentation

## 2017-01-26 DIAGNOSIS — Z7901 Long term (current) use of anticoagulants: Secondary | ICD-10-CM | POA: Diagnosis not present

## 2017-01-26 DIAGNOSIS — N184 Chronic kidney disease, stage 4 (severe): Secondary | ICD-10-CM

## 2017-01-26 DIAGNOSIS — Z79899 Other long term (current) drug therapy: Secondary | ICD-10-CM | POA: Insufficient documentation

## 2017-01-26 LAB — BASIC METABOLIC PANEL
ANION GAP: 14 (ref 5–15)
BUN: 32 mg/dL — AB (ref 6–20)
CHLORIDE: 86 mmol/L — AB (ref 101–111)
CO2: 34 mmol/L — ABNORMAL HIGH (ref 22–32)
Calcium: 9 mg/dL (ref 8.9–10.3)
Creatinine, Ser: 2.7 mg/dL — ABNORMAL HIGH (ref 0.44–1.00)
GFR calc Af Amer: 17 mL/min — ABNORMAL LOW (ref >60)
GFR, EST NON AFRICAN AMERICAN: 15 mL/min — AB (ref >60)
Glucose, Bld: 286 mg/dL — ABNORMAL HIGH (ref 65–99)
POTASSIUM: 5.5 mmol/L — AB (ref 3.5–5.1)
SODIUM: 134 mmol/L — AB (ref 135–145)

## 2017-01-26 LAB — BRAIN NATRIURETIC PEPTIDE: B Natriuretic Peptide: 194.1 pg/mL — ABNORMAL HIGH (ref 0.0–100.0)

## 2017-01-26 LAB — CBC
HCT: 29.9 % — ABNORMAL LOW (ref 36.0–46.0)
HEMOGLOBIN: 8.9 g/dL — AB (ref 12.0–15.0)
MCH: 27.1 pg (ref 26.0–34.0)
MCHC: 29.8 g/dL — ABNORMAL LOW (ref 30.0–36.0)
MCV: 91.2 fL (ref 78.0–100.0)
Platelets: 206 10*3/uL (ref 150–400)
RBC: 3.28 MIL/uL — AB (ref 3.87–5.11)
RDW: 15.7 % — ABNORMAL HIGH (ref 11.5–15.5)
WBC: 8.4 10*3/uL (ref 4.0–10.5)

## 2017-01-26 MED ORDER — METOPROLOL SUCCINATE ER 25 MG PO TB24: 25.0000 mg | ORAL_TABLET | Freq: Every day | ORAL | 3 refills | Status: DC

## 2017-01-26 MED ORDER — POTASSIUM CHLORIDE CRYS ER 20 MEQ PO TBCR: 40.0000 meq | EXTENDED_RELEASE_TABLET | Freq: Every day | ORAL | 3 refills | Status: AC

## 2017-01-26 MED ORDER — TORSEMIDE 20 MG PO TABS: ORAL_TABLET | ORAL | 3 refills | Status: DC

## 2017-01-26 NOTE — Patient Instructions (Addendum)
Stop Metoprolol tartrate  Start Metoprolol Succinate 25 mg daily at bedtime  Labs today  You have been referred to [facility] for Physical Therapy  Your physician recommends that you schedule a follow-up appointment in: 2 months

## 2017-01-26 NOTE — Telephone Encounter (Signed)
Basic metabolic panel  Order: [id number]  Status:  Final result Visible to patient:  No (Not Released) Dx:  Chronic diastolic congestive heart fa...  Notes Recorded by Kennieth Rad, RN on 01/26/2017 at 3:21 PM EST Spoke with pt's daughter and she is agreeable with plan. Medications updated, lab appointment made, and lab order placed. ------  Notes Recorded by [name], MD on 01/26/2017 at 2:40 PM EST Hold KCl x 2 days then decrease to 40 mEq daily. Decrease torsemide to 80 mg daily x 1 week, then can go back to 80 qam/40 qpm. Will need BMET in 1 week.

## 2017-01-26 NOTE — Progress Notes (Signed)
Patient ID: [name], female   DOB: 09/17/1926, 83 y.o.   MRN: [id number]    Advanced Heart Failure Clinic Note   PCP: Dr. Doy Hutching Referring: Dr. Melvyn Novas Cardiology: Dr. Aundra Dubin  83 yo with chronic atrial fibrillation on coumadin, chronic diastolic CHF, and OHS presents for cardiology evaluation.  She has had several recent admissions.  In 6/16, she had syncope while sitting on the toilet.  She came to the ER.  Troponin was 0.21 and temperature was up to 101.  She was admitted and ended up having cardiac cath showing no obstructive CAD but 3+ MR.  Echo showed normal EF with grade III diastolic dysfunction, severe pulmonary hypertension, and per report only trivial MR.  She was re-admitted later in 6/16 after a fall and syncope again.  She was found to be profoundly hypoglycemic.  She is no longer on diabetes medications.    She was referred to Dr Melvyn Novas and has been on home oxygen for OHS.  She has been wheelchair-bound for the most part since 6/16.  She used to live alone prior to 6/16 but now lives with her daughter.  Admitted to [facility] 9/16 with marked volume overload. Diuresed with IV lasix and transitioned to torsemide 40 mg mg in am and 20 mg in pm. Had RHC with elevated filling pressures in a restrictive pattern and pulmonary venous htn. Discharge weight was 232 pounds.   Admitted for syncope 9/17-> 08/28/16. Extensive neurological work up negative. Xrays negative for acute fracture. Thought due to dehydration. Given IVF and chronic diuretic dosing decreased with concomitant AKI. EEG with mild to moderatediffuse slowing, absence of epileptiform changes.Discharge weight was 237 lbs.   She returns today for regular follow up. Weight is down 14 lbs on higher dose of torsemide.  Lower leg edema has resolved.  She walks very little.  Says that her legs are "heavy" and that she cannot get far.  More prominent fatigue than dyspnea.  Bilateral knee pain.  She will get lightheaded with standing rarely.  Occasional episodes of nausea/vomiting, no trigger.  Chronic orthopnea.  Wearing oxygen chronically at 3 L. No BRBPR or melena.   Labs (7/16): K 3.7, creatinine 0.93, BNP 370 Labs (9/16): hgb 8.4 Labs (10/16): K 4.3, creatinine 1.41 Labs (11/16): K 3.7, creatinine 1.49, BNP 413 Labs (1/17): K 4.1, creatinine 1.38, BNP 389 Labs (3/17): K 4.9, creatinine 1.45 Labs (5/17): K 4.5, creatinine 1.3, hgb 9.1, plts 145 Labs (7/17): K 5, creatinine 1.44 Labs (10/17): K 3.1, creatinine 2.06, BNP 212 Labs (11/17): K 4.8, creatinine 1.98  ECG: atrial fibrillation, rate 101.   PMH: 1. HTN 2. Type II diabetes: Currently on no meds.  3. Syncope 4. Chronic atrial fibrillation: No history of CVA. 11/16 event monitor showed chronic atrial fibrillation, no other abnormalities. 5. Chronic diastolic CHF: Echo (123XX123) with EF 55-60%, grade III diastolic dysfunction, PA systolic pressure 76 mmHg, trivial MR.  - RHC 08/09/2015: RA mean 29, PA 74/33 mean 45, PCWP mean 33, CO 5.12 /CI2.27, PVR 2.3 WU. - Echo (4/17): EF 60-65%, mild RV dilation with normal systolic function, PASP 83 mmHg.  6. Cardiolite (2/16) with EF 48%, no ischemia.  LHC (6/16) with 20% distal LAD stenosis, EF 55-60%, 3+ MR.  7. Mitral regurgitation: Unclear how significant this is.  Trivial per 6/16 echo but 3+ per 6/16 cath.  8. H/o DVT 9. DCIS s/p lumpectomy and radiation in 2011.   10. OA 11. Hyperlipidemia 12. OHS: On home oxygen.  13.  Morbid obesity 14. Syncope 9/17   SH: Lives in [place] with family, never smoked.   FH: No premature CAD. +CHF.   ROS: All systems reviewed and negative except as per HPI.   Current Outpatient Prescriptions  Medication Sig Dispense Refill  . acetaminophen (TYLENOL) 500 MG tablet Take 1,000 mg by mouth 2 (two) times daily as needed for mild pain.     Marland Kitchen atorvastatin (LIPITOR) 20 MG tablet TAKE 1 TABLET BY MOUTH EVERY DAY 90 tablet 3  . gabapentin (NEURONTIN) 300 MG capsule Take 300 mg by mouth at  bedtime.   3  . magnesium oxide (MAG-OX) 400 (241.3 Mg) MG tablet Take 1 tablet (400 mg total) by mouth daily. 30 tablet 0  . pantoprazole (PROTONIX) 40 MG tablet Take 1 tablet (40 mg total) by mouth 2 (two) times daily before a meal. 60 tablet 3  . warfarin (COUMADIN) 2 MG tablet Take 3 mg by mouth daily.     . metoprolol succinate (TOPROL-XL) 25 MG 24 hr tablet Take 1 tablet (25 mg total) by mouth at bedtime. 30 tablet 3  . potassium chloride SA (K-DUR,KLOR-CON) 20 MEQ tablet Take 2 tablets (40 mEq total) by mouth daily. 60 tablet 3  . torsemide (DEMADEX) 20 MG tablet Take 80 mg (4 tabs) in AM and 40 mg (2 tabs) in PM. 180 tablet 3   No current facility-administered medications for this encounter.    BP 98/64 (BP Location: Left Wrist, Patient Position: Sitting, Cuff Size: Normal)   Pulse (!) 108 Comment: irregular  Wt 228 lb 12.8 oz (103.8 kg)   SpO2 100% Comment: on 2L  BMI 39.27 kg/m   Wt Readings from Last 3 Encounters:  01/26/17 228 lb 12.8 oz (103.8 kg)  10/24/16 242 lb 3.2 oz (109.9 kg)  10/17/16 248 lb (112.5 kg)     General: NAD, obese Arrived in wheel chair . Daughter present .  Neck: JVP 8 cm, no thyromegaly or thyroid nodule.  Lungs: Clear on 3 liters.  Crackles at bases bilaterally. CV: Nondisplaced PMI.  Heart irregular S1/S2, no S3/S4, 2/6 HSM at LLSB.  No edema. No carotid bruit.  Normal pedal pulses.  Abdomen: Obese, soft, NT, ND, no HSM. No bruits or masses. +BS  Skin: Intact without lesions or rashes.  Neurologic: Alert and oriented x 3.  Psych: Normal affect. Extremities: No clubbing or cyanosis.  HEENT: Normal.   Assessment/Plan: 1. Chronic diastolic CHF: Echo 123XX123 with EF 60-65%, mildly dilated RV with normal systolic function, severe pulmonary hypertension.  NYHA class IIIb symptoms, probably limited more by obesity and deconditioning than anything else.  Volume status looks ok. - Continue torsemide 80 q am /60 q pm and KCl to 60 meq q am and 40 q pm.   -  Will hold metolazone for the time being.  - Repeat BMET/BNP today.  2. HTN: BP low today.  I am going to have her stop bid metoprolol and start Toprol XL 25 mg daily in the evening.  3. Pulmonary hypertension:  Likely pulmonary venous hypertension that improved with diuresis. Improved with continuous oxygen.  4. Atrial fibrillation: Chronic. Continue rate control/anticoagulation strategy.  - Remains on warfarin and metoprolol (change to Toprol XL as above) with adequate rate control.  5. CKD: Stage III.  BMET today.  6. OHS: On chronic home 02 3 L.    I will arrange for PT at home.  She needs mobilization.   Loralie Champagne, 01/26/2017

## 2017-01-27 NOTE — Progress Notes (Signed)
OT Note - Addendum    2016-09-11 1350  OT Visit Information  Last OT Received On 09-11-2016  OT G-codes **NOT FOR INPATIENT CLASS**  Functional Assessment Tool Used Clinical judgement  Functional Assessment Tool Used clinical judgement  Functional Limitation Self care  Self Care Current Status [phone]) CL  Self Care Goal Status [id number]) CJ  Agustin Swatek, OT/L for Emmit Alexanders OTR/L [id number] 09-11-16

## 2017-01-28 DIAGNOSIS — M170 Bilateral primary osteoarthritis of knee: Secondary | ICD-10-CM | POA: Diagnosis not present

## 2017-02-02 ENCOUNTER — Ambulatory Visit (HOSPITAL_COMMUNITY)
Admission: RE | Admit: 2017-02-02 | Discharge: 2017-02-02 | Disposition: A | Discharge status: Home / Self Care | Payer: Commercial Managed Care - HMO | Source: Ambulatory Visit | Attending: Internal Medicine | Admitting: Internal Medicine

## 2017-02-02 DIAGNOSIS — I509 Heart failure, unspecified: Secondary | ICD-10-CM | POA: Insufficient documentation

## 2017-02-02 LAB — BASIC METABOLIC PANEL
ANION GAP: 12 (ref 5–15)
BUN: 31 mg/dL — AB (ref 6–20)
CHLORIDE: 85 mmol/L — AB (ref 101–111)
CO2: 37 mmol/L — ABNORMAL HIGH (ref 22–32)
Calcium: 9.1 mg/dL (ref 8.9–10.3)
Creatinine, Ser: 1.9 mg/dL — ABNORMAL HIGH (ref 0.44–1.00)
GFR, EST AFRICAN AMERICAN: 26 mL/min — AB (ref >60)
GFR, EST NON AFRICAN AMERICAN: 22 mL/min — AB (ref >60)
Glucose, Bld: 180 mg/dL — ABNORMAL HIGH (ref 65–99)
POTASSIUM: 4.1 mmol/L (ref 3.5–5.1)
SODIUM: 134 mmol/L — AB (ref 135–145)

## 2017-02-04 DIAGNOSIS — M1712 Unilateral primary osteoarthritis, left knee: Secondary | ICD-10-CM | POA: Diagnosis not present

## 2017-02-09 DIAGNOSIS — I5043 Acute on chronic combined systolic (congestive) and diastolic (congestive) heart failure: Secondary | ICD-10-CM | POA: Diagnosis not present

## 2017-02-09 DIAGNOSIS — I10 Essential (primary) hypertension: Secondary | ICD-10-CM | POA: Diagnosis not present

## 2017-02-09 DIAGNOSIS — Z7901 Long term (current) use of anticoagulants: Secondary | ICD-10-CM | POA: Diagnosis not present

## 2017-02-09 DIAGNOSIS — M1990 Unspecified osteoarthritis, unspecified site: Secondary | ICD-10-CM | POA: Diagnosis not present

## 2017-02-09 DIAGNOSIS — K264 Chronic or unspecified duodenal ulcer with hemorrhage: Secondary | ICD-10-CM | POA: Diagnosis not present

## 2017-02-09 DIAGNOSIS — K922 Gastrointestinal hemorrhage, unspecified: Secondary | ICD-10-CM | POA: Diagnosis not present

## 2017-02-09 DIAGNOSIS — Z9981 Dependence on supplemental oxygen: Secondary | ICD-10-CM | POA: Diagnosis not present

## 2017-02-09 DIAGNOSIS — I110 Hypertensive heart disease with heart failure: Secondary | ICD-10-CM | POA: Diagnosis not present

## 2017-02-09 DIAGNOSIS — I5032 Chronic diastolic (congestive) heart failure: Secondary | ICD-10-CM | POA: Diagnosis not present

## 2017-02-09 DIAGNOSIS — R2689 Other abnormalities of gait and mobility: Secondary | ICD-10-CM | POA: Diagnosis not present

## 2017-02-09 DIAGNOSIS — I4891 Unspecified atrial fibrillation: Secondary | ICD-10-CM | POA: Diagnosis not present

## 2017-02-10 ENCOUNTER — Telehealth (HOSPITAL_COMMUNITY): Payer: Self-pay | Admitting: *Deleted

## 2017-02-10 NOTE — Telephone Encounter (Signed)
Amber, PT with [facility] when to see patient yesterday for evaluation.  Amber request for verbal orders to continue to see patient 3 x's/week for a few weeks then will decrease to 2 x's/week.    Verbal orders given, no further questions.

## 2017-02-11 DIAGNOSIS — Z7901 Long term (current) use of anticoagulants: Secondary | ICD-10-CM | POA: Diagnosis not present

## 2017-02-11 DIAGNOSIS — I110 Hypertensive heart disease with heart failure: Secondary | ICD-10-CM | POA: Diagnosis not present

## 2017-02-11 DIAGNOSIS — R2689 Other abnormalities of gait and mobility: Secondary | ICD-10-CM | POA: Diagnosis not present

## 2017-02-11 DIAGNOSIS — Z9981 Dependence on supplemental oxygen: Secondary | ICD-10-CM | POA: Diagnosis not present

## 2017-02-11 DIAGNOSIS — I5032 Chronic diastolic (congestive) heart failure: Secondary | ICD-10-CM | POA: Diagnosis not present

## 2017-02-11 DIAGNOSIS — M1990 Unspecified osteoarthritis, unspecified site: Secondary | ICD-10-CM | POA: Diagnosis not present

## 2017-02-13 DIAGNOSIS — Z9981 Dependence on supplemental oxygen: Secondary | ICD-10-CM | POA: Diagnosis not present

## 2017-02-13 DIAGNOSIS — Z7901 Long term (current) use of anticoagulants: Secondary | ICD-10-CM | POA: Diagnosis not present

## 2017-02-13 DIAGNOSIS — M1990 Unspecified osteoarthritis, unspecified site: Secondary | ICD-10-CM | POA: Diagnosis not present

## 2017-02-13 DIAGNOSIS — I110 Hypertensive heart disease with heart failure: Secondary | ICD-10-CM | POA: Diagnosis not present

## 2017-02-13 DIAGNOSIS — R2689 Other abnormalities of gait and mobility: Secondary | ICD-10-CM | POA: Diagnosis not present

## 2017-02-13 DIAGNOSIS — I5032 Chronic diastolic (congestive) heart failure: Secondary | ICD-10-CM | POA: Diagnosis not present

## 2017-02-17 DIAGNOSIS — M1990 Unspecified osteoarthritis, unspecified site: Secondary | ICD-10-CM | POA: Diagnosis not present

## 2017-02-17 DIAGNOSIS — I5032 Chronic diastolic (congestive) heart failure: Secondary | ICD-10-CM | POA: Diagnosis not present

## 2017-02-17 DIAGNOSIS — I110 Hypertensive heart disease with heart failure: Secondary | ICD-10-CM | POA: Diagnosis not present

## 2017-02-17 DIAGNOSIS — Z9981 Dependence on supplemental oxygen: Secondary | ICD-10-CM | POA: Diagnosis not present

## 2017-02-17 DIAGNOSIS — R2689 Other abnormalities of gait and mobility: Secondary | ICD-10-CM | POA: Diagnosis not present

## 2017-02-17 DIAGNOSIS — Z7901 Long term (current) use of anticoagulants: Secondary | ICD-10-CM | POA: Diagnosis not present

## 2017-02-18 DIAGNOSIS — Z9981 Dependence on supplemental oxygen: Secondary | ICD-10-CM | POA: Diagnosis not present

## 2017-02-18 DIAGNOSIS — I110 Hypertensive heart disease with heart failure: Secondary | ICD-10-CM | POA: Diagnosis not present

## 2017-02-18 DIAGNOSIS — R2689 Other abnormalities of gait and mobility: Secondary | ICD-10-CM | POA: Diagnosis not present

## 2017-02-18 DIAGNOSIS — I5032 Chronic diastolic (congestive) heart failure: Secondary | ICD-10-CM | POA: Diagnosis not present

## 2017-02-18 DIAGNOSIS — M1990 Unspecified osteoarthritis, unspecified site: Secondary | ICD-10-CM | POA: Diagnosis not present

## 2017-02-18 DIAGNOSIS — Z7901 Long term (current) use of anticoagulants: Secondary | ICD-10-CM | POA: Diagnosis not present

## 2017-02-20 ENCOUNTER — Telehealth (HOSPITAL_COMMUNITY): Payer: Self-pay | Admitting: *Deleted

## 2017-02-20 DIAGNOSIS — I5032 Chronic diastolic (congestive) heart failure: Secondary | ICD-10-CM | POA: Diagnosis not present

## 2017-02-20 DIAGNOSIS — Z7901 Long term (current) use of anticoagulants: Secondary | ICD-10-CM | POA: Diagnosis not present

## 2017-02-20 DIAGNOSIS — Z9981 Dependence on supplemental oxygen: Secondary | ICD-10-CM | POA: Diagnosis not present

## 2017-02-20 DIAGNOSIS — R2689 Other abnormalities of gait and mobility: Secondary | ICD-10-CM | POA: Diagnosis not present

## 2017-02-20 DIAGNOSIS — I110 Hypertensive heart disease with heart failure: Secondary | ICD-10-CM | POA: Diagnosis not present

## 2017-02-20 DIAGNOSIS — M1990 Unspecified osteoarthritis, unspecified site: Secondary | ICD-10-CM | POA: Diagnosis not present

## 2017-02-20 NOTE — Telephone Encounter (Signed)
Pt's [place] [facility] therapist called to report that patient was feeling increased weakness today.  Her vitals were 126/100, HR 113-irregular, and O2- 92%-3L.  Patient had been up and moving prior to vitals being checked.  Looking at her previous vitals these are stable and not far off from her normal.  Patient is complaining of productive cough, more so than normal.  Her weight is down 1/2 lb according to [facility] PT and no increased sob. She will go back out to see patient on Monday and will report back if anything else changes.

## 2017-02-21 ENCOUNTER — Other Ambulatory Visit (HOSPITAL_COMMUNITY): Payer: Self-pay | Admitting: Cardiology

## 2017-02-26 DIAGNOSIS — M6281 Muscle weakness (generalized): Secondary | ICD-10-CM | POA: Diagnosis not present

## 2017-02-26 DIAGNOSIS — Z9981 Dependence on supplemental oxygen: Secondary | ICD-10-CM | POA: Diagnosis not present

## 2017-02-26 DIAGNOSIS — J9611 Chronic respiratory failure with hypoxia: Secondary | ICD-10-CM | POA: Diagnosis not present

## 2017-02-26 DIAGNOSIS — I110 Hypertensive heart disease with heart failure: Secondary | ICD-10-CM | POA: Diagnosis not present

## 2017-02-26 DIAGNOSIS — I5032 Chronic diastolic (congestive) heart failure: Secondary | ICD-10-CM | POA: Diagnosis not present

## 2017-02-26 DIAGNOSIS — M1990 Unspecified osteoarthritis, unspecified site: Secondary | ICD-10-CM | POA: Diagnosis not present

## 2017-02-26 DIAGNOSIS — R2689 Other abnormalities of gait and mobility: Secondary | ICD-10-CM | POA: Diagnosis not present

## 2017-02-26 DIAGNOSIS — I4891 Unspecified atrial fibrillation: Secondary | ICD-10-CM | POA: Diagnosis not present

## 2017-02-26 DIAGNOSIS — Z7901 Long term (current) use of anticoagulants: Secondary | ICD-10-CM | POA: Diagnosis not present

## 2017-02-26 DIAGNOSIS — I5040 Unspecified combined systolic (congestive) and diastolic (congestive) heart failure: Secondary | ICD-10-CM | POA: Diagnosis not present

## 2017-02-26 DIAGNOSIS — R51 Headache: Secondary | ICD-10-CM | POA: Diagnosis not present

## 2017-02-27 DIAGNOSIS — I110 Hypertensive heart disease with heart failure: Secondary | ICD-10-CM | POA: Diagnosis not present

## 2017-02-27 DIAGNOSIS — Z9981 Dependence on supplemental oxygen: Secondary | ICD-10-CM | POA: Diagnosis not present

## 2017-02-27 DIAGNOSIS — M1990 Unspecified osteoarthritis, unspecified site: Secondary | ICD-10-CM | POA: Diagnosis not present

## 2017-02-27 DIAGNOSIS — Z7901 Long term (current) use of anticoagulants: Secondary | ICD-10-CM | POA: Diagnosis not present

## 2017-02-27 DIAGNOSIS — I5032 Chronic diastolic (congestive) heart failure: Secondary | ICD-10-CM | POA: Diagnosis not present

## 2017-02-27 DIAGNOSIS — R2689 Other abnormalities of gait and mobility: Secondary | ICD-10-CM | POA: Diagnosis not present

## 2017-03-02 DIAGNOSIS — Z7901 Long term (current) use of anticoagulants: Secondary | ICD-10-CM | POA: Diagnosis not present

## 2017-03-02 DIAGNOSIS — R2689 Other abnormalities of gait and mobility: Secondary | ICD-10-CM | POA: Diagnosis not present

## 2017-03-02 DIAGNOSIS — Z9981 Dependence on supplemental oxygen: Secondary | ICD-10-CM | POA: Diagnosis not present

## 2017-03-02 DIAGNOSIS — I110 Hypertensive heart disease with heart failure: Secondary | ICD-10-CM | POA: Diagnosis not present

## 2017-03-02 DIAGNOSIS — I5032 Chronic diastolic (congestive) heart failure: Secondary | ICD-10-CM | POA: Diagnosis not present

## 2017-03-02 DIAGNOSIS — M1990 Unspecified osteoarthritis, unspecified site: Secondary | ICD-10-CM | POA: Diagnosis not present

## 2017-03-05 DIAGNOSIS — E785 Hyperlipidemia, unspecified: Secondary | ICD-10-CM | POA: Diagnosis present

## 2017-03-05 DIAGNOSIS — N390 Urinary tract infection, site not specified: Secondary | ICD-10-CM | POA: Diagnosis not present

## 2017-03-05 DIAGNOSIS — I10 Essential (primary) hypertension: Secondary | ICD-10-CM | POA: Diagnosis not present

## 2017-03-05 DIAGNOSIS — I482 Chronic atrial fibrillation: Secondary | ICD-10-CM | POA: Diagnosis not present

## 2017-03-05 DIAGNOSIS — I5032 Chronic diastolic (congestive) heart failure: Secondary | ICD-10-CM | POA: Diagnosis not present

## 2017-03-05 DIAGNOSIS — R6889 Other general symptoms and signs: Secondary | ICD-10-CM | POA: Diagnosis not present

## 2017-03-05 DIAGNOSIS — R748 Abnormal levels of other serum enzymes: Secondary | ICD-10-CM | POA: Diagnosis not present

## 2017-03-05 DIAGNOSIS — J9610 Chronic respiratory failure, unspecified whether with hypoxia or hypercapnia: Secondary | ICD-10-CM | POA: Diagnosis not present

## 2017-03-05 DIAGNOSIS — M6281 Muscle weakness (generalized): Secondary | ICD-10-CM | POA: Diagnosis not present

## 2017-03-05 DIAGNOSIS — R278 Other lack of coordination: Secondary | ICD-10-CM | POA: Diagnosis not present

## 2017-03-05 DIAGNOSIS — Z923 Personal history of irradiation: Secondary | ICD-10-CM | POA: Diagnosis not present

## 2017-03-05 DIAGNOSIS — Z853 Personal history of malignant neoplasm of breast: Secondary | ICD-10-CM | POA: Diagnosis not present

## 2017-03-05 DIAGNOSIS — G40A09 Absence epileptic syndrome, not intractable, without status epilepticus: Secondary | ICD-10-CM | POA: Diagnosis not present

## 2017-03-05 DIAGNOSIS — N184 Chronic kidney disease, stage 4 (severe): Secondary | ICD-10-CM | POA: Diagnosis not present

## 2017-03-05 DIAGNOSIS — E1165 Type 2 diabetes mellitus with hyperglycemia: Secondary | ICD-10-CM | POA: Diagnosis not present

## 2017-03-05 DIAGNOSIS — D638 Anemia in other chronic diseases classified elsewhere: Secondary | ICD-10-CM | POA: Diagnosis present

## 2017-03-05 DIAGNOSIS — K219 Gastro-esophageal reflux disease without esophagitis: Secondary | ICD-10-CM | POA: Diagnosis present

## 2017-03-05 DIAGNOSIS — W19XXXA Unspecified fall, initial encounter: Secondary | ICD-10-CM | POA: Diagnosis not present

## 2017-03-05 DIAGNOSIS — B9620 Unspecified Escherichia coli [E. coli] as the cause of diseases classified elsewhere: Secondary | ICD-10-CM | POA: Diagnosis present

## 2017-03-05 DIAGNOSIS — R569 Unspecified convulsions: Secondary | ICD-10-CM | POA: Diagnosis not present

## 2017-03-05 DIAGNOSIS — Z9981 Dependence on supplemental oxygen: Secondary | ICD-10-CM | POA: Diagnosis not present

## 2017-03-05 DIAGNOSIS — S22059S Unspecified fracture of T5-T6 vertebra, sequela: Secondary | ICD-10-CM | POA: Diagnosis not present

## 2017-03-05 DIAGNOSIS — E1140 Type 2 diabetes mellitus with diabetic neuropathy, unspecified: Secondary | ICD-10-CM | POA: Diagnosis present

## 2017-03-05 DIAGNOSIS — I130 Hypertensive heart and chronic kidney disease with heart failure and stage 1 through stage 4 chronic kidney disease, or unspecified chronic kidney disease: Secondary | ICD-10-CM | POA: Diagnosis not present

## 2017-03-05 DIAGNOSIS — R778 Other specified abnormalities of plasma proteins: Secondary | ICD-10-CM | POA: Diagnosis present

## 2017-03-05 DIAGNOSIS — I951 Orthostatic hypotension: Secondary | ICD-10-CM | POA: Diagnosis not present

## 2017-03-05 DIAGNOSIS — R531 Weakness: Secondary | ICD-10-CM | POA: Diagnosis not present

## 2017-03-05 DIAGNOSIS — R296 Repeated falls: Secondary | ICD-10-CM | POA: Diagnosis present

## 2017-03-05 DIAGNOSIS — E1122 Type 2 diabetes mellitus with diabetic chronic kidney disease: Secondary | ICD-10-CM | POA: Diagnosis present

## 2017-03-05 DIAGNOSIS — Z6839 Body mass index (BMI) 39.0-39.9, adult: Secondary | ICD-10-CM | POA: Diagnosis not present

## 2017-03-05 DIAGNOSIS — N179 Acute kidney failure, unspecified: Secondary | ICD-10-CM | POA: Diagnosis not present

## 2017-03-05 DIAGNOSIS — R55 Syncope and collapse: Secondary | ICD-10-CM | POA: Diagnosis not present

## 2017-03-05 DIAGNOSIS — R9401 Abnormal electroencephalogram [EEG]: Secondary | ICD-10-CM | POA: Diagnosis present

## 2017-03-05 DIAGNOSIS — E11649 Type 2 diabetes mellitus with hypoglycemia without coma: Secondary | ICD-10-CM | POA: Diagnosis present

## 2017-03-05 DIAGNOSIS — E118 Type 2 diabetes mellitus with unspecified complications: Secondary | ICD-10-CM | POA: Diagnosis not present

## 2017-03-05 DIAGNOSIS — G629 Polyneuropathy, unspecified: Secondary | ICD-10-CM | POA: Diagnosis present

## 2017-03-08 ENCOUNTER — Inpatient Hospital Stay (HOSPITAL_COMMUNITY)
Admission: EM | Admit: 2017-03-08 | Discharge: 2017-03-12 | DRG: 312 | Disposition: A | Discharge status: Skilled Nursing Facility | Payer: Medicare HMO | Attending: Family Medicine | Admitting: Family Medicine

## 2017-03-08 ENCOUNTER — Encounter (HOSPITAL_COMMUNITY): Payer: Self-pay

## 2017-03-08 ENCOUNTER — Emergency Department (HOSPITAL_COMMUNITY): Payer: Medicare HMO

## 2017-03-08 ENCOUNTER — Other Ambulatory Visit: Payer: Self-pay

## 2017-03-08 DIAGNOSIS — R6889 Other general symptoms and signs: Secondary | ICD-10-CM | POA: Diagnosis not present

## 2017-03-08 DIAGNOSIS — Z86718 Personal history of other venous thrombosis and embolism: Secondary | ICD-10-CM

## 2017-03-08 DIAGNOSIS — D638 Anemia in other chronic diseases classified elsewhere: Secondary | ICD-10-CM | POA: Diagnosis present

## 2017-03-08 DIAGNOSIS — K922 Gastrointestinal hemorrhage, unspecified: Secondary | ICD-10-CM | POA: Diagnosis not present

## 2017-03-08 DIAGNOSIS — R296 Repeated falls: Secondary | ICD-10-CM | POA: Diagnosis present

## 2017-03-08 DIAGNOSIS — B9620 Unspecified Escherichia coli [E. coli] as the cause of diseases classified elsewhere: Secondary | ICD-10-CM | POA: Diagnosis present

## 2017-03-08 DIAGNOSIS — I130 Hypertensive heart and chronic kidney disease with heart failure and stage 1 through stage 4 chronic kidney disease, or unspecified chronic kidney disease: Secondary | ICD-10-CM | POA: Diagnosis present

## 2017-03-08 DIAGNOSIS — I5043 Acute on chronic combined systolic (congestive) and diastolic (congestive) heart failure: Secondary | ICD-10-CM | POA: Diagnosis not present

## 2017-03-08 DIAGNOSIS — G40A09 Absence epileptic syndrome, not intractable, without status epilepticus: Secondary | ICD-10-CM | POA: Diagnosis not present

## 2017-03-08 DIAGNOSIS — Z9981 Dependence on supplemental oxygen: Secondary | ICD-10-CM | POA: Diagnosis not present

## 2017-03-08 DIAGNOSIS — Z6839 Body mass index (BMI) 39.0-39.9, adult: Secondary | ICD-10-CM | POA: Diagnosis not present

## 2017-03-08 DIAGNOSIS — W19XXXA Unspecified fall, initial encounter: Secondary | ICD-10-CM | POA: Diagnosis not present

## 2017-03-08 DIAGNOSIS — N179 Acute kidney failure, unspecified: Secondary | ICD-10-CM | POA: Diagnosis present

## 2017-03-08 DIAGNOSIS — E785 Hyperlipidemia, unspecified: Secondary | ICD-10-CM | POA: Diagnosis present

## 2017-03-08 DIAGNOSIS — N184 Chronic kidney disease, stage 4 (severe): Secondary | ICD-10-CM | POA: Diagnosis present

## 2017-03-08 DIAGNOSIS — Z923 Personal history of irradiation: Secondary | ICD-10-CM

## 2017-03-08 DIAGNOSIS — E1122 Type 2 diabetes mellitus with diabetic chronic kidney disease: Secondary | ICD-10-CM | POA: Diagnosis present

## 2017-03-08 DIAGNOSIS — R55 Syncope and collapse: Secondary | ICD-10-CM | POA: Diagnosis not present

## 2017-03-08 DIAGNOSIS — I482 Chronic atrial fibrillation, unspecified: Secondary | ICD-10-CM | POA: Diagnosis present

## 2017-03-08 DIAGNOSIS — R748 Abnormal levels of other serum enzymes: Secondary | ICD-10-CM | POA: Diagnosis not present

## 2017-03-08 DIAGNOSIS — I5032 Chronic diastolic (congestive) heart failure: Secondary | ICD-10-CM | POA: Diagnosis present

## 2017-03-08 DIAGNOSIS — N390 Urinary tract infection, site not specified: Secondary | ICD-10-CM | POA: Diagnosis present

## 2017-03-08 DIAGNOSIS — M6281 Muscle weakness (generalized): Secondary | ICD-10-CM | POA: Diagnosis not present

## 2017-03-08 DIAGNOSIS — J9610 Chronic respiratory failure, unspecified whether with hypoxia or hypercapnia: Secondary | ICD-10-CM | POA: Diagnosis present

## 2017-03-08 DIAGNOSIS — E118 Type 2 diabetes mellitus with unspecified complications: Secondary | ICD-10-CM | POA: Diagnosis not present

## 2017-03-08 DIAGNOSIS — E1140 Type 2 diabetes mellitus with diabetic neuropathy, unspecified: Secondary | ICD-10-CM | POA: Diagnosis present

## 2017-03-08 DIAGNOSIS — G629 Polyneuropathy, unspecified: Secondary | ICD-10-CM | POA: Diagnosis present

## 2017-03-08 DIAGNOSIS — IMO0002 Reserved for concepts with insufficient information to code with codable children: Secondary | ICD-10-CM | POA: Diagnosis present

## 2017-03-08 DIAGNOSIS — S22059S Unspecified fracture of T5-T6 vertebra, sequela: Secondary | ICD-10-CM | POA: Diagnosis not present

## 2017-03-08 DIAGNOSIS — I509 Heart failure, unspecified: Secondary | ICD-10-CM | POA: Diagnosis not present

## 2017-03-08 DIAGNOSIS — R569 Unspecified convulsions: Secondary | ICD-10-CM

## 2017-03-08 DIAGNOSIS — Z853 Personal history of malignant neoplasm of breast: Secondary | ICD-10-CM

## 2017-03-08 DIAGNOSIS — R9401 Abnormal electroencephalogram [EEG]: Secondary | ICD-10-CM | POA: Diagnosis present

## 2017-03-08 DIAGNOSIS — I10 Essential (primary) hypertension: Secondary | ICD-10-CM | POA: Diagnosis not present

## 2017-03-08 DIAGNOSIS — K264 Chronic or unspecified duodenal ulcer with hemorrhage: Secondary | ICD-10-CM | POA: Diagnosis not present

## 2017-03-08 DIAGNOSIS — E11649 Type 2 diabetes mellitus with hypoglycemia without coma: Secondary | ICD-10-CM | POA: Diagnosis present

## 2017-03-08 DIAGNOSIS — I951 Orthostatic hypotension: Principal | ICD-10-CM | POA: Diagnosis present

## 2017-03-08 DIAGNOSIS — K219 Gastro-esophageal reflux disease without esophagitis: Secondary | ICD-10-CM | POA: Diagnosis present

## 2017-03-08 DIAGNOSIS — R778 Other specified abnormalities of plasma proteins: Secondary | ICD-10-CM | POA: Diagnosis present

## 2017-03-08 DIAGNOSIS — E1165 Type 2 diabetes mellitus with hyperglycemia: Secondary | ICD-10-CM | POA: Diagnosis not present

## 2017-03-08 DIAGNOSIS — Z8249 Family history of ischemic heart disease and other diseases of the circulatory system: Secondary | ICD-10-CM

## 2017-03-08 DIAGNOSIS — R531 Weakness: Secondary | ICD-10-CM | POA: Diagnosis not present

## 2017-03-08 DIAGNOSIS — R7989 Other specified abnormal findings of blood chemistry: Secondary | ICD-10-CM | POA: Diagnosis present

## 2017-03-08 DIAGNOSIS — Z7901 Long term (current) use of anticoagulants: Secondary | ICD-10-CM

## 2017-03-08 DIAGNOSIS — G4089 Other seizures: Secondary | ICD-10-CM | POA: Diagnosis not present

## 2017-03-08 DIAGNOSIS — I4891 Unspecified atrial fibrillation: Secondary | ICD-10-CM | POA: Diagnosis not present

## 2017-03-08 HISTORY — DX: Unspecified atrial fibrillation: I48.91

## 2017-03-08 LAB — COMPREHENSIVE METABOLIC PANEL
ALT: 10 U/L — ABNORMAL LOW (ref 14–54)
ANION GAP: 16 — AB (ref 5–15)
AST: 26 U/L (ref 15–41)
Albumin: 3.8 g/dL (ref 3.5–5.0)
Alkaline Phosphatase: 78 U/L (ref 38–126)
BUN: 34 mg/dL — AB (ref 6–20)
CHLORIDE: 85 mmol/L — AB (ref 101–111)
CO2: 35 mmol/L — ABNORMAL HIGH (ref 22–32)
Calcium: 9.2 mg/dL (ref 8.9–10.3)
Creatinine, Ser: 2.4 mg/dL — ABNORMAL HIGH (ref 0.44–1.00)
GFR calc Af Amer: 20 mL/min — ABNORMAL LOW (ref >60)
GFR, EST NON AFRICAN AMERICAN: 17 mL/min — AB (ref >60)
Glucose, Bld: 187 mg/dL — ABNORMAL HIGH (ref 65–99)
POTASSIUM: 4.3 mmol/L (ref 3.5–5.1)
Sodium: 136 mmol/L (ref 135–145)
Total Bilirubin: 0.8 mg/dL (ref 0.3–1.2)
Total Protein: 7.9 g/dL (ref 6.5–8.1)

## 2017-03-08 LAB — PROTIME-INR
INR: 2.39
Prothrombin Time: 26.5 seconds — ABNORMAL HIGH (ref 11.4–15.2)

## 2017-03-08 LAB — CBC WITH DIFFERENTIAL/PLATELET
Basophils Absolute: 0 10*3/uL (ref 0.0–0.1)
Basophils Relative: 0 %
EOS PCT: 1 %
Eosinophils Absolute: 0.1 10*3/uL (ref 0.0–0.7)
HCT: 31.8 % — ABNORMAL LOW (ref 36.0–46.0)
Hemoglobin: 9.8 g/dL — ABNORMAL LOW (ref 12.0–15.0)
LYMPHS ABS: 2 10*3/uL (ref 0.7–4.0)
LYMPHS PCT: 19 %
MCH: 27.5 pg (ref 26.0–34.0)
MCHC: 30.8 g/dL (ref 30.0–36.0)
MCV: 89.3 fL (ref 78.0–100.0)
MONOS PCT: 9 %
Monocytes Absolute: 0.9 10*3/uL (ref 0.1–1.0)
Neutro Abs: 7.5 10*3/uL (ref 1.7–7.7)
Neutrophils Relative %: 71 %
PLATELETS: 190 10*3/uL (ref 150–400)
RBC: 3.56 MIL/uL — AB (ref 3.87–5.11)
RDW: 16.7 % — AB (ref 11.5–15.5)
WBC: 10.4 10*3/uL (ref 4.0–10.5)

## 2017-03-08 LAB — URINALYSIS, ROUTINE W REFLEX MICROSCOPIC
BILIRUBIN URINE: NEGATIVE
GLUCOSE, UA: 50 mg/dL — AB
Ketones, ur: NEGATIVE mg/dL
NITRITE: NEGATIVE
PH: 5 (ref 5.0–8.0)
Protein, ur: NEGATIVE mg/dL
Specific Gravity, Urine: 1.005 (ref 1.005–1.030)

## 2017-03-08 LAB — TROPONIN I: TROPONIN I: 0.04 ng/mL — AB (ref <0.03)

## 2017-03-08 LAB — CBG MONITORING, ED: Glucose-Capillary: 181 mg/dL — ABNORMAL HIGH (ref 65–99)

## 2017-03-08 MED ORDER — METOPROLOL TARTRATE 5 MG/5ML IV SOLN
5.0000 mg | Freq: Once | INTRAVENOUS | Status: AC
  Administered 2017-03-08: 5 mg via INTRAVENOUS
  Filled 2017-03-08: qty 5

## 2017-03-08 MED ORDER — SODIUM CHLORIDE 0.9 % IV BOLUS (SEPSIS)
1000.0000 mL | Freq: Once | INTRAVENOUS | Status: AC
  Administered 2017-03-08: 1000 mL via INTRAVENOUS

## 2017-03-08 NOTE — ED Notes (Signed)
Patient transported to CT 

## 2017-03-08 NOTE — ED Triage Notes (Signed)
Pt arrived via [facility] from [place] c/o generalized weakness, 3 falls today, and family reported absent seizures not previously diagnosed.

## 2017-03-08 NOTE — ED Provider Notes (Signed)
[facility] DEPT Provider Note   CSN: [id number] Arrival date & time: 03/08/17  1912     History   Chief Complaint No chief complaint on file.   HPI [name] is a 83 y.o. female presenting via EMS after a fall at the rehabilitation facility. Patient reports that she blacked out and fell she was trying to get on the commode and possibly hit her head on the left side. She denies dizziness, headache, nausea, vomiting, diarrhea, dysuria, hematuria, blood in the stool. Family is at bedside stating that she is at her normal baseline and reported that she has been having episodes that they believe for seizures where she has a blank stare unresponsive and has right hand tremors and slowly comes back very sleepy and usually has an episode of vomiting afterwards. She is scheduled to see neurology on April 11. They report that this sounds like another one of those episodes they have been witnessing but no one has diagnosed her with seizures. Patient denies any new symptoms at this time. She remembers going to the bathroom and everything going black, hitting her head and having tenderness of the left temple area after the event.  HPI  Past Medical History:  Diagnosis Date  . Anemia   . Arthritis   . Breast cancer ([place]) 2012   right  . CHF (congestive heart failure) ([place])   . Cholelithiasis   . DCIS (ductal carcinoma in situ) of breast 07/15/2010   DCIS s/p lumpectomy and radiation 2011  . Diabetes mellitus   . DVT (deep venous thrombosis) ([place])   . GERD (gastroesophageal reflux disease)   . GI bleed   . Heart murmur   . Hyperlipidemia   . Hypertension   . Peripheral neuropathy ([place])   . Shortness of breath dyspnea   . Syncope 05/09/2015    Patient Active Problem List   Diagnosis Date Noted  . Pressure injury of skin 08/26/2016  . Syncope 08/24/2016  . CKD (chronic kidney disease) stage 4, GFR 15-29 ml/min (HCC) 08/24/2016  . Thoracic compression fracture ([place]) 08/24/2016  .  Diabetes mellitus type 2, uncontrolled ([place]) 08/24/2016  . Uncontrolled type 2 diabetes mellitus with complication ([place])   . Chronic diastolic congestive heart failure ([place]) 02/28/2016  . UTI (lower urinary tract infection)   . Palliative care encounter   . Tricuspid valve regurgitation   . Pulmonary hypertensive venous disease 08/28/2015  . Normal coronary arteries-June 2016 08/12/2015  . Severe obesity (BMI >= 40) ([place]) 06/09/2015  . Anterior dislocation of left shoulder 05/19/2015  . Pulmonary hypertension, moderate to severe ([place]) 05/19/2015  . Choledocholithiasis 10/14/2012  . 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 Duodenal ulcer with hemorrhage 09/03/2012  . Calculus of bile duct without mention of cholecystitis or obstruction 09/02/2012  . Calculus of gallbladder without mention of cholecystitis or obstruction 09/02/2012  . Chronic anticoagulation-Warfarin 08/30/2012  . 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 HYPERCHOLESTEROLEMIA 02/08/2010  . Hypertensive cardiovascular disease 02/08/2010  . Chronic atrial fibrillation- CHADs VASc =6 02/08/2010  . Mitral valve regurgitation with secondary [facility] 02/08/2010    Past Surgical History:  Procedure Laterality Date  . BREAST SURGERY    . CARDIAC CATHETERIZATION N/A 05/11/2015   Procedure: Left Heart Cath and Coronary Angiography;  Surgeon: [name], MD;  Location: [facility] CV LAB;  Service: Cardiovascular;  Laterality: N/A;  . CARDIAC CATHETERIZATION N/A 08/09/2015   Procedure: Right Heart Cath;  Surgeon: [name], MD;  Location:  [facility] INVASIVE CV LAB;  Service: Cardiovascular;  Laterality: N/A;  . CATARACT EXTRACTION Left 05/08/2015  . ENDOSCOPIC RETROGRADE CHOLANGIOPANCREATOGRAPHY (ERCP) WITH PROPOFOL  10/14/2012   Procedure: ENDOSCOPIC RETROGRADE CHOLANGIOPANCREATOGRAPHY (ERCP) WITH PROPOFOL;  Surgeon: Milus Banister, MD;  Location: [facility] ENDOSCOPY;  Service: Endoscopy;  Laterality: N/A;  .  ESOPHAGOGASTRODUODENOSCOPY  09/01/2012   Procedure: ESOPHAGOGASTRODUODENOSCOPY (EGD);  Surgeon: Ladene Artist, MD,FACG;  Location: [facility] ENDOSCOPY;  Service: Endoscopy;  Laterality: N/A;  . ESOPHAGOGASTRODUODENOSCOPY  09/03/2012   Procedure: ESOPHAGOGASTRODUODENOSCOPY (EGD);  Surgeon: Inda Castle, MD;  Location: [facility];  Service: Endoscopy;  Laterality: N/A;  . EUS  10/14/2012   Procedure: UPPER ENDOSCOPIC ULTRASOUND (EUS) LINEAR;  Surgeon: Milus Banister, MD;  Location: [facility] ENDOSCOPY;  Service: Endoscopy;  Laterality: N/A;  . EUS  11/11/2012   Procedure: UPPER ENDOSCOPIC ULTRASOUND (EUS) LINEAR;  Surgeon: Milus Banister, MD;  Location: [facility] ENDOSCOPY;  Service: Endoscopy;  Laterality: N/A;    OB History    No data available       Home Medications    Prior to Admission medications   Medication Sig Start Date End Date Taking? Authorizing Provider  acetaminophen (TYLENOL) 500 MG tablet Take 1,000 mg by mouth 2 (two) times daily as needed for mild pain.    Yes Historical Provider, MD  atorvastatin (LIPITOR) 20 MG tablet TAKE 1 TABLET BY MOUTH EVERY DAY 04/14/16  Yes [name], MD  gabapentin (NEURONTIN) 300 MG capsule Take 300 mg by mouth every morning.  04/09/15  Yes Historical Provider, MD  KLOR-CON M20 20 MEQ tablet TAKE 3 TABLETS IN THE AM AND 2 TABLETS IN THE PM 02/23/17  Yes Shaune Pascal Bensimhon, MD  metolazone (ZAROXOLYN) 2.5 MG tablet Take 2.5 mg by mouth See admin instructions. Takes 1 tab on wed and sat   Yes Historical Provider, MD  metoprolol succinate (TOPROL-XL) 25 MG 24 hr tablet Take 1 tablet (25 mg total) by mouth at bedtime. Patient taking differently: Take 25 mg by mouth every morning.  01/26/17  Yes [name], MD  OXYGEN Inhale 3 L into the lungs continuous.   Yes Historical Provider, MD  pantoprazole (PROTONIX) 40 MG tablet Take 1 tablet (40 mg total) by mouth 2 (two) times daily before a meal. Patient taking differently: Take 40 mg by mouth daily.  09/07/12  Yes  [name], MD  potassium chloride SA (K-DUR,KLOR-CON) 20 MEQ tablet Take 2 tablets (40 mEq total) by mouth daily. Patient taking differently: Take 40 mEq by mouth every evening.  01/26/17  Yes [name], MD  potassium chloride SA (K-DUR,KLOR-CON) 20 MEQ tablet Take 60 mEq by mouth every morning.   Yes Historical Provider, MD  torsemide (DEMADEX) 20 MG tablet Take 80 mg (4 tabs) in AM and 40 mg (2 tabs) in PM. Patient taking differently: Take 40-80 mg by mouth See admin instructions. Take 80 mg (4 tabs) in AM and 40 mg (2 tabs) in PM. 01/26/17  Yes [name], MD  warfarin (COUMADIN) 2 MG tablet Take 3 mg by mouth daily.     Historical Provider, MD    Family History History reviewed. No pertinent family history.  Social History Social History  Substance Use Topics  . Smoking status: Never Smoker  . Smokeless tobacco: Never Used  . Alcohol use No     Allergies   Peach [prunus persica]   Review of Systems Review of Systems  Constitutional: Negative for chills and fever.  HENT: Negative  for congestion, ear pain and sore throat.   Eyes: Negative for pain and visual disturbance.  Respiratory: Positive for cough. Negative for choking, chest tightness, shortness of breath, wheezing and stridor.        Patient reports a chronic cough  Cardiovascular: Negative for chest pain, palpitations and leg swelling.  Gastrointestinal: Negative for abdominal distention, abdominal pain, blood in stool, diarrhea, nausea and vomiting.  Genitourinary: Negative for dysuria and hematuria.  Musculoskeletal: Positive for arthralgias. Negative for back pain, joint swelling, myalgias, neck pain and neck stiffness.       Chronic bilateral knee pain worse on the right  Skin: Negative for color change, pallor and rash.  Neurological: Positive for syncope. Negative for dizziness, tremors, facial asymmetry, speech difficulty, weakness, light-headedness, numbness and headaches.     Physical  Exam Updated Vital Signs BP 113/80 (BP Location: Right Arm)   Pulse 82   Temp 97.6 F (36.4 C) (Oral)   Resp (!) 21   Ht 5\' 4"  (1.626 m)   Wt 103.4 kg   SpO2 100%   BMI 39.14 kg/m   Physical Exam  Constitutional: She is oriented to person, place, and time. She appears well-developed and well-nourished. No distress.  HENT:  Head: Normocephalic and atraumatic.  Mouth/Throat: Oropharynx is clear and moist. No oropharyngeal exudate.  Eyes: Conjunctivae and EOM are normal. Pupils are equal, round, and reactive to light. Right eye exhibits no discharge. Left eye exhibits no discharge. No scleral icterus.  Neck: Normal range of motion. Neck supple.  Cardiovascular: Normal rate, regular rhythm, normal heart sounds and intact distal pulses.   No murmur heard. Pulmonary/Chest: Effort normal and breath sounds normal. No respiratory distress. She has no wheezes. She has no rales. She exhibits no tenderness.  Abdominal: Soft. Bowel sounds are normal. She exhibits no distension and no mass. There is no tenderness. There is no rebound and no guarding.  Musculoskeletal: She exhibits no edema.  Neurological: She is alert and oriented to person, place, and time. No cranial nerve deficit or sensory deficit. She exhibits normal muscle tone. Coordination normal.  Neurologic Exam:   - Mental status: Patient is alert and cooperative. Fluent speech and words are clear. Coherent thought processes and insight is good. Patient is oriented x 4 to person, place, time and event.   - Cranial nerves:  CN III, IV, VI: pupils equally round, reactive to light both direct and conscensual and normal accommodation. Full extra-ocular movement. CN V: motor temporalis and masseter strength intact. CN VII : muscles of facial expression intact. CN X :  midline uvula. XI strength of sternocleidomastoid and trapezius muscles 5/5, XII: tongue is midline when protruded.  - Motor: No involuntary movements. Muscle tone and bulk  normal throughout. Muscle strength is 5/5 in bilateral shoulder abduction, elbow flexion and extension, wrist flexion and extension, thumb opposition, grip, hip  flexion,  ankle dorsiflexion and plantar flexion.   - Sensory: Proprioception, light tough sensation intact in all extremities.   - Cerebellar: rapid alternating movements and point to point movement intact in upper extremities.  Skin: Skin is warm and dry. Capillary refill takes less than 2 seconds. No rash noted. She is not diaphoretic. No erythema. No pallor.  Psychiatric: She has a normal mood and affect.  Nursing note and vitals reviewed.    ED Treatments / Results  Labs (all labs ordered are listed, but only abnormal results are displayed) Labs Reviewed  PROTIME-INR - Abnormal; Notable for the following:  Result Value   Prothrombin Time 26.5 (*)    All other components within normal limits  CBC WITH DIFFERENTIAL/PLATELET - Abnormal; Notable for the following:    RBC 3.56 (*)    Hemoglobin 9.8 (*)    HCT 31.8 (*)    RDW 16.7 (*)    All other components within normal limits  COMPREHENSIVE METABOLIC PANEL - Abnormal; Notable for the following:    Chloride 85 (*)    CO2 35 (*)    Glucose, Bld 187 (*)    BUN 34 (*)    Creatinine, Ser 2.40 (*)    ALT 10 (*)    GFR calc non Af Amer 17 (*)    GFR calc Af Amer 20 (*)    Anion gap 16 (*)    All other components within normal limits  TROPONIN I - Abnormal; Notable for the following:    Troponin I 0.04 (*)    All other components within normal limits  URINALYSIS, ROUTINE W REFLEX MICROSCOPIC - Abnormal; Notable for the following:    Color, Urine STRAW (*)    Glucose, UA 50 (*)    Hgb urine dipstick SMALL (*)    Leukocytes, UA MODERATE (*)    Bacteria, UA RARE (*)    Squamous Epithelial / LPF 0-5 (*)    All other components within normal limits  CBG MONITORING, ED - Abnormal; Notable for the following:    Glucose-Capillary 181 (*)    All other components within  normal limits  TROPONIN I  POCT CBG (FASTING - GLUCOSE)-MANUAL ENTRY    EKG  EKG Interpretation  Date/Time:  Sunday March 08 2017 21:56:43 EDT Ventricular Rate:  125 PR Interval:    QRS Duration: 73 QT Interval:  320 QTC Calculation: 462 R Axis:   24 Text Interpretation:  Atrial fibrillation Repolarization abnormality, prob rate related Since last tracing rate faster Confirmed by KNAPP  MD-J, JON ([id number]) on 03/08/2017 10:00:42 PM       Radiology Dg Chest 2 View  Result Date: 03/08/2017 CLINICAL DATA:  Syncope.  Head trauma EXAM: CHEST  2 VIEW COMPARISON:  08/24/16 FINDINGS: There is moderate cardiac enlargement. Aortic atherosclerosis. Small left pleural effusion identified. Pulmonary vascular congestion is identified. No airspace opacities. IMPRESSION: 1. Cardiac enlargement aortic atherosclerosis. 2. Small left pleural effusion pulmonary vascular congestion Electronically Signed   By: Kerby Moors M.D.   On: 03/08/2017 21:52   Ct Head Wo Contrast  Result Date: 03/08/2017 CLINICAL DATA:  Generalized weakness.  Fall. EXAM: CT HEAD WITHOUT CONTRAST TECHNIQUE: Contiguous axial images were obtained from the base of the skull through the vertex without intravenous contrast. COMPARISON:  05/19/2015 FINDINGS: Brain: No evidence of acute infarction, hemorrhage, hydrocephalus, extra-axial collection or mass lesion/mass effect. There is patchy low attenuation throughout the subcortical and periventricular white matter compatible with chronic small vessel ischemic disease. Prominence of the sulci and ventricles noted compatible with brain atrophy. Vascular: No hyperdense vessel or unexpected calcification. Skull: Normal. Negative for fracture or focal lesion. Sinuses/Orbits: The paranasal sinuses are clear. There is partial opacification of the right mastoid air cells. Other: None. IMPRESSION: 1. Small vessel ischemic disease brain atrophy. 2. No acute intracranial abnormalities. Electronically Signed    By: Kerby Moors M.D.   On: 03/08/2017 22:00    Procedures Procedures (including critical care time)  Medications Ordered in ED Medications  metoprolol (LOPRESSOR) injection 5 mg (5 mg Intravenous Given 03/08/17 2227)  sodium chloride 0.9 % bolus 1,000 mL (1,000  mLs Intravenous New Bag/Given 03/08/17 2227)     Initial Impression / Assessment and Plan / ED Course  I have reviewed the triage vital signs and the nursing notes.  Pertinent labs & imaging results that were available during my care of the patient were reviewed by me and considered in my medical decision making (see chart for details).    Patient with history of A. fib not currently on Coumadin presenting with a syncopal episode while attempting to sit on the commode. Family report increased frequency of blank stare episodes in which she was unresponsive with right hand tremors and subsequent nausea vomiting and slow to return to full awareness. Daughters at bedside supplementing history. They have been concerned for some time that she may be having seizures but undiagnosed. She has an appointment with neurology April 11. They report that the episodes have been more frequent recently with 2 episodes 20 minutes apart today. Family was contacted from rehab twice today but did not witness these episodes. Patient describes the episodes as everything going dark, losing her vision and falling. Normal neuro exam, Unremarkable CT head  Patient given IV fluids and home metoprolol due to elevated heart rate.   Consult placed for admission. Spoke to Dr. Tamala Julian who recommended consult to Neurology prior to admission.  Consulted neurology, spoke to Dr. Cheral Marker who will be coming in to see patient in ED. He told patient that she needed to come in tonight as he was concerned that she may have another episode.  Spoke to Dr. Tamala Julian who will be admitting patient to telemetry.   Final Clinical Impressions(s) / ED Diagnoses   Final diagnoses:   Syncope, unspecified syncope type    New Prescriptions New Prescriptions   No medications on file     Dossie Der 03/09/17 0111    Dorie Rank, MD 03/09/17 2219

## 2017-03-09 ENCOUNTER — Observation Stay (HOSPITAL_COMMUNITY): Payer: Medicare HMO

## 2017-03-09 ENCOUNTER — Encounter (HOSPITAL_COMMUNITY): Payer: Self-pay | Admitting: Internal Medicine

## 2017-03-09 DIAGNOSIS — R569 Unspecified convulsions: Secondary | ICD-10-CM | POA: Diagnosis not present

## 2017-03-09 DIAGNOSIS — W19XXXA Unspecified fall, initial encounter: Secondary | ICD-10-CM | POA: Diagnosis present

## 2017-03-09 DIAGNOSIS — R296 Repeated falls: Secondary | ICD-10-CM | POA: Diagnosis present

## 2017-03-09 DIAGNOSIS — G40A09 Absence epileptic syndrome, not intractable, without status epilepticus: Secondary | ICD-10-CM

## 2017-03-09 DIAGNOSIS — R778 Other specified abnormalities of plasma proteins: Secondary | ICD-10-CM | POA: Diagnosis present

## 2017-03-09 DIAGNOSIS — R55 Syncope and collapse: Secondary | ICD-10-CM | POA: Diagnosis not present

## 2017-03-09 DIAGNOSIS — R7989 Other specified abnormal findings of blood chemistry: Secondary | ICD-10-CM

## 2017-03-09 DIAGNOSIS — N179 Acute kidney failure, unspecified: Secondary | ICD-10-CM

## 2017-03-09 DIAGNOSIS — I951 Orthostatic hypotension: Principal | ICD-10-CM

## 2017-03-09 LAB — BASIC METABOLIC PANEL
Anion gap: 15 (ref 5–15)
BUN: 33 mg/dL — ABNORMAL HIGH (ref 6–20)
CALCIUM: 9 mg/dL (ref 8.9–10.3)
CO2: 37 mmol/L — ABNORMAL HIGH (ref 22–32)
Chloride: 86 mmol/L — ABNORMAL LOW (ref 101–111)
Creatinine, Ser: 2.17 mg/dL — ABNORMAL HIGH (ref 0.44–1.00)
GFR, EST AFRICAN AMERICAN: 22 mL/min — AB (ref >60)
GFR, EST NON AFRICAN AMERICAN: 19 mL/min — AB (ref >60)
Glucose, Bld: 193 mg/dL — ABNORMAL HIGH (ref 65–99)
Potassium: 3.6 mmol/L (ref 3.5–5.1)
SODIUM: 138 mmol/L (ref 135–145)

## 2017-03-09 LAB — TSH: TSH: 2.132 u[IU]/mL (ref 0.350–4.500)

## 2017-03-09 LAB — CBC
HCT: 30.4 % — ABNORMAL LOW (ref 36.0–46.0)
HEMOGLOBIN: 9.2 g/dL — AB (ref 12.0–15.0)
MCH: 27.1 pg (ref 26.0–34.0)
MCHC: 30.3 g/dL (ref 30.0–36.0)
MCV: 89.4 fL (ref 78.0–100.0)
Platelets: 177 10*3/uL (ref 150–400)
RBC: 3.4 MIL/uL — ABNORMAL LOW (ref 3.87–5.11)
RDW: 16.9 % — AB (ref 11.5–15.5)
WBC: 8.2 10*3/uL (ref 4.0–10.5)

## 2017-03-09 LAB — BRAIN NATRIURETIC PEPTIDE: B NATRIURETIC PEPTIDE 5: 278.8 pg/mL — AB (ref 0.0–100.0)

## 2017-03-09 LAB — TROPONIN I
TROPONIN I: 0.03 ng/mL — AB (ref <0.03)
TROPONIN I: 0.03 ng/mL — AB (ref <0.03)

## 2017-03-09 MED ORDER — TORSEMIDE 20 MG PO TABS
80.0000 mg | ORAL_TABLET | Freq: Every day | ORAL | Status: DC
  Administered 2017-03-09: 80 mg via ORAL
  Filled 2017-03-09: qty 4

## 2017-03-09 MED ORDER — TORSEMIDE 20 MG PO TABS: 40.0000 mg | ORAL_TABLET | Freq: Every day | ORAL | Status: DC

## 2017-03-09 MED ORDER — DEXTROSE 5 % IV SOLN
1.0000 g | INTRAVENOUS | Status: DC
  Administered 2017-03-09 – 2017-03-12 (×4): 1 g via INTRAVENOUS
  Filled 2017-03-09 (×4): qty 10

## 2017-03-09 MED ORDER — TORSEMIDE 20 MG PO TABS: 40.0000 mg | ORAL_TABLET | ORAL | Status: DC

## 2017-03-09 MED ORDER — POTASSIUM CHLORIDE CRYS ER 20 MEQ PO TBCR
40.0000 meq | EXTENDED_RELEASE_TABLET | Freq: Every evening | ORAL | Status: DC
  Administered 2017-03-09: 40 meq via ORAL
  Filled 2017-03-09: qty 2

## 2017-03-09 MED ORDER — ALBUTEROL SULFATE (2.5 MG/3ML) 0.083% IN NEBU: 2.5000 mg | INHALATION_SOLUTION | RESPIRATORY_TRACT | Status: DC | PRN

## 2017-03-09 MED ORDER — PANTOPRAZOLE SODIUM 40 MG PO TBEC
40.0000 mg | DELAYED_RELEASE_TABLET | Freq: Two times a day (BID) | ORAL | Status: DC
  Administered 2017-03-09 – 2017-03-12 (×7): 40 mg via ORAL
  Filled 2017-03-09 (×7): qty 1

## 2017-03-09 MED ORDER — POTASSIUM CHLORIDE CRYS ER 20 MEQ PO TBCR
60.0000 meq | EXTENDED_RELEASE_TABLET | Freq: Every day | ORAL | Status: DC
  Administered 2017-03-09 – 2017-03-10 (×2): 60 meq via ORAL
  Filled 2017-03-09 (×2): qty 3

## 2017-03-09 MED ORDER — ATORVASTATIN CALCIUM 20 MG PO TABS
20.0000 mg | ORAL_TABLET | Freq: Every day | ORAL | Status: DC
  Administered 2017-03-09 – 2017-03-12 (×4): 20 mg via ORAL
  Filled 2017-03-09 (×4): qty 1

## 2017-03-09 MED ORDER — SODIUM CHLORIDE 0.9% FLUSH
3.0000 mL | Freq: Two times a day (BID) | INTRAVENOUS | Status: DC
  Administered 2017-03-09 – 2017-03-12 (×7): 3 mL via INTRAVENOUS

## 2017-03-09 MED ORDER — ACETAMINOPHEN 650 MG RE SUPP: 650.0000 mg | Freq: Four times a day (QID) | RECTAL | Status: DC | PRN

## 2017-03-09 MED ORDER — WARFARIN SODIUM 3 MG PO TABS
3.0000 mg | ORAL_TABLET | Freq: Every day | ORAL | Status: DC
  Administered 2017-03-09 – 2017-03-12 (×4): 3 mg via ORAL
  Filled 2017-03-09 (×4): qty 1

## 2017-03-09 MED ORDER — WARFARIN - PHARMACIST DOSING INPATIENT
Freq: Every day | Status: DC
  Administered 2017-03-09 – 2017-03-11 (×2)

## 2017-03-09 MED ORDER — TORSEMIDE 20 MG PO TABS
80.0000 mg | ORAL_TABLET | Freq: Every day | ORAL | Status: DC
  Administered 2017-03-10: 80 mg via ORAL
  Filled 2017-03-09: qty 4

## 2017-03-09 MED ORDER — ACETAMINOPHEN 325 MG PO TABS: 650.0000 mg | ORAL_TABLET | Freq: Four times a day (QID) | ORAL | Status: DC | PRN

## 2017-03-09 MED ORDER — METOPROLOL SUCCINATE ER 25 MG PO TB24
25.0000 mg | ORAL_TABLET | Freq: Every day | ORAL | Status: DC
  Administered 2017-03-09 – 2017-03-10 (×2): 25 mg via ORAL
  Filled 2017-03-09 (×2): qty 1

## 2017-03-09 MED ORDER — METOLAZONE 2.5 MG PO TABS: 2.5000 mg | ORAL_TABLET | ORAL | Status: DC

## 2017-03-09 MED ORDER — GABAPENTIN 300 MG PO CAPS
300.0000 mg | ORAL_CAPSULE | Freq: Every day | ORAL | Status: DC
  Administered 2017-03-09 – 2017-03-12 (×4): 300 mg via ORAL
  Filled 2017-03-09 (×4): qty 1

## 2017-03-09 NOTE — Procedures (Signed)
ELECTROENCEPHALOGRAM REPORT  Date of Study: 03/09/2017  Patient's Name: [name] MRN: [id number] Date of Birth: 1926-01-28  Referring Provider: Dr. Kerney Elbe  Clinical History: This is an 83 year old woman with recurrent falls and absence-like spells.   Medications: acetaminophen (TYLENOL) tablet 650 mg  albuterol (PROVENTIL) (2.5 MG/3ML) 0.083% nebulizer solution 2.5 mg  atorvastatin (LIPITOR) tablet 20 mg  cefTRIAXone (ROCEPHIN) 1 g in dextrose 5 % 50 mL IVPB  gabapentin (NEURONTIN) capsule 300 mg metolazone (ZAROXOLYN) tablet 2.5 mg  metoprolol succinate (TOPROL-XL) 24 hr tablet 25 mg  pantoprazole (PROTONIX) EC tablet 40 mg  potassium chloride SA (K-DUR,KLOR-CON) CR tablet 40 mEq  potassium chloride SA (K-DUR,KLOR-CON) CR tablet 60 mEq  sodium chloride flush (NS) 0.9 % injection 3 mL  torsemide (DEMADEX) tablet 40 mg  torsemide (DEMADEX) tablet 80 mg  warfarin (COUMADIN) tablet 3 mg   Technical Summary: A multichannel digital EEG recording measured by the international 10-20 system with electrodes applied with paste and impedances below 5000 ohms performed as portable with EKG monitoring in an awake and asleep patient.  Hyperventilation and photic stimulation were not performed.  The digital EEG was referentially recorded, reformatted, and digitally filtered in a variety of bipolar and referential montages for optimal display.   Description: The patient is awake and asleep during the recording.  During maximal wakefulness, there is a symmetric, medium voltage 8 Hz posterior dominant rhythm that attenuates with eye opening. This is admixed with a small amount of diffuse 4-5 Hz theta and 2-3 Hz delta slowing of the waking background.  During drowsiness and sleep, there is an increase in theta and delta slowing of the background with occasional vertex waves seen. Hyperventilation and photic stimulation were not performed. There were no epileptiform discharges or  electrographic seizures seen.    EKG lead showed irregular rhythm.   Impression: This awake and asleep EEG is abnormal due to mild to moderate diffuse slowing of the waking background.  Clinical Correlation of the above findings indicates diffuse cerebral dysfunction that is non-specific in etiology and can be seen with hypoxic/ischemic injury, toxic/metabolic encephalopathies, neurodegenerative disorders, or medication effect.  The absence of epileptiform discharges does not rule out a clinical diagnosis of epilepsy.  Clinical correlation is advised.   Ellouise Newer, M.D.

## 2017-03-09 NOTE — H&P (Addendum)
History and Physical    [name] ALP:[id number] DOB: Oct 06, 1926 DOA: 03/08/2017  Referring MD/NP/PA: Avie Echevaria, PA PCP: Maximino Greenland, MD  Patient coming from: [facility] healthcare via EMS  Chief Complaint: Falls  HPI: [name] is a 83 y.o. female with medical history significant of diastolic CHF, MVR, PAH, HLD, oxygen dependent on 3 L, syncope, CKD stage IV; who presents after having 3 falls yesterday at [place]. Much of history is obtained from report.  Patient notes that she fell and hit her head on the left side while trying to get on the commode. She is not able to recall all 3 falls. She states that she gets very weak when ever she is standing for too long. Associated symptoms include shortness of breath with exertion and left leg pain following the fall. Family gives report of patient having staring spells where she will have a blank stare and be unresponsive and have right hand shaking. Thereafter patient has been known to have vomiting and be drowsy for a period of time prior to returning back to her normal self. Per the family these episodes have been increasing in frequency. Patient denies having any headache, nausea, chest pain, focal weakness, or change in vision. Patient is scheduled to see a neurologist on April 11 due to the question of these seizure like episodes.  ED Course: Upon admission into the emergency department upon admission into the emergency department patient was seen to be afebrile, heart rates 45-109, respirations 16-27, blood pressure as low as 82/64, and O2 saturation maintained on 3 L nasal cannula oxygen.  Patient noted have 1 brief episode of unresponsiveness with right hand shaking while in the ED. Neurology was consulted to see the patient.   Review of Systems: As per HPI otherwise 10 point review of systems negative.   Past Medical History:  Diagnosis Date  . Anemia   . Arthritis   . Breast cancer ([place]) 2012   right    . CHF (congestive heart failure) ([place])   . Cholelithiasis   . DCIS (ductal carcinoma in situ) of breast 07/15/2010   DCIS s/p lumpectomy and radiation 2011  . Diabetes mellitus   . DVT (deep venous thrombosis) ([place])   . GERD (gastroesophageal reflux disease)   . GI bleed   . Heart murmur   . Hyperlipidemia   . Hypertension   . Peripheral neuropathy ([place])   . Shortness of breath dyspnea   . Syncope 05/09/2015    Past Surgical History:  Procedure Laterality Date  . BREAST SURGERY    . CARDIAC CATHETERIZATION N/A 05/11/2015   Procedure: Left Heart Cath and Coronary Angiography;  Surgeon: [name], MD;  Location: [facility] [facility];  Service: Cardiovascular;  Laterality: N/A;  . CARDIAC CATHETERIZATION N/A 08/09/2015   Procedure: Right Heart Cath;  Surgeon: [name], MD;  Location: [facility];  Service: Cardiovascular;  Laterality: N/A;  . CATARACT EXTRACTION Left 05/08/2015  . ENDOSCOPIC RETROGRADE CHOLANGIOPANCREATOGRAPHY (ERCP) WITH PROPOFOL  10/14/2012   Procedure: ENDOSCOPIC RETROGRADE CHOLANGIOPANCREATOGRAPHY (ERCP) WITH PROPOFOL;  Surgeon: Milus Banister, MD;  Location: [facility];  Service: Endoscopy;  Laterality: N/A;  . ESOPHAGOGASTRODUODENOSCOPY  09/01/2012   Procedure: ESOPHAGOGASTRODUODENOSCOPY (EGD);  Surgeon: Ladene Artist, MD,FACG;  Location: [facility];  Service: Endoscopy;  Laterality: N/A;  . ESOPHAGOGASTRODUODENOSCOPY  09/03/2012   Procedure: ESOPHAGOGASTRODUODENOSCOPY (EGD);  Surgeon: Inda Castle, MD;  Location: [facility] [place];  Service: Endoscopy;  Laterality: N/A;  . EUS  10/14/2012   Procedure: UPPER ENDOSCOPIC ULTRASOUND (EUS) LINEAR;  Surgeon: Milus Banister, MD;  Location: [facility];  Service: Endoscopy;  Laterality: N/A;  . EUS  11/11/2012   Procedure: UPPER ENDOSCOPIC ULTRASOUND (EUS) LINEAR;  Surgeon: Milus Banister, MD;  Location: [facility];  Service: Endoscopy;  Laterality: N/A;     reports that she has never smoked. She has  never used smokeless tobacco. She reports that she does not drink alcohol or use drugs.  Allergies  Allergen Reactions  . Peach [Prunus Persica] Rash    AGENT: peach fuzz    History reviewed. No pertinent family history.  Prior to Admission medications   Medication Sig Start Date End Date Taking? Authorizing Provider  acetaminophen (TYLENOL) 500 MG tablet Take 1,000 mg by mouth 2 (two) times daily as needed for mild pain.    Yes Historical Provider, MD  atorvastatin (LIPITOR) 20 MG tablet TAKE 1 TABLET BY MOUTH EVERY DAY 04/14/16  Yes [name], MD  gabapentin (NEURONTIN) 300 MG capsule Take 300 mg by mouth every morning.  04/09/15  Yes Historical Provider, MD  KLOR-CON M20 20 MEQ tablet TAKE 3 TABLETS IN THE AM AND 2 TABLETS IN THE PM 02/23/17  Yes Shaune Pascal Bensimhon, MD  metolazone (ZAROXOLYN) 2.5 MG tablet Take 2.5 mg by mouth See admin instructions. Takes 1 tab on wed and sat   Yes Historical Provider, MD  metoprolol succinate (TOPROL-XL) 25 MG 24 hr tablet Take 1 tablet (25 mg total) by mouth at bedtime. Patient taking differently: Take 25 mg by mouth every morning.  01/26/17  Yes [name], MD  OXYGEN Inhale 3 L into the lungs continuous.   Yes Historical Provider, MD  pantoprazole (PROTONIX) 40 MG tablet Take 1 tablet (40 mg total) by mouth 2 (two) times daily before a meal. Patient taking differently: Take 40 mg by mouth daily.  09/07/12  Yes [name], MD  potassium chloride SA (K-DUR,KLOR-CON) 20 MEQ tablet Take 2 tablets (40 mEq total) by mouth daily. Patient taking differently: Take 40 mEq by mouth every evening.  01/26/17  Yes [name], MD  potassium chloride SA (K-DUR,KLOR-CON) 20 MEQ tablet Take 60 mEq by mouth every morning.   Yes Historical Provider, MD  torsemide (DEMADEX) 20 MG tablet Take 80 mg (4 tabs) in AM and 40 mg (2 tabs) in PM. Patient taking differently: Take 40-80 mg by mouth See admin instructions. Take 80 mg (4 tabs) in AM and 40 mg (2 tabs) in  PM. 01/26/17  Yes [name], MD  warfarin (COUMADIN) 2 MG tablet Take 3 mg by mouth daily.     Historical Provider, MD    Physical Exam:    Constitutional: Elderly female who appears in no acute distress at this time Vitals:   03/09/17 0022 03/09/17 0024 03/09/17 0040 03/09/17 0100  BP: (!) 82/64 113/80 113/80 134/88  Pulse: (!) 45  82 (!) 105  Resp: 19 20 (!) 21 (!) 24  Temp:   97.6 F (36.4 C)   TempSrc:   Oral   SpO2: 94%  100% 100%  Weight:      Height:       Eyes: PERRL, lids and conjunctivae normal ENMT: Mucous membranes are moist. Posterior pharynx clear of any exudate or lesions. Neck: normal, supple, no masses, no thyromegaly Respiratory: clear to auscultation bilaterally, no wheezing, no crackles. Normal respiratory effort. No accessory muscle use.  Cardiovascular: Regular rate and rhythm, no murmurs /  rubs / gallops. No extremity edema. 2+ pedal pulses. No carotid bruits.  Abdomen: no tenderness, no masses palpated. No hepatosplenomegaly. Bowel sounds positive.  Musculoskeletal: no clubbing / cyanosis. No joint deformity upper and lower extremities. Good ROM, no contractures. Normal muscle tone.  Skin: no rashes, lesions, ulcers. No induration Neurologic: CN 2-12 grossly intact. Able to move all extremities Psychiatric: Normal judgment and insight. Alert and oriented x 3. Normal mood.     Labs on Admission: I have personally reviewed following labs and imaging studies  CBC:  Recent Labs Lab 03/08/17 2100  WBC 10.4  NEUTROABS 7.5  HGB 9.8*  HCT 31.8*  MCV 89.3  PLT 212   Basic Metabolic Panel:  Recent Labs Lab 03/08/17 2100  NA 136  K 4.3  CL 85*  CO2 35*  GLUCOSE 187*  BUN 34*  CREATININE 2.40*  CALCIUM 9.2   GFR: Estimated Creatinine Clearance: 19 mL/min (A) (by C-G formula based on SCr of 2.4 mg/dL (H)). Liver Function Tests:  Recent Labs Lab 03/08/17 2100  AST 26  ALT 10*  ALKPHOS 78  BILITOT 0.8  PROT 7.9  ALBUMIN 3.8    No results for input(s): LIPASE, AMYLASE in the last 168 hours. No results for input(s): AMMONIA in the last 168 hours. Coagulation Profile:  Recent Labs Lab 03/08/17 2100  INR 2.39   Cardiac Enzymes:  Recent Labs Lab 03/08/17 2100  TROPONINI 0.04*   BNP (last 3 results) No results for input(s): PROBNP in the last 8760 hours. HbA1C: No results for input(s): HGBA1C in the last 72 hours. CBG:  Recent Labs Lab 03/08/17 2156  GLUCAP 181*   Lipid Profile: No results for input(s): CHOL, HDL, LDLCALC, TRIG, CHOLHDL, LDLDIRECT in the last 72 hours. Thyroid Function Tests: No results for input(s): TSH, T4TOTAL, FREET4, T3FREE, THYROIDAB in the last 72 hours. Anemia Panel: No results for input(s): VITAMINB12, FOLATE, FERRITIN, TIBC, IRON, RETICCTPCT in the last 72 hours. Urine analysis:    Component Value Date/Time   COLORURINE STRAW (A) 03/08/2017 2159   APPEARANCEUR CLEAR 03/08/2017 2159   LABSPEC 1.005 03/08/2017 2159   PHURINE 5.0 03/08/2017 2159   GLUCOSEU 50 (A) 03/08/2017 2159   HGBUR SMALL (A) 03/08/2017 2159   BILIRUBINUR NEGATIVE 03/08/2017 2159   [place] NEGATIVE 03/08/2017 2159   PROTEINUR NEGATIVE 03/08/2017 2159   UROBILINOGEN 0.2 08/31/2015 1035   NITRITE NEGATIVE 03/08/2017 2159   LEUKOCYTESUR MODERATE (A) 03/08/2017 2159   Sepsis Labs: No results found for this or any previous visit (from the past 240 hour(s)).   Radiological Exams on Admission: Dg Chest 2 View  Result Date: 03/08/2017 CLINICAL DATA:  Syncope.  Head trauma EXAM: CHEST  2 VIEW COMPARISON:  08/24/16 FINDINGS: There is moderate cardiac enlargement. Aortic atherosclerosis. Small left pleural effusion identified. Pulmonary vascular congestion is identified. No airspace opacities. IMPRESSION: 1. Cardiac enlargement aortic atherosclerosis. 2. Small left pleural effusion pulmonary vascular congestion Electronically Signed   By: Kerby Moors M.D.   On: 03/08/2017 21:52   Ct Head Wo  Contrast  Result Date: 03/08/2017 CLINICAL DATA:  Generalized weakness.  Fall. EXAM: CT HEAD WITHOUT CONTRAST TECHNIQUE: Contiguous axial images were obtained from the base of the skull through the vertex without intravenous contrast. COMPARISON:  05/19/2015 FINDINGS: Brain: No evidence of acute infarction, hemorrhage, hydrocephalus, extra-axial collection or mass lesion/mass effect. There is patchy low attenuation throughout the subcortical and periventricular white matter compatible with chronic small vessel ischemic disease. Prominence of the sulci and ventricles noted  compatible with brain atrophy. Vascular: No hyperdense vessel or unexpected calcification. Skull: Normal. Negative for fracture or focal lesion. Sinuses/Orbits: The paranasal sinuses are clear. There is partial opacification of the right mastoid air cells. Other: None. IMPRESSION: 1. Small vessel ischemic disease brain atrophy. 2. No acute intracranial abnormalities. Electronically Signed   By: Kerby Moors M.D.   On: 03/08/2017 22:00    EKG: Independently reviewed. Atrial fibrillation at 125 bpm  Assessment/Plan Frequent falls 2/2 suspected syncope and collapse - Admit to telemetry bed - check TSH - Recheck orthostatic vital signs in a.m.  Seizure-like activity - Seizure precautions - monitoring for seizure-like activity  - Consulted neuro, follow-up for further recommendations  Urinary tract infection: Acute. - Check urine culture - Rocephin IV  Elevated troponin: Initial troponin to be 0.04 on admission. - Trend cardiac troponins  Diastolic CHF  - Strict ins and outs and daily weights - Continue metoprolol, torsemide, and metolazone  Atrial fibrillation on chronic anticoagulation: chadsvasc =6. INR therapeutic at 2.39. Patient appears to have a very high HAS-BLED score as well. - Coumadin per pharmacy  Essential hypertension - Continue medications as seen above  Chronic respiratory failure on 3 L - Continue  nasal cannula oxygen  Anemia of chronic disease: Hemoglobin 9.8 which appears higher than patient's baseline hemoglobin of 8.4-8.9. This could be suggestive of hemoconcentration. - Recheck in a.m.  Acute kidney injury on chronic kidney disease stage IV: Initial creatinine noted be 2.4 on admission with a BUN of 34. Baseline creatinine seen previously to be around 1.49-2.18. - Recheck Cr in a.m,  GERD - Continue Protonix  - Patient appears to be getting certain meds incorrectly per [facility]  DVT prophylaxis: On Coumadin Code Status: full  Family Communication: No family present at bedside Disposition Plan: Likely discharge back to rehabilitation when medically stable Consults called: Neurology Admission status: Observation  Norval Morton MD [facility] Pager [phone]  If 7PM-7AM, please contact night-coverage [email or web address] Password [email or web address]  03/09/2017, 1:17 AM

## 2017-03-09 NOTE — ED Notes (Signed)
Assisted patient to get up to bedside commode.  Very difficult to get patient back in bed.  Became weak and unable to assist.  Other staff in to help.  Placed back in bed and adjusted for comfort.  Bed in lowest position with call bell within reach.  Encouraged to call for assistance as needed.

## 2017-03-09 NOTE — Progress Notes (Signed)
EEG Completed; Results Pending  

## 2017-03-09 NOTE — Progress Notes (Signed)
Pharmacy Antibiotic Note  ALEGRA ROST is a 83 y.o. female admitted on 03/08/2017 with weakness/fall.  Pharmacy has been consulted for Ceftriaxone dosing for UTI. WBC WNL.   Plan: -Ceftriaxone 1g IV q24h -F/U urine culture for directed therapy  Height: 5\' 4"  (162.6 cm) Weight: 228 lb (103.4 kg) IBW/kg (Calculated) : 54.7  Temp (24hrs), Avg:97.6 F (36.4 C), Min:97.6 F (36.4 C), Max:97.6 F (36.4 C)   Recent Labs Lab 03/08/17 2100  WBC 10.4  CREATININE 2.40*    Estimated Creatinine Clearance: 19 mL/min (A) (by C-G formula based on SCr of 2.4 mg/dL (H)).    Allergies  Allergen Reactions  . Peach [Prunus Persica] Rash    AGENT: peach fuzz    Narda Bonds 03/09/2017 2:40 AM

## 2017-03-09 NOTE — Evaluation (Signed)
Physical Therapy Evaluation Patient Details Name: [name] MRN: [id number] DOB: Jan 14, 1926 Today's Date: 03/09/2017   History of Present Illness  [name] is an 83 y.o. female who presented after having 3 falls on Sunday, in conjunction with generalized weakness. Family also reported absence-like spells. The patient has no prior history of seizures. PMH:  CHF, DM, HTN, neuropathy, syncope, UTI, cholecystitis, UTI, chronic afib.  Clinical Impression  Pt admitted with above diagnosis. Pt currently with functional limitations due to the deficits listed below (see PT Problem List).  Pt was able to stand to RW for a few seconds and took a few side steps to [facility].  Pt limited by poor endurance and decr steadiness on feet.  Will benefit from SNF at d/c for rehab.  Pt will benefit from skilled PT to increase their independence and safety with mobility to allow discharge to the venue listed below.      Follow Up Recommendations SNF;Supervision/Assistance - 24 hour    Equipment Recommendations  None recommended by PT    Recommendations for Other Services       Precautions / Restrictions Precautions Precautions: Fall Restrictions Weight Bearing Restrictions: No      Mobility  Bed Mobility Overal bed mobility: Needs Assistance Bed Mobility: Supine to Sit     Supine to sit: Mod assist;+2 for physical assistance     General bed mobility comments: Needed incr time, use of pad to assist with scooting hips forward and assist for LEs and elevation of trunk.   Transfers Overall transfer level: Needs assistance Equipment used: Rolling walker (2 wheeled) Transfers: Sit to/from Stand Sit to Stand: Mod assist;+2 physical assistance         General transfer comment: Pt stood to RW with cues for hand placement and assist to power up.  Pt took two side steps to [facility] and then had to sit down as she fatigues quickly.  "LEs tired" per pt.  Ambulation/Gait                 Stairs            Wheelchair Mobility    Modified Rankin (Stroke Patients Only)       Balance Overall balance assessment: Needs assistance;History of Falls Sitting-balance support: No upper extremity supported;Feet supported Sitting balance-Leahy Scale: Fair     Standing balance support: Bilateral upper extremity supported;During functional activity Standing balance-Leahy Scale: Poor Standing balance comment: relies on UE support.                              Pertinent Vitals/Pain Pain Assessment: 0-10 Pain Score: 4  Pain Location: LEs Pain Descriptors / Indicators: Sore Pain Intervention(s): Limited activity within patient's tolerance;Monitored during session;Repositioned  VSS on 2L  Home Living Family/patient expects to be discharged to:: Private residence Living Arrangements: Children Available Help at Discharge: Family;Available PRN/intermittently (daughter works during day) Type of Home: House Home Access: [place]: Two level;Bed/bath [place]: Environmental consultant - 2 wheels;Cane - single point;Wheelchair - [employer] - 4 wheels;Shower seat;Adaptive equipment Additional Comments: Pt was at [facility] care getting rehab prior to admit.      Prior Function Level of Independence: Needs assistance   Gait / Transfers Assistance Needed: used rollator for last year per pt and granddaughter  ADL's / Homemaking Assistance Needed: pt states she has assist with bathing and dressing  Hand Dominance   Dominant Hand: Right    Extremity/Trunk Assessment   Upper Extremity Assessment Upper Extremity Assessment: Defer to OT evaluation    Lower Extremity Assessment Lower Extremity Assessment: RLE deficits/detail;LLE deficits/detail RLE Deficits / Details: grossly 3-/5 LLE Deficits / Details: grossly 3-/5    Cervical / Trunk Assessment Cervical / Trunk Assessment: Kyphotic  Communication   Communication:  No difficulties  Cognition Arousal/Alertness: Awake/alert Behavior During Therapy: WFL for tasks assessed/performed Overall Cognitive Status: Within Functional Limits for tasks assessed                                        General Comments      Exercises General Exercises - Lower Extremity Ankle Circles/Pumps: AROM;Both;5 reps;Supine   Assessment/Plan    PT Assessment Patient needs continued PT services  PT Problem List Decreased strength;Decreased mobility;Decreased activity tolerance;Decreased balance;Decreased knowledge of use of DME;Decreased safety awareness;Decreased knowledge of precautions;Pain       PT Treatment Interventions DME instruction;Gait training;Functional mobility training;Therapeutic activities;Therapeutic exercise;Balance training;Patient/family education    PT Goals (Current goals can be found in the Care Plan section)  Acute Rehab PT Goals Patient Stated Goal: to go home after SNF PT Goal Formulation: With patient Time For Goal Achievement: Apr 18, 2017 Potential to Achieve Goals: Good    Frequency Min 2X/week   Barriers to discharge Decreased caregiver support      Co-evaluation               End of Session Equipment Utilized During Treatment: Gait belt;Oxygen Activity Tolerance: Patient limited by fatigue Patient left: in bed;with call bell/phone within reach;with bed alarm set (per nurse pt goijng for EEG and did not want her in chair) Nurse Communication: Mobility status PT Visit Diagnosis: Unsteadiness on feet (R26.81);Muscle weakness (generalized) (M62.81)    Time: 7096-4383 PT Time Calculation (min) (ACUTE ONLY): 14 min   Charges:   PT Evaluation $PT Eval Moderate Complexity: 1 Procedure     PT G Codes:   PT G-Codes **NOT FOR INPATIENT CLASS** Functional Assessment Tool Used: AM-PAC 6 Clicks Basic Mobility Functional Limitation: Mobility: Walking and moving around Mobility: Walking and Moving Around Current  Status (K1840): At least 60 percent but less than 80 percent impaired, limited or restricted Mobility: Walking and Moving Around Goal Status [phone]): At least 1 percent but less than 20 percent impaired, limited or restricted    [place] [phone] (pager)   Denice Paradise 03/09/2017, 9:50 AM

## 2017-03-09 NOTE — Progress Notes (Addendum)
Neurology Progress Note  The patient was seen after midnight by Dr. Kerney Elbe. I reviewed her chart at length.  In brief, this is an 83 year old woman with reported "absence-like spells." She also had 3 falls on the day of admission associated with generalized weakness. She was found to be dehydrated in the emergency department. She is been admitted for further evaluation to include EEG and MRI, though it was felt that her spells are more likely reflective of presyncope versus syncope rather than seizure.  EEG has been completed and shows mild to moderate diffuse generalized slowing, no epileptiform abnormalities and no seizures.  MRI scan of the brain showed no acute pathology with evidence of a remote right frontal lobe infarction and chronic small vessel ischemic changes in the bihemispheric white matter.  Continue to ensure adequate IV fluid resuscitation. Further syncope evaluation per primary. MRI and EEG do not show any propensity for seizure. Hold AED therapy at this time.

## 2017-03-09 NOTE — ED Notes (Addendum)
Pt. Aox4. Pt. High fall risk with fall risk bracelet and socks. Pt. Full code. Pt. Coming from [place] for repeated falls. Pt. On 3L oxygen via nasal cannula at all times for CHF. Bedpan for bathroom. Pt. MRI and EEG today. Orthostatic VS already done and charted.

## 2017-03-09 NOTE — Consult Note (Signed)
NEURO HOSPITALIST CONSULT NOTE   Requestig physician: Dr. Tomi Bamberger  Reason for Consult: Absence-like spells  History obtained from:  Patient and Chart    HPI:                                                                                                                                          [name] is an 83 y.o. female who presented after having 3 falls on Sunday, in conjunction with generalized weakness. Family also reported absence-like spells. The patient has no prior history of seizures. She states that she recalls shaking but is a poor historian and is unable to further specify her symptoms.   Past Medical History:  Diagnosis Date  . Anemia   . Arthritis   . Breast cancer ([place]) 2012   right  . CHF (congestive heart failure) ([place])   . Cholelithiasis   . DCIS (ductal carcinoma in situ) of breast 07/15/2010   DCIS s/p lumpectomy and radiation 2011  . Diabetes mellitus   . DVT (deep venous thrombosis) ([place])   . GERD (gastroesophageal reflux disease)   . GI bleed   . Heart murmur   . Hyperlipidemia   . Hypertension   . Peripheral neuropathy ([place])   . Shortness of breath dyspnea   . Syncope 05/09/2015    Past Surgical History:  Procedure Laterality Date  . BREAST SURGERY    . CARDIAC CATHETERIZATION N/A 05/11/2015   Procedure: Left Heart Cath and Coronary Angiography;  Surgeon: [name], MD;  Location: [facility];  Service: Cardiovascular;  Laterality: N/A;  . CARDIAC CATHETERIZATION N/A 08/09/2015   Procedure: Right Heart Cath;  Surgeon: [name], MD;  Location: [facility];  Service: Cardiovascular;  Laterality: N/A;  . CATARACT EXTRACTION Left 05/08/2015  . ENDOSCOPIC RETROGRADE CHOLANGIOPANCREATOGRAPHY (ERCP) WITH PROPOFOL  10/14/2012   Procedure: ENDOSCOPIC RETROGRADE CHOLANGIOPANCREATOGRAPHY (ERCP) WITH PROPOFOL;  Surgeon: Milus Banister, MD;  Location: [facility];  Service: Endoscopy;  Laterality: N/A;  .  ESOPHAGOGASTRODUODENOSCOPY  09/01/2012   Procedure: ESOPHAGOGASTRODUODENOSCOPY (EGD);  Surgeon: Ladene Artist, MD,FACG;  Location: [facility];  Service: Endoscopy;  Laterality: N/A;  . ESOPHAGOGASTRODUODENOSCOPY  09/03/2012   Procedure: ESOPHAGOGASTRODUODENOSCOPY (EGD);  Surgeon: Inda Castle, MD;  Location: [facility];  Service: Endoscopy;  Laterality: N/A;  . EUS  10/14/2012   Procedure: UPPER ENDOSCOPIC ULTRASOUND (EUS) LINEAR;  Surgeon: Milus Banister, MD;  Location: [facility];  Service: Endoscopy;  Laterality: N/A;  . EUS  11/11/2012   Procedure: UPPER ENDOSCOPIC ULTRASOUND (EUS) LINEAR;  Surgeon: Milus Banister, MD;  Location: [facility];  Service: Endoscopy;  Laterality: N/A;    History reviewed. No pertinent family history.  Social History:  reports that she has never smoked. She has never  used smokeless tobacco. She reports that she does not drink alcohol or use drugs.  Allergies  Allergen Reactions  . Peach [Prunus Persica] Rash    AGENT: peach fuzz    MEDICATIONS:                                                                                                                      Current Facility-Administered Medications:  .  acetaminophen (TYLENOL) tablet 650 mg, 650 mg, Oral, Q6H PRN **OR** acetaminophen (TYLENOL) suppository 650 mg, 650 mg, Rectal, Q6H PRN, Norval Morton, MD .  albuterol (PROVENTIL) (2.5 MG/3ML) 0.083% nebulizer solution 2.5 mg, 2.5 mg, Nebulization, Q2H PRN, Norval Morton, MD .  atorvastatin (LIPITOR) tablet 20 mg, 20 mg, Oral, Daily, Rondell A Smith, MD .  cefTRIAXone (ROCEPHIN) 1 g in dextrose 5 % 50 mL IVPB, 1 g, Intravenous, Q24H, Norval Morton, MD, Stopped at 03/09/17 0329 .  gabapentin (NEURONTIN) capsule 300 mg, 300 mg, Oral, Daily, Norval Morton, MD .  Derrill Memo ON 03/11/2017] metolazone (ZAROXOLYN) tablet 2.5 mg, 2.5 mg, Oral, Once per day on Wed Sat, Norval Morton, MD .  metoprolol succinate (TOPROL-XL) 24 hr tablet 25 mg, 25 mg,  Oral, Daily, Rondell A Tamala Julian, MD .  pantoprazole (PROTONIX) EC tablet 40 mg, 40 mg, Oral, BID, Rondell A Smith, MD .  potassium chloride SA (K-DUR,KLOR-CON) CR tablet 40 mEq, 40 mEq, Oral, QPM, Rondell A Smith, MD .  potassium chloride SA (K-DUR,KLOR-CON) CR tablet 60 mEq, 60 mEq, Oral, Daily, Rondell A Smith, MD .  sodium chloride flush (NS) 0.9 % injection 3 mL, 3 mL, Intravenous, Q12H, Rondell A Smith, MD .  torsemide (DEMADEX) tablet 80 mg, 80 mg, Oral, Daily **AND** torsemide (DEMADEX) tablet 40 mg, 40 mg, Oral, q1800, Geradine Girt, DO .  warfarin (COUMADIN) tablet 3 mg, 3 mg, Oral, q1800, Norval Morton, MD .  Warfarin - Pharmacist Dosing Inpatient, , Does not apply, q1800, Norval Morton, MD  Current Outpatient Prescriptions:  .  acetaminophen (TYLENOL) 500 MG tablet, Take 1,000 mg by mouth 2 (two) times daily as needed for mild pain. , Disp: , Rfl:  .  atorvastatin (LIPITOR) 20 MG tablet, TAKE 1 TABLET BY MOUTH EVERY DAY, Disp: 90 tablet, Rfl: 3 .  gabapentin (NEURONTIN) 300 MG capsule, Take 300 mg by mouth every morning. , Disp: , Rfl: 3 .  KLOR-CON M20 20 MEQ tablet, TAKE 3 TABLETS IN THE AM AND 2 TABLETS IN THE PM, Disp: 150 tablet, Rfl: 3 .  metolazone (ZAROXOLYN) 2.5 MG tablet, Take 2.5 mg by mouth See admin instructions. Takes 1 tab on wed and sat, Disp: , Rfl:  .  metoprolol succinate (TOPROL-XL) 25 MG 24 hr tablet, Take 1 tablet (25 mg total) by mouth at bedtime. (Patient taking differently: Take 25 mg by mouth every morning. ), Disp: 30 tablet, Rfl: 3 .  OXYGEN, Inhale 3 L into the lungs continuous., Disp: , Rfl:  .  pantoprazole (PROTONIX)  40 MG tablet, Take 1 tablet (40 mg total) by mouth 2 (two) times daily before a meal. (Patient taking differently: Take 40 mg by mouth daily. ), Disp: 60 tablet, Rfl: 3 .  potassium chloride SA (K-DUR,KLOR-CON) 20 MEQ tablet, Take 2 tablets (40 mEq total) by mouth daily. (Patient taking differently: Take 40 mEq by mouth every evening. ),  Disp: 60 tablet, Rfl: 3 .  potassium chloride SA (K-DUR,KLOR-CON) 20 MEQ tablet, Take 60 mEq by mouth every morning., Disp: , Rfl:  .  torsemide (DEMADEX) 20 MG tablet, Take 80 mg (4 tabs) in AM and 40 mg (2 tabs) in PM. (Patient taking differently: Take 40-80 mg by mouth See admin instructions. Take 80 mg (4 tabs) in AM and 40 mg (2 tabs) in PM.), Disp: 180 tablet, Rfl: 3 .  warfarin (COUMADIN) 2 MG tablet, Take 3 mg by mouth daily. , Disp: , Rfl:    ROS:                                                                                                                                       History obtained from patient. Denies headache, chest pain, abdominal pain or limb pain. Other ROS as per HPI.   Blood pressure 120/72, pulse (!) 108, resp. rate 20, height 5\' 4"  (1.626 m), weight 103.4 kg (228 lb), SpO2 100 %.   General Examination:                                                                                                      General: Generally frail appearing taking into account her morbid obesity HEENT-  [place]/AT.  Lungs- Respirations unlabored. Extremities- Warm and well perfused  Neurological Examination Mental Status: Alert. Pleasant and cooperative. Oriented to city and state. Gives month after delayed recall, states year is "20", not oriented to day of week. Speech fluent; has mild difficulty with naming and repetition. Able to follow all motor commands.  Cranial Nerves: II: Visual fields intact. PERRL.  III,IV, VI: ptosis not present, EOMI without nystagmus V,VII: smile symmetric, facial temperature sensation normal bilaterally VIII: hearing intact to conversation IX,X: no hoarseness or hypophonia XI: no asymmetry noted XII: midline tongue extension Motor: RUE: 4+/5 LUE: 4+/5 RLE: In the context of decreased effort: 3/5 hip flexion, 4+/5 knee extension, 5/5 ankle dorsiflexion LLE: In the context of decreased effort: 3/5 hip flexion, 4+/5 knee extension, 5/5 ankle  dorsiflexion Sensory: Temperature and light touch intact x 4. No extinction. Deep Tendon  Reflexes: 2+ bilateral brachioradialis and biceps. 0 bilateral patellae in the context of prominent adiposity. Right achilles 1+, left achilles 0. Right toe upgoing, left toe equivocal.   Cerebellar: No ataxia with FNF bilaterally.  Gait: Deferred Other: No jerking, tremor or other adventitious movements noted  Lab Results: Basic Metabolic Panel:  Recent Labs Lab 03/08/17 2100  NA 136  K 4.3  CL 85*  CO2 35*  GLUCOSE 187*  BUN 34*  CREATININE 2.40*  CALCIUM 9.2    Liver Function Tests:  Recent Labs Lab 03/08/17 2100  AST 26  ALT 10*  ALKPHOS 78  BILITOT 0.8  PROT 7.9  ALBUMIN 3.8   No results for input(s): LIPASE, AMYLASE in the last 168 hours. No results for input(s): AMMONIA in the last 168 hours.  CBC:  Recent Labs Lab 03/08/17 2100  WBC 10.4  NEUTROABS 7.5  HGB 9.8*  HCT 31.8*  MCV 89.3  PLT 190    Cardiac Enzymes:  Recent Labs Lab 03/08/17 2100  TROPONINI 0.04*    Lipid Panel: No results for input(s): CHOL, TRIG, HDL, CHOLHDL, VLDL, LDLCALC in the last 168 hours.  CBG:  Recent Labs Lab 03/08/17 2156  GLUCAP 181*    Microbiology: Results for orders placed or performed during the hospital encounter of 08/31/15  Culture, blood (routine x 2)     Status: None   Collection Time: 08/31/15  9:40 AM  Result Value Ref Range Status   Specimen Description BLOOD RIGHT HAND  Final   Special Requests BOTTLES DRAWN AEROBIC AND ANAEROBIC 5CC  Final   Culture NO GROWTH 5 DAYS  Final   Report Status 09/05/2015 FINAL  Final  Culture, blood (routine x 2)     Status: None   Collection Time: 08/31/15  9:46 AM  Result Value Ref Range Status   Specimen Description BLOOD LEFT HAND  Final   Special Requests BOTTLES DRAWN AEROBIC AND ANAEROBIC 5CC  Final   Culture NO GROWTH 5 DAYS  Final   Report Status 09/05/2015 FINAL  Final  Urine culture     Status: None    Collection Time: 08/31/15 10:35 AM  Result Value Ref Range Status   Specimen Description URINE, CATHETERIZED  Final   Special Requests NONE  Final   Culture >=100,000 COLONIES/mL ESCHERICHIA COLI  Final   Report Status 09/02/2015 FINAL  Final   Organism ID, Bacteria ESCHERICHIA COLI  Final      Susceptibility   Escherichia coli - MIC*    AMPICILLIN >=32 RESISTANT Resistant     CEFAZOLIN <=4 SENSITIVE Sensitive     CEFTRIAXONE <=1 SENSITIVE Sensitive     CIPROFLOXACIN <=0.25 SENSITIVE Sensitive     GENTAMICIN <=1 SENSITIVE Sensitive     IMIPENEM <=0.25 SENSITIVE Sensitive     NITROFURANTOIN <=16 SENSITIVE Sensitive     TRIMETH/SULFA >=320 RESISTANT Resistant     AMPICILLIN/SULBACTAM 16 INTERMEDIATE Intermediate     PIP/TAZO <=4 SENSITIVE Sensitive     * >=100,000 COLONIES/mL ESCHERICHIA COLI  MRSA PCR Screening     Status: None   Collection Time: 08/31/15  4:49 PM  Result Value Ref Range Status   MRSA by PCR NEGATIVE NEGATIVE Final    Comment:        The GeneXpert MRSA Assay (FDA approved for NASAL specimens only), is one component of a comprehensive MRSA colonization surveillance program. It is not intended to diagnose MRSA infection nor to guide or monitor treatment for MRSA infections.     Coagulation  Studies:  Recent Labs  03/08/17 2100  LABPROT 26.5*  INR 2.39    Imaging: Dg Chest 2 View  Result Date: 03/08/2017 CLINICAL DATA:  Syncope.  Head trauma EXAM: CHEST  2 VIEW COMPARISON:  08/24/16 FINDINGS: There is moderate cardiac enlargement. Aortic atherosclerosis. Small left pleural effusion identified. Pulmonary vascular congestion is identified. No airspace opacities. IMPRESSION: 1. Cardiac enlargement aortic atherosclerosis. 2. Small left pleural effusion pulmonary vascular congestion Electronically Signed   By: Kerby Moors M.D.   On: 03/08/2017 21:52   Ct Head Wo Contrast  Result Date: 03/08/2017 CLINICAL DATA:  Generalized weakness.  Fall. EXAM: CT HEAD  WITHOUT CONTRAST TECHNIQUE: Contiguous axial images were obtained from the base of the skull through the vertex without intravenous contrast. COMPARISON:  05/19/2015 FINDINGS: Brain: No evidence of acute infarction, hemorrhage, hydrocephalus, extra-axial collection or mass lesion/mass effect. There is patchy low attenuation throughout the subcortical and periventricular white matter compatible with chronic small vessel ischemic disease. Prominence of the sulci and ventricles noted compatible with brain atrophy. Vascular: No hyperdense vessel or unexpected calcification. Skull: Normal. Negative for fracture or focal lesion. Sinuses/Orbits: The paranasal sinuses are clear. There is partial opacification of the right mastoid air cells. Other: None. IMPRESSION: 1. Small vessel ischemic disease brain atrophy. 2. No acute intracranial abnormalities. Electronically Signed   By: Kerby Moors M.D.   On: 03/08/2017 22:00    Assessment: 1. New onset of absence-like spells per family. No prior history of seizures. No clinical seizure activity during bedside assessment. Exam is nonlateralizing, but reveals diffuse weakness most consistent with deconditioning.  2. CT head reveals no acute abnormality. Chronic small vessel ischemic changes and cerebral atrophy are noted.  3. Elevated BUN/Cr. Compatible with but not diagnostic of volume depletion. Oral mucosa somewhat poorly hydrated and BP was low during bedside exam. This raises question of presyncope with weakness or overt syncope as a possible etiology for her falls. Volume depletion could also result in convulsive syncope (a type of seizure that is non-epileptic), which may explain her new onset "absence-like spells".   Recommendations: 1. EEG. 2. MRI brain without contrast.  3. IV hydration.  4. PT 5. Seizure precautions. 6. Monitor BP as she is hydrated and also track with orthostatics to determine if there is improvement.   Electronically signed: Dr. Kerney Elbe 03/09/2017, 12:14 AM

## 2017-03-09 NOTE — Progress Notes (Signed)
ANTICOAGULATION CONSULT NOTE - Initial Consult  Pharmacy Consult for Coumadin Indication: atrial fibrillation  Allergies  Allergen Reactions  . Peach [Prunus Persica] Rash    AGENT: peach fuzz    Patient Measurements: Height: 5\' 4"  (162.6 cm) Weight: 228 lb (103.4 kg) IBW/kg (Calculated) : 54.7  Vital Signs: Temp: 97.6 F (36.4 C) (04/02 0040) Temp Source: Oral (04/02 0040) BP: 133/43 (04/02 0145) Pulse Rate: 87 (04/02 0130)  Labs:  Recent Labs  03/08/17 2100 03/09/17 0055  HGB 9.8*  --   HCT 31.8*  --   PLT 190  --   LABPROT 26.5*  --   INR 2.39  --   CREATININE 2.40*  --   TROPONINI 0.04* 0.03*    Estimated Creatinine Clearance: 19 mL/min (A) (by C-G formula based on SCr of 2.4 mg/dL (H)).   Medical History: Past Medical History:  Diagnosis Date  . Anemia   . Arthritis   . Breast cancer ([place]) 2012   right  . CHF (congestive heart failure) ([place])   . Cholelithiasis   . DCIS (ductal carcinoma in situ) of breast 07/15/2010   DCIS s/p lumpectomy and radiation 2011  . Diabetes mellitus   . DVT (deep venous thrombosis) ([place])   . GERD (gastroesophageal reflux disease)   . GI bleed   . Heart murmur   . Hyperlipidemia   . Hypertension   . Peripheral neuropathy ([place])   . Shortness of breath dyspnea   . Syncope 05/09/2015    Medications:  No current facility-administered medications on file prior to encounter.    Current Outpatient Prescriptions on File Prior to Encounter  Medication Sig Dispense Refill  . acetaminophen (TYLENOL) 500 MG tablet Take 1,000 mg by mouth 2 (two) times daily as needed for mild pain.     Marland Kitchen atorvastatin (LIPITOR) 20 MG tablet TAKE 1 TABLET BY MOUTH EVERY DAY 90 tablet 3  . gabapentin (NEURONTIN) 300 MG capsule Take 300 mg by mouth every morning.   3  . KLOR-CON M20 20 MEQ tablet TAKE 3 TABLETS IN THE AM AND 2 TABLETS IN THE PM 150 tablet 3  . metoprolol succinate (TOPROL-XL) 25 MG 24 hr tablet Take 1 tablet (25 mg total) by mouth  at bedtime. (Patient taking differently: Take 25 mg by mouth every morning. ) 30 tablet 3  . pantoprazole (PROTONIX) 40 MG tablet Take 1 tablet (40 mg total) by mouth 2 (two) times daily before a meal. (Patient taking differently: Take 40 mg by mouth daily. ) 60 tablet 3  . potassium chloride SA (K-DUR,KLOR-CON) 20 MEQ tablet Take 2 tablets (40 mEq total) by mouth daily. (Patient taking differently: Take 40 mEq by mouth every evening. ) 60 tablet 3  . torsemide (DEMADEX) 20 MG tablet Take 80 mg (4 tabs) in AM and 40 mg (2 tabs) in PM. (Patient taking differently: Take 40-80 mg by mouth See admin instructions. Take 80 mg (4 tabs) in AM and 40 mg (2 tabs) in PM.) 180 tablet 3  . warfarin (COUMADIN) 2 MG tablet Take 3 mg by mouth daily.        Assessment: 83 y.o. female admitted with falls/syncope, h/o Afib, to continue Coumadin  Goal of Therapy:  INR 2-3 Monitor platelets by anticoagulation protocol: Yes   Plan:  Coumadin 3 mg daily  Makayia Duplessis, Bronson Curb 03/09/2017,2:56 AM

## 2017-03-09 NOTE — Progress Notes (Signed)
Patient admitted after midnight. Please see H&P by Dr. Tamala Julian.  Suspect patient having orthostatic hypotension.  Patient falls after getting up from seated position.  Recheck orthos in AM.  Hold PM dose of diuretic.  Cr worse than pervious.  Patient is also being worked up for seizures- MRI and EEG pending.  Eulogio Bear DO

## 2017-03-09 NOTE — ED Notes (Signed)
Breakfast tray ordered 

## 2017-03-10 ENCOUNTER — Encounter (HOSPITAL_COMMUNITY): Payer: Self-pay | Admitting: Physician Assistant

## 2017-03-10 DIAGNOSIS — E1165 Type 2 diabetes mellitus with hyperglycemia: Secondary | ICD-10-CM | POA: Diagnosis not present

## 2017-03-10 DIAGNOSIS — Z923 Personal history of irradiation: Secondary | ICD-10-CM | POA: Diagnosis not present

## 2017-03-10 DIAGNOSIS — E785 Hyperlipidemia, unspecified: Secondary | ICD-10-CM | POA: Diagnosis present

## 2017-03-10 DIAGNOSIS — R9401 Abnormal electroencephalogram [EEG]: Secondary | ICD-10-CM | POA: Diagnosis present

## 2017-03-10 DIAGNOSIS — R748 Abnormal levels of other serum enzymes: Secondary | ICD-10-CM

## 2017-03-10 DIAGNOSIS — G629 Polyneuropathy, unspecified: Secondary | ICD-10-CM | POA: Diagnosis present

## 2017-03-10 DIAGNOSIS — B9620 Unspecified Escherichia coli [E. coli] as the cause of diseases classified elsewhere: Secondary | ICD-10-CM | POA: Diagnosis present

## 2017-03-10 DIAGNOSIS — K219 Gastro-esophageal reflux disease without esophagitis: Secondary | ICD-10-CM | POA: Diagnosis present

## 2017-03-10 DIAGNOSIS — R296 Repeated falls: Secondary | ICD-10-CM | POA: Diagnosis present

## 2017-03-10 DIAGNOSIS — W19XXXA Unspecified fall, initial encounter: Secondary | ICD-10-CM | POA: Diagnosis not present

## 2017-03-10 DIAGNOSIS — R778 Other specified abnormalities of plasma proteins: Secondary | ICD-10-CM | POA: Diagnosis present

## 2017-03-10 DIAGNOSIS — D638 Anemia in other chronic diseases classified elsewhere: Secondary | ICD-10-CM | POA: Diagnosis present

## 2017-03-10 DIAGNOSIS — R55 Syncope and collapse: Secondary | ICD-10-CM

## 2017-03-10 DIAGNOSIS — Z853 Personal history of malignant neoplasm of breast: Secondary | ICD-10-CM | POA: Diagnosis not present

## 2017-03-10 DIAGNOSIS — I130 Hypertensive heart and chronic kidney disease with heart failure and stage 1 through stage 4 chronic kidney disease, or unspecified chronic kidney disease: Secondary | ICD-10-CM | POA: Diagnosis present

## 2017-03-10 DIAGNOSIS — N390 Urinary tract infection, site not specified: Secondary | ICD-10-CM

## 2017-03-10 DIAGNOSIS — R569 Unspecified convulsions: Secondary | ICD-10-CM

## 2017-03-10 DIAGNOSIS — I482 Chronic atrial fibrillation: Secondary | ICD-10-CM

## 2017-03-10 DIAGNOSIS — Z6839 Body mass index (BMI) 39.0-39.9, adult: Secondary | ICD-10-CM | POA: Diagnosis not present

## 2017-03-10 DIAGNOSIS — I5032 Chronic diastolic (congestive) heart failure: Secondary | ICD-10-CM | POA: Diagnosis present

## 2017-03-10 DIAGNOSIS — J9610 Chronic respiratory failure, unspecified whether with hypoxia or hypercapnia: Secondary | ICD-10-CM | POA: Diagnosis present

## 2017-03-10 DIAGNOSIS — I951 Orthostatic hypotension: Secondary | ICD-10-CM | POA: Diagnosis present

## 2017-03-10 DIAGNOSIS — Z9981 Dependence on supplemental oxygen: Secondary | ICD-10-CM | POA: Diagnosis not present

## 2017-03-10 DIAGNOSIS — E11649 Type 2 diabetes mellitus with hypoglycemia without coma: Secondary | ICD-10-CM | POA: Diagnosis present

## 2017-03-10 DIAGNOSIS — E118 Type 2 diabetes mellitus with unspecified complications: Secondary | ICD-10-CM | POA: Diagnosis not present

## 2017-03-10 DIAGNOSIS — N184 Chronic kidney disease, stage 4 (severe): Secondary | ICD-10-CM | POA: Diagnosis present

## 2017-03-10 DIAGNOSIS — N179 Acute kidney failure, unspecified: Secondary | ICD-10-CM | POA: Diagnosis present

## 2017-03-10 DIAGNOSIS — E1122 Type 2 diabetes mellitus with diabetic chronic kidney disease: Secondary | ICD-10-CM | POA: Diagnosis present

## 2017-03-10 DIAGNOSIS — E1140 Type 2 diabetes mellitus with diabetic neuropathy, unspecified: Secondary | ICD-10-CM | POA: Diagnosis present

## 2017-03-10 LAB — CBC
HEMATOCRIT: 29.5 % — AB (ref 36.0–46.0)
Hemoglobin: 9.2 g/dL — ABNORMAL LOW (ref 12.0–15.0)
MCH: 27.9 pg (ref 26.0–34.0)
MCHC: 31.2 g/dL (ref 30.0–36.0)
MCV: 89.4 fL (ref 78.0–100.0)
Platelets: 174 10*3/uL (ref 150–400)
RBC: 3.3 MIL/uL — ABNORMAL LOW (ref 3.87–5.11)
RDW: 16.7 % — AB (ref 11.5–15.5)
WBC: 7.1 10*3/uL (ref 4.0–10.5)

## 2017-03-10 LAB — BASIC METABOLIC PANEL WITH GFR
Anion gap: 12 (ref 5–15)
BUN: 29 mg/dL — ABNORMAL HIGH (ref 6–20)
CO2: 40 mmol/L — ABNORMAL HIGH (ref 22–32)
Calcium: 8.7 mg/dL — ABNORMAL LOW (ref 8.9–10.3)
Chloride: 85 mmol/L — ABNORMAL LOW (ref 101–111)
Creatinine, Ser: 1.93 mg/dL — ABNORMAL HIGH (ref 0.44–1.00)
GFR calc Af Amer: 26 mL/min — ABNORMAL LOW
GFR calc non Af Amer: 22 mL/min — ABNORMAL LOW
Glucose, Bld: 166 mg/dL — ABNORMAL HIGH (ref 65–99)
Potassium: 3.6 mmol/L (ref 3.5–5.1)
Sodium: 137 mmol/L (ref 135–145)

## 2017-03-10 LAB — PROTIME-INR
INR: 2.25
Prothrombin Time: 25.3 s — ABNORMAL HIGH (ref 11.4–15.2)

## 2017-03-10 MED ORDER — METOPROLOL SUCCINATE ER 25 MG PO TB24
25.0000 mg | ORAL_TABLET | Freq: Once | ORAL | Status: AC
Start: 2017-03-10 — End: 2017-03-10
  Administered 2017-03-10: 25 mg via ORAL
  Filled 2017-03-10: qty 1

## 2017-03-10 MED ORDER — SODIUM CHLORIDE 0.9 % IV SOLN
INTRAVENOUS | Status: DC
  Administered 2017-03-10: 11:00:00 via INTRAVENOUS

## 2017-03-10 MED ORDER — TORSEMIDE 20 MG PO TABS
40.0000 mg | ORAL_TABLET | Freq: Every day | ORAL | Status: DC
  Administered 2017-03-11 – 2017-03-12 (×2): 40 mg via ORAL
  Filled 2017-03-10 (×2): qty 2

## 2017-03-10 MED ORDER — TORSEMIDE 20 MG PO TABS
60.0000 mg | ORAL_TABLET | Freq: Every morning | ORAL | Status: DC
  Administered 2017-03-11 – 2017-03-12 (×2): 60 mg via ORAL
  Filled 2017-03-10 (×2): qty 3

## 2017-03-10 MED ORDER — POTASSIUM CHLORIDE CRYS ER 20 MEQ PO TBCR: 40.0000 meq | EXTENDED_RELEASE_TABLET | Freq: Every day | ORAL | Status: DC

## 2017-03-10 MED ORDER — POTASSIUM CHLORIDE CRYS ER 20 MEQ PO TBCR
40.0000 meq | EXTENDED_RELEASE_TABLET | Freq: Two times a day (BID) | ORAL | Status: DC
  Administered 2017-03-10 – 2017-03-12 (×4): 40 meq via ORAL
  Filled 2017-03-10 (×5): qty 2

## 2017-03-10 MED ORDER — METOPROLOL SUCCINATE ER 50 MG PO TB24
50.0000 mg | ORAL_TABLET | Freq: Every day | ORAL | Status: DC
  Administered 2017-03-11 – 2017-03-12 (×2): 50 mg via ORAL
  Filled 2017-03-10 (×2): qty 1

## 2017-03-10 NOTE — Progress Notes (Addendum)
PT and nurse tried to get pt up for orthostatic blood pressure reading, and to sit on bed side commode. Pt could not tolerate standing position and was sat on bed side commode with the help of PT and nurse. She started to have tremors after sitting on the bed side commode and passed out for 15 second, pulse rate was elevated in the 130s- 180s. Unit was alerted to the situation, everyone rushed in to help, MD notified. Pt is stable at this time. Will continue to treat per MD orders.

## 2017-03-10 NOTE — Progress Notes (Signed)
ANTICOAGULATION CONSULT NOTE - Follow-up Consult  Pharmacy Consult for warfarin Indication: atrial fibrillation  Allergies  Allergen Reactions  . Peach [Prunus Persica] Rash    AGENT: peach fuzz    Patient Measurements: Height: 5\' 4"  (162.6 cm) Weight: 233 lb 14.5 oz (106.1 kg) IBW/kg (Calculated) : 54.7  Vital Signs: Temp: 97.8 F (36.6 C) (04/03 0538) Temp Source: Oral (04/03 0538) BP: 120/64 (04/03 0538) Pulse Rate: 100 (04/03 0538)  Labs:  Recent Labs  03/08/17 2100 03/09/17 0055 03/09/17 0537 03/10/17 0537 03/10/17 0800  HGB 9.8*  --  9.2*  --  9.2*  HCT 31.8*  --  30.4*  --  29.5*  PLT 190  --  177  --  174  LABPROT 26.5*  --   --  25.3*  --   INR 2.39  --   --  2.25  --   CREATININE 2.40*  --  2.17*  --  1.93*  TROPONINI 0.04* 0.03* 0.03*  --   --     Estimated Creatinine Clearance: 24 mL/min (A) (by C-G formula based on SCr of 1.93 mg/dL (H)).   Medical History: Past Medical History:  Diagnosis Date  . Anemia   . Arthritis   . Atrial fibrillation ([place])   . Breast cancer ([place]) 2012   right  . CHF (congestive heart failure) ([place])   . Cholelithiasis   . DCIS (ductal carcinoma in situ) of breast 07/15/2010   DCIS s/p lumpectomy and radiation 2011  . Diabetes mellitus   . DVT (deep venous thrombosis) ([place])   . GERD (gastroesophageal reflux disease)   . GI bleed   . Heart murmur   . Hyperlipidemia   . Hypertension   . Peripheral neuropathy ([place])   . Shortness of breath dyspnea   . Syncope 05/09/2015    Assessment: 83 y.o. female admitted with falls/syncope, h/o Afib, to continue warfarin from PTA. INR remains therapeutic at 2.25, cbc stable, no bleeding noted.  Home dose warfarin 3 mg daily  Goal of Therapy:  INR 2-3 Monitor platelets by anticoagulation protocol: Yes   Plan:  Continue warfarin 3 mg daily Monitor daily INR, CBC, clinical course, s/sx of bleed, PO intake, DDI   Thank you for allowing [place] to participate in this patients  care.  Jens Som, PharmD Clinical phone for 03/10/2017 from 7a-3:30p: [phone] If after 3:30p, please call main pharmacy at: [phone] 03/10/2017 9:22 AM

## 2017-03-10 NOTE — Progress Notes (Signed)
PROGRESS NOTE    [name]  ZOX:[id number] DOB: 1926-04-05 DOA: 03/08/2017 PCP: Maximino Greenland, MD   Outpatient Specialists:     Brief Narrative:  [name] is a 83 y.o. female with medical history significant of diastolic CHF, MVR, PAH, HLD, oxygen dependent on 3 L, syncope, CKD stage IV; who presents after having 3 falls yesterday at [place]. Much of history is obtained from report.  Patient notes that she fell and hit her head on the left side while trying to get on the commode. She is not able to recall all 3 falls. She states that she gets very weak when ever she is standing for too long. Associated symptoms include shortness of breath with exertion and left leg pain following the fall. Family gives report of patient having staring spells where she will have a blank stare and be unresponsive and have right hand shaking. Thereafter patient has been known to have vomiting and be drowsy for a period of time prior to returning back to her normal self. Per the family these episodes have been increasing in frequency. Patient denies having any headache, nausea, chest pain, focal weakness, or change in vision. Patient is scheduled to see a neurologist on April 11 due to the question of these seizure like episodes.   Assessment & Plan:   Principal Problem:   Falls Active Problems:   Chronic atrial fibrillation- CHADs VASc =6   Lower urinary tract infectious disease   Syncope   CKD (chronic kidney disease) stage 4, GFR 15-29 ml/min (HCC)   Diabetes mellitus type 2, uncontrolled (HCC)   Elevated troponin   Seizure (HCC)   Syncopal spells -HR increases in atrial fib -seizures have essentially been ruled out with EEG/MRI-- no medication prescribed for seizures -episodes happen on bedside commode per patient- patient had episode in hospital today when working with PT -TED hose -it appears this happened last year as well-- patient was thought to be dehydrated,  given IVF and diuretics decreased.  Followed with CHF team and they increased medications due to dyspnea -will be difficult to find the correct regimen for patient -CHF team consult -gentle IVF until seen by CHF team  AKI on CKD -responding to IVF  DM -SSI  UTI -ecoli -rocephin  Chronic respiratory failure -wean O2 as CO2 on BMP increasing -patinet was 100% on 3L  Morbid obesity -encourage weight loss        DVT prophylaxis:  Fully anticoagulated   Code Status: Full Code   Family Communication: Called daughter  Disposition Plan:  SNF when work up complete   Consultants:   CHF team     Subjective: Patient had no complaints this AM except her breakfast was cold This PM while working with PT, had syncopal spell with arm shaking-- HR went up to 150s  Objective: Vitals:   03/10/17 1047 03/10/17 1109 03/10/17 1120 03/10/17 1124  BP: 122/76 116/70    Pulse: 100 (!) 116    Resp:      Temp:      TempSrc:      SpO2: 100%  100% 97%  Weight:      Height:        Intake/Output Summary (Last 24 hours) at 03/10/17 1356 Last data filed at 03/10/17 0500  Gross per 24 hour  Intake              170 ml  Output  0 ml  Net              170 ml   Filed Weights   03/08/17 1914 03/10/17 0438  Weight: 103.4 kg (228 lb) 106.1 kg (233 lb 14.5 oz)    Examination:  General exam: appears stated age Respiratory system: diminished Cardiovascular system:  Irregular, + edema Gastrointestinal system: Abdomen is nondistended, soft and nontender. No organomegaly or masses felt. Normal bowel sounds heard. Central nervous system: Alert and oriented. No focal neurological deficits.      Data Reviewed: I have personally reviewed following labs and imaging studies  CBC:  Recent Labs Lab 03/08/17 2100 03/09/17 0537 03/10/17 0800  WBC 10.4 8.2 7.1  NEUTROABS 7.5  --   --   HGB 9.8* 9.2* 9.2*  HCT 31.8* 30.4* 29.5*  MCV 89.3 89.4 89.4  PLT 190  177 144   Basic Metabolic Panel:  Recent Labs Lab 03/08/17 2100 03/09/17 0537 03/10/17 0800  NA 136 138 137  K 4.3 3.6 3.6  CL 85* 86* 85*  CO2 35* 37* 40*  GLUCOSE 187* 193* 166*  BUN 34* 33* 29*  CREATININE 2.40* 2.17* 1.93*  CALCIUM 9.2 9.0 8.7*   GFR: Estimated Creatinine Clearance: 24 mL/min (A) (by C-G formula based on SCr of 1.93 mg/dL (H)). Liver Function Tests:  Recent Labs Lab 03/08/17 2100  AST 26  ALT 10*  ALKPHOS 78  BILITOT 0.8  PROT 7.9  ALBUMIN 3.8   No results for input(s): LIPASE, AMYLASE in the last 168 hours. No results for input(s): AMMONIA in the last 168 hours. Coagulation Profile:  Recent Labs Lab 03/08/17 2100 03/10/17 0537  INR 2.39 2.25   Cardiac Enzymes:  Recent Labs Lab 03/08/17 2100 03/09/17 0055 03/09/17 0537  TROPONINI 0.04* 0.03* 0.03*   BNP (last 3 results) No results for input(s): PROBNP in the last 8760 hours. HbA1C: No results for input(s): HGBA1C in the last 72 hours. CBG:  Recent Labs Lab 03/08/17 2156  GLUCAP 181*   Lipid Profile: No results for input(s): CHOL, HDL, LDLCALC, TRIG, CHOLHDL, LDLDIRECT in the last 72 hours. Thyroid Function Tests:  Recent Labs  03/09/17 0537  TSH 2.132   Anemia Panel: No results for input(s): VITAMINB12, FOLATE, FERRITIN, TIBC, IRON, RETICCTPCT in the last 72 hours. Urine analysis:    Component Value Date/Time   COLORURINE STRAW (A) 03/08/2017 2159   APPEARANCEUR CLEAR 03/08/2017 2159   LABSPEC 1.005 03/08/2017 2159   PHURINE 5.0 03/08/2017 2159   GLUCOSEU 50 (A) 03/08/2017 2159   HGBUR SMALL (A) 03/08/2017 2159   BILIRUBINUR NEGATIVE 03/08/2017 2159   KETONESUR NEGATIVE 03/08/2017 2159   PROTEINUR NEGATIVE 03/08/2017 2159   UROBILINOGEN 0.2 08/31/2015 1035   NITRITE NEGATIVE 03/08/2017 2159   LEUKOCYTESUR MODERATE (A) 03/08/2017 2159     ) Recent Results (from the past 240 hour(s))  Urine culture     Status: Abnormal (Preliminary result)   Collection  Time: 03/08/17 10:02 PM  Result Value Ref Range Status   Specimen Description URINE, RANDOM  Final   Special Requests NONE  Final   Culture (A)  Final    >=100,000 COLONIES/mL ESCHERICHIA COLI SUSCEPTIBILITIES TO FOLLOW    Report Status PENDING  Incomplete      Anti-infectives    Start     Dose/Rate Route Frequency Ordered Stop   03/09/17 0245  cefTRIAXone (ROCEPHIN) 1 g in dextrose 5 % 50 mL IVPB     1 g 100 mL/hr over 30 Minutes  Intravenous Every 24 hours 03/09/17 0237         Radiology Studies: Dg Chest 2 View  Result Date: 03/08/2017 CLINICAL DATA:  Syncope.  Head trauma EXAM: CHEST  2 VIEW COMPARISON:  08/24/16 FINDINGS: There is moderate cardiac enlargement. Aortic atherosclerosis. Small left pleural effusion identified. Pulmonary vascular congestion is identified. No airspace opacities. IMPRESSION: 1. Cardiac enlargement aortic atherosclerosis. 2. Small left pleural effusion pulmonary vascular congestion Electronically Signed   By: Kerby Moors M.D.   On: 03/08/2017 21:52   Ct Head Wo Contrast  Result Date: 03/08/2017 CLINICAL DATA:  Generalized weakness.  Fall. EXAM: CT HEAD WITHOUT CONTRAST TECHNIQUE: Contiguous axial images were obtained from the base of the skull through the vertex without intravenous contrast. COMPARISON:  05/19/2015 FINDINGS: Brain: No evidence of acute infarction, hemorrhage, hydrocephalus, extra-axial collection or mass lesion/mass effect. There is patchy low attenuation throughout the subcortical and periventricular white matter compatible with chronic small vessel ischemic disease. Prominence of the sulci and ventricles noted compatible with brain atrophy. Vascular: No hyperdense vessel or unexpected calcification. Skull: Normal. Negative for fracture or focal lesion. Sinuses/Orbits: The paranasal sinuses are clear. There is partial opacification of the right mastoid air cells. Other: None. IMPRESSION: 1. Small vessel ischemic disease brain atrophy. 2. No  acute intracranial abnormalities. Electronically Signed   By: Kerby Moors M.D.   On: 03/08/2017 22:00   Mr Brain Wo Contrast  Result Date: 03/09/2017 CLINICAL DATA:  Seizure.  Three falls on Sunday. EXAM: MRI HEAD WITHOUT CONTRAST TECHNIQUE: Multiplanar, multiecho pulse sequences of the brain and surrounding structures were obtained without intravenous contrast. COMPARISON:  Head CT from yesterday FINDINGS: Brain: No acute infarction, hemorrhage, hydrocephalus, extra-axial collection or mass lesion. Small remote right frontal infarct affecting cortex and white matter. No other focal cortical finding to explain seizure. There is generalized atrophy in keeping with patient age. Chronic small-vessel disease with confluent ischemic gliosis in the cerebral white matter. Symmetric unremarkable hippocampal appearance for age. Foci of susceptibility at the bilateral dentate nuclei, likely physiologic. No generalized hemorrhagic injuries. Vascular: Normal flow voids. Skull and upper cervical spine: No marrow lesion noted. Advanced cervical spine disc degeneration. Sinuses/Orbits: Bilateral mastoid opacification which had a chronic appearance by CT. Clear paranasal sinuses. Bilateral cataract resection. IMPRESSION: 1. No acute finding. 2. Small remote right frontal infarct. No other cortical finding to explain seizure. 3. Chronic small vessel disease that is confluent in the deep cerebral white matter. Electronically Signed   By: Monte Fantasia M.D.   On: 03/09/2017 12:47        Scheduled Meds: . atorvastatin  20 mg Oral Daily  . cefTRIAXone (ROCEPHIN)  IV  1 g Intravenous Q24H  . gabapentin  300 mg Oral Daily  . [START ON 03/11/2017] metolazone  2.5 mg Oral Once per day on Wed Sat  . metoprolol succinate  25 mg Oral Daily  . pantoprazole  40 mg Oral BID  . potassium chloride SA  40 mEq Oral QPM  . potassium chloride SA  60 mEq Oral Daily  . sodium chloride flush  3 mL Intravenous Q12H  . warfarin  3 mg  Oral q1800  . Warfarin - Pharmacist Dosing Inpatient   Does not apply q1800   Continuous Infusions: . sodium chloride 50 mL/hr at 03/10/17 1121     LOS: 0 days    Time spent: 25 min    [place], DO [facility] Pager [phone]  If 7PM-7AM, please contact night-coverage [email or web address] Password  [email or web address] 03/10/2017, 1:56 PM

## 2017-03-10 NOTE — Care Management Note (Signed)
Case Management Note  Patient Details  Name: SIYAH MAULT MRN: [id number] Date of Birth: [birth year]  Subjective/Objective:             Pt presents s/p fall, generalized weakness. From [facility].  [name] [place]iston (Daughter) Roni Bread ([place])    [phone]      Action/Plan: Return to facility when medically stable. CM to f/u with disposition needs.  Expected Discharge Date:                  Expected Discharge Plan:  [place] ([place])  In-House Referral:  Clinical Social Work  Discharge planning Services  CM Consult  Post Acute Care Choice:    Choice offered to:     DME Arranged:    DME Agency:     HH Arranged:    HH Agency:     Status of Service:  In process, will continue to follow  If discussed at Long Length of Stay Meetings, dates discussed:    Additional Comments:  Sharin Mons, RN 03/10/2017, 4:18 PM

## 2017-03-10 NOTE — Progress Notes (Signed)
qPhysical Therapy Treatment Patient Details Name: [name] MRN: [id number] DOB: 04-Dec-1926 Today's Date: 03/10/2017    History of Present Illness [name] is an 83 y.o. female who presented after having 3 falls on Sunday, in conjunction with generalized weakness. Family also reported absence-like spells. The patient has no prior history of seizures. PMH:  CHF, DM, HTN, neuropathy, syncope, UTI, cholecystitis, UTI, chronic afib.    PT Comments    Pt limited this session by dizziness and decreased responsiveness on both attempts at out of bed mobility.  Pt transitioned to EOB and c/o dizziness with first attempt at standing, upon returning to sitting EOB PT noted pt's BUEs with ?myoclonic jerks which progressed to BLEs.  Pt returned to supine and minimally responsive x10 seconds, RN alerted and vitals assessed (BP WNL, HR tacchy up to 126).  Pt returned to sitting for orthostatic BP assessment (124/96 in sitting).  Stand/pivot to [facility] with mod assist and pt unable to remain standing until BP registered (registered at 120/106 once sitting on BSC).  After voiding, pt with decreased responsiveness to voice, touch, and sternal rub.  Pt was able to be aroused by bilateral supraclavicular pressure, noted to be diaphoretic and HR tacchy up to 180s.  RN present in room and called for staff assistance, pt returned to bed with +2 assist for safety, and left in care of nursing staff.     Follow Up Recommendations  SNF;Supervision/Assistance - 24 hour     Equipment Recommendations  None recommended by PT    Recommendations for Other Services       Precautions / Restrictions Precautions Precautions: Fall (?vagal episode during session) Restrictions Weight Bearing Restrictions: No    Mobility  Bed Mobility Overal bed mobility: Needs Assistance Bed Mobility: Supine to Sit;Sit to Supine     Supine to sit: Mod assist Sit to supine: +2 for physical assistance (2 times during session,  required +2 due to vagal event)   General bed mobility comments: Needs assist on first attempt with BLEs and trunk elevation, on second attempt only needs assist for 1 LE and trunk elevation.  Requires +2 on both attempts to return to supine 2/2 vagal event  Transfers Overall transfer level: Needs assistance Equipment used: 1 person hand held assist Transfers: Stand Pivot Transfers;Sit to/from Stand Sit to Stand: Mod assist Stand pivot transfers: Mod assist       General transfer comment: Pt stood with RW on first attempt, mod assist to rise and max cues for hand placement.  Mod assist to stand/pivot from EOB to [facility]  Ambulation/Gait                 Stairs            Wheelchair Mobility    Modified Rankin (Stroke Patients Only)       Balance                                            Cognition Arousal/Alertness: Awake/alert Behavior During Therapy: WFL for tasks assessed/performed Overall Cognitive Status: Within Functional Limits for tasks assessed                                        Exercises      General Comments  Pertinent Vitals/Pain Pain Assessment: No/denies pain    Home Living                      Prior Function            PT Goals (current goals can now be found in the care plan section) Progress towards PT goals: Progressing toward goals    Frequency    Min 2X/week      PT Plan Current plan remains appropriate    Co-evaluation             End of Session Equipment Utilized During Treatment: Oxygen Activity Tolerance: Treatment limited secondary to medical complications (Comment) Patient left: in bed;with call bell/phone within reach;with bed alarm set;with nursing/sitter in room;with family/visitor present Nurse Communication: Mobility status PT Visit Diagnosis: Unsteadiness on feet (R26.81);Muscle weakness (generalized) (M62.81)     Time: 3818-4037 PT Time  Calculation (min) (ACUTE ONLY): 38 min  Charges:  $Therapeutic Activity: 38-52 mins                    G Codes:  Functional Assessment Tool Used: AM-PAC 6 Clicks Basic Mobility Functional Limitation: Mobility: Walking and moving around Mobility: Walking and Moving Around Current Status (V4360): At least 60 percent but less than 80 percent impaired, limited or restricted Mobility: Walking and Moving Around Goal Status [phone]): At least 1 percent but less than 20 percent impaired, limited or restricted    [name], PT, DPT 03/10/17 12:17 PM

## 2017-03-10 NOTE — Care Management Obs Status (Signed)
[place] NOTIFICATION   Patient Details  Name: [name] MRN: [id number] Date of Birth: 1926/11/25   Medicare Observation Status Notification Given:  Yes (Pt asked CM to call and explain MOON to daughter [name]. Pt declined to sign.CM spoke with daughter [name] and MOON was explained to daughter.Marland KitchenMOON left @ bedside)    Sharin Mons, RN 03/10/2017, 4:34 PM

## 2017-03-10 NOTE — Consult Note (Signed)
   [facility] CM Inpatient Consult   03/10/2017  HAVEN PYLANT [birth year] [id number]   Came by to see patient. Cardiologist in with patient.  Will follow up regarding restart of [facility] services.  Noted PT is recommending a skilled facility stay after hospital discharge. After MD left spoke with the patient at the bedside.  Patient states she is planning to return to rehab until her time is up.  She states she is not sure of how much time she has.  Patient given a brochure with contact information.  She states she will give it to her daughter who she will return home to after her rehab. Patient is still unsure of what caused her to pass out or to fall. For questions, please contact;  Natividad Brood, RN BSN [facility] Liaison  [phone] business mobile phone Toll free office [phone]

## 2017-03-10 NOTE — Progress Notes (Signed)
Patient has had no further events overnight. As noted in previous note EEG has been completed and showed mild to moderate diffuse generalized slowing with no epileptiform abnormalities. MRI scan of the brain showed no acute pathology with evidence of remote right frontal lobe infarct and chronic small vessel ischemic changes. She has been doing better since she has been IV resuscitation. Discussion was had with the primary doctor and at this time and no further neurological diagnostic tests are recommended. At this time also we will hold on antiepileptic medications. At this time neurology will sign off thank you very much for this consultation.  Etta Quill PA-C [facility] [phone]  M-F  (8:30 am- 4 PM)  03/10/2017, 9:31 AM

## 2017-03-10 NOTE — Consult Note (Signed)
Cardiology Consult    Patient ID: [name] MRN: [id number], DOB/AGE: 03/10/1926   Admit date: 03/08/2017 Date of Consult: 03/10/2017  Primary Physician: Maximino Greenland, MD Primary Cardiologist: Dr. Aundra Dubin Requesting Provider: Dr. Eliseo Squires  Reason for Consult: syncope  Patient Profile    [name] is a 83 yo female with a PMH significant for chronic atrial fibrillation on coumadin, chronic diastolic heart failure, CKD stage IV, DM, and hx of syncope, was brought to [facility] from SNF after three falls on 03/08/17. Cardiology asked to consult for medication management and syncopal episodes.  [name] is a 83 y.o. female who is being seen today for the evaluation of syncope at the request of Dr. Eliseo Squires.   Past Medical History   Past Medical History:  Diagnosis Date  . Anemia   . Arthritis   . Atrial fibrillation ([place])   . Breast cancer ([place]) 2012   right  . CHF (congestive heart failure) ([place])   . Cholelithiasis   . DCIS (ductal carcinoma in situ) of breast 07/15/2010   DCIS s/p lumpectomy and radiation 2011  . Diabetes mellitus   . DVT (deep venous thrombosis) ([place])   . GERD (gastroesophageal reflux disease)   . GI bleed   . Heart murmur   . Hyperlipidemia   . Hypertension   . Peripheral neuropathy ([place])   . Shortness of breath dyspnea   . Syncope 05/09/2015    Past Surgical History:  Procedure Laterality Date  . BREAST SURGERY    . CARDIAC CATHETERIZATION N/A 05/11/2015   Procedure: Left Heart Cath and Coronary Angiography;  Surgeon: [name], MD;  Location: [place] [facility];  Service: Cardiovascular;  Laterality: N/A;  . CARDIAC CATHETERIZATION N/A 08/09/2015   Procedure: Right Heart Cath;  Surgeon: [name], MD;  Location: [place] [facility];  Service: Cardiovascular;  Laterality: N/A;  . CATARACT EXTRACTION Left 05/08/2015  . ENDOSCOPIC RETROGRADE CHOLANGIOPANCREATOGRAPHY (ERCP) WITH PROPOFOL  10/14/2012   Procedure: ENDOSCOPIC RETROGRADE  CHOLANGIOPANCREATOGRAPHY (ERCP) WITH PROPOFOL;  Surgeon: Milus Banister, MD;  Location: [facility];  Service: Endoscopy;  Laterality: N/A;  . ESOPHAGOGASTRODUODENOSCOPY  09/01/2012   Procedure: ESOPHAGOGASTRODUODENOSCOPY (EGD);  Surgeon: Ladene Artist, MD,FACG;  Location: [facility];  Service: Endoscopy;  Laterality: N/A;  . ESOPHAGOGASTRODUODENOSCOPY  09/03/2012   Procedure: ESOPHAGOGASTRODUODENOSCOPY (EGD);  Surgeon: Inda Castle, MD;  Location: [facility];  Service: Endoscopy;  Laterality: N/A;  . EUS  10/14/2012   Procedure: UPPER ENDOSCOPIC ULTRASOUND (EUS) LINEAR;  Surgeon: Milus Banister, MD;  Location: [facility];  Service: Endoscopy;  Laterality: N/A;  . EUS  11/11/2012   Procedure: UPPER ENDOSCOPIC ULTRASOUND (EUS) LINEAR;  Surgeon: Milus Banister, MD;  Location: [facility];  Service: Endoscopy;  Laterality: N/A;     Allergies  Allergies  Allergen Reactions  . Peach [Prunus Persica] Rash    AGENT: peach fuzz    History of Present Illness    [name] has a history of syncopal episodes. In 2016, she was hospitalized after a syncopal episode and underwent cardiac catheterization that showed nonobstructive disease and 3+ MR. Echo at that time showed grade 3 DD and severe pulmonary hypertension. Event monitor in 2016 showed chronic Afib, but no other abnormalities. Right heart cath in 2016 with elevated PA pressure.  She was again admitted Sept 2017 for syncopal episode with neurological workup, but no medication changes at that time. She was last seen by Dr. Aundra Dubin on 01/26/17 and was found  to be in her usual state of health, doing well on her lasix regimen. At that time, she was on 80 mg torsemide in the AM and 60 mg in the PM (KCL 60 mEq in AM and 40 mEq in PM); she was switched from lopressor to toprol XL 25 for marginal BP.  She was brought to [facility] 03/08/17 after three falls at her rehab center. Pt apparently "blacked out" and fell when trying to get on the commode. She  may have hit her head on the left side. Neurology was consulted for possible seizures. EEG without clear signs of seizure activity, no medication changes at this time. Per the family, she fell a total of 3 times. She is on coumadin for chronic Afib. Head CT and brain MRI without hemorrhage.   On my interview, family was not present. Pt states she had three falls at the SNF. One fall was when she stood from the commode - the room started spinning, everything went black, she became hot, and woke up on the floor. The other two occasions occurred when she was ambulating from the commode back to bed. She states she was almost back to bed, and then had a fainting spell on two occasions. She denies chest pain, palpitations, SOB, nausea, and vomiting. She states she gets very hot and then passes out.    Inpatient Medications    . atorvastatin  20 mg Oral Daily  . cefTRIAXone (ROCEPHIN)  IV  1 g Intravenous Q24H  . gabapentin  300 mg Oral Daily  . [START ON 03/11/2017] metolazone  2.5 mg Oral Once per day on Wed Sat  . metoprolol succinate  25 mg Oral Daily  . pantoprazole  40 mg Oral BID  . potassium chloride SA  40 mEq Oral QPM  . potassium chloride SA  60 mEq Oral Daily  . sodium chloride flush  3 mL Intravenous Q12H  . warfarin  3 mg Oral q1800  . Warfarin - Pharmacist Dosing Inpatient   Does not apply q1800     Outpatient Medications    Prior to Admission medications   Medication Sig Start Date End Date Taking? Authorizing Provider  acetaminophen (TYLENOL) 500 MG tablet Take 1,000 mg by mouth 2 (two) times daily as needed for mild pain.    Yes Historical Provider, MD  atorvastatin (LIPITOR) 20 MG tablet TAKE 1 TABLET BY MOUTH EVERY DAY 04/14/16  Yes [name], MD  gabapentin (NEURONTIN) 300 MG capsule Take 300 mg by mouth every morning.  04/09/15  Yes Historical Provider, MD  KLOR-CON M20 20 MEQ tablet TAKE 3 TABLETS IN THE AM AND 2 TABLETS IN THE PM 02/23/17  Yes Shaune Pascal Bensimhon, MD    metolazone (ZAROXOLYN) 2.5 MG tablet Take 2.5 mg by mouth See admin instructions. Takes 1 tab on wed and sat   Yes Historical Provider, MD  metoprolol succinate (TOPROL-XL) 25 MG 24 hr tablet Take 1 tablet (25 mg total) by mouth at bedtime. Patient taking differently: Take 25 mg by mouth every morning.  01/26/17  Yes [name], MD  OXYGEN Inhale 3 L into the lungs continuous.   Yes Historical Provider, MD  pantoprazole (PROTONIX) 40 MG tablet Take 1 tablet (40 mg total) by mouth 2 (two) times daily before a meal. Patient taking differently: Take 40 mg by mouth daily.  09/07/12  Yes [name], MD  potassium chloride SA (K-DUR,KLOR-CON) 20 MEQ tablet Take 2 tablets (40 mEq total) by mouth daily. Patient  taking differently: Take 40 mEq by mouth every evening.  01/26/17  Yes [name], MD  potassium chloride SA (K-DUR,KLOR-CON) 20 MEQ tablet Take 60 mEq by mouth every morning.   Yes Historical Provider, MD  torsemide (DEMADEX) 20 MG tablet Take 80 mg (4 tabs) in AM and 40 mg (2 tabs) in PM. Patient taking differently: Take 40-80 mg by mouth See admin instructions. Take 80 mg (4 tabs) in AM and 40 mg (2 tabs) in PM. 01/26/17  Yes [name], MD  warfarin (COUMADIN) 2 MG tablet Take 3 mg by mouth daily.     Historical Provider, MD     Family History    Family History  Problem Relation Age of Onset  . Hypertension Father     Social History    Social History   Social History  . Marital status: Widowed    Spouse name: N/A  . Number of children: 4  . Years of education: N/A   Occupational History  . RETIRED Retired   Social History Main Topics  . Smoking status: Never Smoker  . Smokeless tobacco: Never Used  . Alcohol use No  . Drug use: No  . Sexual activity: Not Currently   Other Topics Concern  . Not on file   Social History Narrative  . No narrative on file     Review of Systems    General:  No chills, fever, night sweats or weight changes.   Cardiovascular:  No chest pain, dyspnea on exertion, edema, orthopnea, palpitations, paroxysmal nocturnal dyspnea. Dermatological: No rash, lesions/masses Respiratory: No cough, dyspnea Urologic: No hematuria, dysuria Abdominal:   No nausea, vomiting, diarrhea, bright red blood per rectum, melena, or hematemesis Neurologic:  No visual changes, wkns, changes in mental status. Positive syncope All other systems reviewed and are otherwise negative except as noted above.  Physical Exam    Blood pressure 119/65, pulse 64, temperature 98.4 F (36.9 C), temperature source Oral, resp. rate 19, height 5\' 4"  (1.626 m), weight 233 lb 14.5 oz (106.1 kg), SpO2 100 %.  General: Pleasant, NAD Psych: Normal affect. Neuro: Alert and oriented X 3. Moves all extremities spontaneously. HEENT: Normal  Neck: Supple without bruits, + JVD. Lungs:  Resp regular and unlabored, CTA in upper lobes, occasional wheeze in lower lobes L > R Heart: Irregular rhythm, regular rate no murmurs. Abdomen: Soft, non-tender, non-distended, BS + x 4.  Extremities: No clubbing, cyanosis, Trace edema. DP/PT/Radials 1+ and equal bilaterally.  Labs    Troponin (Point of Care Test) No results for input(s): TROPIPOC in the last 72 hours.  Recent Labs  03/08/17 2100 03/09/17 0055 03/09/17 0537  TROPONINI 0.04* 0.03* 0.03*   Lab Results  Component Value Date   WBC 7.1 03/10/2017   HGB 9.2 (L) 03/10/2017   HCT 29.5 (L) 03/10/2017   MCV 89.4 03/10/2017   PLT 174 03/10/2017    Recent Labs Lab 03/08/17 2100  03/10/17 0800  NA 136  < > 137  K 4.3  < > 3.6  CL 85*  < > 85*  CO2 35*  < > 40*  BUN 34*  < > 29*  CREATININE 2.40*  < > 1.93*  CALCIUM 9.2  < > 8.7*  PROT 7.9  --   --   BILITOT 0.8  --   --   ALKPHOS 78  --   --   ALT 10*  --   --   AST 26  --   --  GLUCOSE 187*  < > 166*  < > = values in this interval not displayed. No results found for: CHOL, HDL, LDLCALC, TRIG Lab Results  Component Value Date    DDIMER 3.63 (H) 09/03/2012     Radiology Studies    Dg Chest 2 View  Result Date: 03/08/2017 CLINICAL DATA:  Syncope.  Head trauma EXAM: CHEST  2 VIEW COMPARISON:  08/24/16 FINDINGS: There is moderate cardiac enlargement. Aortic atherosclerosis. Small left pleural effusion identified. Pulmonary vascular congestion is identified. No airspace opacities. IMPRESSION: 1. Cardiac enlargement aortic atherosclerosis. 2. Small left pleural effusion pulmonary vascular congestion Electronically Signed   By: Kerby Moors M.D.   On: 03/08/2017 21:52   Ct Head Wo Contrast  Result Date: 03/08/2017 CLINICAL DATA:  Generalized weakness.  Fall. EXAM: CT HEAD WITHOUT CONTRAST TECHNIQUE: Contiguous axial images were obtained from the base of the skull through the vertex without intravenous contrast. COMPARISON:  05/19/2015 FINDINGS: Brain: No evidence of acute infarction, hemorrhage, hydrocephalus, extra-axial collection or mass lesion/mass effect. There is patchy low attenuation throughout the subcortical and periventricular white matter compatible with chronic small vessel ischemic disease. Prominence of the sulci and ventricles noted compatible with brain atrophy. Vascular: No hyperdense vessel or unexpected calcification. Skull: Normal. Negative for fracture or focal lesion. Sinuses/Orbits: The paranasal sinuses are clear. There is partial opacification of the right mastoid air cells. Other: None. IMPRESSION: 1. Small vessel ischemic disease brain atrophy. 2. No acute intracranial abnormalities. Electronically Signed   By: Kerby Moors M.D.   On: 03/08/2017 22:00   Mr Brain Wo Contrast  Result Date: 03/09/2017 CLINICAL DATA:  Seizure.  Three falls on Sunday. EXAM: MRI HEAD WITHOUT CONTRAST TECHNIQUE: Multiplanar, multiecho pulse sequences of the brain and surrounding structures were obtained without intravenous contrast. COMPARISON:  Head CT from yesterday FINDINGS: Brain: No acute infarction, hemorrhage,  hydrocephalus, extra-axial collection or mass lesion. Small remote right frontal infarct affecting cortex and white matter. No other focal cortical finding to explain seizure. There is generalized atrophy in keeping with patient age. Chronic small-vessel disease with confluent ischemic gliosis in the cerebral white matter. Symmetric unremarkable hippocampal appearance for age. Foci of susceptibility at the bilateral dentate nuclei, likely physiologic. No generalized hemorrhagic injuries. Vascular: Normal flow voids. Skull and upper cervical spine: No marrow lesion noted. Advanced cervical spine disc degeneration. Sinuses/Orbits: Bilateral mastoid opacification which had a chronic appearance by CT. Clear paranasal sinuses. Bilateral cataract resection. IMPRESSION: 1. No acute finding. 2. Small remote right frontal infarct. No other cortical finding to explain seizure. 3. Chronic small vessel disease that is confluent in the deep cerebral white matter. Electronically Signed   By: Monte Fantasia M.D.   On: 03/09/2017 12:47    ECG & Cardiac Imaging    EKG 03/08/17: Afib, ventricular rate 125 bpm  Echocardiogram 08/25/16: Study Conclusions - Left ventricle: The cavity size was normal. There was mild focal   basal hypertrophy of the septum. Systolic function was normal.   The estimated ejection fraction was in the range of 60% to 65%.   Wall motion was normal; there were no regional wall motion   abnormalities. Doppler parameters are consistent with high   ventricular filling pressure. - Aortic valve: Trileaflet; normal thickness, mildly calcified   leaflets. - Mitral valve: Calcified annulus. There was mild regurgitation. - Right ventricle: The cavity size was mildly dilated. Wall   thickness was normal. - Right atrium: The atrium was moderately dilated. - Tricuspid valve: There was moderate regurgitation. -  Pulmonary arteries: PA peak pressure: 83 mm Hg (S).  Impressions: - The right ventricular  systolic pressure was increased consistent   with severe pulmonary hypertension.  Assessment & Plan    1. Syncopal episodes - pt in permanent Afib - orthostatics checked, BP WNL - telemetry with Afib in the 90-110s, with bouts in the 140s when she is ambulating to bedside commode  2. Diastolic heart failure - CXR with cardiomegaly, left pleural effusion, vascular congestion - home med list includes zaroxolyn on Wed and Sat, which was held during her last visit with Dr. Aundra Dubin - would recommend D/C zaroxolyn for now. Decrease torsemide to 60 mg AM, 40 mg PM - pressures have tolerated this regimen - wt today is 233 lbs (228 lbs on admission and was 228 lbs at last cardiology clinic visit on 01/26/17), but she does not appear extremely volume overloaded on exam  3. Chronic atrial fibrillation  - rate controlled on toprol XL 25 mg and AC with coumadin - INR 2.25 - head CT and brain MRI without hemorrhage, continue coumadin This patients CHA2DS2-VASc Score and unadjusted Ischemic Stroke Rate (% per year) is equal to 9.7 % stroke rate/year from a score of 6 (female, age, CHF, DM, CAD)  4. DM - PO DM meds have been D/C'ed for hypoglycemia - per primary team  5. CKD Stage IV - sCr 1.93 (2.17); baseline difficult to assess - needs stricts I&Os  6. Elevated troponin (0.04 -> 0.03) likely secondary to diastolic heart failure. She denies chest pain, EKG without ischemic changes; no ischemic workup at this time.   I suspect these syncopal episodes may be secondary to increased heart rate with activity (going to the commode) and subsequent drop in pressure. Would recommend increasing toprol to 50 mg XL for better rate control, D/C zaroxolyn, and decrease torsemide to 60 mg qAM and 40 mg qPM. Continue coumadin, but discouraged ambulation without assistance. Ordered TED hose.   Signed, Ledora Bottcher, PA-C 03/10/2017, 3:48 PM [phone]  83 year old female with past medical history of  permanent atrial fibrillation, chronic diastolic congestive heart failure, chronic stage IV kidney disease, diabetes mellitus who Dr. Eliseo Squires has asked [place] to evaluate for syncope and atrial fibrillation. Last echocardiogram September 2017 showed normal LV function, mild mitral regurgitation, mild right ventricular enlargement, moderate right atrial enlargement, moderate tricuspid regurgitation and severely elevated pulmonary pressure. Patient has had 3 recent falls. She states she urinated and stood and as she was walking became weak and dizzy resulting in a fall. No preceding chest pain, palpitations or nausea. Neurology workup negative.   MRI shows small chronic infarct in the right frontal lobe and small vessel disease. Initial BUN 34 with creatinine 2.40. Troponin 0.04, 0.03 and 0.03. BNP 278. Hemoglobin 9.8. Electrocardiogram shows atrial fibrillation with rapid ventricular response and inferior lateral T-wave inversion.  1 syncope/falls-symptoms typically occur after initially ambulating and sound likely orthostatic mediated. LV function is normal. We will discontinue metolazone and decrease Demadex to 60 mg in the morning and 40 mg in the evening beginning tomorrow. She will need to be followed closely as an outpatient for volume excess.  2 chronic diastolic congestive heart failure-decrease diuretics as outlined above. Follow for volume excess. Discontinue IV fluids.  3 permanent atrial fibrillation-patient's heart rate is running high. Increase Toprol to 50 mg daily and follow. Continue Coumadin for now. If she has more frequent falls in the future would need to consider discontinuing as risk would outweigh benefit. CHADSvasc however 8 (  CVA noted on MRI). Would like to continue if possible.  4 acute on chronic stage IV kidney disease-some improvement with hydration. Would not hydrate further given baseline chronic diastolic dysfunction. Resume diuretics tomorrow as outlined above. She will need close  follow-up of her renal function after discharge.  5 UTI-antibiotics per primary care.  6 minimal elevation in troponin-no chest pain and no clear trend. Not consistent with acute coronary syndrome. No further ischemia evaluation.  Pt can likely be DCed in AM and FU with Dr Aundra Dubin as scheduled.  Kirk Ruths MD

## 2017-03-11 LAB — CBC
HEMATOCRIT: 31.1 % — AB (ref 36.0–46.0)
Hemoglobin: 9.4 g/dL — ABNORMAL LOW (ref 12.0–15.0)
MCH: 26.9 pg (ref 26.0–34.0)
MCHC: 30.2 g/dL (ref 30.0–36.0)
MCV: 88.9 fL (ref 78.0–100.0)
PLATELETS: 176 10*3/uL (ref 150–400)
RBC: 3.5 MIL/uL — AB (ref 3.87–5.11)
RDW: 16.3 % — ABNORMAL HIGH (ref 11.5–15.5)
WBC: 6.3 10*3/uL (ref 4.0–10.5)

## 2017-03-11 LAB — BASIC METABOLIC PANEL
Anion gap: 13 (ref 5–15)
BUN: 29 mg/dL — AB (ref 6–20)
CO2: 39 mmol/L — AB (ref 22–32)
Calcium: 8.7 mg/dL — ABNORMAL LOW (ref 8.9–10.3)
Chloride: 85 mmol/L — ABNORMAL LOW (ref 101–111)
Creatinine, Ser: 1.91 mg/dL — ABNORMAL HIGH (ref 0.44–1.00)
GFR calc Af Amer: 26 mL/min — ABNORMAL LOW (ref >60)
GFR, EST NON AFRICAN AMERICAN: 22 mL/min — AB (ref >60)
GLUCOSE: 168 mg/dL — AB (ref 65–99)
POTASSIUM: 3.1 mmol/L — AB (ref 3.5–5.1)
Sodium: 137 mmol/L (ref 135–145)

## 2017-03-11 LAB — URINE CULTURE

## 2017-03-11 LAB — PROTIME-INR
INR: 2.21
Prothrombin Time: 24.9 seconds — ABNORMAL HIGH (ref 11.4–15.2)

## 2017-03-11 LAB — MAGNESIUM: Magnesium: 1.8 mg/dL (ref 1.7–2.4)

## 2017-03-11 MED ORDER — METOPROLOL SUCCINATE ER 50 MG PO TB24: 50.0000 mg | ORAL_TABLET | Freq: Every day | ORAL | Status: AC

## 2017-03-11 MED ORDER — TORSEMIDE 20 MG PO TABS: 40.0000 mg | ORAL_TABLET | Freq: Every day | ORAL | Status: AC

## 2017-03-11 MED ORDER — TORSEMIDE 20 MG PO TABS: 60.0000 mg | ORAL_TABLET | Freq: Every morning | ORAL | Status: AC

## 2017-03-11 MED ORDER — MAGNESIUM SULFATE 2 GM/50ML IV SOLN
2.0000 g | Freq: Once | INTRAVENOUS | Status: AC
  Administered 2017-03-11: 2 g via INTRAVENOUS
  Filled 2017-03-11: qty 50

## 2017-03-11 MED ORDER — GABAPENTIN 300 MG PO CAPS: 300.0000 mg | ORAL_CAPSULE | Freq: Every day | ORAL | 3 refills | Status: AC

## 2017-03-11 NOTE — Clinical Social Work Note (Addendum)
CSW consulted for transportation back to [facility]. CSW sent clinicals to [facility] and communicated with [facility] (admissions) for bed confirmation. Per Juliann Pulse, going to need to get insurance reauth and accept pt tomorrow morning due to time of day. (6:05pm) CSW informed RN Ginger.   CSW left voicemails for dtrs-[name] [place] [phone]) and Thomasena Edis [phone]). CSW called granddaughter Roni Bread [phone]). CSW confirmed that pt will return to [facility]. CSW informed that pt will d/c tomorrow.  Granddaughter provided another number for C. Lauro Regulus [phone]) to be reached tomorrow when pt ready for d/c. Granddaughter updated C. McBride of d/c tomorrow. CSW will continue to follow and facilitate d/c needs.    Oretha Ellis, Latanya Presser, [place] Social Worker  [phone]

## 2017-03-11 NOTE — Progress Notes (Signed)
ANTICOAGULATION CONSULT NOTE - Follow-up Consult  Pharmacy Consult for warfarin Indication: atrial fibrillation  Allergies  Allergen Reactions  . Peach [Prunus Persica] Rash    AGENT: peach fuzz    Patient Measurements: Height: 5\' 4"  (162.6 cm) Weight: 236 lb 5.3 oz (107.2 kg) IBW/kg (Calculated) : 54.7  Vital Signs: Temp: 98.9 F (37.2 C) (04/04 0516) Temp Source: Oral (04/04 0516) BP: 111/63 (04/04 0945) Pulse Rate: 67 (04/04 0945)  Labs:  Recent Labs  03/08/17 2100 03/09/17 0055 03/09/17 0537 03/10/17 0537 03/10/17 0800 03/11/17 0549 03/11/17 0816  HGB 9.8*  --  9.2*  --  9.2*  --  9.4*  HCT 31.8*  --  30.4*  --  29.5*  --  31.1*  PLT 190  --  177  --  174  --  176  LABPROT 26.5*  --   --  25.3*  --  24.9*  --   INR 2.39  --   --  2.25  --  2.21  --   CREATININE 2.40*  --  2.17*  --  1.93*  --  1.91*  TROPONINI 0.04* 0.03* 0.03*  --   --   --   --     Estimated Creatinine Clearance: 24.3 mL/min (A) (by C-G formula based on SCr of 1.91 mg/dL (H)).   Medical History: Past Medical History:  Diagnosis Date  . Anemia   . Arthritis   . Atrial fibrillation ([place])   . Breast cancer ([place]) 2012   right  . CHF (congestive heart failure) ([place])   . Cholelithiasis   . DCIS (ductal carcinoma in situ) of breast 07/15/2010   DCIS s/p lumpectomy and radiation 2011  . Diabetes mellitus   . DVT (deep venous thrombosis) ([place])   . GERD (gastroesophageal reflux disease)   . GI bleed   . Heart murmur   . Hyperlipidemia   . Hypertension   . Peripheral neuropathy ([place])   . Shortness of breath dyspnea   . Syncope 05/09/2015    Assessment: 83 y.o. female admitted with falls/syncope, h/o Afib, to continue warfarin from PTA. INR remains therapeutic, cbc stable, no bleeding noted.  Home dose warfarin 3 mg daily  Goal of Therapy:  INR 2-3 Monitor platelets by anticoagulation protocol: Yes   Plan:  Continue warfarin 3 mg daily Monitor daily INR, CBC, clinical course,  s/sx of bleed, PO intake, DDI   Thank you for allowing [place] to participate in this patients care.  [employer], Pharm.D., BCPS Clinical Pharmacist Pager [phone] 03/11/2017 11:25 AM

## 2017-03-11 NOTE — Discharge Summary (Signed)
Physician Discharge Summary  [name] WER:[id number] DOB: [birth year] DOA: 03/08/2017  PCP: Maximino Greenland, MD  Admit date: 03/08/2017 Discharge date: 03/11/2017   Recommendations for Outpatient Follow-Up:   1. TED hose when out of bed 2. Fall risk-- patient is having syncopal events due to orthostatic hypotension- will need to be closely monitored while up and will need to get up VERY slowly 3. Weigh daily 4. INR 1 week   Discharge Diagnosis:   Principal Problem:   Falls Active Problems:   Chronic atrial fibrillation- CHADs VASc =6   Lower urinary tract infectious disease   Syncope   CKD (chronic kidney disease) stage 4, GFR 15-29 ml/min (HCC)   Diabetes mellitus type 2, uncontrolled (HCC)   Elevated troponin   Seizure [facility])   Discharge disposition:  SNF:  Discharge Condition: Improved.  Diet recommendation: Low sodium, heart healthy  Wound care: None.   History of Present Illness:   [name] is a 83 y.o. female with medical history significant of diastolic CHF, MVR, PAH, HLD, oxygen dependent on 3 L, syncope, CKD stage IV; who presents after having 3 falls yesterday at [place]. Much of history is obtained from report.  Patient notes that she fell and hit her head on the left side while trying to get on the commode. She is not able to recall all 3 falls. She states that she gets very weak when ever she is standing for too long. Associated symptoms include shortness of breath with exertion and left leg pain following the fall. Family gives report of patient having staring spells where she will have a blank stare and be unresponsive and have right hand shaking. Thereafter patient has been known to have vomiting and be drowsy for a period of time prior to returning back to her normal self. Per the family these episodes have been increasing in frequency. Patient denies having any headache, nausea, chest pain, focal weakness, or change in vision.  Patient is scheduled to see a neurologist on April 11 due to the question of these seizure like episodes.   Hospital Course by Problem:   Syncopal spells due to orthostatic hypotension -HR increases in atrial fib- increase metoprolol per cardiology -seizures have essentially been ruled out with EEG/MRI-- no medication prescribed for seizures -episodes happen on bedside commode per patient- patient had episode in hospital today when working with PT due to orthostasis -TED hose -it appears this happened last year as well-- patient was thought to be dehydrated, given IVF and diuretics decreased.  Followed with CHF team and they increased medications as an outaptient due to dyspnea -will be difficult to find the correct regimen for patient -cardiology consult: syncope/falls-symptoms typically occur after initially ambulating and sound likely orthostatic mediated. LV function is normal. We will discontinue metolazone and decrease Demadex to 60 mg in the morning and 40 mg in the evening beginning tomorrow. She will need to be followed closely as an outpatient for volume excess.  Atrial fib- [place] vasc 2: 8 -metoprolol increase -continue coumadin although if continues to fall may need to stop  AKI on CKD- stage IV -BMP PRN  UTI -ecoli -treated with rocephin  Chronic respiratory failure -wean O2 to keep sats 90-95% -patinet was 100% on 3L  Morbid obesity -encourage weight loss    Medical Consultants:    cardiology   Discharge Exam:   Vitals:   03/11/17 0945 03/11/17 1419  BP: 111/63 (!) 102/54  Pulse: 67 88  Resp:  20  Temp:  98.5 F (36.9 C)   Vitals:   03/11/17 0516 03/11/17 0652 03/11/17 0945 03/11/17 1419  BP: 100/62  111/63 (!) 102/54  Pulse: (!) 59  67 88  Resp: 18   20  Temp: 98.9 F (37.2 C)   98.5 F (36.9 C)  TempSrc: Oral   Oral  SpO2: 100%   100%  Weight:  107.2 kg (236 lb 5.3 oz)    Height:        Gen:  NAD    The results of significant  diagnostics from this hospitalization (including imaging, microbiology, ancillary and laboratory) are listed below for reference.     Procedures and Diagnostic Studies:   Dg Chest 2 View  Result Date: 03/08/2017 CLINICAL DATA:  Syncope.  Head trauma EXAM: CHEST  2 VIEW COMPARISON:  08/24/16 FINDINGS: There is moderate cardiac enlargement. Aortic atherosclerosis. Small left pleural effusion identified. Pulmonary vascular congestion is identified. No airspace opacities. IMPRESSION: 1. Cardiac enlargement aortic atherosclerosis. 2. Small left pleural effusion pulmonary vascular congestion Electronically Signed   By: Kerby Moors M.D.   On: 03/08/2017 21:52   Ct Head Wo Contrast  Result Date: 03/08/2017 CLINICAL DATA:  Generalized weakness.  Fall. EXAM: CT HEAD WITHOUT CONTRAST TECHNIQUE: Contiguous axial images were obtained from the base of the skull through the vertex without intravenous contrast. COMPARISON:  05/19/2015 FINDINGS: Brain: No evidence of acute infarction, hemorrhage, hydrocephalus, extra-axial collection or mass lesion/mass effect. There is patchy low attenuation throughout the subcortical and periventricular white matter compatible with chronic small vessel ischemic disease. Prominence of the sulci and ventricles noted compatible with brain atrophy. Vascular: No hyperdense vessel or unexpected calcification. Skull: Normal. Negative for fracture or focal lesion. Sinuses/Orbits: The paranasal sinuses are clear. There is partial opacification of the right mastoid air cells. Other: None. IMPRESSION: 1. Small vessel ischemic disease brain atrophy. 2. No acute intracranial abnormalities. Electronically Signed   By: Kerby Moors M.D.   On: 03/08/2017 22:00   Mr Brain Wo Contrast  Result Date: 03/09/2017 CLINICAL DATA:  Seizure.  Three falls on Sunday. EXAM: MRI HEAD WITHOUT CONTRAST TECHNIQUE: Multiplanar, multiecho pulse sequences of the brain and surrounding structures were obtained without  intravenous contrast. COMPARISON:  Head CT from yesterday FINDINGS: Brain: No acute infarction, hemorrhage, hydrocephalus, extra-axial collection or mass lesion. Small remote right frontal infarct affecting cortex and white matter. No other focal cortical finding to explain seizure. There is generalized atrophy in keeping with patient age. Chronic small-vessel disease with confluent ischemic gliosis in the cerebral white matter. Symmetric unremarkable hippocampal appearance for age. Foci of susceptibility at the bilateral dentate nuclei, likely physiologic. No generalized hemorrhagic injuries. Vascular: Normal flow voids. Skull and upper cervical spine: No marrow lesion noted. Advanced cervical spine disc degeneration. Sinuses/Orbits: Bilateral mastoid opacification which had a chronic appearance by CT. Clear paranasal sinuses. Bilateral cataract resection. IMPRESSION: 1. No acute finding. 2. Small remote right frontal infarct. No other cortical finding to explain seizure. 3. Chronic small vessel disease that is confluent in the deep cerebral white matter. Electronically Signed   By: Monte Fantasia M.D.   On: 03/09/2017 12:47     Labs:   Basic Metabolic Panel:  Recent Labs Lab 03/08/17 2100 03/09/17 0537 03/10/17 0800 03/11/17 0816  NA 136 138 137 137  K 4.3 3.6 3.6 3.1*  CL 85* 86* 85* 85*  CO2 35* 37* 40* 39*  GLUCOSE 187* 193* 166* 168*  BUN 34* 33* 29* 29*  CREATININE  2.40* 2.17* 1.93* 1.91*  CALCIUM 9.2 9.0 8.7* 8.7*  MG  --   --   --  1.8   GFR Estimated Creatinine Clearance: 24.3 mL/min (A) (by C-G formula based on SCr of 1.91 mg/dL (H)). Liver Function Tests:  Recent Labs Lab 03/08/17 2100  AST 26  ALT 10*  ALKPHOS 78  BILITOT 0.8  PROT 7.9  ALBUMIN 3.8   No results for input(s): LIPASE, AMYLASE in the last 168 hours. No results for input(s): AMMONIA in the last 168 hours. Coagulation profile  Recent Labs Lab 03/08/17 2100 03/10/17 0537 03/11/17 0549  INR 2.39  2.25 2.21    CBC:  Recent Labs Lab 03/08/17 2100 03/09/17 0537 03/10/17 0800 03/11/17 0816  WBC 10.4 8.2 7.1 6.3  NEUTROABS 7.5  --   --   --   HGB 9.8* 9.2* 9.2* 9.4*  HCT 31.8* 30.4* 29.5* 31.1*  MCV 89.3 89.4 89.4 88.9  PLT 190 177 174 176   Cardiac Enzymes:  Recent Labs Lab 03/08/17 2100 03/09/17 0055 03/09/17 0537  TROPONINI 0.04* 0.03* 0.03*   BNP: Invalid input(s): POCBNP CBG:  Recent Labs Lab 03/08/17 2156  GLUCAP 181*   D-Dimer No results for input(s): DDIMER in the last 72 hours. Hgb A1c No results for input(s): HGBA1C in the last 72 hours. Lipid Profile No results for input(s): CHOL, HDL, LDLCALC, TRIG, CHOLHDL, LDLDIRECT in the last 72 hours. Thyroid function studies  Recent Labs  03/09/17 0537  TSH 2.132   Anemia work up No results for input(s): VITAMINB12, FOLATE, FERRITIN, TIBC, IRON, RETICCTPCT in the last 72 hours. Microbiology Recent Results (from the past 240 hour(s))  Urine culture     Status: Abnormal   Collection Time: 03/08/17 10:02 PM  Result Value Ref Range Status   Specimen Description URINE, RANDOM  Final   Special Requests NONE  Final   Culture >=100,000 COLONIES/mL ESCHERICHIA COLI (A)  Final   Report Status 03/11/2017 FINAL  Final   Organism ID, Bacteria ESCHERICHIA COLI (A)  Final      Susceptibility   Escherichia coli - MIC*    AMPICILLIN <=2 SENSITIVE Sensitive     CEFAZOLIN <=4 SENSITIVE Sensitive     CEFTRIAXONE <=1 SENSITIVE Sensitive     CIPROFLOXACIN <=0.25 SENSITIVE Sensitive     GENTAMICIN <=1 SENSITIVE Sensitive     IMIPENEM <=0.25 SENSITIVE Sensitive     NITROFURANTOIN <=16 SENSITIVE Sensitive     TRIMETH/SULFA >=320 RESISTANT Resistant     AMPICILLIN/SULBACTAM <=2 SENSITIVE Sensitive     PIP/TAZO <=4 SENSITIVE Sensitive     Extended ESBL NEGATIVE Sensitive     * >=100,000 COLONIES/mL ESCHERICHIA COLI     Discharge Instructions:   Discharge Instructions    Diet - low sodium heart healthy     Complete by:  As directed    Discharge instructions    Complete by:  As directed    See d/c summary   Increase activity slowly    Complete by:  As directed      Allergies as of 03/11/2017      Reactions   Peach [prunus Persica] Rash   AGENT: peach fuzz      Medication List    STOP taking these medications   metolazone 2.5 MG tablet Commonly known as:  ZAROXOLYN     TAKE these medications   acetaminophen 500 MG tablet Commonly known as:  TYLENOL Take 1,000 mg by mouth 2 (two) times daily as needed for mild pain.  atorvastatin 20 MG tablet Commonly known as:  LIPITOR TAKE 1 TABLET BY MOUTH EVERY DAY   gabapentin 300 MG capsule Commonly known as:  NEURONTIN Take 1 capsule (300 mg total) by mouth at bedtime. What changed:  when to take this   metoprolol succinate 50 MG 24 hr tablet Commonly known as:  TOPROL-XL Take 1 tablet (50 mg total) by mouth daily. Take with or immediately following a meal. Start taking on:  03/12/2017 What changed:  medication strength  how much to take  when to take this  additional instructions   OXYGEN Inhale 3 L into the lungs continuous.   pantoprazole 40 MG tablet Commonly known as:  PROTONIX Take 1 tablet (40 mg total) by mouth 2 (two) times daily before a meal. What changed:  when to take this   potassium chloride SA 20 MEQ tablet Commonly known as:  K-DUR,KLOR-CON Take 2 tablets (40 mEq total) by mouth daily. What changed:  when to take this  Another medication with the same name was removed. Continue taking this medication, and follow the directions you see here.   torsemide 20 MG tablet Commonly known as:  DEMADEX Take 2 tablets (40 mg total) by mouth daily. Take in the PM around 4 What changed:  You were already taking a medication with the same name, and this prescription was added. Make sure you understand how and when to take each.   torsemide 20 MG tablet Commonly known as:  DEMADEX Take 3 tablets (60 mg total)  by mouth every morning. Start taking on:  03/12/2017 What changed:  how much to take  how to take this  when to take this  additional instructions   warfarin 2 MG tablet Commonly known as:  COUMADIN Take 3 mg by mouth daily.      Follow-up Information    Maximino Greenland, MD Follow up in 1 week(s).   Specialty:  Internal Medicine Contact information: [address] [place] [address] [phone]        Loralie Champagne, MD Follow up in 1 week(s).   Specialty:  Cardiology Contact information: [address] [place] [address] [phone]            Time coordinating discharge: 35 min  Signed:  Div[place] Kyler Alison Stalling   [facility] 03/11/2017, 3:01 PM

## 2017-03-12 DIAGNOSIS — E785 Hyperlipidemia, unspecified: Secondary | ICD-10-CM | POA: Diagnosis present

## 2017-03-12 DIAGNOSIS — Z86718 Personal history of other venous thrombosis and embolism: Secondary | ICD-10-CM | POA: Diagnosis not present

## 2017-03-12 DIAGNOSIS — I130 Hypertensive heart and chronic kidney disease with heart failure and stage 1 through stage 4 chronic kidney disease, or unspecified chronic kidney disease: Secondary | ICD-10-CM | POA: Diagnosis not present

## 2017-03-12 DIAGNOSIS — E1142 Type 2 diabetes mellitus with diabetic polyneuropathy: Secondary | ICD-10-CM | POA: Diagnosis present

## 2017-03-12 DIAGNOSIS — R000 Tachycardia, unspecified: Secondary | ICD-10-CM | POA: Diagnosis present

## 2017-03-12 DIAGNOSIS — J9690 Respiratory failure, unspecified, unspecified whether with hypoxia or hypercapnia: Secondary | ICD-10-CM | POA: Diagnosis not present

## 2017-03-12 DIAGNOSIS — Z4682 Encounter for fitting and adjustment of non-vascular catheter: Secondary | ICD-10-CM | POA: Diagnosis not present

## 2017-03-12 DIAGNOSIS — R578 Other shock: Secondary | ICD-10-CM | POA: Diagnosis present

## 2017-03-12 DIAGNOSIS — R509 Fever, unspecified: Secondary | ICD-10-CM | POA: Diagnosis not present

## 2017-03-12 DIAGNOSIS — Z7901 Long term (current) use of anticoagulants: Secondary | ICD-10-CM | POA: Diagnosis not present

## 2017-03-12 DIAGNOSIS — I509 Heart failure, unspecified: Secondary | ICD-10-CM | POA: Diagnosis not present

## 2017-03-12 DIAGNOSIS — K921 Melena: Secondary | ICD-10-CM | POA: Diagnosis not present

## 2017-03-12 DIAGNOSIS — E876 Hypokalemia: Secondary | ICD-10-CM | POA: Diagnosis not present

## 2017-03-12 DIAGNOSIS — G4089 Other seizures: Secondary | ICD-10-CM | POA: Diagnosis not present

## 2017-03-12 DIAGNOSIS — E1165 Type 2 diabetes mellitus with hyperglycemia: Secondary | ICD-10-CM | POA: Diagnosis present

## 2017-03-12 DIAGNOSIS — I4891 Unspecified atrial fibrillation: Secondary | ICD-10-CM | POA: Diagnosis not present

## 2017-03-12 DIAGNOSIS — I248 Other forms of acute ischemic heart disease: Secondary | ICD-10-CM | POA: Diagnosis not present

## 2017-03-12 DIAGNOSIS — M6281 Muscle weakness (generalized): Secondary | ICD-10-CM | POA: Diagnosis not present

## 2017-03-12 DIAGNOSIS — R031 Nonspecific low blood-pressure reading: Secondary | ICD-10-CM | POA: Diagnosis not present

## 2017-03-12 DIAGNOSIS — D696 Thrombocytopenia, unspecified: Secondary | ICD-10-CM | POA: Diagnosis present

## 2017-03-12 DIAGNOSIS — R739 Hyperglycemia, unspecified: Secondary | ICD-10-CM | POA: Diagnosis not present

## 2017-03-12 DIAGNOSIS — E118 Type 2 diabetes mellitus with unspecified complications: Secondary | ICD-10-CM | POA: Diagnosis not present

## 2017-03-12 DIAGNOSIS — W19XXXA Unspecified fall, initial encounter: Secondary | ICD-10-CM

## 2017-03-12 DIAGNOSIS — K922 Gastrointestinal hemorrhage, unspecified: Secondary | ICD-10-CM | POA: Diagnosis not present

## 2017-03-12 DIAGNOSIS — K625 Hemorrhage of anus and rectum: Secondary | ICD-10-CM | POA: Diagnosis not present

## 2017-03-12 DIAGNOSIS — I482 Chronic atrial fibrillation: Secondary | ICD-10-CM | POA: Diagnosis not present

## 2017-03-12 DIAGNOSIS — I517 Cardiomegaly: Secondary | ICD-10-CM | POA: Diagnosis not present

## 2017-03-12 DIAGNOSIS — R42 Dizziness and giddiness: Secondary | ICD-10-CM | POA: Diagnosis not present

## 2017-03-12 DIAGNOSIS — E872 Acidosis: Secondary | ICD-10-CM | POA: Diagnosis not present

## 2017-03-12 DIAGNOSIS — I5043 Acute on chronic combined systolic (congestive) and diastolic (congestive) heart failure: Secondary | ICD-10-CM | POA: Diagnosis not present

## 2017-03-12 DIAGNOSIS — I10 Essential (primary) hypertension: Secondary | ICD-10-CM | POA: Diagnosis not present

## 2017-03-12 DIAGNOSIS — N390 Urinary tract infection, site not specified: Secondary | ICD-10-CM | POA: Diagnosis not present

## 2017-03-12 DIAGNOSIS — Z9911 Dependence on respirator [ventilator] status: Secondary | ICD-10-CM | POA: Diagnosis not present

## 2017-03-12 DIAGNOSIS — J9601 Acute respiratory failure with hypoxia: Secondary | ICD-10-CM | POA: Diagnosis not present

## 2017-03-12 DIAGNOSIS — I272 Pulmonary hypertension, unspecified: Secondary | ICD-10-CM | POA: Diagnosis present

## 2017-03-12 DIAGNOSIS — E1122 Type 2 diabetes mellitus with diabetic chronic kidney disease: Secondary | ICD-10-CM | POA: Diagnosis present

## 2017-03-12 DIAGNOSIS — N170 Acute kidney failure with tubular necrosis: Secondary | ICD-10-CM | POA: Diagnosis not present

## 2017-03-12 DIAGNOSIS — G9340 Encephalopathy, unspecified: Secondary | ICD-10-CM | POA: Diagnosis not present

## 2017-03-12 DIAGNOSIS — R571 Hypovolemic shock: Secondary | ICD-10-CM | POA: Diagnosis not present

## 2017-03-12 DIAGNOSIS — R1030 Lower abdominal pain, unspecified: Secondary | ICD-10-CM | POA: Diagnosis not present

## 2017-03-12 DIAGNOSIS — N184 Chronic kidney disease, stage 4 (severe): Secondary | ICD-10-CM | POA: Diagnosis not present

## 2017-03-12 DIAGNOSIS — D62 Acute posthemorrhagic anemia: Secondary | ICD-10-CM | POA: Diagnosis not present

## 2017-03-12 DIAGNOSIS — Z01818 Encounter for other preprocedural examination: Secondary | ICD-10-CM | POA: Diagnosis not present

## 2017-03-12 DIAGNOSIS — G4733 Obstructive sleep apnea (adult) (pediatric): Secondary | ICD-10-CM | POA: Diagnosis present

## 2017-03-12 DIAGNOSIS — Z853 Personal history of malignant neoplasm of breast: Secondary | ICD-10-CM | POA: Diagnosis not present

## 2017-03-12 DIAGNOSIS — K219 Gastro-esophageal reflux disease without esophagitis: Secondary | ICD-10-CM | POA: Diagnosis present

## 2017-03-12 DIAGNOSIS — I5032 Chronic diastolic (congestive) heart failure: Secondary | ICD-10-CM | POA: Diagnosis not present

## 2017-03-12 DIAGNOSIS — J9621 Acute and chronic respiratory failure with hypoxia: Secondary | ICD-10-CM | POA: Diagnosis not present

## 2017-03-12 DIAGNOSIS — K264 Chronic or unspecified duodenal ulcer with hemorrhage: Secondary | ICD-10-CM | POA: Diagnosis not present

## 2017-03-12 DIAGNOSIS — S22059S Unspecified fracture of T5-T6 vertebra, sequela: Secondary | ICD-10-CM | POA: Diagnosis not present

## 2017-03-12 DIAGNOSIS — R55 Syncope and collapse: Secondary | ICD-10-CM | POA: Diagnosis not present

## 2017-03-12 DIAGNOSIS — Z6841 Body Mass Index (BMI) 40.0 and over, adult: Secondary | ICD-10-CM | POA: Diagnosis not present

## 2017-03-12 LAB — PROTIME-INR
INR: 1.94
PROTHROMBIN TIME: 22.5 s — AB (ref 11.4–15.2)

## 2017-03-12 NOTE — NC FL2 (Signed)
[place] LEVEL OF CARE SCREENING TOOL     IDENTIFICATION  Patient Name: [name] Birthdate: 31-Dec-1925 Sex: female Admission Date (Current Location): 03/08/2017  [facility] and [place] Number:  Herbalist and Address:  [facility] [place] [place] [address] [place], [place] [address]      Provider Number: [id number]  Attending Physician Name and Address:  Mariel Aloe, MD  Relative Name and Phone Number:       Current Level of Care: Hospital Recommended Level of Care: [place] Prior Approval Number:    Date Approved/Denied:   PASRR Number:    Discharge Plan: SNF    Current Diagnoses: Patient Active Problem List   Diagnosis Date Noted  . Falls 03/09/2017  . Elevated troponin 03/09/2017  . Seizure ([place])   . Pressure injury of skin 08/26/2016  . Syncope 08/24/2016  . CKD (chronic kidney disease) stage 4, GFR 15-29 ml/min (HCC) 08/24/2016  . Thoracic compression fracture ([place]) 08/24/2016  . Diabetes mellitus type 2, uncontrolled ([place]) 08/24/2016  . Uncontrolled type 2 diabetes mellitus with complication ([place])   . Chronic diastolic congestive heart failure ([place]) 02/28/2016  . Lower urinary tract infectious disease   . Palliative care encounter   . Tricuspid valve regurgitation   . Pulmonary hypertensive venous disease 08/28/2015  . Normal coronary arteries-June 2016 08/12/2015  . Severe obesity (BMI >= 40) ([place]) 06/09/2015  . Anterior dislocation of left shoulder 05/19/2015  . Pulmonary hypertension, moderate to severe ([place]) 05/19/2015  . Choledocholithiasis 10/14/2012  . 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 Duodenal ulcer with hemorrhage 09/03/2012  . Calculus of bile duct without mention of cholecystitis or obstruction 09/02/2012  . Calculus of gallbladder without mention of cholecystitis or obstruction 09/02/2012  . Chronic anticoagulation-Warfarin 08/30/2012  . DCIS (ductal carcinoma in situ) of  breast 07/15/2010  . HYPERCHOLESTEROLEMIA 02/08/2010  . Hypertensive cardiovascular disease 02/08/2010  . Chronic atrial fibrillation- CHADs VASc =6 02/08/2010  . Mitral valve regurgitation with secondary [facility] 02/08/2010    Orientation RESPIRATION BLADDER Height & Weight     Self, Time, Situation, Place  O2 (Nasal cannula 2L) Continent Weight: 106.3 kg (234 lb 5.6 oz) Height:  5\' 4"  (162.6 cm)  BEHAVIORAL SYMPTOMS/MOOD NEUROLOGICAL BOWEL NUTRITION STATUS      Continent Diet (Please see DC Summary)  AMBULATORY STATUS COMMUNICATION OF NEEDS Skin   Extensive Assist Verbally Normal                       Personal Care Assistance Level of Assistance  Bathing, Feeding, Dressing Bathing Assistance: Maximum assistance Feeding assistance: Limited assistance Dressing Assistance: Limited assistance     Functional Limitations Info  Sight Sight Info: Impaired        SPECIAL CARE FACTORS FREQUENCY  PT (By licensed PT)     PT Frequency: 5x/week              Contractures Contractures Info: Not present    Additional Factors Info  Code Status, Allergies Code Status Info: Full Allergies Info: Peach (Prunus Persica)           Current Medications (03/12/2017):  This is the current hospital active medication list Current Facility-Administered Medications  Medication Dose Route Frequency Provider Last Rate Last Dose  . acetaminophen (TYLENOL) tablet 650 mg  650 mg Oral Q6H PRN Norval Morton, MD       Or  . acetaminophen (TYLENOL) suppository 650 mg  650 mg Rectal Q6H PRN Norval Morton, MD      . albuterol (PROVENTIL) (2.5 MG/3ML) 0.083% nebulizer solution 2.5 mg  2.5 mg Nebulization Q2H PRN Rondell A Tamala Julian, MD      . atorvastatin (LIPITOR) tablet 20 mg  20 mg Oral Daily Norval Morton, MD   20 mg at 03/11/17 0947  . cefTRIAXone (ROCEPHIN) 1 g in dextrose 5 % 50 mL IVPB  1 g Intravenous Q24H Norval Morton, MD 100 mL/hr at 03/12/17 0324 1 g at 03/12/17 0324  . gabapentin  (NEURONTIN) capsule 300 mg  300 mg Oral Daily Norval Morton, MD   300 mg at 03/11/17 0946  . metoprolol succinate (TOPROL-XL) 24 hr tablet 50 mg  50 mg Oral Daily Tami Lin [place], [place]   50 mg at 03/11/17 0947  . pantoprazole (PROTONIX) EC tablet 40 mg  40 mg Oral BID Norval Morton, MD   40 mg at 03/11/17 2333  . potassium chloride SA (K-DUR,KLOR-CON) CR tablet 40 mEq  40 mEq Oral BID Tami Lin Duke, PA   40 mEq at 03/11/17 2334  . sodium chloride flush (NS) 0.9 % injection 3 mL  3 mL Intravenous Q12H Norval Morton, MD   3 mL at 03/11/17 2334  . torsemide (DEMADEX) tablet 40 mg  40 mg Oral Daily Tami Lin Duke, [place]   40 mg at 03/11/17 1808  . torsemide (DEMADEX) tablet 60 mg  60 mg Oral q morning - 10a Tami Lin Duke, PA   60 mg at 03/11/17 0945  . warfarin (COUMADIN) tablet 3 mg  3 mg Oral q1800 Norval Morton, MD   3 mg at 03/11/17 1808  . Warfarin - Pharmacist Dosing Inpatient   Does not apply q1800 Norval Morton, MD         Discharge Medications: Please see discharge summary for a list of discharge medications.  Relevant Imaging Results:  Relevant Lab Results:   Additional Information SSN:[id number]  Benard Halsted, LCSWA

## 2017-03-12 NOTE — Progress Notes (Signed)
Nsg Discharge Note  Admit Date:  03/08/2017 Discharge date: 03/12/2017   Rachell Cipro to be D/C'd Skilled nursing facility per MD order.  AVS completed.  Copy for chart, and copy for patient signed, and dated. Patient/caregiver able to verbalize understanding.  Discharge Medication: Allergies as of 03/12/2017      Reactions   Peach [prunus Persica] Rash   AGENT: peach fuzz      Medication List    STOP taking these medications   metolazone 2.5 MG tablet Commonly known as:  ZAROXOLYN     TAKE these medications   acetaminophen 500 MG tablet Commonly known as:  TYLENOL Take 1,000 mg by mouth 2 (two) times daily as needed for mild pain.   atorvastatin 20 MG tablet Commonly known as:  LIPITOR TAKE 1 TABLET BY MOUTH EVERY DAY   gabapentin 300 MG capsule Commonly known as:  NEURONTIN Take 1 capsule (300 mg total) by mouth at bedtime. What changed:  when to take this   metoprolol succinate 50 MG 24 hr tablet Commonly known as:  TOPROL-XL Take 1 tablet (50 mg total) by mouth daily. Take with or immediately following a meal. What changed:  medication strength  how much to take  when to take this  additional instructions   OXYGEN Inhale 3 L into the lungs continuous.   pantoprazole 40 MG tablet Commonly known as:  PROTONIX Take 1 tablet (40 mg total) by mouth 2 (two) times daily before a meal. What changed:  when to take this   potassium chloride SA 20 MEQ tablet Commonly known as:  K-DUR,KLOR-CON Take 2 tablets (40 mEq total) by mouth daily. What changed:  when to take this  Another medication with the same name was removed. Continue taking this medication, and follow the directions you see here.   torsemide 20 MG tablet Commonly known as:  DEMADEX Take 2 tablets (40 mg total) by mouth daily. Take in the PM around 4 What changed:  You were already taking a medication with the same name, and this prescription was added. Make sure you understand how and when to  take each.   torsemide 20 MG tablet Commonly known as:  DEMADEX Take 3 tablets (60 mg total) by mouth every morning. What changed:  how much to take  how to take this  when to take this  additional instructions   warfarin 2 MG tablet Commonly known as:  COUMADIN Take 3 mg by mouth daily.       Discharge Assessment: Vitals:   03/12/17 0908 03/12/17 1618  BP: (!) 117/56 (!) 108/58  Pulse: 76 93  Resp: 18 18  Temp:  98.6 F (37 C)   Skin clean, dry and intact without evidence of skin break down, no evidence of skin tears noted. IV catheter discontinued intact. Site without signs and symptoms of complications - no redness or edema noted at insertion site, patient denies c/o pain - only slight tenderness at site.  Dressing with slight pressure applied.  D/c Instructions-Education: Discharge instructions given to PTAR.  Danique Hartsough, Thornell Mule, RN 03/12/2017 6:46 PM

## 2017-03-12 NOTE — Progress Notes (Signed)
Patient will DC to: [facility] Anticipated DC date: 03/12/17 Family notified: Daughter, Control and instrumentation engineer by: Corey Harold 4:30pm   Per MD patient ready for DC to [facility]. RN, patient, patient's family, and facility notified of DC. Discharge Summary sent to facility. RN given number for report. DC packet on chart. Ambulance transport requested for patient.   CSW signing off.  Cedric Fishman, [place] Social Worker [phone]

## 2017-03-12 NOTE — Clinical Social Work Note (Signed)
Clinical Social Work Assessment  Patient Details  Name: [name] MRN: [id number] Date of Birth: 1926-11-18  Date of referral:  03/12/17               Reason for consult:  Facility Placement                Permission sought to share information with:  Facility Sport and exercise psychologist, Family Supports Permission granted to share information::  Yes, Verbal Permission Granted  Name::     International aid/development worker::  [place]  Relationship::     Contact Information:     Housing/Transportation Living arrangements for the past 2 months:  [place] of Information:  Other (Comment Required) (Daughter) Patient Interpreter Needed:  None Criminal Activity/Legal Involvement Pertinent to Current Situation/Hospitalization:  No - Comment as needed Significant Relationships:  Adult Children Lives with:  Self, Facility Resident Do you feel safe going back to the place where you live?  Yes Need for family participation in patient care:  Yes (Comment)  Care giving concerns:  CSW received consult for discharge planning. Patient is from [facility] and will return at discharge. CSW also spoke with patient's daughter, [name]. CSW to continue to follow and assist with discharge planning needs.   Social Worker assessment / plan:  CSW spoke with patient and sister regarding discharge back to SNF.   Employment status:  Retired Nurse, adult PT Recommendations:  [place] / Referral to community resources:  [place]  Patient/Family's Response to care:  Patient recognizes need for continued rehab before returning home and is agreeable to returning to [facility]. CSW explained insurance barrier.  Patient/Family's Understanding of and Emotional Response to Diagnosis, Current Treatment, and Prognosis:  Patient/family is realistic regarding therapy needs and expressed being hopeful for SNF placement. Patient understands  her condition and is eager to get back to rehab. Patient expressed understanding of CSW role and discharge process. No questions/concerns about plan or treatment.    Emotional Assessment Appearance:  Appears stated age Attitude/Demeanor/Rapport:  Unable to Assess Affect (typically observed):  Other (Appropriate) Orientation:  Oriented to Self, Oriented to Situation, Oriented to Place, Oriented to  Time Alcohol / Substance use:  Not Applicable Psych involvement (Current and /or in the community):  No (Comment)  Discharge Needs  Concerns to be addressed:  No discharge needs identified Readmission within the last 30 days:  No Current discharge risk:  None Barriers to Discharge:  No Barriers Identified   Benard Halsted, [place] 03/12/2017, 1:52 PM

## 2017-03-12 NOTE — Progress Notes (Signed)
qPhysical Therapy Treatment Patient Details Name: [name] MRN: [id number] DOB: 13-Nov-1926 Today's Date: 03/12/2017    History of Present Illness [name] is an 83 y.o. female who presented after having 3 falls on Sunday, in conjunction with generalized weakness. Family also reported absence-like spells. The patient has no prior history of seizures. PMH:  CHF, DM, HTN, neuropathy, syncope, UTI, cholecystitis, UTI, chronic afib.    PT Comments    Pt limited by fatigue this session, declining out of bed therapy but agreeable to EOB therex with focus on maintaining dynamic sitting balance.  Pt completed exercises listed below and attempted forward reach with LUE support but pt fearful of falling and unable to shift weight forward.  Returned to bed at end of session, +2 to scoot to [facility] with draw sheet, bed positioned in chair position for BP management, call bell in reach and needs met.      Follow Up Recommendations  SNF;Supervision/Assistance - 24 hour     Equipment Recommendations  None recommended by PT    Recommendations for Other Services       Precautions / Restrictions Precautions Precautions: Fall Restrictions Weight Bearing Restrictions: No    Mobility  Bed Mobility Overal bed mobility: Needs Assistance Bed Mobility: Supine to Sit;Sit to Supine     Supine to sit: Mod assist Sit to supine: Mod assist   General bed mobility comments: requires assist to elevate trunk and to scoot to EOB for balance; requires assist for BLEs into bed  Transfers Overall transfer level:  (declined)                  Ambulation/Gait                 Stairs            Wheelchair Mobility    Modified Rankin (Stroke Patients Only)       Balance                                            Cognition                                              Exercises General Exercises - Lower Extremity Ankle  Circles/Pumps: AROM;Both;10 reps;Seated Long Arc Quad: AROM;Both;10 reps;Seated Hip ABduction/ADduction: AROM;Both;10 reps;Seated Hip Flexion/Marching: AROM;Both;10 reps;Seated    General Comments        Pertinent Vitals/Pain Pain Assessment: No/denies pain    Home Living                      Prior Function            PT Goals (current goals can now be found in the care plan section) Acute Rehab PT Goals PT Goal Formulation: With patient Time For Goal Achievement: 04-05-2017 Potential to Achieve Goals: Good Progress towards PT goals: Progressing toward goals    Frequency    Min 2X/week      PT Plan Current plan remains appropriate    Co-evaluation             End of Session Equipment Utilized During Treatment: Oxygen Activity Tolerance: Patient limited by fatigue Patient left: in bed;with call bell/phone within reach;with bed alarm set  Nurse Communication: Mobility status PT Visit Diagnosis: Unsteadiness on feet (R26.81);Muscle weakness (generalized) (M62.81)     Time: 9311-2162 PT Time Calculation (min) (ACUTE ONLY): 11 min  Charges:  $Therapeutic Activity: 8-22 mins                    G Codes:       [name], PT, DPT    [name] E Penven-Crew 03/12/2017, 12:12 PM

## 2017-03-12 NOTE — Progress Notes (Signed)
Patient seen and examined. No changes from previous discharge summary note. Still stable for discharge to SNF today.  Cordelia Poche, MD [facility] 03/12/2017, 12:45 PM Pager: [phone]

## 2017-03-18 ENCOUNTER — Telehealth: Payer: Self-pay

## 2017-03-18 ENCOUNTER — Ambulatory Visit: Payer: Medicare HMO | Admitting: Neurology

## 2017-03-18 ENCOUNTER — Encounter: Payer: Self-pay | Admitting: Neurology

## 2017-03-18 NOTE — Telephone Encounter (Signed)
Pt called and cancelled same day new patient appt.

## 2017-03-19 ENCOUNTER — Inpatient Hospital Stay (HOSPITAL_COMMUNITY): Payer: Medicare HMO

## 2017-03-19 ENCOUNTER — Inpatient Hospital Stay (HOSPITAL_COMMUNITY)
Admission: EM | Admit: 2017-03-19 | Discharge: 2017-04-07 | DRG: 377 | Disposition: E | Payer: Medicare HMO | Attending: Internal Medicine | Admitting: Internal Medicine

## 2017-03-19 ENCOUNTER — Encounter (HOSPITAL_COMMUNITY): Payer: Self-pay

## 2017-03-19 ENCOUNTER — Encounter (HOSPITAL_COMMUNITY): Admission: EM | Disposition: E | Payer: Self-pay | Source: Home / Self Care | Attending: Pulmonary Disease

## 2017-03-19 ENCOUNTER — Other Ambulatory Visit (HOSPITAL_COMMUNITY): Payer: Self-pay | Admitting: Cardiology

## 2017-03-19 DIAGNOSIS — N170 Acute kidney failure with tubular necrosis: Secondary | ICD-10-CM | POA: Diagnosis not present

## 2017-03-19 DIAGNOSIS — D62 Acute posthemorrhagic anemia: Secondary | ICD-10-CM | POA: Diagnosis not present

## 2017-03-19 DIAGNOSIS — G9340 Encephalopathy, unspecified: Secondary | ICD-10-CM

## 2017-03-19 DIAGNOSIS — R0989 Other specified symptoms and signs involving the circulatory and respiratory systems: Secondary | ICD-10-CM | POA: Diagnosis not present

## 2017-03-19 DIAGNOSIS — Z6841 Body Mass Index (BMI) 40.0 and over, adult: Secondary | ICD-10-CM

## 2017-03-19 DIAGNOSIS — E1142 Type 2 diabetes mellitus with diabetic polyneuropathy: Secondary | ICD-10-CM | POA: Diagnosis present

## 2017-03-19 DIAGNOSIS — J9811 Atelectasis: Secondary | ICD-10-CM | POA: Diagnosis not present

## 2017-03-19 DIAGNOSIS — E1122 Type 2 diabetes mellitus with diabetic chronic kidney disease: Secondary | ICD-10-CM | POA: Diagnosis present

## 2017-03-19 DIAGNOSIS — R739 Hyperglycemia, unspecified: Secondary | ICD-10-CM | POA: Diagnosis not present

## 2017-03-19 DIAGNOSIS — I509 Heart failure, unspecified: Secondary | ICD-10-CM | POA: Diagnosis present

## 2017-03-19 DIAGNOSIS — Z01818 Encounter for other preprocedural examination: Secondary | ICD-10-CM | POA: Diagnosis not present

## 2017-03-19 DIAGNOSIS — J9690 Respiratory failure, unspecified, unspecified whether with hypoxia or hypercapnia: Secondary | ICD-10-CM

## 2017-03-19 DIAGNOSIS — Z4682 Encounter for fitting and adjustment of non-vascular catheter: Secondary | ICD-10-CM | POA: Diagnosis not present

## 2017-03-19 DIAGNOSIS — K921 Melena: Secondary | ICD-10-CM | POA: Diagnosis not present

## 2017-03-19 DIAGNOSIS — I248 Other forms of acute ischemic heart disease: Secondary | ICD-10-CM | POA: Diagnosis present

## 2017-03-19 DIAGNOSIS — Z91018 Allergy to other foods: Secondary | ICD-10-CM

## 2017-03-19 DIAGNOSIS — Z853 Personal history of malignant neoplasm of breast: Secondary | ICD-10-CM | POA: Diagnosis not present

## 2017-03-19 DIAGNOSIS — R571 Hypovolemic shock: Secondary | ICD-10-CM | POA: Diagnosis present

## 2017-03-19 DIAGNOSIS — Z86718 Personal history of other venous thrombosis and embolism: Secondary | ICD-10-CM

## 2017-03-19 DIAGNOSIS — Z9981 Dependence on supplemental oxygen: Secondary | ICD-10-CM

## 2017-03-19 DIAGNOSIS — I272 Pulmonary hypertension, unspecified: Secondary | ICD-10-CM | POA: Diagnosis present

## 2017-03-19 DIAGNOSIS — N184 Chronic kidney disease, stage 4 (severe): Secondary | ICD-10-CM | POA: Diagnosis present

## 2017-03-19 DIAGNOSIS — Z8249 Family history of ischemic heart disease and other diseases of the circulatory system: Secondary | ICD-10-CM

## 2017-03-19 DIAGNOSIS — Z9911 Dependence on respirator [ventilator] status: Secondary | ICD-10-CM | POA: Diagnosis not present

## 2017-03-19 DIAGNOSIS — D509 Iron deficiency anemia, unspecified: Secondary | ICD-10-CM | POA: Diagnosis not present

## 2017-03-19 DIAGNOSIS — E1165 Type 2 diabetes mellitus with hyperglycemia: Secondary | ICD-10-CM | POA: Diagnosis present

## 2017-03-19 DIAGNOSIS — G4733 Obstructive sleep apnea (adult) (pediatric): Secondary | ICD-10-CM | POA: Diagnosis present

## 2017-03-19 DIAGNOSIS — I517 Cardiomegaly: Secondary | ICD-10-CM | POA: Diagnosis not present

## 2017-03-19 DIAGNOSIS — K219 Gastro-esophageal reflux disease without esophagitis: Secondary | ICD-10-CM | POA: Diagnosis present

## 2017-03-19 DIAGNOSIS — J9691 Respiratory failure, unspecified with hypoxia: Secondary | ICD-10-CM | POA: Diagnosis not present

## 2017-03-19 DIAGNOSIS — I4891 Unspecified atrial fibrillation: Secondary | ICD-10-CM | POA: Diagnosis present

## 2017-03-19 DIAGNOSIS — J9621 Acute and chronic respiratory failure with hypoxia: Secondary | ICD-10-CM | POA: Diagnosis not present

## 2017-03-19 DIAGNOSIS — R578 Other shock: Secondary | ICD-10-CM | POA: Diagnosis present

## 2017-03-19 DIAGNOSIS — K922 Gastrointestinal hemorrhage, unspecified: Secondary | ICD-10-CM | POA: Diagnosis not present

## 2017-03-19 DIAGNOSIS — R509 Fever, unspecified: Secondary | ICD-10-CM | POA: Diagnosis not present

## 2017-03-19 DIAGNOSIS — R031 Nonspecific low blood-pressure reading: Secondary | ICD-10-CM | POA: Diagnosis not present

## 2017-03-19 DIAGNOSIS — Z66 Do not resuscitate: Secondary | ICD-10-CM

## 2017-03-19 DIAGNOSIS — I482 Chronic atrial fibrillation: Secondary | ICD-10-CM | POA: Diagnosis not present

## 2017-03-19 DIAGNOSIS — R918 Other nonspecific abnormal finding of lung field: Secondary | ICD-10-CM | POA: Diagnosis not present

## 2017-03-19 DIAGNOSIS — D696 Thrombocytopenia, unspecified: Secondary | ICD-10-CM | POA: Diagnosis present

## 2017-03-19 DIAGNOSIS — Z515 Encounter for palliative care: Secondary | ICD-10-CM | POA: Diagnosis present

## 2017-03-19 DIAGNOSIS — Z79899 Other long term (current) drug therapy: Secondary | ICD-10-CM

## 2017-03-19 DIAGNOSIS — R1030 Lower abdominal pain, unspecified: Secondary | ICD-10-CM | POA: Diagnosis not present

## 2017-03-19 DIAGNOSIS — J9601 Acute respiratory failure with hypoxia: Secondary | ICD-10-CM | POA: Diagnosis not present

## 2017-03-19 DIAGNOSIS — Z7901 Long term (current) use of anticoagulants: Secondary | ICD-10-CM | POA: Diagnosis not present

## 2017-03-19 DIAGNOSIS — J811 Chronic pulmonary edema: Secondary | ICD-10-CM | POA: Diagnosis not present

## 2017-03-19 DIAGNOSIS — Z9842 Cataract extraction status, left eye: Secondary | ICD-10-CM

## 2017-03-19 DIAGNOSIS — R42 Dizziness and giddiness: Secondary | ICD-10-CM | POA: Diagnosis not present

## 2017-03-19 DIAGNOSIS — R296 Repeated falls: Secondary | ICD-10-CM | POA: Diagnosis present

## 2017-03-19 DIAGNOSIS — N390 Urinary tract infection, site not specified: Secondary | ICD-10-CM | POA: Diagnosis not present

## 2017-03-19 DIAGNOSIS — D500 Iron deficiency anemia secondary to blood loss (chronic): Secondary | ICD-10-CM | POA: Diagnosis present

## 2017-03-19 DIAGNOSIS — E876 Hypokalemia: Secondary | ICD-10-CM | POA: Diagnosis not present

## 2017-03-19 DIAGNOSIS — K319 Disease of stomach and duodenum, unspecified: Secondary | ICD-10-CM | POA: Diagnosis present

## 2017-03-19 DIAGNOSIS — E872 Acidosis: Secondary | ICD-10-CM | POA: Diagnosis present

## 2017-03-19 DIAGNOSIS — R933 Abnormal findings on diagnostic imaging of other parts of digestive tract: Secondary | ICD-10-CM | POA: Diagnosis not present

## 2017-03-19 DIAGNOSIS — I130 Hypertensive heart and chronic kidney disease with heart failure and stage 1 through stage 4 chronic kidney disease, or unspecified chronic kidney disease: Secondary | ICD-10-CM | POA: Diagnosis present

## 2017-03-19 DIAGNOSIS — R000 Tachycardia, unspecified: Secondary | ICD-10-CM | POA: Diagnosis present

## 2017-03-19 DIAGNOSIS — K625 Hemorrhage of anus and rectum: Secondary | ICD-10-CM | POA: Diagnosis not present

## 2017-03-19 DIAGNOSIS — R34 Anuria and oliguria: Secondary | ICD-10-CM | POA: Diagnosis present

## 2017-03-19 DIAGNOSIS — E785 Hyperlipidemia, unspecified: Secondary | ICD-10-CM | POA: Diagnosis present

## 2017-03-19 HISTORY — PX: ESOPHAGOGASTRODUODENOSCOPY: SHX5428

## 2017-03-19 LAB — BASIC METABOLIC PANEL
Anion gap: 13 (ref 5–15)
BUN: 45 mg/dL — AB (ref 6–20)
CO2: 24 mmol/L (ref 22–32)
CREATININE: 2.36 mg/dL — AB (ref 0.44–1.00)
Calcium: 7.7 mg/dL — ABNORMAL LOW (ref 8.9–10.3)
Chloride: 98 mmol/L — ABNORMAL LOW (ref 101–111)
GFR calc Af Amer: 20 mL/min — ABNORMAL LOW (ref >60)
GFR, EST NON AFRICAN AMERICAN: 17 mL/min — AB (ref >60)
Glucose, Bld: 249 mg/dL — ABNORMAL HIGH (ref 65–99)
POTASSIUM: 5 mmol/L (ref 3.5–5.1)
SODIUM: 135 mmol/L (ref 135–145)

## 2017-03-19 LAB — BPAM FFP
Blood Product Expiration Date: 201804162359
Blood Product Expiration Date: 201804162359
ISSUE DATE / TIME: 201804121331
ISSUE DATE / TIME: 201804121331
UNIT TYPE AND RH: 6200
Unit Type and Rh: 6200

## 2017-03-19 LAB — CBC
HCT: 29 % — ABNORMAL LOW (ref 36.0–46.0)
Hemoglobin: 9.3 g/dL — ABNORMAL LOW (ref 12.0–15.0)
MCH: 28.7 pg (ref 26.0–34.0)
MCHC: 32.1 g/dL (ref 30.0–36.0)
MCV: 89.5 fL (ref 78.0–100.0)
PLATELETS: 141 10*3/uL — AB (ref 150–400)
RBC: 3.24 MIL/uL — AB (ref 3.87–5.11)
RDW: 15.9 % — ABNORMAL HIGH (ref 11.5–15.5)
WBC: 12.6 10*3/uL — ABNORMAL HIGH (ref 4.0–10.5)

## 2017-03-19 LAB — PROTIME-INR
INR: 1.16
INR: 1.29
PROTHROMBIN TIME: 14.9 s (ref 11.4–15.2)
PROTHROMBIN TIME: 16.2 s — AB (ref 11.4–15.2)

## 2017-03-19 LAB — PREPARE FRESH FROZEN PLASMA
UNIT DIVISION: 0
Unit division: 0

## 2017-03-19 LAB — I-STAT ARTERIAL BLOOD GAS, ED
Acid-Base Excess: 3 mmol/L — ABNORMAL HIGH (ref 0.0–2.0)
Bicarbonate: 27.8 mmol/L (ref 20.0–28.0)
O2 Saturation: 100 %
PCO2 ART: 43.9 mmHg (ref 32.0–48.0)
PH ART: 7.409 (ref 7.350–7.450)
TCO2: 29 mmol/L (ref 0–100)
pO2, Arterial: 551 mmHg — ABNORMAL HIGH (ref 83.0–108.0)

## 2017-03-19 LAB — TROPONIN I: TROPONIN I: 0.03 ng/mL — AB (ref <0.03)

## 2017-03-19 LAB — PREPARE RBC (CROSSMATCH)

## 2017-03-19 LAB — HEMOGLOBIN AND HEMATOCRIT, BLOOD
HCT: 32.6 % — ABNORMAL LOW (ref 36.0–46.0)
Hemoglobin: 10.3 g/dL — ABNORMAL LOW (ref 12.0–15.0)

## 2017-03-19 LAB — LACTIC ACID, PLASMA: Lactic Acid, Venous: 1.4 mmol/L (ref 0.5–1.9)

## 2017-03-19 LAB — I-STAT CG4 LACTIC ACID, ED: LACTIC ACID, VENOUS: 3.12 mmol/L — AB (ref 0.5–1.9)

## 2017-03-19 LAB — I-STAT CHEM 8, ED
BUN: 46 mg/dL — ABNORMAL HIGH (ref 6–20)
Calcium, Ion: 0.9 mmol/L — ABNORMAL LOW (ref 1.15–1.40)
Chloride: 95 mmol/L — ABNORMAL LOW (ref 101–111)
Creatinine, Ser: 2.5 mg/dL — ABNORMAL HIGH (ref 0.44–1.00)
Glucose, Bld: 253 mg/dL — ABNORMAL HIGH (ref 65–99)
HEMATOCRIT: 28 % — AB (ref 36.0–46.0)
HEMOGLOBIN: 9.5 g/dL — AB (ref 12.0–15.0)
POTASSIUM: 4.8 mmol/L (ref 3.5–5.1)
Sodium: 134 mmol/L — ABNORMAL LOW (ref 135–145)
TCO2: 30 mmol/L (ref 0–100)

## 2017-03-19 LAB — GLUCOSE, CAPILLARY
GLUCOSE-CAPILLARY: 183 mg/dL — AB (ref 65–99)
GLUCOSE-CAPILLARY: 203 mg/dL — AB (ref 65–99)

## 2017-03-19 LAB — I-STAT TROPONIN, ED: TROPONIN I, POC: 0.03 ng/mL (ref 0.00–0.08)

## 2017-03-19 LAB — APTT: aPTT: 29 seconds (ref 24–36)

## 2017-03-19 LAB — CBG MONITORING, ED: GLUCOSE-CAPILLARY: 192 mg/dL — AB (ref 65–99)

## 2017-03-19 SURGERY — EGD (ESOPHAGOGASTRODUODENOSCOPY)
Anesthesia: Moderate Sedation

## 2017-03-19 MED ORDER — SODIUM CHLORIDE 0.9 % IV SOLN
INTRAVENOUS | Status: DC
  Administered 2017-03-19 – 2017-03-20 (×5): via INTRAVENOUS

## 2017-03-19 MED ORDER — MIDAZOLAM HCL 2 MG/2ML IJ SOLN
INTRAMUSCULAR | Status: AC
  Filled 2017-03-19: qty 4

## 2017-03-19 MED ORDER — MIDAZOLAM HCL 2 MG/2ML IJ SOLN
INTRAMUSCULAR | Status: AC
  Filled 2017-03-19: qty 2

## 2017-03-19 MED ORDER — SODIUM CHLORIDE 0.9 % IV SOLN
8.0000 mg/h | INTRAVENOUS | Status: AC
  Administered 2017-03-19 – 2017-03-22 (×6): 8 mg/h via INTRAVENOUS
  Filled 2017-03-19 (×15): qty 80

## 2017-03-19 MED ORDER — ORAL CARE MOUTH RINSE
15.0000 mL | Freq: Four times a day (QID) | OROMUCOSAL | Status: DC
  Administered 2017-03-19 – 2017-03-20 (×3): 15 mL via OROMUCOSAL

## 2017-03-19 MED ORDER — MIDAZOLAM HCL 2 MG/2ML IJ SOLN
1.0000 mg | INTRAMUSCULAR | Status: DC | PRN
  Administered 2017-03-19 – 2017-03-20 (×2): 1 mg via INTRAVENOUS
  Filled 2017-03-19 (×2): qty 2

## 2017-03-19 MED ORDER — SODIUM CHLORIDE 0.9 % IV SOLN
80.0000 mg | Freq: Once | INTRAVENOUS | Status: AC
  Administered 2017-03-19: 80 mg via INTRAVENOUS
  Filled 2017-03-19: qty 80

## 2017-03-19 MED ORDER — PANTOPRAZOLE SODIUM 40 MG IV SOLR
40.0000 mg | Freq: Two times a day (BID) | INTRAVENOUS | Status: DC
  Administered 2017-03-23 (×2): 40 mg via INTRAVENOUS
  Filled 2017-03-19 (×3): qty 40

## 2017-03-19 MED ORDER — NOREPINEPHRINE BITARTRATE 1 MG/ML IV SOLN
0.0000 ug/min | INTRAVENOUS | Status: DC
  Administered 2017-03-19: 3 ug/min via INTRAVENOUS
  Administered 2017-03-20: 10 ug/min via INTRAVENOUS
  Filled 2017-03-19 (×2): qty 4

## 2017-03-19 MED ORDER — FENTANYL CITRATE (PF) 100 MCG/2ML IJ SOLN
INTRAMUSCULAR | Status: AC
  Filled 2017-03-19: qty 2

## 2017-03-19 MED ORDER — CHLORHEXIDINE GLUCONATE 0.12% ORAL RINSE (MEDLINE KIT)
15.0000 mL | Freq: Two times a day (BID) | OROMUCOSAL | Status: DC
  Administered 2017-03-19 – 2017-03-20 (×2): 15 mL via OROMUCOSAL

## 2017-03-19 MED ORDER — FENTANYL CITRATE (PF) 100 MCG/2ML IJ SOLN
INTRAMUSCULAR | Status: DC | PRN
  Administered 2017-03-19: 25 ug via INTRAVENOUS

## 2017-03-19 MED ORDER — LIDOCAINE HCL (PF) 2 % IJ SOLN
INTRAMUSCULAR | Status: AC
  Filled 2017-03-19: qty 2

## 2017-03-19 MED ORDER — FENTANYL CITRATE (PF) 100 MCG/2ML IJ SOLN: 50.0000 ug | INTRAMUSCULAR | Status: DC | PRN

## 2017-03-19 MED ORDER — EPINEPHRINE PF 1 MG/10ML IJ SOSY
PREFILLED_SYRINGE | INTRAMUSCULAR | Status: AC
  Filled 2017-03-19: qty 10

## 2017-03-19 MED ORDER — VITAMIN K1 10 MG/ML IJ SOLN
10.0000 mg | INTRAVENOUS | Status: AC
  Administered 2017-03-19: 10 mg via INTRAVENOUS
  Filled 2017-03-19: qty 1

## 2017-03-19 MED ORDER — SODIUM CHLORIDE 0.9 % IV SOLN
10.0000 mL/h | Freq: Once | INTRAVENOUS | Status: AC
  Administered 2017-03-19: 10 mL/h via INTRAVENOUS

## 2017-03-19 MED ORDER — ETOMIDATE 2 MG/ML IV SOLN
INTRAVENOUS | Status: AC | PRN
  Administered 2017-03-19: 20 mg via INTRAVENOUS

## 2017-03-19 MED ORDER — FENTANYL CITRATE (PF) 100 MCG/2ML IJ SOLN
INTRAMUSCULAR | Status: AC | PRN
  Administered 2017-03-19: 50 ug via INTRAVENOUS

## 2017-03-19 MED ORDER — CHLORHEXIDINE GLUCONATE CLOTH 2 % EX PADS
6.0000 | MEDICATED_PAD | Freq: Every day | CUTANEOUS | Status: DC
  Administered 2017-03-20: 6 via TOPICAL

## 2017-03-19 MED ORDER — INSULIN ASPART 100 UNIT/ML ~~LOC~~ SOLN
2.0000 [IU] | SUBCUTANEOUS | Status: DC
  Administered 2017-03-19 (×3): 4 [IU] via SUBCUTANEOUS
  Administered 2017-03-20 (×3): 2 [IU] via SUBCUTANEOUS
  Filled 2017-03-19: qty 1

## 2017-03-19 MED ORDER — FENTANYL CITRATE (PF) 100 MCG/2ML IJ SOLN
INTRAMUSCULAR | Status: DC | PRN
  Administered 2017-03-19: 50 ug via INTRAVENOUS

## 2017-03-19 MED ORDER — SODIUM CHLORIDE 0.9% FLUSH
10.0000 mL | Freq: Two times a day (BID) | INTRAVENOUS | Status: DC
  Administered 2017-03-20 – 2017-03-23 (×6): 10 mL

## 2017-03-19 MED ORDER — MIDAZOLAM HCL 2 MG/2ML IJ SOLN
INTRAMUSCULAR | Status: DC | PRN
  Administered 2017-03-19: 4 mg via INTRAVENOUS

## 2017-03-19 MED ORDER — MIDAZOLAM HCL 2 MG/2ML IJ SOLN
1.0000 mg | INTRAMUSCULAR | Status: DC | PRN
  Administered 2017-03-19 (×2): 1 mg via INTRAVENOUS
  Filled 2017-03-19: qty 2

## 2017-03-19 MED ORDER — SODIUM CHLORIDE 0.9 % IV SOLN: 250.0000 mL | INTRAVENOUS | Status: DC | PRN

## 2017-03-19 MED ORDER — PROTHROMBIN COMPLEX CONC HUMAN 500 UNITS IV KIT
2527.0000 [IU] | PACK | Status: AC
  Administered 2017-03-19: 2527 [IU] via INTRAVENOUS
  Filled 2017-03-19: qty 101

## 2017-03-19 MED ORDER — OCTREOTIDE ACETATE 500 MCG/ML IJ SOLN
50.0000 ug/h | INTRAMUSCULAR | Status: DC
  Filled 2017-03-19: qty 1

## 2017-03-19 MED ORDER — MIDAZOLAM HCL 5 MG/ML IJ SOLN
INTRAMUSCULAR | Status: AC
  Filled 2017-03-19: qty 2

## 2017-03-19 MED ORDER — FENTANYL CITRATE (PF) 100 MCG/2ML IJ SOLN
50.0000 ug | INTRAMUSCULAR | Status: DC | PRN
  Administered 2017-03-19 (×2): 50 ug via INTRAVENOUS
  Filled 2017-03-19: qty 2

## 2017-03-19 MED ORDER — ROCURONIUM BROMIDE 50 MG/5ML IV SOLN
INTRAVENOUS | Status: AC | PRN
  Administered 2017-03-19: 100 mg via INTRAVENOUS

## 2017-03-19 MED ORDER — SODIUM CHLORIDE 0.9% FLUSH
10.0000 mL | INTRAVENOUS | Status: DC | PRN
  Administered 2017-03-22: 20 mL
  Filled 2017-03-19: qty 40

## 2017-03-19 NOTE — Progress Notes (Signed)
Responded to page from ED Nurse to support daughters in consultation room. Patient has GI bleed. EDP spoke with family.  Patient being admitted. Chaplain will follow as needed.

## 2017-03-19 NOTE — Procedures (Signed)
Central Venous Cordis Catheter Insertion Procedure Note JOVI ALVIZO [id number] 1926/04/02  Procedure: Insertion of Central Venous Catheter Indications: Assessment of intravascular volume, Drug and/or fluid administration and Frequent blood sampling  Procedure Details Consent: Unable to obtain consent because of emergent medical necessity. Time Out: Verified patient identification, verified procedure, site/side was marked, verified correct patient position, special equipment/implants available, medications/allergies/relevent history reviewed, required imaging and test results available.  Performed Real time [place] was used to ID and cannulate vessel  Maximum sterile technique was used including antiseptics, cap, gloves, gown, hand hygiene, mask and sheet. Skin prep: Chlorhexidine; local anesthetic administered A antimicrobial bonded/coated triple lumen catheter was placed in the right internal jugular vein using the Seldinger technique.  Evaluation Blood flow good Complications: No apparent complications Patient did tolerate procedure well. Chest X-ray ordered to verify placement.  CXR: normal.  Clementeen Graham 03/26/2017, 4:12 PM  Erick Colace ACNP-BC [place] Pager # [phone] OR # [phone] if no answer

## 2017-03-19 NOTE — ED Notes (Signed)
EDP at bedside starting central line.

## 2017-03-19 NOTE — Op Note (Addendum)
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 Estimated blood loss:                            None. Estimated Blood Loss:     Estimated blood loss: none. Procedure:                Pre-Anesthesia Assessment:                           - Prior to the procedure, a History and Physical                            was performed, and patient medications and                            allergies were reviewed. The patient's tolerance of                            previous anesthesia was also reviewed. The risks                            and benefits of the procedure and the sedation                            options and risks were discussed with the patient.                            All questions were answered, and informed consent                            was obtained. Prior Anticoagulants: The patient has  taken Coumadin (warfarin), last dose was day of                            procedure. ASA Grade Assessment: IV - A patient                            with severe systemic disease that is a constant                            threat to life. After reviewing the risks and                            benefits, the patient was deemed in satisfactory   condition to undergo the procedure.                           After obtaining informed consent, the endoscope was                            passed under direct vision. Throughout the                            procedure, the patient's blood pressure, pulse, and                            oxygen saturations were monitored continuously. The                            EG-[id number] ([id number]) scope was introduced through the                            mouth, and advanced to the second part of duodenum.                            The upper GI endoscopy was accomplished without                            difficulty. The patient tolerated the procedure                            well. Scope In: Scope Out: Findings:      There was no blood in the UGI tract.      There was a small amount of solid food in the stomach.      The previously noted duodenal mass was still prenent, approximately the       same size. Not ulcerated. Impression:               - She has not been bleeding from the UGI tract.                           - If she continues to have signficant bleeding                            tonight she needs  a CT scan (bleeding protocol) and                            if positive should go to IR. Moderate Sedation:      none Recommendation:           - Observe patient in ICU for ongoing care.                           - NPO.                           - Continue present medications.                           - OK to extubate per ICU when safe per ICU team.                           - Will follow along. Procedure Code(s):        --- Professional ---                           [phone], Esophagogastroduodenoscopy, flexible,                            transoral; diagnostic, including collection of                            specimen(s) by brushing or washing, when performed                            (separate procedure) Diagnosis Code(s):        --- Professional ---                           K92.1,  Melena (includes Hematochezia) CPT copyright 2016 American Medical Association. All rights reserved. The codes documented in this report are preliminary and upon coder review may  be revised to meet current compliance requirements. Milus Banister, MD 04/06/2017 4:31:45 PM This report has been signed electronically. Number of Addenda: 0

## 2017-03-19 NOTE — ED Notes (Signed)
Pts family member placed in consultation room.

## 2017-03-19 NOTE — ED Notes (Signed)
20M called, bed ready.

## 2017-03-19 NOTE — ED Notes (Addendum)
Successful intubation 

## 2017-03-19 NOTE — ED Provider Notes (Addendum)
[facility] DEPT Provider Note   CSN: [id number] Arrival date & time: 03/18/2017  1327     History   Chief Complaint Chief Complaint  Patient presents with  . GI Bleeding    HPI [name] is a 83 y.o. female.   LEVEL 5 CAVEAT DUE TO AMS 83yo F w/ PMH including A fib on coumadin, DVT, CHF, T2DM, breast CA who p/w rectal bleeding. Patient was brought in by EMS from nursing facility after they noted bright red blood per rectum this morning. She was hypotensive in route by EMS with some confusion. The patient reports some pain in her rectum. She denies any chest pain or shortness of breath.   The history is provided by the patient.    Past Medical History:  Diagnosis Date  . Anemia   . Arthritis   . Atrial fibrillation ([place])   . Breast cancer ([place]) 2012   right  . CHF (congestive heart failure) ([place])   . Cholelithiasis   . DCIS (ductal carcinoma in situ) of breast 07/15/2010   DCIS s/p lumpectomy and radiation 2011  . Diabetes mellitus   . DVT (deep venous thrombosis) ([place])   . GERD (gastroesophageal reflux disease)   . GI bleed   . Heart murmur   . Hyperlipidemia   . Hypertension   . Peripheral neuropathy ([place])   . Shortness of breath dyspnea   . Syncope 05/09/2015    Patient Active Problem List   Diagnosis Date Noted  . GI bleed 04/06/2017  . Falls 03/09/2017  . Elevated troponin 03/09/2017  . Seizure ([place])   . Pressure injury of skin 08/26/2016  . Syncope 08/24/2016  . CKD (chronic kidney disease) stage 4, GFR 15-29 ml/min (HCC) 08/24/2016  . Thoracic compression fracture ([place]) 08/24/2016  . Diabetes mellitus type 2, uncontrolled ([place]) 08/24/2016  . Uncontrolled type 2 diabetes mellitus with complication ([place])   . Chronic diastolic congestive heart failure ([place]) 02/28/2016  . Lower urinary tract infectious disease   . Palliative care encounter   . Tricuspid valve regurgitation   . Pulmonary hypertensive venous disease 08/28/2015  . Normal  coronary arteries-June 2016 08/12/2015  . Severe obesity (BMI >= 40) ([place]) 06/09/2015  . Anterior dislocation of left shoulder 05/19/2015  . Pulmonary hypertension, moderate to severe ([place]) 05/19/2015  . Choledocholithiasis 10/14/2012  . 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 Duodenal ulcer with hemorrhage 09/03/2012  . Calculus of bile duct without mention of cholecystitis or obstruction 09/02/2012  . Calculus of gallbladder without mention of cholecystitis or obstruction 09/02/2012  . Chronic anticoagulation-Warfarin 08/30/2012  . 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 HYPERCHOLESTEROLEMIA 02/08/2010  . Hypertensive cardiovascular disease 02/08/2010  . Chronic atrial fibrillation- CHADs VASc =6 02/08/2010  . Mitral valve regurgitation with secondary [facility] 02/08/2010    Past Surgical History:  Procedure Laterality Date  . BREAST SURGERY    . CARDIAC CATHETERIZATION N/A 05/11/2015   Procedure: Left Heart Cath and Coronary Angiography;  Surgeon: [name], MD;  Location: [facility];  Service: Cardiovascular;  Laterality: N/A;  . CARDIAC CATHETERIZATION N/A 08/09/2015   Procedure: Right Heart Cath;  Surgeon: [name], MD;  Location: [facility];  Service: Cardiovascular;  Laterality: N/A;  . CATARACT EXTRACTION Left 05/08/2015  . ENDOSCOPIC RETROGRADE CHOLANGIOPANCREATOGRAPHY (ERCP) WITH PROPOFOL  10/14/2012   Procedure: ENDOSCOPIC RETROGRADE CHOLANGIOPANCREATOGRAPHY (ERCP) WITH PROPOFOL;  Surgeon: Milus Banister, MD;  Location: [facility] ENDOSCOPY;  Service: Endoscopy;  Laterality: N/A;  .  ESOPHAGOGASTRODUODENOSCOPY  09/01/2012   Procedure: ESOPHAGOGASTRODUODENOSCOPY (EGD);  Surgeon: Ladene Artist, MD,FACG;  Location: [facility] ENDOSCOPY;  Service: Endoscopy;  Laterality: N/A;  . ESOPHAGOGASTRODUODENOSCOPY  09/03/2012   Procedure: ESOPHAGOGASTRODUODENOSCOPY (EGD);  Surgeon: Inda Castle, MD;  Location: [facility];  Service: Endoscopy;  Laterality: N/A;  . EUS   10/14/2012   Procedure: UPPER ENDOSCOPIC ULTRASOUND (EUS) LINEAR;  Surgeon: Milus Banister, MD;  Location: [facility] ENDOSCOPY;  Service: Endoscopy;  Laterality: N/A;  . EUS  11/11/2012   Procedure: UPPER ENDOSCOPIC ULTRASOUND (EUS) LINEAR;  Surgeon: Milus Banister, MD;  Location: [facility] ENDOSCOPY;  Service: Endoscopy;  Laterality: N/A;    OB History    No data available       Home Medications    Prior to Admission medications   Medication Sig Start Date End Date Taking? Authorizing Provider  atorvastatin (LIPITOR) 20 MG tablet TAKE 1 TABLET BY MOUTH EVERY DAY Patient taking differently: TAKE 1 TABLET BY MOUTH EVERY night 04/14/16  Yes [name], MD  gabapentin (NEURONTIN) 300 MG capsule Take 1 capsule (300 mg total) by mouth at bedtime. 03/11/17  Yes Geradine Girt, DO  metoprolol succinate (TOPROL-XL) 50 MG 24 hr tablet Take 1 tablet (50 mg total) by mouth daily. Take with or immediately following a meal. 03/12/17  Yes Jessica U Vann, DO  OXYGEN Inhale 3 L into the lungs continuous.   Yes Historical Provider, MD  pantoprazole (PROTONIX) 40 MG tablet Take 1 tablet (40 mg total) by mouth 2 (two) times daily before a meal. Patient taking differently: Take 40 mg by mouth 2 (two) times daily.  09/07/12  Yes [name], MD  potassium chloride SA (K-DUR,KLOR-CON) 20 MEQ tablet Take 2 tablets (40 mEq total) by mouth daily. 01/26/17  Yes [name], MD  torsemide (DEMADEX) 20 MG tablet Take 3 tablets (60 mg total) by mouth every morning. 03/12/17  Yes Geradine Girt, DO  torsemide (DEMADEX) 20 MG tablet Take 2 tablets (40 mg total) by mouth daily. Take in the PM around 4 03/11/17  Yes Geradine Girt, DO  acetaminophen (TYLENOL) 500 MG tablet Take 1,000 mg by mouth 2 (two) times daily as needed for mild pain.     Historical Provider, MD  warfarin (COUMADIN) 2 MG tablet Take 3 mg by mouth daily.     Historical Provider, MD    Family History Family History  Problem Relation Age of Onset  . Hypertension  Father     Social History Social History  Substance Use Topics  . Smoking status: Never Smoker  . Smokeless tobacco: Never Used  . Alcohol use No     Allergies   Peach [prunus persica]   Review of Systems Review of Systems  UNABLE TO OBTAIN ROS 2/2 AMS Physical Exam Updated Vital Signs BP 122/90   Pulse (!) 119   Temp 98.5 F (36.9 C) (Oral)   Resp (!) 22   Ht 5\' 4"  (1.626 m)   Wt 234 lb (106.1 kg)   SpO2 96%   BMI 40.17 kg/m   Physical Exam  Constitutional: She appears well-nourished.  Ill appearing  HENT:  Head: Normocephalic and atraumatic.  Eyes: Conjunctivae are normal.  Neck: Neck supple.  Cardiovascular: Regular rhythm and normal heart sounds.  Tachycardia present.   No murmur heard. Pulmonary/Chest: Effort normal and breath sounds normal.  Abdominal: Soft. Bowel sounds are normal. She exhibits no distension. There is no tenderness.  Genitourinary:  Genitourinary Comments: Bright red  blood briskly bleeding from rectum  Musculoskeletal: She exhibits no edema.  Neurological: She is alert.  Oriented to person only, confused but alert  Skin: Skin is warm. She is diaphoretic.  Psychiatric: She has a normal mood and affect. Judgment normal.  Nursing note and vitals reviewed. Chaperone was present during exam.    ED Treatments / Results  Labs (all labs ordered are listed, but only abnormal results are displayed) Labs Reviewed  CBC - Abnormal; Notable for the following:       Result Value   WBC 12.6 (*)    RBC 3.24 (*)    Hemoglobin 9.3 (*)    HCT 29.0 (*)    RDW 15.9 (*)    Platelets 141 (*)    All other components within normal limits  BASIC METABOLIC PANEL - Abnormal; Notable for the following:    Chloride 98 (*)    Glucose, Bld 249 (*)    BUN 45 (*)    Creatinine, Ser 2.36 (*)    Calcium 7.7 (*)    GFR calc non Af Amer 17 (*)    GFR calc Af Amer 20 (*)    All other components within normal limits  I-STAT CHEM 8, ED - Abnormal; Notable  for the following:    Sodium 134 (*)    Chloride 95 (*)    BUN 46 (*)    Creatinine, Ser 2.50 (*)    Glucose, Bld 253 (*)    Calcium, Ion 0.90 (*)    Hemoglobin 9.5 (*)    HCT 28.0 (*)    All other components within normal limits  I-STAT CG4 LACTIC ACID, ED - Abnormal; Notable for the following:    Lactic Acid, Venous 3.12 (*)    All other components within normal limits  PROTIME-INR  APTT  PROTIME-INR  PROTIME-INR  TROPONIN I  TROPONIN I  LACTIC ACID, PLASMA  LACTIC ACID, PLASMA  HEMOGLOBIN AND HEMATOCRIT, BLOOD  HEMOGLOBIN AND HEMATOCRIT, BLOOD  I-STAT TROPOININ, ED  TYPE AND SCREEN  PREPARE FRESH FROZEN PLASMA  PREPARE RBC (CROSSMATCH)    EKG  EKG Interpretation None       Radiology No results found.  Procedures .Critical Care Performed by: Sharlett Iles Authorized by: Sharlett Iles   Critical care provider statement:    Critical care time (minutes):  60   Critical care time was exclusive of:  Separately billable procedures and treating other patients   Critical care was necessary to treat or prevent imminent or life-threatening deterioration of the following conditions:  Shock   Critical care was time spent personally by me on the following activities:  Development of treatment plan with patient or surrogate, discussions with consultants, evaluation of patient's response to treatment, obtaining history from patient or surrogate, ordering and performing treatments and interventions, ordering and review of laboratory studies, ordering and review of radiographic studies and re-evaluation of patient's condition   (including critical care time)  Medications Ordered in ED Medications  lidocaine (XYLOCAINE) 2 % injection (not administered)  0.9 %  sodium chloride infusion (not administered)  insulin aspart (novoLOG) injection 2-6 Units (not administered)  pantoprazole (PROTONIX) 80 mg in sodium chloride 0.9 % 100 mL IVPB (not administered)    pantoprazole (PROTONIX) 80 mg in sodium chloride 0.9 % 250 mL (0.32 mg/mL) infusion (not administered)  pantoprazole (PROTONIX) injection 40 mg (not administered)  0.9 %  sodium chloride infusion (not administered)  norepinephrine (LEVOPHED) 4 mg in dextrose 5 % 250 mL (  0.016 mg/mL) infusion (not administered)  fentaNYL (SUBLIMAZE) 100 MCG/2ML injection (not administered)  midazolam (VERSED) 2 MG/2ML injection (not administered)  midazolam (VERSED) injection (4 mg Intravenous Given 03/14/2017 1546)  fentaNYL (SUBLIMAZE) injection (50 mcg Intravenous Given 03/25/2017 1546)  prothrombin complex conc human (KCENTRA) IVPB 2,527 Units (2,527 Units Intravenous Given 03/11/2017 1355)  phytonadione (VITAMIN K) 10 mg in dextrose 5 % 50 mL IVPB (0 mg Intravenous Stopped 03/18/2017 1502)  0.9 %  sodium chloride infusion (10 mL/hr Intravenous New Bag/Given 03/12/2017 1411)     Initial Impression / Assessment and Plan / ED Course  I have reviewed the triage vital signs and the nursing notes.  Pertinent labs that were available during my care of the patient were reviewed by me and considered in my medical decision making (see chart for details).     Pt on coumadin for A fib p/w BRBPR noted by nursing facility today. On arrival she was ill-appearing, awake but confused, diaphoretic with bright red blood pooling in her bed. She was hypotensive and tachycardic. Immediately obtained IV access and placed the patient in resuscitation bay. Gave 2 units of trauma blood after verbal consent via rapid infuser. Gave Kcentra, FFP, IV vitamin K for coumadin reversal.  I attempted central line for cordis placement but was unsuccessful due to collapsing veins despite blood, fluids, and trendelenburg positioning. Contacted GI and discussed w/ Dr. Ardis Hughs, who evaluated pt in ED. Contacted CCM, discussed w/ Dr. Lake Bells. CCM will evaluate pt in ED to place central line and admit for further care.  Final Clinical Impressions(s) / ED  Diagnoses   Final diagnoses:  Acute GI bleeding  Hypovolemic shock [facility])    New Prescriptions New Prescriptions   No medications on file     Sharlett Iles, MD 03/14/2017 [place], MD 03/15/2017 [phone]

## 2017-03-19 NOTE — H&P (Signed)
PULMONARY / CRITICAL CARE MEDICINE   Name: [name] MRN: [id number] DOB: 1926/11/02    ADMISSION DATE:  03/26/2017 CONSULTATION DATE:  03/13/2017  REFERRING MD:  Dr. Baird Cancer   CHIEF COMPLAINT:  Acute GI Bleed   HISTORY OF PRESENT ILLNESS:   83 year old who resides at [facility] with PMH of Anemia, arthritis, A.Fib (on coumadin), CHF, MVR, HLD, oxygen dependent on 3L [place], CKD stage 4, GERD, HTN, HLD, and Breast Cancer, and duodenal mass. Presents to ED on 4/12 after being found by daughter in bed with bright red blood on sheets. Upon arrival to ED was hypotensive, tachycardic, with frank red blood "pouring from rectum". In ED patient received 2L NS and 4 units PRBC. GI consulted. PCCM to admit.   Of note patient admitted 4/1-4/4 for syncope secondary to orthostatic HTN and recent falls, diagnosised with E.Coli UTI and treated with Rocephin.   PAST MEDICAL HISTORY :  She  has a past medical history of Anemia; Arthritis; Atrial fibrillation ([place]); Breast cancer ([place]) (2012); CHF (congestive heart failure) ([place]); Cholelithiasis; DCIS (ductal carcinoma in situ) of breast (07/15/2010); Diabetes mellitus; DVT (deep venous thrombosis) ([place]); GERD (gastroesophageal reflux disease); GI bleed; Heart murmur; Hyperlipidemia; Hypertension; Peripheral neuropathy ([place]); Shortness of breath dyspnea; and Syncope (05/09/2015).  PAST SURGICAL HISTORY: She  has a past surgical history that includes Breast surgery; Esophagogastroduodenoscopy (09/01/2012); Esophagogastroduodenoscopy (09/03/2012); Endoscopic retrograde cholangiopancreatography (ercp) with propofol (10/14/2012); EUS (10/14/2012); EUS (11/11/2012); Cataract extraction (Left, 05/08/2015); Cardiac catheterization (N/A, 05/11/2015); and Cardiac catheterization (N/A, 08/09/2015).  Allergies  Allergen Reactions  . Peach [Prunus Persica] Rash    AGENT: peach fuzz    No current facility-administered medications on file prior to encounter.     Current Outpatient Prescriptions on File Prior to Encounter  Medication Sig  . atorvastatin (LIPITOR) 20 MG tablet TAKE 1 TABLET BY MOUTH EVERY DAY (Patient taking differently: TAKE 1 TABLET BY MOUTH EVERY night)  . gabapentin (NEURONTIN) 300 MG capsule Take 1 capsule (300 mg total) by mouth at bedtime.  . metoprolol succinate (TOPROL-XL) 50 MG 24 hr tablet Take 1 tablet (50 mg total) by mouth daily. Take with or immediately following a meal.  . OXYGEN Inhale 3 L into the lungs continuous.  . pantoprazole (PROTONIX) 40 MG tablet Take 1 tablet (40 mg total) by mouth 2 (two) times daily before a meal. (Patient taking differently: Take 40 mg by mouth 2 (two) times daily. )  . potassium chloride SA (K-DUR,KLOR-CON) 20 MEQ tablet Take 2 tablets (40 mEq total) by mouth daily.  Marland Kitchen torsemide (DEMADEX) 20 MG tablet Take 3 tablets (60 mg total) by mouth every morning.  . torsemide (DEMADEX) 20 MG tablet Take 2 tablets (40 mg total) by mouth daily. Take in the PM around 4  . acetaminophen (TYLENOL) 500 MG tablet Take 1,000 mg by mouth 2 (two) times daily as needed for mild pain.   Marland Kitchen warfarin (COUMADIN) 2 MG tablet Take 3 mg by mouth daily.     FAMILY HISTORY:  Her indicated that her mother is deceased. She indicated that her father is deceased.    SOCIAL HISTORY: She  reports that she has never smoked. She has never used smokeless tobacco. She reports that she does not drink alcohol or use drugs.  REVIEW OF SYSTEMS:   Intubated and sedated   SUBJECTIVE:  On mechanical ventilation.    VITAL SIGNS: BP 122/90   Pulse (!) 119   Temp 98.5 F (36.9 C) (Oral)  Resp (!) 22   Ht 5\' 4"  (1.626 m)   Wt 106.1 kg (234 lb)   SpO2 96%   BMI 40.17 kg/m   HEMODYNAMICS:    VENTILATOR SETTINGS:    INTAKE / OUTPUT: No intake/output data recorded.  PHYSICAL EXAMINATION: General:  Adult female, no distress  Neuro:  Sedated, does not follow commands, moves all extremities  HEENT:  ETT in place   Cardiovascular:  Tachy, Afib, no MRG, NI S1/S2 Lungs:  Non-labored, diminished breath sounds, no wheeze  Abdomen:  Non-distended, active bowel sounds  Musculoskeletal:  No acute  Skin:  Warm, dry, intact   LABS:  BMET  Recent Labs Lab 03/22/2017 1438 03/18/2017 1448  NA 135 134*  K 5.0 4.8  CL 98* 95*  CO2 24  --   BUN 45* 46*  CREATININE 2.36* 2.50*  GLUCOSE 249* 253*    Electrolytes  Recent Labs Lab 03/22/2017 1438  CALCIUM 7.7*    CBC  Recent Labs Lab 04/02/2017 1438 03/11/2017 1448  WBC 12.6*  --   HGB 9.3* 9.5*  HCT 29.0* 28.0*  PLT 141*  --     Coag's  Recent Labs Lab 03/11/2017 1438  APTT 29  INR 1.16    Sepsis Markers  Recent Labs Lab 03/22/2017 1448  LATICACIDVEN 3.12*    ABG No results for input(s): PHART, PCO2ART, PO2ART in the last 168 hours.  Liver Enzymes No results for input(s): AST, ALT, ALKPHOS, BILITOT, ALBUMIN in the last 168 hours.  Cardiac Enzymes No results for input(s): TROPONINI, PROBNP in the last 168 hours.  Glucose No results for input(s): GLUCAP in the last 168 hours.  Imaging No results found.   STUDIES:  CXR 4/12 >>  CULTURES: None.   ANTIBIOTICS: None.   SIGNIFICANT EVENTS: 4/12 > Presents to ED with GI bleed   LINES/TUBES: ETT 4/12 >> Right IJ [place] 4/12 >>  DISCUSSION: 83 year old female on coumadin with known duodenal mass presents with active GI bleed. Intubated in ED. GI to Scope with possible intervention.   ASSESSMENT / PLAN:  PULMONARY A: Chronic Hypoxic Respiratory Failure (on home 3L [place]) P:   Vent Support for GI (scoping and possible intervention) Wean as tolerated  Trend ABG and CXR   CARDIOVASCULAR A:  Hemorrhagic Shock secondary to active GI bleed  H/O HLD, HTN, A.fib  P:  Cardiac Monitoring  Maintain MAP >65 Wean Levophed to achieve MAP  Hold home Lipitor, Metoprolol  Hold coumadin   RENAL A:   Lactic Acidosis  CKD Stage 4 (Base Crt 1.9-2.4) P:   Trend BMP NS @  125 ml/hr   GASTROINTESTINAL A:   GI Bleed (on coumadin)  H/O GERD, Duodenal mass  P:   Gi consulted  Protonix GTT Trend H&H q6h   HEMATOLOGIC A:   GI bleeding as above  H/O Breast CA  P:  Trend CBC  Hold anticoagulation   INFECTIOUS A:   No issues  P:   Trend WBC and fever Curve   ENDOCRINE A:   Hyperglycemia    P:   SSI Q4H glucose checks   NEUROLOGIC A:   Encephalopathy secondary to sedation  P:   RASS goal: 0/-1 Monitor  Fentanyl PRN    FAMILY  - Updates: no family at bedside   - Inter-disciplinary family meet or Palliative Care meeting due by:  4/19    Hayden Pedro, AG-ACNP [facility]  Pgr: [phone]  PCCM Pgr: [phone]

## 2017-03-19 NOTE — ED Triage Notes (Signed)
Pt. Coming from [facility] via [facility] for GI bleed. Daughter noticed blood on sheets and called EMS. Hx of GI bleed. Pt. On coumadin. EDP at bedside. Pt. Had what looked like seizure activity. Frank red blood + stool pouring from brief. Emergency blood started. Pt. Alert.

## 2017-03-19 NOTE — ED Notes (Signed)
Cleaned pt and changed linens. Pt continues to bleed.

## 2017-03-19 NOTE — ED Notes (Signed)
Dr. Rex Kras at bedside placing cordis

## 2017-03-19 NOTE — ED Notes (Signed)
Second family member placed in consultation room.

## 2017-03-19 NOTE — Procedures (Signed)
Intubation Procedure Note [name] [id number] 1926-07-17  Procedure: Intubation Indications: Airway protection and maintenance   Procedure Details Consent: Risks of procedure as well as the alternatives and risks of each were explained to the (patient/caregiver).  Consent for procedure obtained. Consent obtained from patient.  Time Out: Verified patient identification, verified procedure, site/side was marked, verified correct patient position, special equipment/implants available, medications/allergies/relevent history reviewed, required imaging and test results available.    Performed Maximum sterile technique was used including gloves, gown, hand hygiene and mask.  MAC and 3/glidescope.  Premedicated with Versed 56m, Fentanyl 50 mcg, Etomidate 20 mg, and Rocuronium 100 mg.   Grade 1 airway view.  7.5 ETT placed at 22 cm at the lips.  Positive ETCO2 color change.  ETT secured with commercial holder.  Bilateral breath sounds equal with absent epigastric sounds.  Evaluation Hemodynamic Status: BP stable throughout; O2 sats: stable throughout Patient's Current Condition: stable Complications: No apparent complications Patient did tolerate procedure well. Chest X-ray ordered to verify placement.  CXR: pending.   Procedure performed under direct supervision of Dr. MLake Bells     [name] AGACNP-BC [facility] Pgr: [phone] if no answer [phone]/11/2017, 4:02 PM

## 2017-03-19 NOTE — Consult Note (Signed)
Consultation  Referring Provider: Dr. Rex Kras      Primary Care Physician:  Maximino Greenland, MD Primary Gastroenterologist:  Dr. Fuller Plan    Reason for Consultation:  Acute GI Bleed            HPI:   [name] is a 83 y.o. African American female with a past medical history significant for diastolic CHF, MVR, HLD, oxygen dependent on 3 L, syncope, CKD stage IV, who presented to the ED this afternoon with an acute GI bleed.   Patient was unable to provide history at time of my interview. Per review of intake notes patient was found by her daughter with bright red blood on her sheets and EMS was called. She is on Coumadin. Per nursing staff she has continued to have frank red blood pouring from her rectum since she has come to the ER. Hypotensive and currently receiving blood, with central line being placed.   She has history of bleeding duodenal mass last in 2013.  Past GI history: 11/11/12-Dr. Ardis Hughs, EUS: Done for overt GI bleeding from duodenal mass on Coumadin 6-8 weeks prior, underwent EUS 3 weeks prior with mucosal biopsies lesion which were nondiagnostic. 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  History garnered from previous records: Past Medical History:  Diagnosis Date  . Anemia   . Arthritis   . Atrial fibrillation ([place])   . Breast cancer ([place]) 2012   right  . CHF (congestive heart failure) ([place])   . Cholelithiasis   . DCIS (ductal carcinoma in situ) of breast 07/15/2010   DCIS s/p lumpectomy and radiation 2011  . Diabetes mellitus   . DVT (deep venous thrombosis) ([place])   . GERD (gastroesophageal reflux disease)   . GI bleed   . Heart murmur   . Hyperlipidemia   . Hypertension   . Peripheral neuropathy ([place])   . Shortness of breath dyspnea   . Syncope 05/09/2015    Past Surgical History:  Procedure Laterality Date  . BREAST SURGERY    . CARDIAC CATHETERIZATION N/A 05/11/2015   Procedure: Left Heart Cath and Coronary Angiography;  Surgeon:  [name], MD;  Location: [place];  Service: Cardiovascular;  Laterality: N/A;  . CARDIAC CATHETERIZATION N/A 08/09/2015   Procedure: Right Heart Cath;  Surgeon: [name], MD;  Location: [place];  Service: Cardiovascular;  Laterality: N/A;  . CATARACT EXTRACTION Left 05/08/2015  . ENDOSCOPIC RETROGRADE CHOLANGIOPANCREATOGRAPHY (ERCP) WITH PROPOFOL  10/14/2012   Procedure: ENDOSCOPIC RETROGRADE CHOLANGIOPANCREATOGRAPHY (ERCP) WITH PROPOFOL;  Surgeon: Milus Banister, MD;  Location: [facility];  Service: Endoscopy;  Laterality: N/A;  . ESOPHAGOGASTRODUODENOSCOPY  09/01/2012   Procedure: ESOPHAGOGASTRODUODENOSCOPY (EGD);  Surgeon: Ladene Artist, MD,FACG;  Location: [facility] ENDOSCOPY;  Service: Endoscopy;  Laterality: N/A;  . ESOPHAGOGASTRODUODENOSCOPY  09/03/2012   Procedure: ESOPHAGOGASTRODUODENOSCOPY (EGD);  Surgeon: Inda Castle, MD;  Location: [place];  Service: Endoscopy;  Laterality: N/A;  . EUS  10/14/2012   Procedure: UPPER ENDOSCOPIC ULTRASOUND (EUS) LINEAR;  Surgeon: Milus Banister, MD;  Location: [facility];  Service: Endoscopy;  Laterality: N/A;  . EUS  11/11/2012   Procedure: UPPER ENDOSCOPIC ULTRASOUND (EUS) LINEAR;  Surgeon: Milus Banister, MD;  Location: [facility]OS[facility];  Service: Endoscopy;  Laterality: N/A;    Family History  Problem Relation Age of Onset  . Hypertension Father     Social History  Substance Use Topics  . Smoking status: Never Smoker  .  Smokeless tobacco: Never Used  . Alcohol use No    Prior to Admission medications   Medication Sig Start Date End Date Taking? Authorizing Provider  acetaminophen (TYLENOL) 500 MG tablet Take 1,000 mg by mouth 2 (two) times daily as needed for mild pain.     Historical Provider, MD  atorvastatin (LIPITOR) 20 MG tablet TAKE 1 TABLET BY MOUTH EVERY DAY 04/14/16   [name], MD  gabapentin (NEURONTIN) 300 MG capsule Take 1 capsule (300 mg total) by mouth at bedtime. 03/11/17   Geradine Girt, DO  metoprolol succinate (TOPROL-XL) 50 MG 24 hr tablet Take 1 tablet (50 mg total) by mouth daily. Take with or immediately following a meal. 03/12/17   Geradine Girt, DO  OXYGEN Inhale 3 L into the lungs continuous.    Historical Provider, MD  pantoprazole (PROTONIX) 40 MG tablet Take 1 tablet (40 mg total) by mouth 2 (two) times daily before a meal. Patient taking differently: Take 40 mg by mouth daily.  09/07/12   [name], MD  potassium chloride SA (K-DUR,KLOR-CON) 20 MEQ tablet Take 2 tablets (40 mEq total) by mouth daily. Patient taking differently: Take 40 mEq by mouth every evening.  01/26/17   [name], MD  torsemide (DEMADEX) 20 MG tablet Take 3 tablets (60 mg total) by mouth every morning. 03/12/17   Geradine Girt, DO  torsemide (DEMADEX) 20 MG tablet Take 2 tablets (40 mg total) by mouth daily. Take in the PM around 4 03/11/17   Geradine Girt, DO  warfarin (COUMADIN) 2 MG tablet Take 3 mg by mouth daily.     Historical Provider, MD    Current Facility-Administered Medications  Medication Dose Route Frequency Provider Last Rate Last Dose  . lidocaine (XYLOCAINE) 2 % injection           . phytonadione (VITAMIN K) 10 mg in dextrose 5 % 50 mL IVPB  10 mg Intravenous STAT Sharlett Iles, MD 150 mL/hr at 03/28/2017 1423 10 mg at 03/26/2017 1423   Current Outpatient Prescriptions  Medication Sig Dispense Refill  . acetaminophen (TYLENOL) 500 MG tablet Take 1,000 mg by mouth 2 (two) times daily as needed for mild pain.     Marland Kitchen atorvastatin (LIPITOR) 20 MG tablet TAKE 1 TABLET BY MOUTH EVERY DAY 90 tablet 3  . gabapentin (NEURONTIN) 300 MG capsule Take 1 capsule (300 mg total) by mouth at bedtime.  3  . metoprolol succinate (TOPROL-XL) 50 MG 24 hr tablet Take 1 tablet (50 mg total) by mouth daily. Take with or immediately following a meal.    . OXYGEN Inhale 3 L into the lungs continuous.    . pantoprazole (PROTONIX) 40 MG tablet Take 1 tablet (40 mg total) by mouth 2 (two)  times daily before a meal. (Patient taking differently: Take 40 mg by mouth daily. ) 60 tablet 3  . potassium chloride SA (K-DUR,KLOR-CON) 20 MEQ tablet Take 2 tablets (40 mEq total) by mouth daily. (Patient taking differently: Take 40 mEq by mouth every evening. ) 60 tablet 3  . torsemide (DEMADEX) 20 MG tablet Take 3 tablets (60 mg total) by mouth every morning.    . torsemide (DEMADEX) 20 MG tablet Take 2 tablets (40 mg total) by mouth daily. Take in the PM around 4    . warfarin (COUMADIN) 2 MG tablet Take 3 mg by mouth daily.       Allergies as of 03/11/2017 - Review Complete 03/10/2017  Allergen Reaction Noted  . Peach [prunus persica] Rash 09/13/2012     Review of Systems:    Unable to complete due to apparent inability to answer questioning and acute situation with attempted placement of Central line   Physical Exam:  Vital signs in last 24 hours: Temp:  [98.5 F (36.9 C)] 98.5 F (36.9 C) (04/12 1350) Pulse Rate:  [98-110] 110 (04/12 1415) Resp:  [19-27] 27 (04/12 1415) BP: (111-125)/(79-84) 111/84 (04/12 1415) SpO2:  [91 %-100 %] 91 % (04/12 1415) Weight:  [234 lb (106.1 kg)] 234 lb (106.1 kg) (04/12 1350)    Limited due to emergency of patient's situation and team trying to place central line at time of my exam General:   Appears to be in NAD Lungs: Respirations even and unlabored. Lungs clear to auscultation bilaterally.     Heart: Normal S1, S2. No MRG. Regular rate and rhythm. Abdomen:  Soft, nondistended, nontender.  Rectal:  Overt GI bleed at time of exam, rapid blood flow Extremities:  Without edema, deformity of left foot Neurologic:  Conscious, AAO x0 Skin:   Dry and intact without significant lesions or rashes. Psychiatric: does not answer questions   LAB RESULTS: Pending at time of my interview  PREVIOUS ENDOSCOPIES:            See HPI   Impression / Plan:   Impression: 1. Acute GI Bleed: pt found at nursing facility with bright red blood on  sheets, on Coumadin, continues with large amount of brb from rectum continually flowing at time of exam, ER running multiple units of PRBCs and VIt k and trying to place central line- will need to get acute bleeding under control before we are available to evaluate further  Plan: 1. Patient undergoing acute GI bleed receiving multiple bags of prbc's and vit k- er team trying to place central line at time of my exam 2. Please await further recs from Dr. Ardis Hughs later this afternoon  Thank you for your kind consultation, we will continue to follow.  Lavone Nian [facility]  03/20/2017, 2:37 PM Pager #: [phone]  ________________________________________________________________________  Velora Heckler GI MD note:  I personally examined the patient, reviewed the data and agree with the assessment and plan described above. Having massive GI bleeding.  HArd to say if this is a brisk upper vs lower.  I discussed with her two daughters.  They understand it is life threatening.  I want to check the site of her duodenal mass with EGD emergently, this has bled before and could be bleeding again now.  She has received Kcentra to reverse her coumadin, also FFP and PRBC. She is getting intubated and a central line in the ER and the endo team is on the way down soon.   Owens Loffler, MD [facility] Pager [phone]

## 2017-03-19 NOTE — ED Notes (Signed)
Critical care at bedside to place central line 

## 2017-03-19 NOTE — ED Notes (Signed)
Notified CCMD of critical troponin and BP changes after versed and fentanyl. No further orders at this time. BP improving with increased rates of levophed.

## 2017-03-19 NOTE — ED Notes (Signed)
Blood was given emergently with the rapid infuser

## 2017-03-19 NOTE — ED Notes (Signed)
Pts watch and glasses placed in pt belonging bag.

## 2017-03-19 NOTE — ED Notes (Signed)
Gave pt's belongings- watch and glasses to pt's dtr

## 2017-03-19 NOTE — ED Notes (Signed)
EGD staff at bedside

## 2017-03-19 NOTE — ED Notes (Signed)
Pt received 2 more units PRBC emergently

## 2017-03-19 NOTE — ED Notes (Signed)
Bladder scan 150 mL

## 2017-03-19 NOTE — ED Notes (Signed)
Removed OG for EGD

## 2017-03-19 NOTE — ED Notes (Signed)
Pt continues to have bright red blood gushing from rectum. Chucks placed to try to control bleeding. MD at bedside. Pt alert

## 2017-03-19 NOTE — ED Notes (Addendum)
CRITICAL VALUE ALERT  Critical value received:  Troponin 0.03  Date of notification:  04/01/2017   Time of notification:  5681  Critical value read back:Yes.    Nurse who received alert:  Leodis Rains paged CCMD [phone]  MD notified (1st page):  [place] MD [place]. No further orders at this time 1856

## 2017-03-20 ENCOUNTER — Ambulatory Visit: Payer: Medicare HMO | Admitting: Neurology

## 2017-03-20 ENCOUNTER — Inpatient Hospital Stay (HOSPITAL_COMMUNITY): Payer: Medicare HMO

## 2017-03-20 ENCOUNTER — Encounter (HOSPITAL_COMMUNITY): Payer: Self-pay | Admitting: Gastroenterology

## 2017-03-20 DIAGNOSIS — R571 Hypovolemic shock: Secondary | ICD-10-CM

## 2017-03-20 DIAGNOSIS — Z9911 Dependence on respirator [ventilator] status: Secondary | ICD-10-CM

## 2017-03-20 LAB — PHOSPHORUS: Phosphorus: 1.8 mg/dL — ABNORMAL LOW (ref 2.5–4.6)

## 2017-03-20 LAB — PROTIME-INR
INR: 1.19
INR: 1.24
INR: 1.24
INR: 1.27
INR: 1.27
PROTHROMBIN TIME: 16 s — AB (ref 11.4–15.2)
PROTHROMBIN TIME: 15.6 s — AB (ref 11.4–15.2)
PROTHROMBIN TIME: 15.7 s — AB (ref 11.4–15.2)
Prothrombin Time: 15.2 seconds (ref 11.4–15.2)
Prothrombin Time: 16 seconds — ABNORMAL HIGH (ref 11.4–15.2)

## 2017-03-20 LAB — BLOOD PRODUCT ORDER (VERBAL) VERIFICATION

## 2017-03-20 LAB — CBC
HEMATOCRIT: 30.2 % — AB (ref 36.0–46.0)
HEMOGLOBIN: 9.9 g/dL — AB (ref 12.0–15.0)
MCH: 28.3 pg (ref 26.0–34.0)
MCHC: 32.8 g/dL (ref 30.0–36.0)
MCV: 86.3 fL (ref 78.0–100.0)
Platelets: 115 10*3/uL — ABNORMAL LOW (ref 150–400)
RBC: 3.5 MIL/uL — AB (ref 3.87–5.11)
RDW: 15.4 % (ref 11.5–15.5)
WBC: 16 10*3/uL — ABNORMAL HIGH (ref 4.0–10.5)

## 2017-03-20 LAB — URINALYSIS, ROUTINE W REFLEX MICROSCOPIC
BACTERIA UA: NONE SEEN
Bilirubin Urine: NEGATIVE
GLUCOSE, UA: NEGATIVE mg/dL
Hgb urine dipstick: NEGATIVE
KETONES UR: NEGATIVE mg/dL
NITRITE: NEGATIVE
Protein, ur: NEGATIVE mg/dL
SPECIFIC GRAVITY, URINE: 1.008 (ref 1.005–1.030)
pH: 5 (ref 5.0–8.0)

## 2017-03-20 LAB — HEMOGLOBIN AND HEMATOCRIT, BLOOD
HCT: 26.8 % — ABNORMAL LOW (ref 36.0–46.0)
HCT: 28.6 % — ABNORMAL LOW (ref 36.0–46.0)
HCT: 32.1 % — ABNORMAL LOW (ref 36.0–46.0)
HEMATOCRIT: 27.5 % — AB (ref 36.0–46.0)
Hemoglobin: 10.4 g/dL — ABNORMAL LOW (ref 12.0–15.0)
Hemoglobin: 8.5 g/dL — ABNORMAL LOW (ref 12.0–15.0)
Hemoglobin: 8.9 g/dL — ABNORMAL LOW (ref 12.0–15.0)
Hemoglobin: 9.3 g/dL — ABNORMAL LOW (ref 12.0–15.0)

## 2017-03-20 LAB — COMPREHENSIVE METABOLIC PANEL
ALT: 7 U/L — ABNORMAL LOW (ref 14–54)
ANION GAP: 7 (ref 5–15)
AST: 18 U/L (ref 15–41)
Albumin: 2.1 g/dL — ABNORMAL LOW (ref 3.5–5.0)
Alkaline Phosphatase: 70 U/L (ref 38–126)
BILIRUBIN TOTAL: 1 mg/dL (ref 0.3–1.2)
BUN: 27 mg/dL — AB (ref 6–20)
CHLORIDE: 109 mmol/L (ref 101–111)
CO2: 27 mmol/L (ref 22–32)
Calcium: 7.2 mg/dL — ABNORMAL LOW (ref 8.9–10.3)
Creatinine, Ser: 1.56 mg/dL — ABNORMAL HIGH (ref 0.44–1.00)
GFR, EST AFRICAN AMERICAN: 33 mL/min — AB (ref >60)
GFR, EST NON AFRICAN AMERICAN: 29 mL/min — AB (ref >60)
Glucose, Bld: 105 mg/dL — ABNORMAL HIGH (ref 65–99)
POTASSIUM: 3.2 mmol/L — AB (ref 3.5–5.1)
Sodium: 143 mmol/L (ref 135–145)
Total Protein: 5.2 g/dL — ABNORMAL LOW (ref 6.5–8.1)

## 2017-03-20 LAB — GLUCOSE, CAPILLARY
GLUCOSE-CAPILLARY: 122 mg/dL — AB (ref 65–99)
GLUCOSE-CAPILLARY: 122 mg/dL — AB (ref 65–99)
GLUCOSE-CAPILLARY: 126 mg/dL — AB (ref 65–99)
GLUCOSE-CAPILLARY: 77 mg/dL (ref 65–99)
GLUCOSE-CAPILLARY: 94 mg/dL (ref 65–99)
Glucose-Capillary: 108 mg/dL — ABNORMAL HIGH (ref 65–99)

## 2017-03-20 LAB — BASIC METABOLIC PANEL
Anion gap: 7 (ref 5–15)
BUN: 31 mg/dL — ABNORMAL HIGH (ref 6–20)
CHLORIDE: 108 mmol/L (ref 101–111)
CO2: 26 mmol/L (ref 22–32)
Calcium: 6.9 mg/dL — ABNORMAL LOW (ref 8.9–10.3)
Creatinine, Ser: 1.6 mg/dL — ABNORMAL HIGH (ref 0.44–1.00)
GFR calc Af Amer: 32 mL/min — ABNORMAL LOW (ref >60)
GFR calc non Af Amer: 28 mL/min — ABNORMAL LOW (ref >60)
GLUCOSE: 152 mg/dL — AB (ref 65–99)
POTASSIUM: 3.3 mmol/L — AB (ref 3.5–5.1)
Sodium: 141 mmol/L (ref 135–145)

## 2017-03-20 LAB — TROPONIN I: TROPONIN I: 0.07 ng/mL — AB (ref <0.03)

## 2017-03-20 LAB — MRSA PCR SCREENING: MRSA by PCR: NEGATIVE

## 2017-03-20 LAB — LACTIC ACID, PLASMA: Lactic Acid, Venous: 1.6 mmol/L (ref 0.5–1.9)

## 2017-03-20 LAB — MAGNESIUM: Magnesium: 1.9 mg/dL (ref 1.7–2.4)

## 2017-03-20 MED ORDER — METOPROLOL TARTRATE 5 MG/5ML IV SOLN
5.0000 mg | Freq: Four times a day (QID) | INTRAVENOUS | Status: DC | PRN
  Administered 2017-03-20: 5 mg via INTRAVENOUS
  Filled 2017-03-20: qty 5

## 2017-03-20 MED ORDER — CHLORHEXIDINE GLUCONATE CLOTH 2 % EX PADS
6.0000 | MEDICATED_PAD | Freq: Every day | CUTANEOUS | Status: DC
  Administered 2017-03-20: 6 via TOPICAL

## 2017-03-20 MED ORDER — POTASSIUM PHOSPHATES 15 MMOLE/5ML IV SOLN
40.0000 meq | Freq: Once | INTRAVENOUS | Status: DC
  Filled 2017-03-20: qty 9.09

## 2017-03-20 MED ORDER — DIGOXIN 0.25 MG/ML IJ SOLN
0.5000 mg | Freq: Once | INTRAMUSCULAR | Status: AC
Start: 2017-03-20 — End: 2017-03-20
  Administered 2017-03-20: 0.5 mg via INTRAVENOUS
  Filled 2017-03-20: qty 2

## 2017-03-20 MED ORDER — ORAL CARE MOUTH RINSE
15.0000 mL | Freq: Two times a day (BID) | OROMUCOSAL | Status: DC
  Administered 2017-03-20 – 2017-03-21 (×3): 15 mL via OROMUCOSAL

## 2017-03-20 MED ORDER — POTASSIUM PHOSPHATES 15 MMOLE/5ML IV SOLN
30.0000 mmol | Freq: Once | INTRAVENOUS | Status: AC
  Administered 2017-03-20: 30 mmol via INTRAVENOUS
  Filled 2017-03-20: qty 10

## 2017-03-20 MED ORDER — NOREPINEPHRINE BITARTRATE 1 MG/ML IV SOLN
0.0000 ug/min | INTRAVENOUS | Status: DC
  Administered 2017-03-20: 8 ug/min via INTRAVENOUS
  Filled 2017-03-20: qty 16

## 2017-03-20 MED ORDER — CHLORHEXIDINE GLUCONATE CLOTH 2 % EX PADS
6.0000 | MEDICATED_PAD | Freq: Every day | CUTANEOUS | Status: DC
  Administered 2017-03-21 – 2017-03-23 (×3): 6 via TOPICAL

## 2017-03-20 NOTE — Progress Notes (Signed)
[facility] Progress Note    Since last GI note: Only blood output since admitted to ICU last night was a small amount of red rectal bleeding.   Objective: Vital signs in last 24 hours: Temp:  [97.5 F (36.4 C)-98.9 F (37.2 C)] 98.9 F (37.2 C) (04/13 0711) Pulse Rate:  [52-144] 117 (04/13 1041) Resp:  [8-34] 26 (04/13 1041) BP: (64-163)/(47-114) 100/62 (04/13 1041) SpO2:  [87 %-100 %] 100 % (04/13 1000) FiO2 (%):  [40 %-100 %] 40 % (04/13 1041) Weight:  [234 lb (106.1 kg)-235 lb 10.8 oz (106.9 kg)] 235 lb 10.8 oz (106.9 kg) (04/12 1943) Last BM Date: 03/20/17 General: Intubated in ICU Heart: regular rate and rythm Abdomen: soft, non-tender, non-distended, normal bowel sounds   Lab Results:  Recent Labs  04/04/2017 1438 03/13/2017 1448 04/04/2017 1714 03/20/17 0601  WBC 12.6*  --   --  16.0*  HGB 9.3* 9.5* 10.3* 9.9*  PLT 141*  --   --  115*  MCV 89.5  --   --  86.3    Recent Labs  03/13/2017 1438 04/03/2017 1448 03/20/17 0601  NA 135 134* 141  K 5.0 4.8 3.3*  CL 98* 95* 108  CO2 24  --  26  GLUCOSE 249* 253* 152*  BUN 45* 46* 31*  CREATININE 2.36* 2.50* 1.60*  CALCIUM 7.7*  --  6.9*    Recent Labs  03/11/2017 1714 03/20/17 0601  INR 1.29 1.19     Studies/Results: Dg Chest Port 1 View  Result Date: 03/20/2017 CLINICAL DATA:  Ventilator dependent. EXAM: PORTABLE CHEST 1 VIEW COMPARISON:  Radiograph of March 19, 2017. FINDINGS: Endotracheal and nasogastric tubes are in grossly good position. Atherosclerosis of thoracic aorta is noted. Stable cardiomegaly. No pneumothorax or significant pleural effusion is noted. Mild bibasilar atelectasis or infiltrates are noted. Bony thorax is unremarkable. IMPRESSION: Stable support apparatus. Aortic atherosclerosis. Mild bibasilar subsegmental atelectasis or infiltrates. Electronically Signed   By: Marijo Conception, M.D.   On: 03/20/2017 07:18   Dg Chest Port 1 View  Result Date: 04/06/2017 CLINICAL DATA:   Encounter for intubation an orogastric tube placement. EXAM: PORTABLE CHEST 1 VIEW COMPARISON:  03/08/2017. FINDINGS: Enlarged cardiomediastinal silhouette. ET tube has been inserted, and lies 2.9 cm above the carina. Thoracic atherosclerosis. LEFT pleural effusion is redemonstrated. Pulmonary vascular congestion is observed in the lung bases. Pilar Plate pulmonary edema or consolidation is not established. IMPRESSION: ET tube 2.9 cm above the carina.  Stable aeration.  Cardiomegaly. Electronically Signed   By: Staci Righter M.D.   On: 03/16/2017 16:18   Dg Abd Portable 1 View  Result Date: 03/28/2017 CLINICAL DATA:  Orogastric tube placement EXAM: PORTABLE ABDOMEN - 1 VIEW COMPARISON:  04/03/2017 FINDINGS: The bowel gas pattern is normal. An orogastric tube appears to have been pulled back into the left upper quadrant into the expected location of the body of the stomach since prior exam and is no longer projecting over the left lower quadrant. No radio-opaque calculi or other significant radiographic abnormality are seen. IMPRESSION: Repositioned orogastric tube into the left upper quadrant of the abdomen along the expected location of the proximal body of the stomach. Electronically Signed   By: Ashley Royalty M.D.   On: 03/13/2017 17:38   Dg Abd Portable 1 View  Result Date: 03/27/2017 CLINICAL DATA:  Encounter for orogastric tube placement. EXAM: PORTABLE ABDOMEN - 1 VIEW COMPARISON:  08/28/2016. FINDINGS: Orogastric tube has been inserted and lies with its tip along the  greater curvature of the stomach. Gas pattern is nonobstructive. IMPRESSION: Satisfactory OG tube placement. Electronically Signed   By: Staci Righter M.D.   On: 03/08/2017 16:19     Medications: Scheduled Meds: . chlorhexidine gluconate (MEDLINE KIT)  15 mL Mouth Rinse BID  . Chlorhexidine Gluconate Cloth  6 each Topical Daily  . insulin aspart  2-6 Units Subcutaneous Q4H  . mouth rinse  15 mL Mouth Rinse QID  . [START ON Mar 24, 2017]  pantoprazole  40 mg Intravenous Q12H  . sodium chloride flush  10-40 mL Intracatheter Q12H   Continuous Infusions: . sodium chloride 125 mL/hr at 03/20/17 0539  . norepinephrine (LEVOPHED) Adult infusion 6 mcg/min (03/20/17 0950)  . pantoprozole (PROTONIX) infusion 8 mg/hr (03/20/17 0323)   PRN Meds:.sodium chloride, fentaNYL (SUBLIMAZE) injection, fentaNYL (SUBLIMAZE) injection, midazolam, midazolam, sodium chloride flush    Assessment/Plan: 83 y.o. female severe GI bleeding, lower GI; presented with hypotension and 'gushing' red rectal bleeding  EGD yesterday proves this was not a brisk UGI bleed. 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  She's had 4 units blood, also reversal of coumadin with Kcentra and (I think) FFP. Bleeding is clearly stopped or nearly stopped. She was 'gushing blood' in the ER but only a single small amount of red blood from rectum in about 12 hours now.  She will need colonoscopy this admission.  Her last one was around 2003 or 2004. Ct scan in 2013 reports some colon diverticulosis and that could certainly be the etiology of her bleeding.  She is still intubated but otherwise has stabilized.  Failed weaning trial earlier today, ICU to tray again to extubate later today.  Will follow along.    Dr. Collene Mares is covering [facility] this weekend.  Milus Banister, MD  03/20/2017, 10:46 AM [facility] Pager [phone]

## 2017-03-20 NOTE — Progress Notes (Signed)
Pt's HR 150s afib. Dr. Hulen Luster called and new orders carried out.

## 2017-03-20 NOTE — Progress Notes (Signed)
Paged Dr.Patel regarding pt medium bloody bowel movement with clots. Hgb is stable at 8.5. VSS. MD aware.  Arnell Sieving, RN

## 2017-03-20 NOTE — Procedures (Signed)
Extubation Procedure Note  Patient Details:   Name: [name] DOB: [birth year] MRN: [id number]   Airway Documentation:     Evaluation  O2 sats: stable throughout Complications: No apparent complications Patient did tolerate procedure well. Bilateral Breath Sounds: Clear, Diminished   Pt extubated per MD order. Placed pt on 4L [place], sats 100%, RR 20, HR 128. Cuff leak present prior to extubation. No stridor noted. Pt able to vocalize a little. Whispered her name. Will cont to monitor  Soyla Dryer 03/20/2017, 12:52 PM

## 2017-03-20 NOTE — Progress Notes (Addendum)
PULMONARY / CRITICAL CARE MEDICINE   Name: [name] MRN: [id number] DOB: 02/26/1926    ADMISSION DATE:  04/04/2017 CONSULTATION DATE:  03/17/2017  REFERRING MD:  Dr. Baird Cancer   CHIEF COMPLAINT:  Acute GI Bleed   HISTORY OF PRESENT ILLNESS:   83 y/o F (resides at [facility]) with PMH of Anemia, arthritis, A.Fib (on coumadin), CHF, MVR, HLD, oxygen dependent on 3L [place], CKD stage 4, GERD, HTN, HLD, and Breast Cancer, and duodenal mass. Presents to ED on 4/12 after being found by daughter in bed with bright red blood on sheets. Upon arrival to ED was hypotensive, tachycardic, with frank red blood "pouring from rectum". In ED patient received 2L NS and 4 units PRBC, Kcentra, FFP, IV vitamin K for coumadin reversal. Started on IV Protonix ggt. GI consulted and performed EGD 4/12 which was without acute bleeding or apparent source of bleeding. Duodenal mass still present. PCCM to admit. Intubated 4/12.   Of note patient admitted 4/1-4/4 for syncope secondary to orthostatic HTN and recent falls, diagnosised with E.Coli UTI 4/1 and treated with Rocephin. Contacted SNF, patient has not been on Abx since 4/5.   Interval History: Remains intubated, not currently on sedation. Requiring Levophed for pressure support (30). Afebrile overnight but leukocytosis 12.6 --> 16.  Hb 10.4 on admission, s/p 4 units of blood --> 9.9 this morning. CONTINUES TO HAVE BRBPR BUN 45 --> 31.  INR 1.1 EGD without blood in UGI. Did note continued presence of duodenal mass but without ulceration. Recommended CT scan (bleeding protocol) and IR consult should patient continue to bleed.   STUDIES:  CXR 4/12 - Satisfactory OG tube. Stable cardiomegaly.  EGD 4/12 - No blood in UGI. Duodenal mass still present, appears unchanged  CULTURES: Ucx 4/12 >   ANTIBIOTICS: None.   SIGNIFICANT EVENTS: 4/12 > Presents to ED with GI bleed. Transfused 4 units 4/12 EGD without acute blood loss 4/13 continues to have  BRBPR  LINES/TUBES: ETT 4/12 >> Right IJ Cortis 4/12 >>  PAST SURGICAL HISTORY: She  has a past surgical history that includes Breast surgery; Esophagogastroduodenoscopy (09/01/2012); Esophagogastroduodenoscopy (09/03/2012); Endoscopic retrograde cholangiopancreatography (ercp) with propofol (10/14/2012); EUS (10/14/2012); EUS (11/11/2012); Cataract extraction (Left, 05/08/2015); Cardiac catheterization (N/A, 05/11/2015); and Cardiac catheterization (N/A, 08/09/2015).  VITAL SIGNS: BP 118/65   Pulse 80   Temp 98.9 F (37.2 C) (Oral)   Resp (!) 21   Ht 5\' 4"  (1.626 m)   Wt 235 lb 10.8 oz (106.9 kg)   SpO2 100%   BMI 40.45 kg/m   VENTILATOR SETTINGS: Vent Mode: PRVC FiO2 (%):  [40 %-100 %] 40 % Set Rate:  [18 bmp] 18 bmp Vt Set:  [430 mL] 430 mL PEEP:  [5 cmH20] 5 cmH20 Plateau Pressure:  [25 cmH20-26 cmH20] 25 cmH20  INTAKE / OUTPUT: I/O last 3 completed shifts: In: 2336.7 [I.V.:2336.7] Out: 1200 [Urine:1200]  PHYSICAL EXAMINATION: General:  Adult female, sleeping. +ETT Neuro:  Sedated, does not follow commands, moves all extremities  Cardiovascular:  Tachy ~110, Afib, no MRG, NI S1/S2 Lungs: Non-labored, diminished breath sounds, no wheeze  Abdomen:  Non-distended, active bowel sounds  Musculoskeletal:  No acute  Skin:  Warm, dry, intact   BMET  Recent Labs Lab 03/09/2017 1438 03/25/2017 1448 03/20/17 0601  NA 135 134* 141  K 5.0 4.8 3.3*  CL 98* 95* 108  CO2 24  --  26  BUN 45* 46* 31*  CREATININE 2.36* 2.50* 1.60*  GLUCOSE 249* 253* 152*  Electrolytes  Recent Labs Lab 03/22/2017 1438 03/20/17 0601  CALCIUM 7.7* 6.9*  MG  --  1.9  PHOS  --  1.8*   CBC  Recent Labs Lab 04/06/2017 1438 03/14/2017 1448 03/29/2017 1714 03/20/17 0601  WBC 12.6*  --   --  16.0*  HGB 9.3* 9.5* 10.3* 9.9*  HCT 29.0* 28.0* 32.6* 30.2*  PLT 141*  --   --  PENDING   Coag's  Recent Labs Lab 03/15/2017 1438 04/01/2017 1714 03/20/17 0601  APTT 29  --   --   INR 1.16 1.29 1.19    Sepsis Markers  Recent Labs Lab 04/01/2017 0030 04/03/2017 1448 03/26/2017 1720  LATICACIDVEN 1.6 3.12* 1.4   ABG  Recent Labs Lab 03/27/2017 1925  PHART 7.409  PCO2ART 43.9  PO2ART 551.0*   Liver Enzymes No results for input(s): AST, ALT, ALKPHOS, BILITOT, ALBUMIN in the last 168 hours.  Cardiac Enzymes  Recent Labs Lab 03/22/2017 1720 03/20/17 0601  TROPONINI 0.03* 0.07*    Glucose  Recent Labs Lab 03/15/2017 1641 04/03/2017 2010 03/14/2017 2305 03/20/17 0339 03/20/17 0708  GLUCAP 192* 183* 203* 122* 126*   Imaging Dg Chest Port 1 View  Result Date: 03/20/2017 CLINICAL DATA:  Ventilator dependent. EXAM: PORTABLE CHEST 1 VIEW COMPARISON:  Radiograph of March 19, 2017. FINDINGS: Endotracheal and nasogastric tubes are in grossly good position. Atherosclerosis of thoracic aorta is noted. Stable cardiomegaly. No pneumothorax or significant pleural effusion is noted. Mild bibasilar atelectasis or infiltrates are noted. Bony thorax is unremarkable. IMPRESSION: Stable support apparatus. Aortic atherosclerosis. Mild bibasilar subsegmental atelectasis or infiltrates. Electronically Signed   By: Marijo Conception, M.D.   On: 03/20/2017 07:18   Dg Chest Port 1 View  Result Date: 04/05/2017 CLINICAL DATA:  Encounter for intubation an orogastric tube placement. EXAM: PORTABLE CHEST 1 VIEW COMPARISON:  03/08/2017. FINDINGS: Enlarged cardiomediastinal silhouette. ET tube has been inserted, and lies 2.9 cm above the carina. Thoracic atherosclerosis. LEFT pleural effusion is redemonstrated. Pulmonary vascular congestion is observed in the lung bases. Pilar Plate pulmonary edema or consolidation is not established. IMPRESSION: ET tube 2.9 cm above the carina.  Stable aeration.  Cardiomegaly. Electronically Signed   By: Staci Righter M.D.   On: 03/18/2017 16:18   Dg Abd Portable 1 View  Result Date: 03/24/2017 CLINICAL DATA:  Orogastric tube placement EXAM: PORTABLE ABDOMEN - 1 VIEW COMPARISON:   03/25/2017 FINDINGS: The bowel gas pattern is normal. An orogastric tube appears to have been pulled back into the left upper quadrant into the expected location of the body of the stomach since prior exam and is no longer projecting over the left lower quadrant. No radio-opaque calculi or other significant radiographic abnormality are seen. IMPRESSION: Repositioned orogastric tube into the left upper quadrant of the abdomen along the expected location of the proximal body of the stomach. Electronically Signed   By: Ashley Royalty M.D.   On: 03/31/2017 17:38   Dg Abd Portable 1 View  Result Date: 03/13/2017 CLINICAL DATA:  Encounter for orogastric tube placement. EXAM: PORTABLE ABDOMEN - 1 VIEW COMPARISON:  08/28/2016. FINDINGS: Orogastric tube has been inserted and lies with its tip along the greater curvature of the stomach. Gas pattern is nonobstructive. IMPRESSION: Satisfactory OG tube placement. Electronically Signed   By: Staci Righter M.D.   On: 04/05/2017 16:19   DISCUSSION: 83 year old female on coumadin with known duodenal mass presents with active GI bleed. Intubated in ED + s/p transfusion 4 units. EGD without active  bleeding, did show stable duodenal mass without ulceration. Continues to have rectal bleeding.   ASSESSMENT / PLAN:  PULMONARY A: Acute on Chronic Hypoxic Respiratory Failure (on home 3L ) P:   Vent Support  Wean as tolerated  Trend ABG and CXR   CARDIOVASCULAR A: Hemorrhagic Shock secondary to active GI bleed, sp transfusion 4 units, vitamin K, FFP and Kcentra. INR 1.3 on presentation, 1.2 now.  Still requiring Levophed Trop 0.03 > 0.07, has BL ~ 0.04 H/O HLD, HTN, A.fib  P:  Cardiac Monitoring  Trend trop Maintain MAP >65 Wean Levophed to achieve MAP  Hold home Lipitor, Metoprolol  Hold coumadin   RENAL A:   Lactic Acidosis  CKD Stage 4 (Base Crt 1.9-2.4), Cr 1.6 today, 2.3 on adm. P:   Trend BMP NS @ 125 ml/hr   GASTROINTESTINAL A:   GI Bleed (on  coumadin - but NOT therapeutic)  H/O GERD, Duodenal mass  P:   GI on board, appreciate recs! Protonix GTT Trend H&H q6h   HEMATOLOGIC A:   GI bleeding as above  H/O Breast CA  Platelets 141 on admission, Hx of intermittent mild thrombocytopenia.  P:  Trend CBC  Hold anticoagulation   INFECTIOUS A:   Recently treated for UTI per daughter, made it seem like she was still on therapy P:   Trend WBC and fever Curve  UA and urine culture  ENDOCRINE A:   Hyperglycemia    P:   SSI Q4H glucose checks   NEUROLOGIC A:   Not currently on sedation. P:   Monitor  Fentanyl, Versed PRN   FAMILY  -Updates: no family at bedside  -Inter-disciplinary family meet or Palliative Care meeting due by:  4/19   Attending:  I have seen and examined the patient with nurse practitioner/resident and agree with the note above.  We formulated the plan together and I elicited the following history.    Minimal bleeding overnight GI says she needs a colonoscopy Remains intubated Still on levophed overnight  On exam:  Gen: chronically ill appearing, obese HENT: NCAT ETT in place PULM: good air movement, vent supprot  Acute GI bleeding: now with minimal blood loss, Hgb stable UTI: recent, f/u u/a AKI improved> continue mild volume replacement  Acute on chronic respiratory failure with hypoxemia> SBT now, consider extubation Hypocalcemia> replace  My cc time 30 minutes  Roselie Awkward, MD [facility] Pager: [phone] Cell: [phone] After 3pm or if no response, call [phone]

## 2017-03-20 NOTE — Progress Notes (Signed)
[facility] Progress Note Patient Name: [name] DOB: [birth year] MRN: [id number]   Date of Service  03/20/2017  HPI/Events of Note  Fib rvr, no bleeding Borderline BP HR 120-130  eICU Interventions  Dig x 1      Intervention Category Intermediate Interventions: Arrhythmia - evaluation and management  Raylene Miyamoto. 03/20/2017, 6:05 PM

## 2017-03-20 NOTE — Progress Notes (Addendum)
Pt's daughter stated pt was recently being treated for UTI. Pt's urine cloudy, informed B. Molt, DO. Urine analysis ordered, collected and sent to lab.

## 2017-03-21 ENCOUNTER — Inpatient Hospital Stay (HOSPITAL_COMMUNITY): Payer: Medicare HMO

## 2017-03-21 ENCOUNTER — Encounter (HOSPITAL_COMMUNITY): Payer: Self-pay | Admitting: Radiology

## 2017-03-21 DIAGNOSIS — E876 Hypokalemia: Secondary | ICD-10-CM

## 2017-03-21 DIAGNOSIS — I482 Chronic atrial fibrillation: Secondary | ICD-10-CM

## 2017-03-21 DIAGNOSIS — J9601 Acute respiratory failure with hypoxia: Secondary | ICD-10-CM

## 2017-03-21 DIAGNOSIS — R739 Hyperglycemia, unspecified: Secondary | ICD-10-CM

## 2017-03-21 LAB — COMPREHENSIVE METABOLIC PANEL
ALT: 8 U/L — AB (ref 14–54)
ANION GAP: 8 (ref 5–15)
AST: 16 U/L (ref 15–41)
Albumin: 1.9 g/dL — ABNORMAL LOW (ref 3.5–5.0)
Alkaline Phosphatase: 62 U/L (ref 38–126)
BUN: 17 mg/dL (ref 6–20)
CHLORIDE: 111 mmol/L (ref 101–111)
CO2: 26 mmol/L (ref 22–32)
CREATININE: 1.32 mg/dL — AB (ref 0.44–1.00)
Calcium: 6.8 mg/dL — ABNORMAL LOW (ref 8.9–10.3)
GFR, EST AFRICAN AMERICAN: 40 mL/min — AB (ref >60)
GFR, EST NON AFRICAN AMERICAN: 35 mL/min — AB (ref >60)
Glucose, Bld: 91 mg/dL (ref 65–99)
POTASSIUM: 3.3 mmol/L — AB (ref 3.5–5.1)
SODIUM: 145 mmol/L (ref 135–145)
Total Bilirubin: 0.7 mg/dL (ref 0.3–1.2)
Total Protein: 4.5 g/dL — ABNORMAL LOW (ref 6.5–8.1)

## 2017-03-21 LAB — PROTIME-INR
INR: 1.31
INR: 1.39
PROTHROMBIN TIME: 16.4 s — AB (ref 11.4–15.2)
Prothrombin Time: 17.2 seconds — ABNORMAL HIGH (ref 11.4–15.2)

## 2017-03-21 LAB — GLUCOSE, CAPILLARY
GLUCOSE-CAPILLARY: 81 mg/dL (ref 65–99)
GLUCOSE-CAPILLARY: 79 mg/dL (ref 65–99)
GLUCOSE-CAPILLARY: 106 mg/dL — AB (ref 65–99)
GLUCOSE-CAPILLARY: 207 mg/dL — AB (ref 65–99)

## 2017-03-21 LAB — HEMOGLOBIN AND HEMATOCRIT, BLOOD
HCT: 24.5 % — ABNORMAL LOW (ref 36.0–46.0)
HCT: 24.9 % — ABNORMAL LOW (ref 36.0–46.0)
HEMATOCRIT: 24.4 % — AB (ref 36.0–46.0)
HEMATOCRIT: 23.3 % — AB (ref 36.0–46.0)
HEMOGLOBIN: 7.8 g/dL — AB (ref 12.0–15.0)
HEMOGLOBIN: 7.8 g/dL — AB (ref 12.0–15.0)
HEMOGLOBIN: 7.7 g/dL — AB (ref 12.0–15.0)
Hemoglobin: 8.2 g/dL — ABNORMAL LOW (ref 12.0–15.0)

## 2017-03-21 LAB — PREPARE RBC (CROSSMATCH)

## 2017-03-21 LAB — CBC
HEMATOCRIT: 23.9 % — AB (ref 36.0–46.0)
Hemoglobin: 7.9 g/dL — ABNORMAL LOW (ref 12.0–15.0)
MCH: 28.9 pg (ref 26.0–34.0)
MCHC: 33.1 g/dL (ref 30.0–36.0)
MCV: 87.5 fL (ref 78.0–100.0)
PLATELETS: 74 10*3/uL — AB (ref 150–400)
RBC: 2.73 MIL/uL — ABNORMAL LOW (ref 3.87–5.11)
RDW: 15.2 % (ref 11.5–15.5)
WBC: 16.5 10*3/uL — AB (ref 4.0–10.5)

## 2017-03-21 MED ORDER — NOREPINEPHRINE BITARTRATE 1 MG/ML IV SOLN
0.0000 ug/min | INTRAVENOUS | Status: DC
  Administered 2017-03-21: 4 ug/min via INTRAVENOUS
  Administered 2017-03-21: 20 ug/min via INTRAVENOUS
  Filled 2017-03-21 (×4): qty 4

## 2017-03-21 MED ORDER — MIDAZOLAM HCL 2 MG/2ML IJ SOLN
INTRAMUSCULAR | Status: AC
  Filled 2017-03-21: qty 2

## 2017-03-21 MED ORDER — MIDAZOLAM HCL 2 MG/2ML IJ SOLN: 1.0000 mg | INTRAMUSCULAR | Status: DC | PRN

## 2017-03-21 MED ORDER — MIDAZOLAM HCL 2 MG/2ML IJ SOLN
2.0000 mg | Freq: Once | INTRAMUSCULAR | Status: AC
  Administered 2017-03-21: 2 mg via INTRAVENOUS

## 2017-03-21 MED ORDER — SODIUM CHLORIDE 0.9 % IV SOLN
Freq: Once | INTRAVENOUS | Status: AC
  Administered 2017-03-21: 14:00:00 via INTRAVENOUS

## 2017-03-21 MED ORDER — SODIUM CHLORIDE 0.9 % IV SOLN
Freq: Once | INTRAVENOUS | Status: AC
  Administered 2017-03-21: 13:00:00 via INTRAVENOUS

## 2017-03-21 MED ORDER — INSULIN ASPART 100 UNIT/ML ~~LOC~~ SOLN
0.0000 [IU] | SUBCUTANEOUS | Status: DC
  Administered 2017-03-21: 5 [IU] via SUBCUTANEOUS
  Administered 2017-03-22: 3 [IU] via SUBCUTANEOUS
  Administered 2017-03-22 (×2): 5 [IU] via SUBCUTANEOUS
  Administered 2017-03-22: 2 [IU] via SUBCUTANEOUS
  Administered 2017-03-22: 3 [IU] via SUBCUTANEOUS
  Administered 2017-03-23: 2 [IU] via SUBCUTANEOUS

## 2017-03-21 MED ORDER — FENTANYL 2500MCG IN NS 250ML (10MCG/ML) PREMIX INFUSION
25.0000 ug/h | INTRAVENOUS | Status: DC
  Administered 2017-03-21: 25 ug/h via INTRAVENOUS
  Administered 2017-03-22 – 2017-03-23 (×2): 200 ug/h via INTRAVENOUS
  Filled 2017-03-21 (×3): qty 250

## 2017-03-21 MED ORDER — ETOMIDATE 2 MG/ML IV SOLN
0.3000 mg/kg | Freq: Once | INTRAVENOUS | Status: AC
  Administered 2017-03-21: 20 mg via INTRAVENOUS
  Filled 2017-03-21: qty 16.35

## 2017-03-21 MED ORDER — CHLORHEXIDINE GLUCONATE 0.12% ORAL RINSE (MEDLINE KIT)
15.0000 mL | Freq: Two times a day (BID) | OROMUCOSAL | Status: DC
  Administered 2017-03-21 – 2017-03-23 (×4): 15 mL via OROMUCOSAL

## 2017-03-21 MED ORDER — FENTANYL CITRATE (PF) 100 MCG/2ML IJ SOLN
50.0000 ug | Freq: Once | INTRAMUSCULAR | Status: AC
  Administered 2017-03-21: 100 ug via INTRAVENOUS

## 2017-03-21 MED ORDER — IOPAMIDOL (ISOVUE-370) INJECTION 76%
INTRAVENOUS | Status: AC
Start: 2017-03-21 — End: 2017-03-21
  Administered 2017-03-21: 100 mL
  Filled 2017-03-21: qty 100

## 2017-03-21 MED ORDER — FENTANYL CITRATE (PF) 100 MCG/2ML IJ SOLN
INTRAMUSCULAR | Status: AC
  Filled 2017-03-21: qty 2

## 2017-03-21 MED ORDER — ORAL CARE MOUTH RINSE
15.0000 mL | Freq: Four times a day (QID) | OROMUCOSAL | Status: DC
  Administered 2017-03-21 – 2017-03-23 (×9): 15 mL via OROMUCOSAL

## 2017-03-21 MED ORDER — FENTANYL CITRATE (PF) 100 MCG/2ML IJ SOLN: 50.0000 ug | Freq: Once | INTRAMUSCULAR | Status: AC

## 2017-03-21 MED ORDER — FENTANYL BOLUS VIA INFUSION
25.0000 ug | INTRAVENOUS | Status: DC | PRN
  Administered 2017-03-23: 25 ug via INTRAVENOUS
  Filled 2017-03-21: qty 25

## 2017-03-21 MED ORDER — ALBUTEROL SULFATE (2.5 MG/3ML) 0.083% IN NEBU: 2.5000 mg | INHALATION_SOLUTION | RESPIRATORY_TRACT | Status: DC | PRN

## 2017-03-21 MED ORDER — DEXTROSE 5 % IV SOLN
160.0000 mg | Freq: Two times a day (BID) | INTRAVENOUS | Status: DC
  Administered 2017-03-21 (×2): 160 mg via INTRAVENOUS
  Filled 2017-03-21 (×3): qty 10

## 2017-03-21 MED ORDER — TECHNETIUM TC 99M-LABELED RED BLOOD CELLS IV KIT
25.0000 | PACK | Freq: Once | INTRAVENOUS | Status: AC | PRN
  Administered 2017-03-21: 25 via INTRAVENOUS

## 2017-03-21 MED ORDER — POTASSIUM CHLORIDE 2 MEQ/ML IV SOLN
30.0000 meq | Freq: Once | INTRAVENOUS | Status: AC
  Administered 2017-03-21: 30 meq via INTRAVENOUS
  Filled 2017-03-21: qty 15

## 2017-03-21 MED ORDER — NOREPINEPHRINE BITARTRATE 1 MG/ML IV SOLN
0.0000 ug/min | INTRAVENOUS | Status: DC
  Administered 2017-03-21: 30 ug/min via INTRAVENOUS
  Administered 2017-03-22: 22 ug/min via INTRAVENOUS
  Filled 2017-03-21 (×4): qty 16

## 2017-03-21 MED ORDER — MIDAZOLAM HCL 2 MG/2ML IJ SOLN
1.0000 mg | INTRAMUSCULAR | Status: DC | PRN
  Administered 2017-03-23: 1 mg via INTRAVENOUS
  Filled 2017-03-21: qty 2

## 2017-03-21 MED ORDER — METOPROLOL TARTRATE 5 MG/5ML IV SOLN
2.5000 mg | Freq: Four times a day (QID) | INTRAVENOUS | Status: DC
  Filled 2017-03-21 (×5): qty 5

## 2017-03-21 MED ORDER — ROCURONIUM BROMIDE 50 MG/5ML IV SOLN
1.0000 mg/kg | Freq: Once | INTRAVENOUS | Status: AC
  Administered 2017-03-21: 50 mg via INTRAVENOUS
  Filled 2017-03-21: qty 10.9

## 2017-03-21 NOTE — Progress Notes (Signed)
Pharmacy Antibiotic Note  [name] is a 83 y.o. female admitted on 04/06/2017 with UTI.  Pharmacy has been consulted for Bactrim dosing. Patient is afebrile, WBC 16 and urine culture has been sent.  Plan: - Bactrim 800-160 mg IV BID x 3 days - Follow up ability to change to PO - Follow up urine culture, renal function and clinical progression  Height: 5\' 4"  (162.6 cm) Weight: 240 lb 4.8 oz (109 kg) IBW/kg (Calculated) : 54.7  Temp (24hrs), Avg:98 F (36.7 C), Min:97.4 F (36.3 C), Max:98.5 F (36.9 C)   Recent Labs Lab 03/25/2017 0030 03/08/2017 1438 03/10/2017 1448 03/29/2017 1720 03/20/17 0601 03/20/17 1245 03/21/17 0445  WBC  --  12.6*  --   --  16.0*  --   --   CREATININE  --  2.36* 2.50*  --  1.60* 1.56* 1.32*  LATICACIDVEN 1.6  --  3.12* 1.4  --   --   --     Estimated Creatinine Clearance: 35.5 mL/min (A) (by C-G formula based on SCr of 1.32 mg/dL (H)).    Allergies  Allergen Reactions  . Peach [Prunus Persica] Rash    AGENT: peach fuzz    Antimicrobials this admission: Bactrim 4/14 >> (4/16)  Microbiology results: 4/14 UCx: sent   4/12 MRSA PCR: negative  Thank you for allowing pharmacy to be a part of this patient's care.  Dimitri Ped, PharmD, BCPS PGY-2 Infectious Diseases Pharmacy Resident Pager: [phone] 03/21/2017 9:32 AM

## 2017-03-21 NOTE — Progress Notes (Signed)
PULMONARY / CRITICAL CARE MEDICINE   Name: [name] MRN: [id number] DOB: 12-Jun-1926    ADMISSION DATE:  03/20/2017 CONSULTATION DATE:  04/04/2017  REFERRING MD:  Dr. Baird Cancer   CHIEF COMPLAINT:  Acute GI Bleed   HISTORY OF PRESENT ILLNESS:   83 y/o F (resides at [facility] with PMH of Anemia, arthritis, A.Fib (on coumadin), CHF, MVR, HLD, oxygen dependent on 3L [place], CKD stage 4, GERD, HTN, HLD, and Breast Cancer, and duodenal mass. Presents to ED on 4/12 after being found by daughter in bed with bright red blood on sheets. Upon arrival to ED was hypotensive, tachycardic, with frank red blood "pouring from rectum". In ED patient received 2L NS and 4 units PRBC, Kcentra, FFP, IV vitamin K for coumadin reversal. Started on IV Protonix ggt. GI consulted and performed EGD 4/12 which was without acute bleeding or apparent source of bleeding. Duodenal mass still present. PCCM to admit. Intubated 4/12.   Of note patient admitted 4/1-4/4 for syncope secondary to orthostatic HTN and recent falls, diagnosised with E.Coli UTI 4/1 and treated with Rocephin. Contacted SNF, patient has not been on Abx since 4/5.   Interval History: Extubated yesterday  No more bleeding No belly pain  STUDIES:  CXR 4/12 - Satisfactory OG tube. Stable cardiomegaly.  EGD 4/12 - No blood in UGI. Duodenal mass still present, appears unchanged  CULTURES: Ucx 4/12 >   ANTIBIOTICS: None.   SIGNIFICANT EVENTS: 4/12 > Presents to ED with GI bleed. Transfused 4 units 4/12 EGD without acute blood loss 4/13 continues to have BRBPR 4/13 Extubated  LINES/TUBES: ETT 4/12 >> 4/13 Right IJ Cortis 4/12 >>  PAST SURGICAL HISTORY: She  has a past surgical history that includes Breast surgery; Esophagogastroduodenoscopy (09/01/2012); Esophagogastroduodenoscopy (09/03/2012); Endoscopic retrograde cholangiopancreatography (ercp) with propofol (10/14/2012); EUS (10/14/2012); EUS (11/11/2012); Cataract extraction (Left,  05/08/2015); Cardiac catheterization (N/A, 05/11/2015); Cardiac catheterization (N/A, 08/09/2015); and Esophagogastroduodenoscopy (N/A, 03/28/2017).  VITAL SIGNS: BP 109/63 (BP Location: Left Arm)   Pulse 89   Temp 97.8 F (36.6 C) (Oral)   Resp (!) 23   Ht 5\' 4"  (1.626 m)   Wt 109 kg (240 lb 4.8 oz)   SpO2 100%   BMI 41.25 kg/m   VENTILATOR SETTINGS: Vent Mode: CPAP;PSV FiO2 (%):  [40 %] 40 % Set Rate:  [18 bmp] 18 bmp Vt Set:  [430 mL] 430 mL PEEP:  [5 cmH20] 5 cmH20 Pressure Support:  [15 cmH20] 15 cmH20 Plateau Pressure:  [18 cmH20] 18 cmH20  INTAKE / OUTPUT: I/O last 3 completed shifts: In: 6612.7 [I.V.:5837.7; IV Piggyback:775] Out: 2865 [Urine:2865]  PHYSICAL EXAMINATION: General:  Chronically ill appearing, resting comfortably in bed HENT: NCAT OP clear PULM: CTA B, normal effort CV: Irreg irreg, no mgr GI: BS+, soft, nontender MSK: normal bulk and tone Neuro: awake, alert, no distress, MAEW   BMET  Recent Labs Lab 03/20/17 0601 03/20/17 1245 03/21/17 0445  NA 141 143 145  K 3.3* 3.2* 3.3*  CL 108 109 111  CO2 26 27 26   BUN 31* 27* 17  CREATININE 1.60* 1.56* 1.32*  GLUCOSE 152* 105* 91   Electrolytes  Recent Labs Lab 03/20/17 0601 03/20/17 1245 03/21/17 0445  CALCIUM 6.9* 7.2* 6.8*  MG 1.9  --   --   PHOS 1.8*  --   --    CBC  Recent Labs Lab 03/18/2017 1438  03/20/17 0601  03/20/17 1830 03/21/17 0000 03/21/17 0445  WBC 12.6*  --  16.0*  --   --   --   --  HGB 9.3*  < > 9.9*  < > 8.5* 8.2* 7.8*  HCT 29.0*  < > 30.2*  < > 26.8* 24.9* 24.4*  PLT 141*  --  115*  --   --   --   --   < > = values in this interval not displayed. Coag's  Recent Labs Lab 04/06/2017 1438  03/20/17 1800 03/20/17 1830 03/21/17 0446  APTT 29  --   --   --   --   INR 1.16  < > 1.24 1.24 1.31  < > = values in this interval not displayed. Sepsis Markers  Recent Labs Lab 04/03/2017 0030 03/22/2017 1448 03/08/2017 1720  LATICACIDVEN 1.6 3.12* 1.4    ABG  Recent Labs Lab 03/16/2017 1925  PHART 7.409  PCO2ART 43.9  PO2ART 551.0*   Liver Enzymes  Recent Labs Lab 03/20/17 1245 03/21/17 0445  AST 18 16  ALT 7* 8*  ALKPHOS 70 62  BILITOT 1.0 0.7  ALBUMIN 2.1* 1.9*    Cardiac Enzymes  Recent Labs Lab 04/02/2017 1720 03/20/17 0601  TROPONINI 0.03* 0.07*    Glucose  Recent Labs Lab 03/20/17 1109 03/20/17 1600 03/20/17 1917 03/20/17 2340 03/21/17 0350 03/21/17 0735  GLUCAP 94 108* 122* 77 81 79   Imaging No results found. DISCUSSION: 83 year old female on coumadin with known duodenal mass presents with active GI bleed. Intubated in ED + s/p transfusion 4 units. EGD without active bleeding, did show stable duodenal mass without ulceration. No further bleeding as of 4/14.   ASSESSMENT / PLAN:  PULMONARY A: Acute on Chronic Hypoxic Respiratory Failure (on home 3L [place]) > resolved P:   Maintain O2 saturation > 90% Incentive spirometry Out of bed as tolerated  CARDIOVASCULAR A: Hemorrhagic Shock secondary to active GI bleed >resolved Demand ischemia H/O HLD, HTN,  A.fib > with RVR overnight P:  Tele Restart low dose scheduled IV metoprolol PRN metoprolol for sustained rate > 110 Hold oral statin until taking PO  RENAL A:   Acute on chronic renal failure better P:   KVO fluids Monitor BMET and UOP Replace electrolytes as needed   GASTROINTESTINAL A:   GI Bleed> source uncertain H/O GERD, Duodenal mass  P:   GI following> plan colonoscopy? NPO until seen by GI today Protonix GTT Cbc q shift  HEMATOLOGIC A:   Hemorrhagic anemia P:  Transfuse for Hgb < 7gm/dL  INFECTIOUS A:   UTI > persistent pyuria P:   Add bactrim x3 days Urine culture today  ENDOCRINE A:   Hyperglycemia    P:   SSI Q4H glucose checks   NEUROLOGIC A:   No acute issues P:   Monitor neuro status D/C sedatino protocol    FAMILY  -Updates: updated family on 4/14 Transfer to [facility] service, SDU  today   Roselie Awkward, MD [facility] Pager: [phone] Cell: [phone] After 3pm or if no response, call [phone]

## 2017-03-21 NOTE — Progress Notes (Signed)
Large Blood with clots per rectum noted. MD/GI at the bedside and aware. Waiting on new orders

## 2017-03-21 NOTE — Progress Notes (Signed)
[facility]  Called back emergently to the bedside this afternoon due to sudden lower GI bleeding.  This is associated with hypotension and Afib with RVR.  Vitals:   03/21/17 0900 03/21/17 1000 03/21/17 1100 03/21/17 1135  BP: (!) 113/58 108/75 118/60   Pulse: 96 93 97   Resp: (!) 25 20 (!) 22   Temp:    98 F (36.7 C)  TempSrc:    Oral  SpO2: 100% 99% 100%   Weight:      Height:       Gen: acutely ill appearing Neuro: still awake, alert, conversant  CBC    Component Value Date/Time   WBC 16.0 (H) 03/20/2017 0601   RBC 3.50 (L) 03/20/2017 0601   HGB 7.8 (L) 03/21/2017 1100   HGB 9.1 (L) 05/02/2016 0946   HCT 24.5 (L) 03/21/2017 1100   HCT 29.2 (L) 05/02/2016 0946   PLT 115 (L) 03/20/2017 0601   PLT 145 05/02/2016 0946   MCV 86.3 03/20/2017 0601   MCV 98.5 05/02/2016 0946   MCH 28.3 03/20/2017 0601   MCHC 32.8 03/20/2017 0601   RDW 15.4 03/20/2017 0601   RDW 13.1 05/02/2016 0946   LYMPHSABS 2.0 03/08/2017 2100   LYMPHSABS 1.8 05/02/2016 0946   MONOABS 0.9 03/08/2017 2100   MONOABS 0.6 05/02/2016 0946   EOSABS 0.1 03/08/2017 2100   EOSABS 0.2 05/02/2016 0946   BASOSABS 0.0 03/08/2017 2100   BASOSABS 0.0 05/02/2016 0946   PT/INR 1.3  Impression/Plan: Acute lower GI Bleeding: 2 U PRBC stat, 2 U FFP stat, have discussed with IR and GI, plan CT angiogram now then IR embolization, intubate to stabilize airway to facilitate procedure  Hemorrhagic shock > continue full resuscitation measures now, volume resuscitate now  Daughter updated  CC time [place], MD [place] P[facility]M Pager: [phone] Cell: [phone] After 3pm or if no response, call [phone]

## 2017-03-21 NOTE — Progress Notes (Signed)
Intubation Procedure Note [name] [id number] 08-24-1926  Procedure: Intubation Indications: Respiratory insufficiency  Procedure Details Consent: Risks of procedure as well as the alternatives and risks of each were explained to the (patient/caregiver).  Consent for procedure obtained. Time Out: Verified patient identification, verified procedure, site/side was marked, verified correct patient position, special equipment/implants available, medications/allergies/relevent history reviewed, required imaging and test results available.  Performed  Drugs Etomidate 20mg , Rocuronium 100mg  IV, Versed 2mg  IV DL x 1 with GS 3 blade Grade 1 view 7.5 ET tube passed through cords under direct visualization Placement confirmed with bilateral breath sounds, positive EtCO2 change and smoke in tube   Evaluation Hemodynamic Status: BP stable throughout; O2 sats: stable throughout Patient's Current Condition: stable Complications: No apparent complications Patient did tolerate procedure well. Chest X-ray ordered to verify placement.  CXR: pending.   Simonne Maffucci 03/21/2017

## 2017-03-21 NOTE — Progress Notes (Signed)
Around 1300 patient bleeding increased,  BP decreased with SVT. MD at the bedside called. Emergently 2 RBC and 2FFP were given. Pt was intubated (versed, Fentanyl, Amodate and Roc.) were used. X ray was done.  IV 20 G in [facility] were attempted. IV team called and bedside. Attempted as well, no result. Pt was taken to CT. Large bleeding again was cleaned.  IV was attempted by CT nurse as well. No result. Per MD was ok to use peripheral in the hand. From CT pt transferred to Nuclear medicine. Started at 1610. RT at the bedside.

## 2017-03-21 NOTE — Evaluation (Signed)
Clinical/Bedside Swallow Evaluation Patient Details  Name: [name] MRN: [id number] Date of Birth: 05-25-1926  Today's Date: 03/21/2017 Time: SLP Start Time (ACUTE ONLY): 5053 SLP Stop Time (ACUTE ONLY): 0824 SLP Time Calculation (min) (ACUTE ONLY): 10 min  Past Medical History:  Past Medical History:  Diagnosis Date  . Anemia   . Arthritis   . Atrial fibrillation ([place])   . Breast cancer ([place]) 2012   right  . CHF (congestive heart failure) ([place])   . Cholelithiasis   . DCIS (ductal carcinoma in situ) of breast 07/15/2010   DCIS s/p lumpectomy and radiation 2011  . Diabetes mellitus   . DVT (deep venous thrombosis) ([place])   . GERD (gastroesophageal reflux disease)   . GI bleed   . Heart murmur   . Hyperlipidemia   . Hypertension   . Peripheral neuropathy   . Shortness of breath dyspnea   . Syncope 05/09/2015   Past Surgical History:  Past Surgical History:  Procedure Laterality Date  . BREAST SURGERY    . CARDIAC CATHETERIZATION N/A 05/11/2015   Procedure: Left Heart Cath and Coronary Angiography;  Surgeon: [name], MD;  Location: [place] CV LAB;  Service: Cardiovascular;  Laterality: N/A;  . CARDIAC CATHETERIZATION N/A 08/09/2015   Procedure: Right Heart Cath;  Surgeon: [name], MD;  Location: [place] [facility];  Service: Cardiovascular;  Laterality: N/A;  . CATARACT EXTRACTION Left 05/08/2015  . ENDOSCOPIC RETROGRADE CHOLANGIOPANCREATOGRAPHY (ERCP) WITH PROPOFOL  10/14/2012   Procedure: ENDOSCOPIC RETROGRADE CHOLANGIOPANCREATOGRAPHY (ERCP) WITH PROPOFOL;  Surgeon: Milus Banister, MD;  Location: [facility];  Service: Endoscopy;  Laterality: N/A;  . ESOPHAGOGASTRODUODENOSCOPY  09/01/2012   Procedure: ESOPHAGOGASTRODUODENOSCOPY (EGD);  Surgeon: Ladene Artist, MD,FACG;  Location: [facility] END[facility];  Service: Endoscopy;  Laterality: N/A;  . ESOPHAGOGASTRODUODENOSCOPY  09/03/2012   Procedure: ESOPHAGOGASTRODUODENOSCOPY (EGD);  Surgeon: Inda Castle, MD;   Location: [place];  Service: Endoscopy;  Laterality: N/A;  . ESOPHAGOGASTRODUODENOSCOPY N/A 04/05/2017   Procedure: ESOPHAGOGASTRODUODENOSCOPY (EGD);  Surgeon: Milus Banister, MD;  Location: [place];  Service: Endoscopy;  Laterality: N/A;  . EUS  10/14/2012   Procedure: UPPER ENDOSCOPIC ULTRASOUND (EUS) LINEAR;  Surgeon: Milus Banister, MD;  Location: [facility]OS[facility];  Service: Endoscopy;  Laterality: N/A;  . EUS  11/11/2012   Procedure: UPPER ENDOSCOPIC ULTRASOUND (EUS) LINEAR;  Surgeon: Milus Banister, MD;  Location: [facility]OS[facility];  Service: Endoscopy;  Laterality: N/A;   HPI:  83 y/o F (resides at [facility] PMH of Anemia, arthritis, A.Fib (on coumadin), CHF, MVR, HLD, oxygen dependent on 3L [place], CKD stage 4, GERD, HTN, HLD, and Breast Cancer, and duodenal mass. Presents to ED on 4/12 after being found by daughter in bed with bright red blood on sheets. Upon arrival to ED was hypotensive, tachycardic, with frank red blood "pouring from rectum". GI consulted and performed EGD 4/12 which was without acute bleeding or apparent source of bleeding. Duodenal mass still present. Intubated 4/12-4/13. Seen by ST during prior admission 09/01/15 for bedside swallow evaluation, findings of functional oropharyngeal swallow with mildly prolonged but adequate oral preparation of POs, brisk swallow response, no overt s/sx of aspiration. Dys 3, thin liquids was recommended with no further f/u.    Assessment / Plan / Recommendation Clinical Impression  Patient presents with oropharyngeal swallow which appears grossly within functional limits with adequate airway protection at bedside. Voice is initially hoarse, however pt reports it returned to baseline as PO trials commenced. Volitional cough is  strong. No overt signs of aspiration observed despite challenging with multiple consecutive boluses of thin liquid via straw in excess of 3oz. Vital signs remained stable. Oral preparation mildly prolonged,  but functional with regular solids due to dentition. Pt's baseline diet is softer foods. Recommend dys 3 diet with thin liquids, will defer diet order pending clearance by GI. Pt remains at mild risk for aspiration given history of GERD, GI history. No further f/u with SLP recommended at this time.  SLP Visit Diagnosis: Dysphagia, oropharyngeal phase (R13.12)    Aspiration Risk  Mild aspiration risk    Diet Recommendation Thin liquid;Dysphagia 3 (Mech soft)   Liquid Administration via: Cup;Straw Medication Administration: Whole meds with liquid Supervision: Patient able to self feed Compensations: Slow rate;Small sips/bites Postural Changes: Seated upright at 90 degrees    Other  Recommendations Oral Care Recommendations: Oral care BID   Follow up Recommendations None      Frequency and Duration            Prognosis Prognosis for Safe Diet Advancement: Good      Swallow Study   General Date of Onset: 03/18/2017 HPI: 83 y/o F (resides at [facility] PMH of Anemia, arthritis, A.Fib (on coumadin), CHF, MVR, HLD, oxygen dependent on 3L [place], CKD stage 4, GERD, HTN, HLD, and Breast Cancer, and duodenal mass. Presents to ED on 4/12 after being found by daughter in bed with bright red blood on sheets. Upon arrival to ED was hypotensive, tachycardic, with frank red blood "pouring from rectum". GI consulted and performed EGD 4/12 which was without acute bleeding or apparent source of bleeding. Duodenal mass still present. Intubated 4/12-4/13. Seen by ST during prior admission 09/01/15 for bedside swallow evaluation, findings of functional oropharyngeal swallow with mildly prolonged but adequate oral preparation of POs, brisk swallow response, no overt s/sx of aspiration. Dys 3, thin liquids was recommended with no further f/u.  Type of Study: Bedside Swallow Evaluation Previous Swallow Assessment: see HPI Diet Prior to this Study: NPO Temperature Spikes Noted: No Respiratory  Status: Nasal cannula History of Recent Intubation: Yes Length of Intubations (days): 2 days Date extubated: 03/20/17 Behavior/Cognition: Alert;Cooperative Oral Cavity Assessment: Within Functional Limits Oral Care Completed by SLP: Recent completion by staff Oral Cavity - Dentition: Missing dentition Vision: Functional for self-feeding Self-Feeding Abilities: Able to feed self Patient Positioning: Upright in bed Baseline Vocal Quality: Hoarse Volitional Cough: Strong Volitional Swallow: Able to elicit    Oral/Motor/Sensory Function Overall Oral Motor/Sensory Function: Within functional limits   Ice Chips Ice chips: Within functional limits Presentation: Spoon   Thin Liquid Thin Liquid: Within functional limits Presentation: Cup;Self Fed;Straw    Nectar Thick Nectar Thick Liquid: Not tested   Honey Thick Honey Thick Liquid: Not tested   Puree Puree: Within functional limits Presentation: Self Fed;Spoon   Solid   GO   Solid: Within functional limits Presentation: Self Ethelle Lyon 03/21/2017,8:39 AM  Deneise Lever, [place] CF-SLP Speech-Language Pathologist [phone]

## 2017-03-21 NOTE — Progress Notes (Signed)
Pt returned on the unit. Levo and Fentanyl titrating. Calm, cooperative. Large red blood BM was cleaned upon arrival. Family updated,  at the bedside, blood  consent taken.CBC sent. Waiting on results. Continue to monitor

## 2017-03-21 NOTE — Progress Notes (Signed)
CROSS COVER [facility]-GI Subjective: Patient seen and examined chart reviewed patient had ongoing lower GI bleeding also today requiring a nuclear medicine tag scan and a CT angiogram but definite source of bleeding could not be defined. On a recent endoscopy done on 03/08/2017 by Dr. Oretha Caprice no fresh or old heme was noted in the upper GI tract was small duodenal mass was defined but this has been noted on a previous endoscopy in the past. CTA revealed no definite source of GI bleeding some nodular cirrhotic changes in the liver. Patient has been intubated due to hemodynamic instability. Plans earlier today were to do a colonoscopy at the bedside but due to her ongoing rapid GI blood loss of the above measures undertaken.  Objective: Vital signs in last 24 hours: Temp:  [97.4 F (36.3 C)-98.5 F (36.9 C)] 97.8 F (36.6 C) (04/14 0738) Pulse Rate:  [82-143] 104 (04/14 0800) Resp:  [14-29] 19 (04/14 0800) BP: (70-124)/(45-111) 110/67 (04/14 0800) SpO2:  [91 %-100 %] 100 % (04/14 0800) FiO2 (%):  [40 %] 40 % (04/13 1200) Weight:  [109 kg (240 lb 4.8 oz)] 109 kg (240 lb 4.8 oz) (04/14 0500) Last BM Date: 03/20/17  Intake/Output from previous day: 04/13 0701 - 04/14 0700 In: 4459.5 [I.V.:3684.5; IV Piggyback:775] Out: 4098 [Urine:1665] Intake/Output this shift: No intake/output data recorded.  General appearance: alert, cooperative, appears stated age, mild distress, morbidly obese and pale Resp: clear to auscultation bilaterally Cardio: irregular rate and rhythm, S1, S2 normal, no murmur, click, rub or gallop GI: soft, non-tender; bowel sounds normal; no masses,  no organomegaly  Lab Results:  Recent Labs  03/31/2017 1438  03/20/17 0601  03/20/17 1830 03/21/17 0000 03/21/17 0445  WBC 12.6*  --  16.0*  --   --   --   --   HGB 9.3*  < > 9.9*  < > 8.5* 8.2* 7.8*  HCT 29.0*  < > 30.2*  < > 26.8* 24.9* 24.4*  PLT 141*  --  115*  --   --   --   --   < > = values in this interval not  displayed. BMET  Recent Labs  03/20/17 0601 03/20/17 1245 03/21/17 0445  NA 141 143 145  K 3.3* 3.2* 3.3*  CL 108 109 111  CO2 26 27 26   GLUCOSE 152* 105* 91  BUN 31* 27* 17  CREATININE 1.60* 1.56* 1.32*  CALCIUM 6.9* 7.2* 6.8*   LFT  Recent Labs  03/21/17 0445  PROT 4.5*  ALBUMIN 1.9*  AST 16  ALT 8*  ALKPHOS 62  BILITOT 0.7   PT/INR  Recent Labs  03/20/17 1830 03/21/17 0446  LABPROT 15.7* 16.4*  INR 1.24 1.31   Studies/Results: Dg Chest Port 1 View  Result Date: 03/20/2017 CLINICAL DATA:  Ventilator dependent. EXAM: PORTABLE CHEST 1 VIEW COMPARISON:  Radiograph of March 19, 2017. FINDINGS: Endotracheal and nasogastric tubes are in grossly good position. Atherosclerosis of thoracic aorta is noted. Stable cardiomegaly. No pneumothorax or significant pleural effusion is noted. Mild bibasilar atelectasis or infiltrates are noted. Bony thorax is unremarkable. IMPRESSION: Stable support apparatus. Aortic atherosclerosis. Mild bibasilar subsegmental atelectasis or infiltrates. Electronically Signed   By: Marijo Conception, M.D.   On: 03/20/2017 07:18   Dg Chest Port 1 View  Result Date: 03/16/2017 CLINICAL DATA:  Encounter for intubation an orogastric tube placement. EXAM: PORTABLE CHEST 1 VIEW COMPARISON:  03/08/2017. FINDINGS: Enlarged cardiomediastinal silhouette. ET tube has been inserted, and lies 2.9 cm  above the carina. Thoracic atherosclerosis. LEFT pleural effusion is redemonstrated. Pulmonary vascular congestion is observed in the lung bases. Pilar Plate pulmonary edema or consolidation is not established. IMPRESSION: ET tube 2.9 cm above the carina.  Stable aeration.  Cardiomegaly. Electronically Signed   By: Staci Righter M.D.   On: 04/04/2017 16:18   Dg Abd Portable 1 View  Result Date: 03/24/2017 CLINICAL DATA:  Orogastric tube placement EXAM: PORTABLE ABDOMEN - 1 VIEW COMPARISON:  03/22/2017 FINDINGS: The bowel gas pattern is normal. An orogastric tube appears to  have been pulled back into the left upper quadrant into the expected location of the body of the stomach since prior exam and is no longer projecting over the left lower quadrant. No radio-opaque calculi or other significant radiographic abnormality are seen. IMPRESSION: Repositioned orogastric tube into the left upper quadrant of the abdomen along the expected location of the proximal body of the stomach. Electronically Signed   By: Ashley Royalty M.D.   On: 03/18/2017 17:38   Dg Abd Portable 1 View  Result Date: 03/12/2017 CLINICAL DATA:  Encounter for orogastric tube placement. EXAM: PORTABLE ABDOMEN - 1 VIEW COMPARISON:  08/28/2016. FINDINGS: Orogastric tube has been inserted and lies with its tip along the greater curvature of the stomach. Gas pattern is nonobstructive. IMPRESSION: Satisfactory OG tube placement. Electronically Signed   By: Staci Righter M.D.   On: 03/18/2017 16:19   Medications: I have reviewed the patient's current medications.  Assessment/Plan: 1) Severe hemorrhagic shock with hemodynamic instability-as per my discussion with the patient's nurse a CODE STATUS needs to be addressed in this patient ASAP. Continue supportive care for now.   LOS: 2 days   [name] 03/21/2017, 10:00 AM

## 2017-03-22 ENCOUNTER — Inpatient Hospital Stay (HOSPITAL_COMMUNITY): Payer: Medicare HMO

## 2017-03-22 LAB — GLUCOSE, CAPILLARY
GLUCOSE-CAPILLARY: 217 mg/dL — AB (ref 65–99)
GLUCOSE-CAPILLARY: 207 mg/dL — AB (ref 65–99)
GLUCOSE-CAPILLARY: 151 mg/dL — AB (ref 65–99)
Glucose-Capillary: 183 mg/dL — ABNORMAL HIGH (ref 65–99)
Glucose-Capillary: 138 mg/dL — ABNORMAL HIGH (ref 65–99)
Glucose-Capillary: 102 mg/dL — ABNORMAL HIGH (ref 65–99)

## 2017-03-22 LAB — BASIC METABOLIC PANEL
Anion gap: 10 (ref 5–15)
BUN: 20 mg/dL (ref 6–20)
CO2: 20 mmol/L — ABNORMAL LOW (ref 22–32)
CREATININE: 1.74 mg/dL — AB (ref 0.44–1.00)
Calcium: 6.9 mg/dL — ABNORMAL LOW (ref 8.9–10.3)
Chloride: 110 mmol/L (ref 101–111)
GFR calc Af Amer: 29 mL/min — ABNORMAL LOW (ref >60)
GFR, EST NON AFRICAN AMERICAN: 25 mL/min — AB (ref >60)
GLUCOSE: 269 mg/dL — AB (ref 65–99)
POTASSIUM: 3.9 mmol/L (ref 3.5–5.1)
Sodium: 140 mmol/L (ref 135–145)

## 2017-03-22 LAB — HEMOGLOBIN AND HEMATOCRIT, BLOOD
HCT: 19.9 % — ABNORMAL LOW (ref 36.0–46.0)
HCT: 25.5 % — ABNORMAL LOW (ref 36.0–46.0)
HEMATOCRIT: 25.6 % — AB (ref 36.0–46.0)
HEMOGLOBIN: 6.8 g/dL — AB (ref 12.0–15.0)
Hemoglobin: 8.7 g/dL — ABNORMAL LOW (ref 12.0–15.0)
Hemoglobin: 8.7 g/dL — ABNORMAL LOW (ref 12.0–15.0)

## 2017-03-22 LAB — PREPARE FRESH FROZEN PLASMA
Unit division: 0
Unit division: 0

## 2017-03-22 LAB — CBC
HEMATOCRIT: 23.2 % — AB (ref 36.0–46.0)
Hemoglobin: 7.7 g/dL — ABNORMAL LOW (ref 12.0–15.0)
MCH: 28.5 pg (ref 26.0–34.0)
MCHC: 33.2 g/dL (ref 30.0–36.0)
MCV: 85.9 fL (ref 78.0–100.0)
PLATELETS: 78 10*3/uL — AB (ref 150–400)
RBC: 2.7 MIL/uL — ABNORMAL LOW (ref 3.87–5.11)
RDW: 15.4 % (ref 11.5–15.5)
WBC: 17.1 10*3/uL — ABNORMAL HIGH (ref 4.0–10.5)

## 2017-03-22 LAB — BPAM FFP
BLOOD PRODUCT EXPIRATION DATE: 201804162359
Blood Product Expiration Date: 201804162359
ISSUE DATE / TIME: 201804141300
ISSUE DATE / TIME: 201804141300
UNIT TYPE AND RH: 6200
Unit Type and Rh: 6200

## 2017-03-22 LAB — URINE CULTURE: Culture: NO GROWTH

## 2017-03-22 LAB — TYPE AND SCREEN
ABO/RH(D): O POS
Antibody Screen: NEGATIVE

## 2017-03-22 LAB — PROTIME-INR
INR: 1.72
PROTHROMBIN TIME: 20.4 s — AB (ref 11.4–15.2)

## 2017-03-22 LAB — PREPARE RBC (CROSSMATCH)

## 2017-03-22 LAB — MAGNESIUM: Magnesium: 1.6 mg/dL — ABNORMAL LOW (ref 1.7–2.4)

## 2017-03-22 MED ORDER — AMIODARONE HCL IN DEXTROSE 360-4.14 MG/200ML-% IV SOLN
30.0000 mg/h | INTRAVENOUS | Status: DC
  Administered 2017-03-22 – 2017-03-23 (×2): 30 mg/h via INTRAVENOUS
  Filled 2017-03-22 (×8): qty 200

## 2017-03-22 MED ORDER — DEXTROSE 5 % IV SOLN
1.0000 g | Freq: Every day | INTRAVENOUS | Status: DC
  Administered 2017-03-22: 1 g via INTRAVENOUS
  Filled 2017-03-22 (×2): qty 10

## 2017-03-22 MED ORDER — SODIUM CHLORIDE 0.9 % IV SOLN
Freq: Once | INTRAVENOUS | Status: AC
  Administered 2017-03-22: 09:00:00 via INTRAVENOUS

## 2017-03-22 MED ORDER — MAGNESIUM SULFATE 2 GM/50ML IV SOLN
2.0000 g | Freq: Once | INTRAVENOUS | Status: AC
  Administered 2017-03-22: 2 g via INTRAVENOUS
  Filled 2017-03-22: qty 50

## 2017-03-22 MED ORDER — DIPHENHYDRAMINE HCL 50 MG/ML IJ SOLN
25.0000 mg | Freq: Once | INTRAMUSCULAR | Status: AC
  Administered 2017-03-22: 25 mg via INTRAVENOUS
  Filled 2017-03-22: qty 1

## 2017-03-22 MED ORDER — CETAPHIL MOISTURIZING EX LOTN
TOPICAL_LOTION | Freq: Every day | CUTANEOUS | Status: DC
  Administered 2017-03-22 – 2017-03-23 (×2): via TOPICAL
  Filled 2017-03-22 (×2): qty 473

## 2017-03-22 MED ORDER — AMIODARONE LOAD VIA INFUSION
150.0000 mg | Freq: Once | INTRAVENOUS | Status: AC
  Administered 2017-03-22: 150 mg via INTRAVENOUS
  Filled 2017-03-22: qty 83.34

## 2017-03-22 MED ORDER — POTASSIUM CHLORIDE 2 MEQ/ML IV SOLN
30.0000 meq | Freq: Once | INTRAVENOUS | Status: AC
Start: 2017-03-22 — End: 2017-03-22
  Administered 2017-03-22: 30 meq via INTRAVENOUS
  Filled 2017-03-22: qty 15

## 2017-03-22 MED ORDER — AMIODARONE HCL IN DEXTROSE 360-4.14 MG/200ML-% IV SOLN
60.0000 mg/h | INTRAVENOUS | Status: AC
  Administered 2017-03-22 (×2): 60 mg/h via INTRAVENOUS
  Filled 2017-03-22: qty 200

## 2017-03-22 MED ORDER — POTASSIUM CHLORIDE 20 MEQ/15ML (10%) PO SOLN
20.0000 meq | Freq: Once | ORAL | Status: DC
  Filled 2017-03-22: qty 15

## 2017-03-22 NOTE — Progress Notes (Signed)
[place] Progress Note Patient Name: [name] DOB: 08-19-1926 MRN: [id number]   Date of Service  03/22/2017  HPI/Events of Note  AFIB with RVR - Ventricular rate = 120's to 150's. Currently on a Norepinephrine IV infusion at 37 mcg/min.   eICU Interventions  Will order: 1. Monitor CVP. 2. H/H and Mg++ level at 2 AM. 3. Send AM BMP at 2 AM. 4. Wean Norepinephrine IV infusion as tolerated.   If CVP normal or high, might require Amiodarone IV infusion for rate control given BP that goes up and down.      Intervention Category Major Interventions: Arrhythmia - evaluation and management  [place]mer,Steven Eugene 03/22/2017, 12:57 AM

## 2017-03-22 NOTE — Progress Notes (Signed)
[place] Progress Note Patient Name: [name] DOB: Apr 18, 1926 MRN: [id number]   Date of Service  03/22/2017  HPI/Events of Note  K+ = 3.9, Mg++ = 1.6 and Creatinine = 1.74.  eICU Interventions  Will order: 1. Replace Mg++. 2. Cautiously replace K+.     Intervention Category Major Interventions: Electrolyte abnormality - evaluation and management  Ellaina Schuler Eugene 03/22/2017, 3:59 AM

## 2017-03-22 NOTE — Progress Notes (Signed)
Cross cover [facility] Subjective: Since I last evaluated the patient, her overall situation seems to have worsened unfortunately the seat CTA and CT angiography and nuclear medicine scan was unrevealing. Patient has required pressors for the last 24 hours. Her family has made her limited code .   Objective: Vital signs in last 24 hours: Temp:  [97.3 F (36.3 C)-98.9 F (37.2 C)] 97.9 F (36.6 C) (04/15 0945) Pulse Rate:  [27-159] 87 (04/15 1100) Resp:  [0-35] 0 (04/15 1100) BP: (63-145)/(23-98) 83/58 (04/15 1100) SpO2:  [67 %-100 %] 100 % (04/15 1100) FiO2 (%):  [40 %-100 %] 40 % (04/15 0928) Weight:  [112.4 kg (247 lb 12.8 oz)] 112.4 kg (247 lb 12.8 oz) (04/15 0243) Last BM Date: 03/22/17 (per RN night shift)  Intake/Output from previous day: 04/14 0701 - 04/15 0700 In: 4136 [I.V.:2312.2; Blood:1115.6; IV Piggyback:708.3] Out: 880 [Urine:880] Intake/Output this shift: Total I/O In: 310.5 [I.V.:310.5] Out: 55 [Urine:55]  General appearance: morbidly obese black female, intubated and sedated Resp: clear to auscultation bilaterally Cardio: regular rate and rhythm, S1, S2 normal, no murmur, click, rub or gallop GI: soft, non-tender; bowel sounds normal; no masses,  no organomegaly  Lab Results:  Recent Labs  03/20/17 0601  03/21/17 1844 03/21/17 2244 03/22/17 0215 03/22/17 0800  WBC 16.0*  --  16.5*  --  17.1*  --   HGB 9.9*  < > 7.9* 7.7* 7.7* 6.8*  HCT 30.2*  < > 23.9* 23.3* 23.2* 19.9*  PLT 115*  --  74*  --  78*  --   < > = values in this interval not displayed. BMET  Recent Labs  03/20/17 1245 03/21/17 0445 03/22/17 0215  NA 143 145 140  K 3.2* 3.3* 3.9  CL 109 111 110  CO2 27 26 20*  GLUCOSE 105* 91 269*  BUN 27* 17 20  CREATININE 1.56* 1.32* 1.74*  CALCIUM 7.2* 6.8* 6.9*   LFT  Recent Labs  03/21/17 0445  PROT 4.5*  ALBUMIN 1.9*  AST 16  ALT 8*  ALKPHOS 62  BILITOT 0.7   PT/INR  Recent Labs  03/21/17 1118 03/22/17 0740  LABPROT 17.2*  20.4*  INR 1.39 1.72   Studies/Results: Ct Angio Abdomen W &/or Wo Contrast  Result Date: 03/21/2017 CLINICAL DATA:  Acute lower GI bleed. History of nodule within the duodenum. EXAM: CTA ABDOMEN AND PELVIS WITH CONTRAST TECHNIQUE: Multidetector CT imaging of the abdomen and pelvis was performed using the standard protocol during bolus administration of intravenous contrast. Multiplanar reconstructed images and MIPs were obtained and reviewed to evaluate the vascular anatomy. CONTRAST:  100 cc Isovue 370 COMPARISON:  CT abdomen pelvis - 09/02/2012 FINDINGS: VASCULAR Aorta: There is a moderate amount of eccentric mixed calcified and noncalcified atherosclerotic plaque within a tortuous but normal caliber abdominal aorta, not resulting in hemodynamically significant stenosis. No abdominal aortic dissection or periaortic stranding. Celiac: There is a moderate amount of eccentric mixed calcified and noncalcified atherosclerotic plaque involving the origin the celiac artery resulting in focal least 50% luminal narrowing (axial image 54, series 401). There is a minimal amount of eccentric mixed calcified and noncalcified atherosclerotic plaque within the distal aspect of the celiac artery prior to the vessels bifurcation, also likely resulting in at least 50% luminal narrowing (image 54, series 401). Conventional branching pattern. SMA: There is a minimal to moderate amount of scattered eccentric mixed calcified and noncalcified atherosclerotic plaque throughout the main trunk of the SMA, not definitely resulting in a hemodynamically significant  stenosis, though note, the distal aspect of the SMA may appears slightly atretic. Conventional branching pattern. No definitive discrete intraluminal filling defect suggest distal embolism. Renals: Solitary bilaterally. There is a moderate amount of eccentric mixed calcified and noncalcified atherosclerotic plaque involving the proximal aspect of the left renal artery  (coronal image 40, series 406), likely resulting in 50% luminal narrowing. There is a minimal amount of eccentric mixed calcified and noncalcified atherosclerotic plaque involving the origin of the right renal artery, not definitely resulting in a hemodynamically significant stenosis. IMA: Highly diseased at its origin though remains patent. Inflow: The bilateral common and external iliac arteries are tortuous but of normal caliber and without hemodynamically significant stenosis. The bilateral internal iliac arteries are disease though patent and of normal caliber. Proximal Outflow: Minimal amount of atherosclerotic plaque within bilateral common femoral arteries, not resulting in hemodynamically significant stenosis. Veins: The IVC and pelvic venous system appears widely patent. Review of the MIP images confirms the above findings. NON-VASCULAR Lower chest: Limited visualization of the lower thorax is degraded secondary to patient respiratory artifact. Trace pleural effusions. Bibasilar subpleural reticular opacities, incompletely evaluated. Note is made of a punctate (approximately 0.8 cm) nodule within the image right lower lobe, similar to remote examination performed 08/2012. Cardiomegaly. Coronary artery calcifications. No pericardial effusion. Hepatobiliary: Nodularity of the hepatic contour. There is mild diffuse decreased attenuation of the hepatic parenchyma on this postcontrast examination suggestive of hepatic steatosis. No discrete hepatic lesions. Punctate (approximately 0.7 cm) gallstone within the fundus of an otherwise normal-appearing gallbladder. No intra extrahepatic biliary duct dilatation. No ascites. Pancreas: Normal appearance of the pancreas. Spleen: Normal appearance of the spleen. Adrenals/Urinary Tract: There is symmetric enhancement of the bilateral kidneys. No definite renal stones this postcontrast examination. Punctate (approximately 0.7 cm) hypoattenuating right-sided renal lesion  (coronal image 84, series 506) is too small to adequately characterize of favored to represent renal cysts. No discrete left-sided renal lesions. No urinary obstruction or perinephric stranding. Normal appearance of the bilateral adrenal glands. The urinary bladder is decompressed with a Foley catheter. There is diffuse thickening the urinary bladder wall, potentially accentuated due to underdistention. Stomach/Bowel: Suspected blood products are seen within the rectal vault with associated of minimal amount of adjacent perirectal mesenteric stranding. A discrete intraluminal filling defect is not seen within the duodenum. No discrete areas of intraluminal active extravasation are seen on this examination, though note, evaluation somewhat degraded secondary to poor cardiac output. The cecum is noted to be located within the right mid abdomen. Bowel is otherwise normal in course and caliber without additional area of wall thickening or enteric obstruction. No pneumoperitoneum, pneumatosis or portal venous gas. Lymphatic: No bulky retroperitoneal, mesenteric, pelvic or inguinal lymphadenopathy. Reproductive: Normal appearance of the pelvic organs for age. No discrete adnexal lesion. Small amount of fluid seen with the pelvic cul-de-sac. Other: Small mesenteric fat containing periumbilical hernia. Musculoskeletal: No acute or aggressive osseous abnormalities. Stigmata of DISH within the caudal aspect of the thoracic spine. Degenerative change of the bilateral hips. Degenerative change of the pubic symphysis. IMPRESSION: VASCULAR 1. Marked cardiomegaly with poor cardiac output limiting arterial phase evaluation of the abdomen and pelvis. 2. No discrete areas of definitive intraluminal contrast extravasation to suggest the location of the patient's reported ongoing GI bleed. 3. Suspected hemodynamically significant narrowing involving the celiac and left renal artery. The IMA is heavily diseased though remains patent. 4.  Aortic Atherosclerosis (ICD10-170.0) NON-VASCULAR 1. Suspected evolving blood products within the rectal vault with associated minimal  amount of perirectal mesenteric stranding. No discrete areas of definitive intraluminal contrast extravasation to suggest the location of the patient's reported ongoing GI bleed. 2. A discrete intraluminal filling defect is not seen within the duodenum. 3. Nodularity hepatic contour potentially indicative of cirrhotic change. Correlation LFTs is recommended. Above findings discussed with Dr. Lake Bells at the time of procedure completion. Patient with subsequently transferred to undergo a nuclear medicine tagged red blood cell study. Electronically Signed   By: Sandi Mariscal M.D.   On: 03/21/2017 16:10   Nm Gi Blood Loss  Result Date: 03/21/2017 CLINICAL DATA:  Inpatient. Lower GI bleed. Passage of rectal blood clots. EXAM: NUCLEAR MEDICINE GASTROINTESTINAL BLEEDING SCAN TECHNIQUE: Sequential abdominal images were obtained following intravenous administration of Tc-62m labeled red blood cells. RADIOPHARMACEUTICALS:  21.9 mCi Tc-5m in-vitro labeled red cells. COMPARISON:  03/21/2017 CT angiogram of the abdomen/pelvis. FINDINGS: No evidence of active GI bleed during the 2 hours of scintigraphic imaging. Linear activity projecting over the patient's right thigh correlates with Foley catheter activity. IMPRESSION: No scintigraphic evidence of active GI bleed. Electronically Signed   By: Ilona Sorrel M.D.   On: 03/21/2017 18:42   Portable Chest Xray  Result Date: 03/22/2017 CLINICAL DATA:  Acute on chronic respiratory failure, with hypoxemia. Initial encounter. EXAM: PORTABLE CHEST 1 VIEW COMPARISON:  Chest radiograph performed 03/21/2017 FINDINGS: The patient's endotracheal tube is seen ending 3 cm above the carina. A right IJ line is noted ending about the mid to distal SVC. The lungs are well-aerated. Vascular congestion is noted. Bibasilar airspace opacities raise concern for mild  interstitial edema, increased from the prior study. There is no evidence of significant pleural effusion or pneumothorax. The cardiomediastinal silhouette is borderline enlarged. No acute osseous abnormalities are seen. IMPRESSION: 1. Endotracheal tube seen ending 3 cm above the carina. 2. Vascular congestion and borderline cardiomegaly. Bibasilar airspace opacities raise concern for mild interstitial edema, increased from the prior study. Electronically Signed   By: Garald Balding M.D.   On: 03/22/2017 06:20   Dg Chest Portable 1 View  Result Date: 03/21/2017 CLINICAL DATA:  Intubated EXAM: PORTABLE CHEST 1 VIEW COMPARISON:  Chest radiograph from one day prior. FINDINGS: Endotracheal tube tip is 2.3 cm above the carina. Right internal jugular central venous catheter terminates at the cavoatrial junction. Stable cardiomediastinal silhouette with mild cardiomegaly and aortic atherosclerosis. No appreciable pneumothorax, noting exclusion of portions of the lung apices from the views. Possible stable trace bilateral pleural effusions. No overt pulmonary edema. Stable hazy bibasilar lung opacities. IMPRESSION: 1. Support structures as detailed. 2. Stable mild cardiomegaly without overt pulmonary edema. 3. Possible stable trace bilateral pleural effusions. 4. Hazy bibasilar lung opacities, stable, favor scarring or atelectasis. Electronically Signed   By: Ilona Sorrel M.D.   On: 03/21/2017 14:49   Medications: I have reviewed the patient's current medications.  Assessment/Plan: Rectal bleeding-source of bleeding has still not been localized in spite of multiple tests done yesterday. As per my discussion with the patient's nurse her response to aggressive treatment for the restaurant 24 hours were remarkable for the family makes further decisions to limit her care. There is not much from a GI standpoint that can be done at this time please call if further assistance is needed.   LOS: 3 days    Elliot Meldrum 03/22/2017, 11:19 AM

## 2017-03-22 NOTE — Progress Notes (Addendum)
PULMONARY / CRITICAL CARE MEDICINE   Name: [name] MRN: [id number] DOB: 26-Feb-1926    ADMISSION DATE:  03/31/2017 CONSULTATION DATE:  04/03/2017  REFERRING MD:  Dr. Baird Cancer   CHIEF COMPLAINT:  Acute GI Bleed   HISTORY OF PRESENT ILLNESS:   83 y/o F (resides at [facility]) with PMH of Anemia, arthritis, A.Fib (on coumadin), CHF, MVR, HLD, oxygen dependent on 3L [place], CKD stage 4, GERD, HTN, HLD, and Breast Cancer, and duodenal mass. Presents to ED on 4/12 after being found by daughter in bed with bright red blood on sheets. Upon arrival to ED was hypotensive, tachycardic, with frank red blood "pouring from rectum". In ED patient received 2L NS and 4 units PRBC, Kcentra, FFP, IV vitamin K for coumadin reversal. Started on IV Protonix ggt. GI consulted and performed EGD 4/12 which was without acute bleeding or apparent source of bleeding. Duodenal mass still present. PCCM to admit. Intubated 4/12 > 4/13, 4/14 -->   Of note patient admitted 4/1-4/4 for syncope secondary to orthostatic HTN and recent falls, diagnosised with E.Coli UTI 4/1 and treated with Rocephin. Contacted SNF, patient has not been on Abx since 4/5.   Interval History: Extubated 4/13 however re-intubated 4/14. Several large volume bloody BMs yesterday associated with hypotension and AFib with RVR. Transfused 2 units PRBC + 2 units FFP and CTA/"red cell' scan performed which did not show active bleeding.  Remains in afib with RVR, requiring Amio bolus + infusion ~ 6am.   Fent, NE (30), Amio, Protonix Patient appears quite rigid on examination with some ratcheting. This echoed by RN who noted the difference in her neuro status.   STUDIES:  CXR 4/12 - Satisfactory OG tube. Stable cardiomegaly.  EGD 4/12 - No blood in UGI. Duodenal mass still present, appears unchanged CT angio pelvis 4/14: Suspected HD sign narrowing of celiac and left renal arteries. Evolving blood products in rectal vault with minimal perirectal  stranding. No discrete areas of contrast extravasation.  NM blood loss 4/14: No evidence of active GI bleed CXR 4/15 - Vascular congestion, mild bibasilar airspace opacities, mild interstitial edema  CULTURES: Ucx 4/1 > E.coli, RESISTANT to Bactrim Ucx 4/14 >  ANTIBIOTICS: Bactrim 4/14 > 4/15 Ceftriaxone 4/15 >>  SIGNIFICANT EVENTS: 4/12 > Presents to ED with GI bleed. Transfused 4 units 4/12 EGD without acute blood loss 4/13 continues to have BRBPR 4/13 Extubated 4/14 Intubated. Lg volume BRBPR req pressors and re-intubation. NM scan without source of acute bleeding. Transfused 2 units PRB + 2 units FFP  LINES/TUBES: ETT 4/12 >> 4/13, 4/14 >> Right IJ Cortis 4/12 >>  VITAL SIGNS: BP 131/83   Pulse (!) 135   Temp 98 F (36.7 C) (Oral)   Resp (!) 35   Ht 5\' 4"  (1.626 m)   Wt 247 lb 12.8 oz (112.4 kg)   SpO2 100%   BMI 42.53 kg/m   VENTILATOR SETTINGS: Vent Mode: PRVC FiO2 (%):  [40 %-100 %] 40 % Set Rate:  [20 bmp] 20 bmp Vt Set:  [530 mL] 530 mL PEEP:  [5 cmH20] 5 cmH20 Plateau Pressure:  [26 cmH20-33 cmH20] 33 cmH20  INTAKE / OUTPUT: + 6.6 L since admission. + 2.5 L yest.  PHYSICAL EXAMINATION: General:  Chronically ill appearing, +ETT HENT: [place],AT. Pupils pinpoint but equal.  PULM: CTA BL with some coarse breath sounds CV: Irreg irreg, no mgr, rate ~120's GI: soft, nontender MSK: Extremities rigid, ?cogwheeling in BL UE?  Neuro: Sedated, does not  [facility]  BMET  Recent Labs Lab 03/20/17 1245 03/21/17 0445 03/22/17 0215  NA 143 145 140  K 3.2* 3.3* 3.9  CL 109 111 110  CO2 27 26 20*  BUN 27* 17 20  CREATININE 1.56* 1.32* 1.74*  GLUCOSE 105* 91 269*   Electrolytes  Recent Labs Lab 03/20/17 0601 03/20/17 1245 03/21/17 0445 03/22/17 0215  CALCIUM 6.9* 7.2* 6.8* 6.9*  MG 1.9  --   --  1.6*  PHOS 1.8*  --   --   --    CBC  Recent Labs Lab 03/20/17 0601  03/21/17 1844 03/21/17 2244 03/22/17 0215  WBC 16.0*  --  16.5*  --  17.1*  HGB 9.9*   < > 7.9* 7.7* 7.7*  HCT 30.2*  < > 23.9* 23.3* 23.2*  PLT 115*  --  74*  --  78*  < > = values in this interval not displayed. Coag's  Recent Labs Lab 03/20/2017 1438  03/20/17 1830 03/21/17 0446 03/21/17 1118  APTT 29  --   --   --   --   INR 1.16  < > 1.24 1.31 1.39  < > = values in this interval not displayed. Sepsis Markers  Recent Labs Lab 03/31/2017 0030 03/16/2017 1448 03/18/2017 1720  LATICACIDVEN 1.6 3.12* 1.4   ABG  Recent Labs Lab 03/27/2017 1925 03/21/17 1850  PHART 7.409 7.379  PCO2ART 43.9 32.6  PO2ART 551.0* 488*   Liver Enzymes  Recent Labs Lab 03/20/17 1245 03/21/17 0445  AST 18 16  ALT 7* 8*  ALKPHOS 70 62  BILITOT 1.0 0.7  ALBUMIN 2.1* 1.9*    Cardiac Enzymes  Recent Labs Lab 03/15/2017 1720 03/20/17 0601  TROPONINI 0.03* 0.07*   Glucose  Recent Labs Lab 03/21/17 0350 03/21/17 0735 03/21/17 1141 03/21/17 2050 03/22/17 0106 03/22/17 0453  GLUCAP 81 79 106* 207* 217* 207*   Imaging Ct Angio Abdomen W &/or Wo Contrast  Result Date: 03/21/2017 CLINICAL DATA:  Acute lower GI bleed. History of nodule within the duodenum. EXAM: CTA ABDOMEN AND PELVIS WITH CONTRAST TECHNIQUE: Multidetector CT imaging of the abdomen and pelvis was performed using the standard protocol during bolus administration of intravenous contrast. Multiplanar reconstructed images and MIPs were obtained and reviewed to evaluate the vascular anatomy. CONTRAST:  100 cc Isovue 370 COMPARISON:  CT abdomen pelvis - 09/02/2012 FINDINGS: VASCULAR Aorta: There is a moderate amount of eccentric mixed calcified and noncalcified atherosclerotic plaque within a tortuous but normal caliber abdominal aorta, not resulting in hemodynamically significant stenosis. No abdominal aortic dissection or periaortic stranding. Celiac: There is a moderate amount of eccentric mixed calcified and noncalcified atherosclerotic plaque involving the origin the celiac artery resulting in focal least 50%  luminal narrowing (axial image 54, series 401). There is a minimal amount of eccentric mixed calcified and noncalcified atherosclerotic plaque within the distal aspect of the celiac artery prior to the vessels bifurcation, also likely resulting in at least 50% luminal narrowing (image 54, series 401). Conventional branching pattern. SMA: There is a minimal to moderate amount of scattered eccentric mixed calcified and noncalcified atherosclerotic plaque throughout the main trunk of the SMA, not definitely resulting in a hemodynamically significant stenosis, though note, the distal aspect of the SMA may appears slightly atretic. Conventional branching pattern. No definitive discrete intraluminal filling defect suggest distal embolism. Renals: Solitary bilaterally. There is a moderate amount of eccentric mixed calcified and noncalcified atherosclerotic plaque involving the proximal aspect of the left renal artery (coronal image 40,  series 406), likely resulting in 50% luminal narrowing. There is a minimal amount of eccentric mixed calcified and noncalcified atherosclerotic plaque involving the origin of the right renal artery, not definitely resulting in a hemodynamically significant stenosis. IMA: Highly diseased at its origin though remains patent. Inflow: The bilateral common and external iliac arteries are tortuous but of normal caliber and without hemodynamically significant stenosis. The bilateral internal iliac arteries are disease though patent and of normal caliber. Proximal Outflow: Minimal amount of atherosclerotic plaque within bilateral common femoral arteries, not resulting in hemodynamically significant stenosis. Veins: The IVC and pelvic venous system appears widely patent. Review of the MIP images confirms the above findings. NON-VASCULAR Lower chest: Limited visualization of the lower thorax is degraded secondary to patient respiratory artifact. Trace pleural effusions. Bibasilar subpleural reticular  opacities, incompletely evaluated. Note is made of a punctate (approximately 0.8 cm) nodule within the image right lower lobe, similar to remote examination performed 08/2012. Cardiomegaly. Coronary artery calcifications. No pericardial effusion. Hepatobiliary: Nodularity of the hepatic contour. There is mild diffuse decreased attenuation of the hepatic parenchyma on this postcontrast examination suggestive of hepatic steatosis. No discrete hepatic lesions. Punctate (approximately 0.7 cm) gallstone within the fundus of an otherwise normal-appearing gallbladder. No intra extrahepatic biliary duct dilatation. No ascites. Pancreas: Normal appearance of the pancreas. Spleen: Normal appearance of the spleen. Adrenals/Urinary Tract: There is symmetric enhancement of the bilateral kidneys. No definite renal stones this postcontrast examination. Punctate (approximately 0.7 cm) hypoattenuating right-sided renal lesion (coronal image 84, series 506) is too small to adequately characterize of favored to represent renal cysts. No discrete left-sided renal lesions. No urinary obstruction or perinephric stranding. Normal appearance of the bilateral adrenal glands. The urinary bladder is decompressed with a Foley catheter. There is diffuse thickening the urinary bladder wall, potentially accentuated due to underdistention. Stomach/Bowel: Suspected blood products are seen within the rectal vault with associated of minimal amount of adjacent perirectal mesenteric stranding. A discrete intraluminal filling defect is not seen within the duodenum. No discrete areas of intraluminal active extravasation are seen on this examination, though note, evaluation somewhat degraded secondary to poor cardiac output. The cecum is noted to be located within the right mid abdomen. Bowel is otherwise normal in course and caliber without additional area of wall thickening or enteric obstruction. No pneumoperitoneum, pneumatosis or portal venous gas.  Lymphatic: No bulky retroperitoneal, mesenteric, pelvic or inguinal lymphadenopathy. Reproductive: Normal appearance of the pelvic organs for age. No discrete adnexal lesion. Small amount of fluid seen with the pelvic cul-de-sac. Other: Small mesenteric fat containing periumbilical hernia. Musculoskeletal: No acute or aggressive osseous abnormalities. Stigmata of DISH within the caudal aspect of the thoracic spine. Degenerative change of the bilateral hips. Degenerative change of the pubic symphysis. IMPRESSION: VASCULAR 1. Marked cardiomegaly with poor cardiac output limiting arterial phase evaluation of the abdomen and pelvis. 2. No discrete areas of definitive intraluminal contrast extravasation to suggest the location of the patient's reported ongoing GI bleed. 3. Suspected hemodynamically significant narrowing involving the celiac and left renal artery. The IMA is heavily diseased though remains patent. 4. Aortic Atherosclerosis (ICD10-170.0) NON-VASCULAR 1. Suspected evolving blood products within the rectal vault with associated minimal amount of perirectal mesenteric stranding. No discrete areas of definitive intraluminal contrast extravasation to suggest the location of the patient's reported ongoing GI bleed. 2. A discrete intraluminal filling defect is not seen within the duodenum. 3. Nodularity hepatic contour potentially indicative of cirrhotic change. Correlation LFTs is recommended. Above findings discussed with Dr.  Signa Cheek at the time of procedure completion. Patient with subsequently transferred to undergo a nuclear medicine tagged red blood cell study. Electronically Signed   By: Sandi Mariscal M.D.   On: 03/21/2017 16:10   Nm Gi Blood Loss  Result Date: 03/21/2017 CLINICAL DATA:  Inpatient. Lower GI bleed. Passage of rectal blood clots. EXAM: NUCLEAR MEDICINE GASTROINTESTINAL BLEEDING SCAN TECHNIQUE: Sequential abdominal images were obtained following intravenous administration of Tc-6m labeled  red blood cells. RADIOPHARMACEUTICALS:  21.9 mCi Tc-65m in-vitro labeled red cells. COMPARISON:  03/21/2017 CT angiogram of the abdomen/pelvis. FINDINGS: No evidence of active GI bleed during the 2 hours of scintigraphic imaging. Linear activity projecting over the patient's right thigh correlates with Foley catheter activity. IMPRESSION: No scintigraphic evidence of active GI bleed. Electronically Signed   By: Ilona Sorrel M.D.   On: 03/21/2017 18:42   Dg Chest Portable 1 View  Result Date: 03/21/2017 CLINICAL DATA:  Intubated EXAM: PORTABLE CHEST 1 VIEW COMPARISON:  Chest radiograph from one day prior. FINDINGS: Endotracheal tube tip is 2.3 cm above the carina. Right internal jugular central venous catheter terminates at the cavoatrial junction. Stable cardiomediastinal silhouette with mild cardiomegaly and aortic atherosclerosis. No appreciable pneumothorax, noting exclusion of portions of the lung apices from the views. Possible stable trace bilateral pleural effusions. No overt pulmonary edema. Stable hazy bibasilar lung opacities. IMPRESSION: 1. Support structures as detailed. 2. Stable mild cardiomegaly without overt pulmonary edema. 3. Possible stable trace bilateral pleural effusions. 4. Hazy bibasilar lung opacities, stable, favor scarring or atelectasis. Electronically Signed   By: Ilona Sorrel M.D.   On: 03/21/2017 14:49   DISCUSSION: 83 year old female on coumadin with known duodenal mass presents with active GI bleed. Intubated in ED + s/p transfusion 4 units. EGD without active bleeding, did show stable duodenal mass without ulceration. No further bleeding as of 4/14.   ASSESSMENT / PLAN:  PULMONARY A: Acute on Chronic Hypoxic Respiratory Failure (on home 3L [place]). Reintubated 4/14 P:   Maintain O2 saturation > 90% Wean as tolerated  CARDIOVASCULAR A: Hemorrhagic Shock secondary to active GI bleed Demand ischemia H/O HLD, HTN,  A.fib, again with RVR overnight P:  Tele Amio bolus  + continuous (60 mg/hr) PRN [place] for sustained tachy >110  RENAL A:   Acute on chronic renal failure, Cr creeping back up. 1.74 (was 1.3 yest) P:   KVO fluids Monitor BMET and UOP Replace electrolytes as needed, Mg and K repleted today (2g IV mag, KCl 30 mEq)  GASTROINTESTINAL A:   GI Bleed> source uncertain. NM scan neg for source. CTA also without source of active bleed although did demonstrate evolving collection of blood products in rectum. Also with ?occlusion of celiac and left renal artery H/O GERD, Duodenal mass  P:   GI following NPO Protonix GTT Cbc q shift  HEMATOLOGIC A:   Hemorrhagic anemia. 7.7. P:  Transfuse for Hgb < 7gm/dL CBC Q shift  INFECTIOUS A:   UTI > persistent pyuria. Bactrim added yest but culture from 14 days prior showed resistance. --> IV Ceftriaxone. Ucx pend P:   Bactrim -> Ceftriaxone 4/15.  Ucx pend  ENDOCRINE A:   Hyperglycemia    P:   SSI Q4H glucose checks   NEUROLOGIC A:   Sedated  P:   Fent, Versed PRN  FAMILY  -Updates: updated family on 4/14. Fam at bedside currently.   Bethany Molt, DO   Attending:  I have seen and examined the patient with nurse practitioner/resident  and agree with the note above.  We formulated the plan together and I elicited the following history.    Bad bleeding yesterday but nuc med scan negative Intubated Itching this morning  On exam Itching, will wake up but not follow commjands Lungs clear on mech ventilation  GI bleeding> follow CBC, if re-bleeds then send for nuc med scan and IR if positive Itching> benadryl x1 UTI> ceftriaxone Shock> transfuse blood now AKI> give volume Anemia> transfusion goal Hgb > 7gm/dL Coagulopathy> FFP today  Discuss goals of care with family  My cc time 30 minutes  Roselie Awkward, MD [place] PCCM Pager: [phone] Cell: [phone] After 3pm or if no response, call [phone]

## 2017-03-22 NOTE — Progress Notes (Signed)
CRITICAL VALUE ALERT  Critical value received: Darden Amber, RN  Date of notification: 03/21/17  Time of notification:  0904  Critical value read back: yes  Nurse who received alert: Darden Amber, RN  MD notified (1st page):  Dr. Danford Bad  Time of first page:  [phone]  Responding MD:  Dr. Danford Bad  Time MD responded:  [phone]

## 2017-03-22 NOTE — Progress Notes (Signed)
Called elink Dr. Tamala Julian to updated Md on pt atrial fib teens to 140s, Dr. Collene Mares verbalized there will be no diagnostic procedure this evening. Will notify MD for HR sustained > 130 per MD. Pt family requesting nuclear med results, md updated family using this RNs ascom phone.

## 2017-03-22 NOTE — Progress Notes (Signed)
Called elink Dr Oletta Darter to inform pt atrial fib maintains in teens to 140s, md informed accuracy of bps in question d/t varying sbps (80s-130s). Will check cvp and h/h now per md order.

## 2017-03-22 NOTE — Progress Notes (Signed)
Upon assessment, pt is very ridgid with tremors, does NOT follow commands,  pinpoint pupils and favoring Rt side.Resident at the bedside, notified of  the huge difference from yesterday baseline.

## 2017-03-22 NOTE — Plan of Care (Signed)
[facility] PCCM  I met with the patient's extended family today to discuss goals of care.  We discussed the fact that [name] has had recurrent bleeding requiring life support measures each time and unfortunately we have been unable to discover the cause of bleeding.  We also discussed that further life support at age 83 with multiple medical problems contributes to her ongoing weakness and decreases her chances of survival with a good outcome.  They voiced understanding.  After discussion we decided: - no CPR or shocks in the event of a cardiac arrest - continue current level of care (blood transfusion, vasopressors, mechanical ventilation) for the next 24 hours - if re-bleeds, she will go to nuclear medicine and potentially IR  - however if no improvement or no definitive treatment for her bleeding by tomorrow we will withdraw care.    The family was unified in their decision around this.  Roselie Awkward, MD [place] PCCM Pager: [phone] Cell: [phone] After 3pm or if no response, call [phone]

## 2017-03-22 NOTE — Progress Notes (Signed)
eLink Physician-Brief Progress Note Patient Name: [name] DOB: [birth year] MRN: [id number]   Date of Service  03/22/2017  HPI/Events of Note  Remains in AFIB with RVR. Ventricular rate = 120's to 140's.  eICU Interventions  Will order: 1. Amiodarone IV bolus and infusion.      Intervention Category Major Interventions: Arrhythmia - evaluation and management  Sommer,Steven Eugene 03/22/2017, 5:49 AM

## 2017-03-23 ENCOUNTER — Inpatient Hospital Stay (HOSPITAL_COMMUNITY): Payer: Medicare HMO

## 2017-03-23 DIAGNOSIS — J9621 Acute and chronic respiratory failure with hypoxia: Secondary | ICD-10-CM

## 2017-03-23 DIAGNOSIS — Z01818 Encounter for other preprocedural examination: Secondary | ICD-10-CM

## 2017-03-23 LAB — BPAM RBC
BLOOD PRODUCT EXPIRATION DATE: 201805072359
BLOOD PRODUCT EXPIRATION DATE: 201805082359
BLOOD PRODUCT EXPIRATION DATE: 201805082359
Blood Product Expiration Date: 201805032359
Blood Product Expiration Date: 201805042359
Blood Product Expiration Date: 201805052359
Blood Product Expiration Date: 201805092359
Blood Product Expiration Date: 201805112359
ISSUE DATE / TIME: 201804121330
ISSUE DATE / TIME: 201804121330
ISSUE DATE / TIME: 201804121456
ISSUE DATE / TIME: 201804121456
ISSUE DATE / TIME: 201804141306
ISSUE DATE / TIME: 201804141306
ISSUE DATE / TIME: 201804150901
ISSUE DATE / TIME: 201804151159
UNIT TYPE AND RH: 5100
UNIT TYPE AND RH: 9500
UNIT TYPE AND RH: 9500
UNIT TYPE AND RH: 9500
Unit Type and Rh: 5100
Unit Type and Rh: 5100
Unit Type and Rh: 5100
Unit Type and Rh: 9500

## 2017-03-23 LAB — BLOOD GAS, ARTERIAL
Acid-base deficit: 5.4 mmol/L — ABNORMAL HIGH (ref 0.0–2.0)
Bicarbonate: 18.9 mmol/L — ABNORMAL LOW (ref 20.0–28.0)
Drawn by: 441371
FIO2: 100
MECHVT: 530 mL
O2 Saturation: 99.8 %
PATIENT TEMPERATURE: 97.5
PEEP/CPAP: 5 cmH2O
PH ART: 7.379 (ref 7.350–7.450)
PO2 ART: 488 mmHg — AB (ref 83.0–108.0)
RATE: 20 resp/min
pCO2 arterial: 32.6 mmHg (ref 32.0–48.0)

## 2017-03-23 LAB — TYPE AND SCREEN
ABO/RH(D): O POS
ANTIBODY SCREEN: NEGATIVE
UNIT DIVISION: 0
Unit division: 0
Unit division: 0
Unit division: 0
Unit division: 0
Unit division: 0
Unit division: 0
Unit division: 0

## 2017-03-23 LAB — RENAL FUNCTION PANEL
Albumin: 2.3 g/dL — ABNORMAL LOW (ref 3.5–5.0)
Anion gap: 11 (ref 5–15)
BUN: 19 mg/dL (ref 6–20)
CHLORIDE: 110 mmol/L (ref 101–111)
CO2: 20 mmol/L — AB (ref 22–32)
CREATININE: 1.89 mg/dL — AB (ref 0.44–1.00)
Calcium: 7.3 mg/dL — ABNORMAL LOW (ref 8.9–10.3)
GFR calc Af Amer: 26 mL/min — ABNORMAL LOW (ref >60)
GFR, EST NON AFRICAN AMERICAN: 23 mL/min — AB (ref >60)
GLUCOSE: 149 mg/dL — AB (ref 65–99)
Phosphorus: 2.9 mg/dL (ref 2.5–4.6)
Potassium: 3.9 mmol/L (ref 3.5–5.1)
Sodium: 141 mmol/L (ref 135–145)

## 2017-03-23 LAB — GLUCOSE, CAPILLARY
GLUCOSE-CAPILLARY: 106 mg/dL — AB (ref 65–99)
GLUCOSE-CAPILLARY: 118 mg/dL — AB (ref 65–99)
Glucose-Capillary: 113 mg/dL — ABNORMAL HIGH (ref 65–99)
Glucose-Capillary: 141 mg/dL — ABNORMAL HIGH (ref 65–99)

## 2017-03-23 LAB — CBC
HEMATOCRIT: 25.1 % — AB (ref 36.0–46.0)
HEMOGLOBIN: 8.8 g/dL — AB (ref 12.0–15.0)
MCH: 29.6 pg (ref 26.0–34.0)
MCHC: 35.1 g/dL (ref 30.0–36.0)
MCV: 84.5 fL (ref 78.0–100.0)
Platelets: 89 10*3/uL — ABNORMAL LOW (ref 150–400)
RBC: 2.97 MIL/uL — ABNORMAL LOW (ref 3.87–5.11)
RDW: 15.3 % (ref 11.5–15.5)
WBC: 11.7 10*3/uL — AB (ref 4.0–10.5)

## 2017-03-23 LAB — PREPARE FRESH FROZEN PLASMA: Unit division: 0

## 2017-03-23 LAB — BPAM FFP
BLOOD PRODUCT EXPIRATION DATE: 201804162359
ISSUE DATE / TIME: 201804151159
UNIT TYPE AND RH: 6200

## 2017-03-23 LAB — MAGNESIUM: MAGNESIUM: 2.2 mg/dL (ref 1.7–2.4)

## 2017-03-23 MED ORDER — FUROSEMIDE 10 MG/ML IJ SOLN
80.0000 mg | Freq: Two times a day (BID) | INTRAMUSCULAR | Status: DC
  Filled 2017-03-23: qty 8

## 2017-03-23 MED ORDER — SODIUM CHLORIDE 0.9 % IV SOLN
1.0000 mg/h | INTRAVENOUS | Status: DC | PRN
  Administered 2017-03-23: 2 mg/h via INTRAVENOUS
  Filled 2017-03-23 (×2): qty 10
  Filled 2017-03-23: qty 5

## 2017-03-23 MED ORDER — FUROSEMIDE 10 MG/ML IJ SOLN
40.0000 mg | Freq: Once | INTRAMUSCULAR | Status: AC
  Administered 2017-03-23: 40 mg via INTRAVENOUS
  Filled 2017-03-23: qty 4

## 2017-03-23 MED ORDER — DIPHENHYDRAMINE HCL 50 MG/ML IJ SOLN
25.0000 mg | Freq: Four times a day (QID) | INTRAMUSCULAR | Status: DC | PRN
  Administered 2017-03-23: 25 mg via INTRAVENOUS
  Filled 2017-03-23: qty 1

## 2017-03-23 MED ORDER — LORAZEPAM 1 MG PO TABS: 1.0000 mg | ORAL_TABLET | ORAL | Status: DC | PRN

## 2017-03-23 MED ORDER — DIGOXIN 0.25 MG/ML IJ SOLN
0.5000 mg | Freq: Every day | INTRAMUSCULAR | Status: DC
  Administered 2017-03-23: 0.5 mg via INTRAVENOUS
  Filled 2017-03-23: qty 2

## 2017-03-23 MED ORDER — MORPHINE SULFATE (PF) 2 MG/ML IV SOLN: 1.0000 mg | INTRAVENOUS | Status: DC | PRN

## 2017-03-23 MED ORDER — MORPHINE SULFATE (PF) 2 MG/ML IV SOLN
2.0000 mg | INTRAVENOUS | Status: DC | PRN
  Administered 2017-03-23: 2 mg via INTRAVENOUS
  Filled 2017-03-23: qty 1

## 2017-03-23 MED ORDER — LORAZEPAM 2 MG/ML PO CONC: 1.0000 mg | ORAL | Status: DC | PRN

## 2017-03-23 MED ORDER — LORAZEPAM 2 MG/ML IJ SOLN: 1.0000 mg | INTRAMUSCULAR | Status: DC | PRN

## 2017-03-23 MED ORDER — BIOTENE DRY MOUTH MT LIQD: 15.0000 mL | OROMUCOSAL | Status: DC | PRN

## 2017-03-23 MED ORDER — POLYVINYL ALCOHOL 1.4 % OP SOLN
1.0000 [drp] | Freq: Four times a day (QID) | OPHTHALMIC | Status: DC | PRN
  Filled 2017-03-23: qty 15

## 2017-03-26 ENCOUNTER — Inpatient Hospital Stay (HOSPITAL_COMMUNITY): Admission: RE | Admit: 2017-03-26 | Payer: Medicare HMO | Source: Ambulatory Visit

## 2017-03-26 ENCOUNTER — Telehealth: Payer: Self-pay

## 2017-03-26 NOTE — Telephone Encounter (Signed)
On 03/26/17 I received a death certificate from [place] (original) The death certificate is for burial. The patient is a patient of Doctor Titus Mould. The death certificate will be taken to [facility] ([address]) this pm for signature.   On 04/04/2017 I received the death certificate back from Doctor Titus Mould. I got the death certificate ready and called the funeral home to let them know the death certificate is ready for pickup.

## 2017-04-01 ENCOUNTER — Ambulatory Visit: Payer: Medicare HMO | Admitting: Neurology

## 2017-04-07 NOTE — Progress Notes (Addendum)
PULMONARY / CRITICAL CARE MEDICINE   Name: [name] MRN: [id number] DOB: [birth year]    ADMISSION DATE:  04/03/2017 CONSULTATION DATE:  04/06/2017  REFERRING MD:  Dr. Baird Cancer   CHIEF COMPLAINT:  Acute GI Bleed   HISTORY OF PRESENT ILLNESS:   83 y/o F (resides at [facility]) with PMH of Anemia, arthritis, A.Fib (on coumadin), CHF, MVR, HLD, oxygen dependent on 3L [place], CKD-4, GERD, HTN, HLD, and Breast Cancer, and duodenal mass. Presents to ED on 4/12 after being found by daughter in bed with large volume bright red blood on sheets. Upon arrival to ED was in hemorrhagic shock (hypotensive, tachycardic, with frank red blood "pouring from rectum"). In ED patient received 2L NS and 4 units PRBC, Kcentra, FFP, IV vitamin K for coumadin reversal. Started on IV Protonix ggt. GI consulted and performed EGD 4/12 which was without acute bleeding or apparent source of bleeding. Duodenal mass still present. Intubated 4/12 > 4/13, 4/14 -->  Required 6 units of blood + FFP  Of note patient admitted 4/1-4/4 for syncope secondary to orthostatic HTN and recent falls, diagnosised with E.Coli UTI 4/1 and treated with Rocephin. Contacted SNF, patient has not been on Abx since 4/5.   Interval History: Intubated. Fent, NE, Amio, Protonix drips. Family meeting yest, pt now partial code (no CPR, no shock) and family to make decision WU:[id number] care.  STUDIES:  CXR 4/12 - Satisfactory OG tube. Stable cardiomegaly.  EGD 4/12 - No blood in UGI. Duodenal mass still present, appears unchanged CT angio pelvis 4/14: Suspected HD sign narrowing of celiac and left renal arteries. Evolving blood products in rectal vault with minimal perirectal stranding. No discrete areas of contrast extravasation.  NM blood loss 4/14: No evidence of active GI bleed CXR 4/15 - Vascular congestion, mild bibasilar airspace opacities, mild interstitial edema  CULTURES: Ucx 4/1 > E.coli, RESISTANT to Bactrim Ucx 4/14 > NO  GROWTH  ANTIBIOTICS: Bactrim 4/14 > 4/15 Ceftriaxone 4/15 >>  SIGNIFICANT EVENTS: 4/12 > Presents to ED with GI bleed. Transfused 4 units 4/12 EGD without acute blood loss 4/13 continues to have BRBPR 4/13 Extubated 4/14 Intubated. Lg volume BRBPR req pressors and re-intubation. NM scan without source of acute bleeding. Transfused 2 units PRB + 2 units FFP 4/15 Continues BRBPR early AM. Transfused 2 units PRB + 1 FFP  LINES/TUBES: ETT 4/12 >> 4/13, 4/14 >> Right IJ Cortis 4/12 >>  VITAL SIGNS: BP 108/69   Pulse (!) 103   Temp 98.5 F (36.9 C) (Oral)   Resp 20   Ht 5' 4"  (1.626 m)   Wt 247 lb 12.8 oz (112.4 kg)   SpO2 100%   BMI 42.53 kg/m   VENTILATOR SETTINGS: Vent Mode: PRVC FiO2 (%):  [40 %] 40 % Set Rate:  [20 bmp] 20 bmp Vt Set:  [530 mL] 530 mL PEEP:  [5 cmH20] 5 cmH20 Plateau Pressure:  [26 cmH20-31 cmH20] 26 cmH20  INTAKE / OUTPUT: + 6.6 L since admission. + 2.5 L yest.  PHYSICAL EXAMINATION: General:  Chronically ill appearing, +ETT. Appears uncomfortable. Wiggling in bed itching. HENT: [place],AT. Pupils pinpoint but equal.  PULM: Coarse wet breath sounds CV: Irreg irreg, no mgr, rate ~120's GI: soft, nontender MSK: Extremities rigid Neuro: Sedated, does not FC  BMET  Recent Labs Lab 03/20/17 1245 03/21/17 0445 03/22/17 0215  NA 143 145 140  K 3.2* 3.3* 3.9  CL 109 111 110  CO2 27 26 20*  BUN 27* 17 20  CREATININE 1.56* 1.32* 1.74*  GLUCOSE 105* 91 269*   Electrolytes  Recent Labs Lab 03/20/17 0601 03/20/17 1245 03/21/17 0445 03/22/17 0215  CALCIUM 6.9* 7.2* 6.8* 6.9*  MG 1.9  --   --  1.6*  PHOS 1.8*  --   --   --    CBC  Recent Labs Lab 03/20/17 0601  03/21/17 1844  03/22/17 0215 03/22/17 0800 03/22/17 1748 03/22/17 2130  WBC 16.0*  --  16.5*  --  17.1*  --   --   --   HGB 9.9*  < > 7.9*  < > 7.7* 6.8* 8.7* 8.7*  HCT 30.2*  < > 23.9*  < > 23.2* 19.9* 25.6* 25.5*  PLT 115*  --  74*  --  78*  --   --   --   < > = values in  this interval not displayed. Coag's  Recent Labs Lab 03/26/2017 1438  03/21/17 0446 03/21/17 1118 03/22/17 0740  APTT 29  --   --   --   --   INR 1.16  < > 1.31 1.39 1.72  < > = values in this interval not displayed. Sepsis Markers  Recent Labs Lab 03/08/2017 0030 03/17/2017 1448 03/27/2017 1720  LATICACIDVEN 1.6 3.12* 1.4   ABG  Recent Labs Lab 03/18/2017 1925 03/21/17 1850  PHART 7.409 7.379  PCO2ART 43.9 32.6  PO2ART 551.0* 488*   Liver Enzymes  Recent Labs Lab 03/20/17 1245 03/21/17 0445  AST 18 16  ALT 7* 8*  ALKPHOS 70 62  BILITOT 1.0 0.7  ALBUMIN 2.1* 1.9*    Cardiac Enzymes  Recent Labs Lab 03/08/2017 1720 03/20/17 0601  TROPONINI 0.03* 0.07*   Glucose  Recent Labs Lab 03/22/17 0736 03/22/17 1153 03/22/17 1612 03/22/17 2023 2017/04/21 0036 2017-04-21 0420  GLUCAP 183* 151* 138* 102* 106* 113*   Imaging No results found. DISCUSSION: 83 year old female on coumadin with known duodenal mass presents with active GI bleed. Intubated in ED + s/p transfusion 7 units. EGD without active bleeding, did show stable duodenal mass without ulceration.   ASSESSMENT / PLAN:  PULMONARY A: Acute on Chronic Hypoxic Respiratory Failure (on home 3L [place]). Reintubated 4/14 Lungs suspicious for pulm edema P:  CXR IV Lasix 40 x1  Maintain O2 saturation > 90% Wean as tolerated - currently req full vent support  CARDIOVASCULAR A: Hemorrhagic Shock secondary to active GI bleed Demand ischemia H/O HLD, HTN,  A.fib, intermittently with RVR P:  Still requiring pressors, more than yest. NE Tele Amio bolus + continuous (60 mg/hr) PRN Metop for sustained tachy >110, none needed ON.  RENAL A:   Acute on chronic renal failure, Cr 1.7 yest.  Oliguric. 490 mLs urine yest P:   Mag and renal fxn pend KVO fluids Monitor BMET and UOP Replace electrolytes as needed, Mg and K repleted yest(2g IV mag, KCl 30 mEq)  GASTROINTESTINAL A:   GI Bleed> source uncertain. NM  scan neg for source. CTA also without source of active bleed although did demonstrate evolving collection of blood products in rectum. Also with ?occlusion of celiac and left renal artery H/O GERD, Duodenal mass  P:   GI signed off as doesn't appear to be much from GI perspective that can be done currently.  NPO Protonix GTT H&H Q6H  HEMATOLOGIC A:   Hemorrhagic anemia, Hb 8.7. P:  Transfuse for Hgb < 7gm/dL H&H Q6H  INFECTIOUS A:   UTI > persistent pyuria. Bactrim started 4/14 however -->  IV Ceftriaxone as culture from 2 weeks ago negative. Urine culture without growth however was collected after abx.  Remains afebrile P:   Ceftriaxone, day 2/3. Consider dc  ENDOCRINE A:   Hyperglycemia    P:   SSI Q4H glucose checks   NEUROLOGIC A:   Sedated  P:   Fent, Versed PRN  FAMILY  -Updates: Met with many members of patients family. Family in agreement that further life support at her advanced age with multiple medical comorbidities continues to contribute to her ongoing weakness and decreases likelihood of meaningful recovery/quality of life. Patient was made PARTIAL CODE (NO CPR or shocks in the event of cardiac arrest). Family did however wish to continue this current level of care for another day to see if source of bleeding can be localized and embolized.  Family in agreement that if no improvement or definitive treatment by today, will likely withdraw care.   Bethany Molt, DO  STAFF NOTE: I, Merrie Roof, MD FACP have personally reviewed patient's available data, including medical history, events of note, physical examination and test results as part of my evaluation. I have discussed with resident/NP and other care providers such as pharmacist, RN and RRT. In addition, I personally evaluated patient and elicited key findings of: awakens, agitation svere, tachycardia , irregualr, lungs crackles coarse, abdo soft, perrl small 2 mm, limited edema, overnight with small  bloody BM, BP is better overall, she does not follow commands, , d/w dr [place] noted and I agree that she is unlikely to survive this hospital stay and likely to suffer continue complications and is suffering, she has ATN after large volume bleed and I am concerned also for CVA watershed with her neurostatus, goal is to wean levo to off , map goal 60, lasix to neg 500 cc balance with int changes in pcxr and received prbc and pos balance, dig x 1, goal to dc amio, with age and crt will need higher lasix dosing, weaning now PS 12, will reduce PS to 5, assess rr, and need for morphine is comfort care selected, if extubated she is likley to breath okay , will have stand by morphine, dc abx, unlikely to have symptomatic uti, will finalize family wishes for comfort approach The patient is critically ill with multiple organ systems failure and requires high complexity decision making for assessment and support, frequent evaluation and titration of therapies, application of advanced monitoring technologies and extensive interpretation of multiple databases.   Critical Care Time devoted to patient care services described in this note is 30 Minutes. This time reflects time of care of this signee: Merrie Roof, MD FACP. This critical care time does not reflect procedure time, or teaching time or supervisory time of PA/NP/Med student/Med Resident etc but could involve care discussion time. Rest per NP/medical resident whose note is outlined above and that I agree with   Lavon Paganini. Titus Mould, MD, [place] Pgr: [facility] 2017-04-12 8:23 AM

## 2017-04-07 NOTE — Progress Notes (Signed)
   Met w/ family and patient '@bedside'$ .  Offered prayer.  Will follow, as needed.  - Rev. [place] MDiv ThM

## 2017-04-07 NOTE — Progress Notes (Signed)
MD at the bedside , talking to the family about the plan

## 2017-04-07 NOTE — Plan of Care (Signed)
Critical Care Interval Update  I met with the patients extended family again today at approximately 12 pm. They agree that the patient has made a little improvement overnight but overall still appears uncomfortable and weak. We again discussed the fact that [name] would not have survival with meaningful outcome should she undergo CPR or shocks and that such interventions would not add quality to her life. They are in agreement with one-way extubation this afternoon. The family was unified in their decision.   After discussion we have decided: -NO CPR or shocks in the event of cardiac arrest -One-way extubation at 2pm today with the understanding that if patient were unable to breath on her own, we would transition to full comfort care -If the patient were to again develop large volume GIB, there would not be any further transfusion of blood products and she would be transitioned to comfort care.  -Family will be present during extubation -Chaplain contacted to provide family additional support -I will also be present to provide additional support  [name], D.O.  Internal Medicine - PGY1

## 2017-04-07 NOTE — Progress Notes (Signed)
Pt's family at the bedside. Pt extubated. Morphine PRN was given. Seems comfortable. Chaplin at the bedside

## 2017-04-07 NOTE — Progress Notes (Signed)
Comfort measures orders are in. Morphine drip started for air hanger. Family left. Pt has orders for Med Surg. Family will be notify when bed is ready.

## 2017-04-07 NOTE — Progress Notes (Signed)
Pt CBC was done around 6. No HH will be drawn at 8 am. No blood return in any ports noted, MD aware

## 2017-04-07 NOTE — Procedures (Signed)
Extubation Procedure Note  Patient Details:   Name: [name] DOB: 1926/01/11 MRN: [id number]   Airway Documentation:  Airway 7.5 mm (Active)  Secured at (cm) 24 cm 04-Apr-2017 11:28 AM  Measured From Lips 04-Apr-2017 11:28 AM  [place] 04/04/17 11:28 AM  Secured By [employer] Apr 04, 2017 11:28 AM  Tube Holder Repositioned Yes April 04, 2017 11:28 AM  Cuff Pressure (cm H2O) 30 cm H2O 03/22/2017 11:29 PM  Site Condition Dry 04/04/17 11:28 AM    Evaluation  O2 sats: stable throughout Complications: No apparent complications Patient did tolerate procedure well. Bilateral Breath Sounds: Clear, Diminished   No   Patient was extubated to a 4L [place]. Cuff leak was heard. No stridor was noted. Family and RN were in the room with RT during extubation.  Tiburcio Bash Apr 04, 2017, 2:21 PM

## 2017-04-07 NOTE — Progress Notes (Signed)
Family at the bedside.

## 2017-04-07 NOTE — Progress Notes (Signed)
Report was given to the floor. New orders were in. Report was given for night shift RN as well . Upon reassessment pt found not breathing,heart and lungs were auscultated, Pupils checked and dilated/nonreactive, with another nurse Margaretmary Bayley. Witnessed Morphine 240 ml was wasted in the sink with same nurse as well. Prior Fentany 264ml was wasted in the sink and witnessed by Lilian Kapur, RN. Family called and updated. Elink called and updated.Organ donation called as well.

## 2017-04-07 NOTE — Plan of Care (Signed)
Problem: Education: Goal: Knowledge of [facility] Education information/materials will improve Outcome: Progressing Family educated and updated.Pt does not follow commands

## 2017-04-07 DEATH — deceased

## 2017-04-09 ENCOUNTER — Telehealth: Payer: Self-pay | Admitting: Internal Medicine

## 2017-04-09 NOTE — Telephone Encounter (Signed)
Error for medical rec.[name]'

## 2017-05-08 NOTE — Death Summary Note (Signed)
  Name: [name] MRN: [id number] DOB: Jul 23, 1926 83 y.o.  Date of Admission: 03/30/2017  1:27 PM Date of Discharge: 2017-03-29 Attending Physician: Cherene Altes, MD  Discharge Diagnosis: Active Problems:   Acute GI bleeding   Ventilator dependent ([place])   Acute respiratory failure with hypoxia ([place])   Acute on chronic respiratory failure with hypoxemia ([facility])   Encounter for intubation   Cause of death: Complications of hemorrhagic shock Time of death: 03/29/17, 7:27pm  Disposition and follow-up:   [name] was discharged from [facility] in expired condition.    Hospital Course: 61 SNF patient with Mhx significant for chronic anticoagulation (coumadin for Afib and DVT), chronic oxygen dependent respiratory failure, CKD4, hx of breast cancer, duodenal mass with history of duodenal bleeding. Presented from [facility] 03/25/2017 after being found in bed with large volume BRB on sheets. She was in hemorrhagic shock upon arrival to ED (hypotensive, tachycardic with frank red blood "pouring from rectum"). She received 6 units of blood + FFP + Kcentra and IV vitamin K for coumadin reversal although INR subtherapeutic on admission. GI consulted who performed EGD which was without source of bleeding. Nuclear RBC scan without source of bleeding however she continued to have multiple large volume BRBPR. She continued to require pressor support, had minimal interaction without sedation and failed extubation several times. Good conversation was had with extended family regarding prognosis and inability to locate source of bleed as well as the patients wishes. They note she has been declining over the past year and would not want to be kept on life-support long-term. With this in mind, she was one-way extubated Mar 29, 2017 with family and chaplain at bedside. She received morphine for air hunger and passed within the next few hours.  SignedEinar Gip, DO 04/21/2017, 11:40  AM

## 2017-10-01 IMAGING — CT CT HEAD W/O CM
3 of 4 series · 18 of 47 positions shown, 21 images · non-contrast
Comparison: 05/19/2015

CLINICAL DATA: Generalized weakness.  Fall.

EXAM:
CT HEAD WITHOUT CONTRAST
TECHNIQUE: Contiguous axial images were obtained from the base of the skull
through the vertex without intravenous contrast.

[Series 201: head w/o, idose (1) · axial · non-contrast · 0.42mm/px · z∈[-186,-66]mm · 12 of 29 slices shown, 15 images]
[im 3/29  brain]
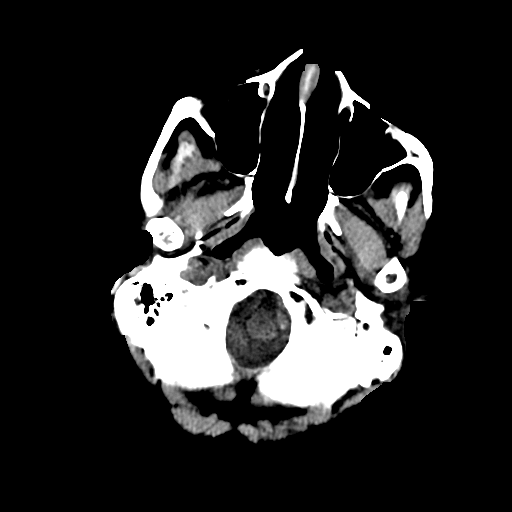
[im 3/29  bone]
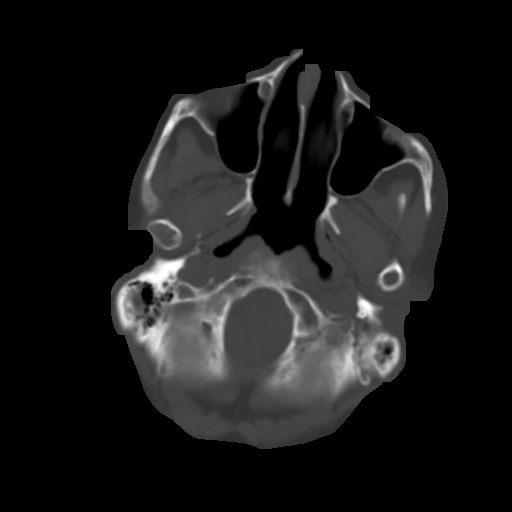
[im 5/29  brain]
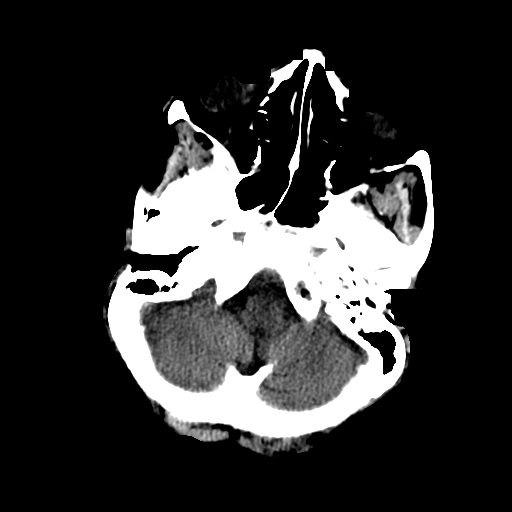
[im 7/29  brain]
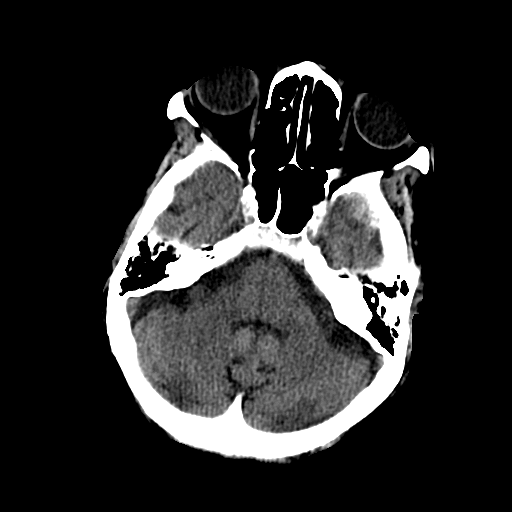
[im 9/29  brain]
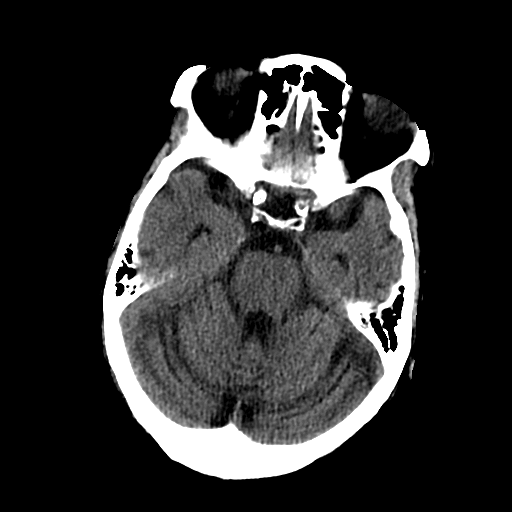
[im 11/29  brain]
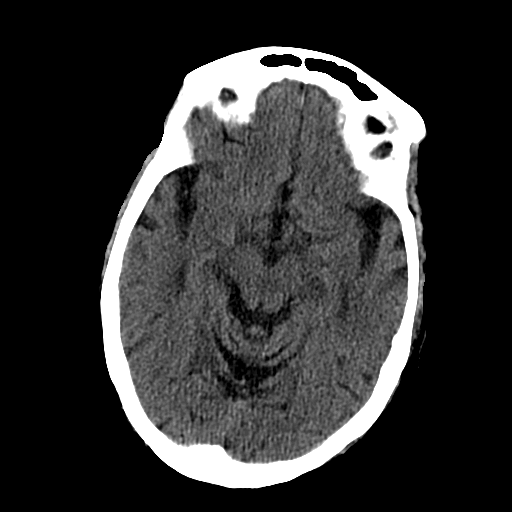
[im 11/29  bone]
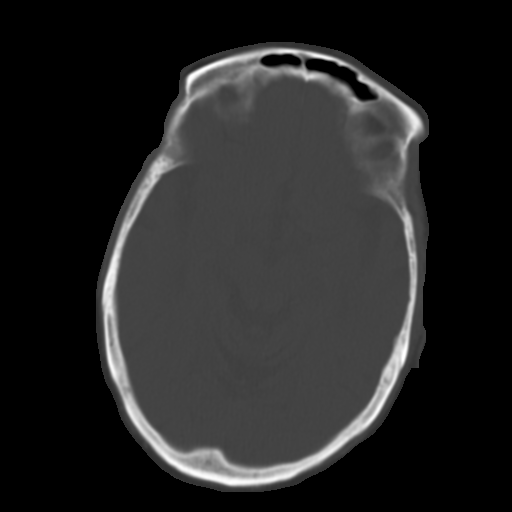
[im 13/29  brain]
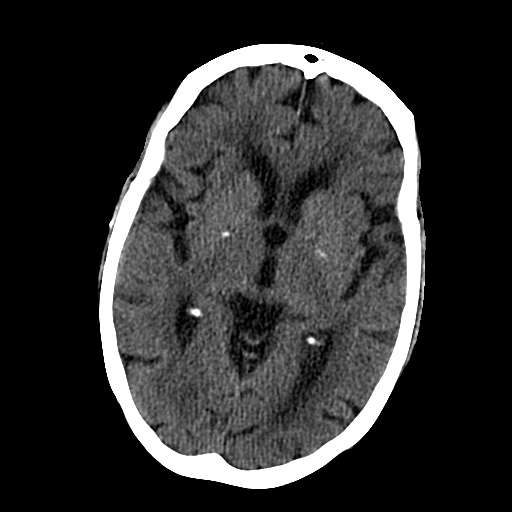
[im 17/29  brain]
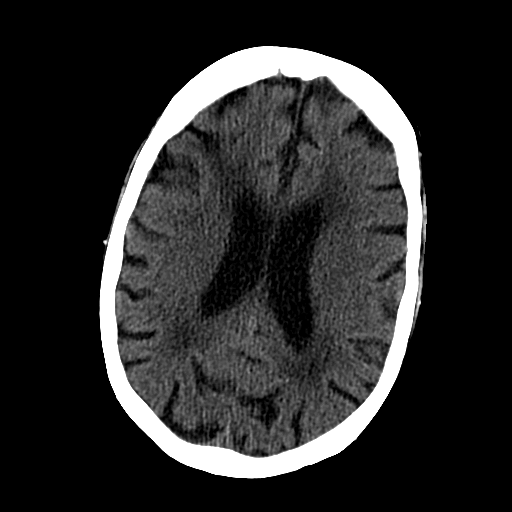
[im 19/29  brain]
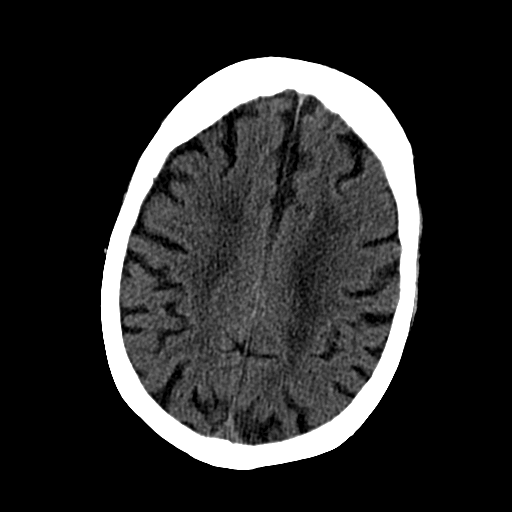
[im 21/29  brain]
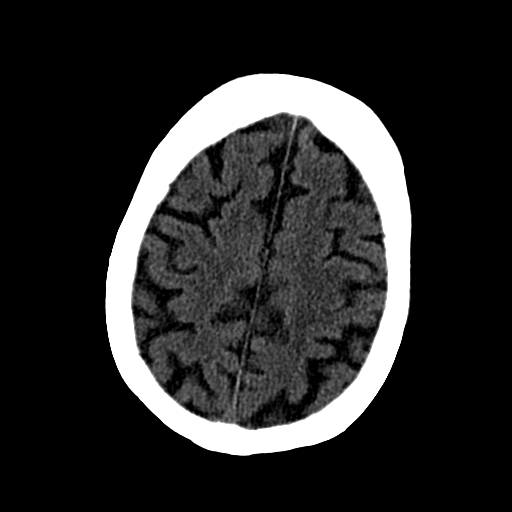
[im 21/29  bone]
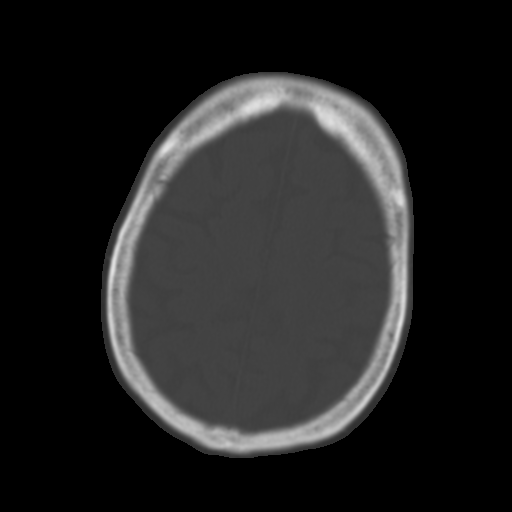
[im 23/29  brain]
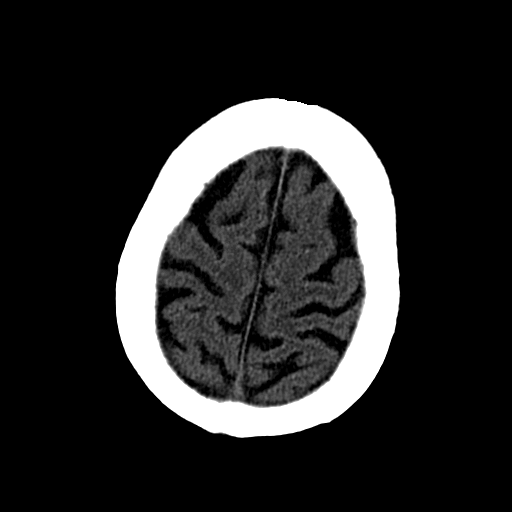
[im 25/29  brain]
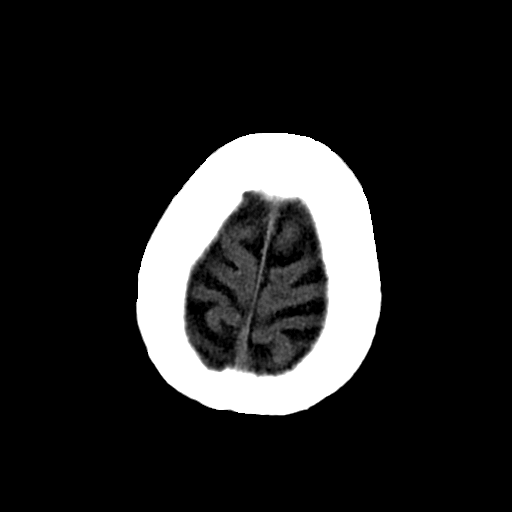
[im 27/29  brain]
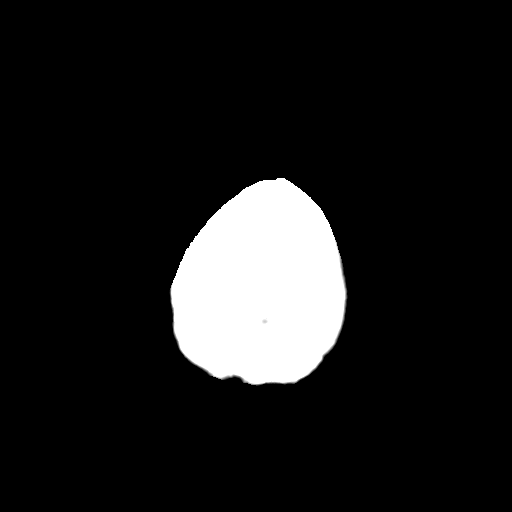

[Series 204: sagittal st, idose (1) · sagittal · 0.40mm/px · 3 of 59 slices shown]
[im 20/59  brain]
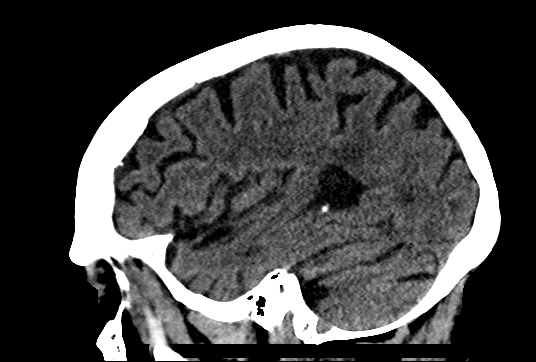
[im 30/59  brain]
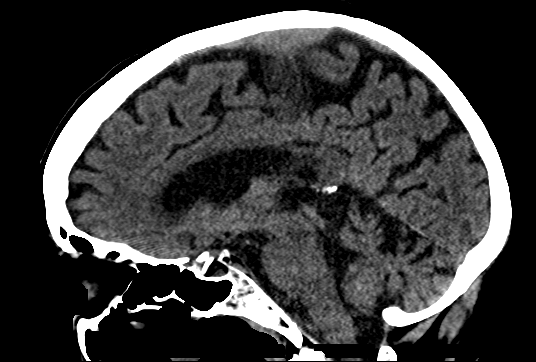
[im 39/59  brain]
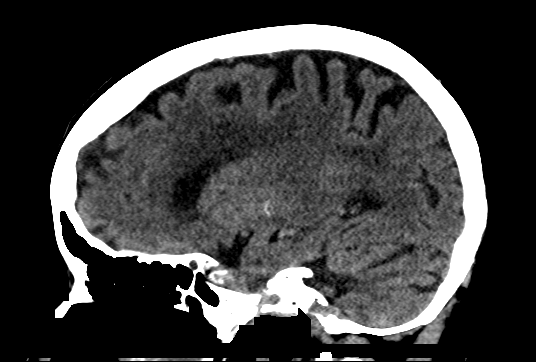

[Series 205: coronal st, idose (1) · coronal · 0.40mm/px · 3 of 64 slices shown]
[im 22/64  brain]
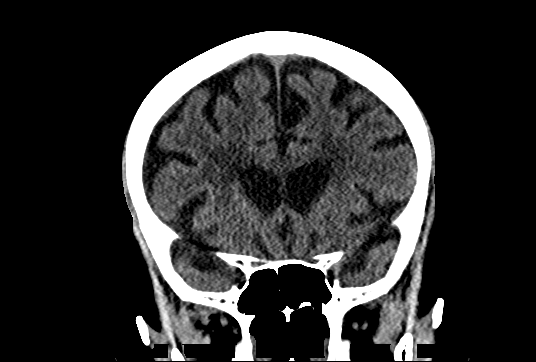
[im 29/64  brain]
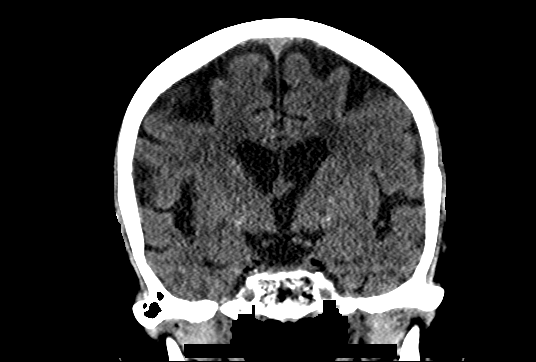
[im 36/64  brain]
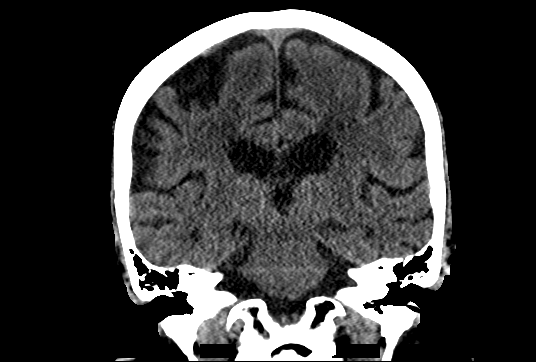

[18 of 47 positions shown; findings below may reference images not displayed]

FINDINGS: Brain: No evidence of acute infarction, hemorrhage, hydrocephalus,
extra-axial collection or mass lesion/mass effect. There is patchy
low attenuation throughout the subcortical and periventricular white
matter compatible with chronic small vessel ischemic disease.
Prominence of the sulci and ventricles noted compatible with brain
atrophy.

Vascular: No hyperdense vessel or unexpected calcification.

Skull: Normal. Negative for fracture or focal lesion.

Sinuses/Orbits: The paranasal sinuses are clear. There is partial
opacification of the right mastoid air cells.

Other: None.
IMPRESSION: 1. Small vessel ischemic disease brain atrophy.
2. No acute intracranial abnormalities.
